# Patient Record
Sex: Male | Born: 1950
Health system: Southern US, Community
[De-identification: ages and names within clinical notes are randomized; demographics above are authoritative.]

## PROBLEM LIST (undated history)

## (undated) DIAGNOSIS — R0600 Dyspnea, unspecified: Secondary | ICD-10-CM

## (undated) DIAGNOSIS — J449 Chronic obstructive pulmonary disease, unspecified: Secondary | ICD-10-CM

## (undated) DIAGNOSIS — E785 Hyperlipidemia, unspecified: Secondary | ICD-10-CM

## (undated) DIAGNOSIS — K432 Incisional hernia without obstruction or gangrene: Secondary | ICD-10-CM

## (undated) DIAGNOSIS — I5030 Unspecified diastolic (congestive) heart failure: Secondary | ICD-10-CM

## (undated) DIAGNOSIS — F419 Anxiety disorder, unspecified: Secondary | ICD-10-CM

## (undated) DIAGNOSIS — M1990 Unspecified osteoarthritis, unspecified site: Secondary | ICD-10-CM

## (undated) DIAGNOSIS — N400 Enlarged prostate without lower urinary tract symptoms: Secondary | ICD-10-CM

## (undated) DIAGNOSIS — I10 Essential (primary) hypertension: Secondary | ICD-10-CM

## (undated) DIAGNOSIS — J9611 Chronic respiratory failure with hypoxia: Secondary | ICD-10-CM

## (undated) DIAGNOSIS — E039 Hypothyroidism, unspecified: Secondary | ICD-10-CM

## (undated) HISTORY — DX: Unspecified osteoarthritis, unspecified site: M19.90

## (undated) HISTORY — PX: TONSILLECTOMY: SUR1361

## (undated) HISTORY — DX: Essential (primary) hypertension: I10

## (undated) HISTORY — DX: Dyspnea, unspecified: R06.00

## (undated) HISTORY — DX: Chronic obstructive pulmonary disease, unspecified: J44.9

## (undated) HISTORY — DX: Hyperlipidemia, unspecified: E78.5

## (undated) HISTORY — PX: ROTATOR CUFF REPAIR: SHX139

## (undated) HISTORY — PX: EXPLORATORY LAPAROTOMY: SUR591

## (undated) HISTORY — DX: Hypothyroidism, unspecified: E03.9

## (undated) HISTORY — DX: Anxiety disorder, unspecified: F41.9

## (undated) HISTORY — DX: Unspecified diastolic (congestive) heart failure: I50.30

## (undated) HISTORY — DX: Benign prostatic hyperplasia without lower urinary tract symptoms: N40.0

## (undated) HISTORY — PX: CARPAL TUNNEL RELEASE: SHX101

## (undated) HISTORY — DX: Chronic respiratory failure with hypoxia: J96.11

---
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
## 2001-01-20 ENCOUNTER — Encounter: Admission: RE | Admit: 2001-01-20 | Discharge: 2001-02-07 | Payer: Self-pay | Admitting: Occupational Medicine

## 2002-04-27 ENCOUNTER — Encounter: Payer: Self-pay | Admitting: Orthopedic Surgery

## 2002-04-27 ENCOUNTER — Ambulatory Visit (HOSPITAL_COMMUNITY): Admission: RE | Admit: 2002-04-27 | Discharge: 2002-04-27 | Payer: Self-pay | Admitting: Orthopedic Surgery

## 2002-05-04 ENCOUNTER — Encounter: Payer: Self-pay | Admitting: Orthopedic Surgery

## 2002-05-10 ENCOUNTER — Observation Stay (HOSPITAL_COMMUNITY): Admission: RE | Admit: 2002-05-10 | Discharge: 2002-05-11 | Payer: Self-pay | Admitting: Orthopedic Surgery

## 2002-05-16 ENCOUNTER — Encounter: Payer: Self-pay | Admitting: Orthopedic Surgery

## 2002-05-16 ENCOUNTER — Ambulatory Visit (HOSPITAL_COMMUNITY): Admission: RE | Admit: 2002-05-16 | Discharge: 2002-05-16 | Payer: Self-pay | Admitting: Orthopedic Surgery

## 2004-08-18 ENCOUNTER — Emergency Department (HOSPITAL_COMMUNITY): Admission: EM | Admit: 2004-08-18 | Discharge: 2004-08-18 | Payer: Self-pay | Admitting: Emergency Medicine

## 2004-08-30 ENCOUNTER — Encounter: Admission: RE | Admit: 2004-08-30 | Discharge: 2004-08-30 | Payer: Self-pay | Admitting: *Deleted

## 2005-02-25 ENCOUNTER — Ambulatory Visit (HOSPITAL_COMMUNITY): Admission: RE | Admit: 2005-02-25 | Discharge: 2005-02-25 | Payer: Self-pay | Admitting: Orthopedic Surgery

## 2005-12-13 HISTORY — PX: TOE SURGERY: SHX1073

## 2006-05-30 ENCOUNTER — Encounter: Admission: RE | Admit: 2006-05-30 | Discharge: 2006-05-30 | Payer: Self-pay | Admitting: Orthopedic Surgery

## 2006-05-31 ENCOUNTER — Ambulatory Visit (HOSPITAL_BASED_OUTPATIENT_CLINIC_OR_DEPARTMENT_OTHER): Admission: RE | Admit: 2006-05-31 | Discharge: 2006-05-31 | Payer: Self-pay | Admitting: Orthopedic Surgery

## 2007-12-10 ENCOUNTER — Emergency Department (HOSPITAL_COMMUNITY): Admission: EM | Admit: 2007-12-10 | Discharge: 2007-12-10 | Payer: Self-pay | Admitting: Emergency Medicine

## 2007-12-14 HISTORY — PX: REPLACEMENT TOTAL KNEE: SUR1224

## 2007-12-29 ENCOUNTER — Ambulatory Visit: Payer: Self-pay | Admitting: Pulmonary Disease

## 2007-12-29 ENCOUNTER — Inpatient Hospital Stay (HOSPITAL_COMMUNITY): Admission: RE | Admit: 2007-12-29 | Discharge: 2008-01-02 | Payer: Self-pay | Admitting: Orthopedic Surgery

## 2008-01-26 ENCOUNTER — Ambulatory Visit: Payer: Self-pay | Admitting: Pulmonary Disease

## 2008-01-26 ENCOUNTER — Telehealth: Payer: Self-pay | Admitting: Pulmonary Disease

## 2008-01-26 DIAGNOSIS — E039 Hypothyroidism, unspecified: Secondary | ICD-10-CM

## 2008-01-26 DIAGNOSIS — J310 Chronic rhinitis: Secondary | ICD-10-CM | POA: Insufficient documentation

## 2008-01-26 DIAGNOSIS — K219 Gastro-esophageal reflux disease without esophagitis: Secondary | ICD-10-CM | POA: Insufficient documentation

## 2008-01-26 DIAGNOSIS — I10 Essential (primary) hypertension: Secondary | ICD-10-CM | POA: Insufficient documentation

## 2008-01-26 DIAGNOSIS — J449 Chronic obstructive pulmonary disease, unspecified: Secondary | ICD-10-CM

## 2008-02-09 ENCOUNTER — Encounter: Payer: Self-pay | Admitting: Pulmonary Disease

## 2008-05-17 ENCOUNTER — Telehealth (INDEPENDENT_AMBULATORY_CARE_PROVIDER_SITE_OTHER): Payer: Self-pay | Admitting: *Deleted

## 2008-08-14 ENCOUNTER — Telehealth: Payer: Self-pay | Admitting: Pulmonary Disease

## 2008-10-21 ENCOUNTER — Telehealth: Payer: Self-pay | Admitting: Pulmonary Disease

## 2008-10-23 ENCOUNTER — Ambulatory Visit: Payer: Self-pay | Admitting: Pulmonary Disease

## 2008-10-23 DIAGNOSIS — F17200 Nicotine dependence, unspecified, uncomplicated: Secondary | ICD-10-CM

## 2008-10-23 DIAGNOSIS — G4733 Obstructive sleep apnea (adult) (pediatric): Secondary | ICD-10-CM

## 2008-11-27 ENCOUNTER — Encounter: Payer: Self-pay | Admitting: Pulmonary Disease

## 2009-08-08 ENCOUNTER — Encounter: Payer: Self-pay | Admitting: Pulmonary Disease

## 2009-08-29 ENCOUNTER — Telehealth (INDEPENDENT_AMBULATORY_CARE_PROVIDER_SITE_OTHER): Payer: Self-pay | Admitting: *Deleted

## 2009-09-10 ENCOUNTER — Ambulatory Visit: Payer: Self-pay | Admitting: Pulmonary Disease

## 2009-09-10 DIAGNOSIS — J9611 Chronic respiratory failure with hypoxia: Secondary | ICD-10-CM

## 2009-12-09 ENCOUNTER — Ambulatory Visit: Payer: Self-pay | Admitting: Internal Medicine

## 2009-12-09 ENCOUNTER — Telehealth (INDEPENDENT_AMBULATORY_CARE_PROVIDER_SITE_OTHER): Payer: Self-pay | Admitting: *Deleted

## 2009-12-10 ENCOUNTER — Encounter: Payer: Self-pay | Admitting: Internal Medicine

## 2009-12-11 ENCOUNTER — Telehealth (INDEPENDENT_AMBULATORY_CARE_PROVIDER_SITE_OTHER): Payer: Self-pay | Admitting: *Deleted

## 2009-12-15 ENCOUNTER — Telehealth: Payer: Self-pay | Admitting: Pulmonary Disease

## 2009-12-16 ENCOUNTER — Ambulatory Visit: Payer: Self-pay | Admitting: Pulmonary Disease

## 2009-12-16 ENCOUNTER — Telehealth (INDEPENDENT_AMBULATORY_CARE_PROVIDER_SITE_OTHER): Payer: Self-pay | Admitting: *Deleted

## 2009-12-17 ENCOUNTER — Encounter: Payer: Self-pay | Admitting: Pulmonary Disease

## 2009-12-24 ENCOUNTER — Ambulatory Visit: Payer: Self-pay | Admitting: Pulmonary Disease

## 2009-12-25 ENCOUNTER — Ambulatory Visit: Payer: Self-pay | Admitting: Cardiology

## 2009-12-25 ENCOUNTER — Ambulatory Visit: Admission: RE | Admit: 2009-12-25 | Discharge: 2009-12-25 | Payer: Self-pay | Admitting: Pulmonary Disease

## 2009-12-25 ENCOUNTER — Encounter: Payer: Self-pay | Admitting: Pulmonary Disease

## 2009-12-26 LAB — CONVERTED CEMR LAB
ALT: 32 units/L (ref 0–53)
AST: 20 units/L (ref 0–37)
Alkaline Phosphatase: 82 units/L (ref 39–117)
Calcium: 9.6 mg/dL (ref 8.4–10.5)
Eosinophils Relative: 1.5 % (ref 0.0–5.0)
GFR calc non Af Amer: 105.78 mL/min (ref >60)
HCT: 47.8 % (ref 39.0–52.0)
Hemoglobin: 15.5 g/dL (ref 13.0–17.0)
Lymphocytes Relative: 24 % (ref 12.0–46.0)
Lymphs Abs: 1.9 10*3/uL (ref 0.7–4.0)
Monocytes Relative: 6.3 % (ref 3.0–12.0)
Neutro Abs: 5.2 10*3/uL (ref 1.4–7.7)
Platelets: 204 10*3/uL (ref 150.0–400.0)
Potassium: 4 meq/L (ref 3.5–5.1)
Sodium: 139 meq/L (ref 135–145)
TSH: 6.88 microintl units/mL — ABNORMAL HIGH (ref 0.35–5.50)
Total Bilirubin: 0.6 mg/dL (ref 0.3–1.2)
WBC: 7.8 10*3/uL (ref 4.5–10.5)

## 2009-12-29 DIAGNOSIS — M1990 Unspecified osteoarthritis, unspecified site: Secondary | ICD-10-CM | POA: Insufficient documentation

## 2009-12-31 ENCOUNTER — Ambulatory Visit: Payer: Self-pay | Admitting: Cardiovascular Disease

## 2009-12-31 ENCOUNTER — Encounter: Payer: Self-pay | Admitting: Adult Health

## 2009-12-31 ENCOUNTER — Ambulatory Visit: Payer: Self-pay

## 2009-12-31 ENCOUNTER — Ambulatory Visit: Payer: Self-pay | Admitting: Internal Medicine

## 2009-12-31 DIAGNOSIS — R002 Palpitations: Secondary | ICD-10-CM | POA: Insufficient documentation

## 2010-01-20 ENCOUNTER — Ambulatory Visit: Payer: Self-pay | Admitting: Pulmonary Disease

## 2010-02-13 ENCOUNTER — Telehealth (INDEPENDENT_AMBULATORY_CARE_PROVIDER_SITE_OTHER): Payer: Self-pay | Admitting: *Deleted

## 2010-02-16 ENCOUNTER — Telehealth (INDEPENDENT_AMBULATORY_CARE_PROVIDER_SITE_OTHER): Payer: Self-pay | Admitting: *Deleted

## 2010-02-16 ENCOUNTER — Encounter (INDEPENDENT_AMBULATORY_CARE_PROVIDER_SITE_OTHER): Payer: Self-pay | Admitting: *Deleted

## 2010-02-16 ENCOUNTER — Ambulatory Visit: Payer: Self-pay | Admitting: Cardiovascular Disease

## 2010-03-03 ENCOUNTER — Ambulatory Visit: Payer: Self-pay | Admitting: Pulmonary Disease

## 2010-03-19 ENCOUNTER — Telehealth (INDEPENDENT_AMBULATORY_CARE_PROVIDER_SITE_OTHER): Payer: Self-pay | Admitting: *Deleted

## 2010-03-26 ENCOUNTER — Encounter (HOSPITAL_COMMUNITY): Admission: RE | Admit: 2010-03-26 | Discharge: 2010-06-24 | Payer: Self-pay | Admitting: Pulmonary Disease

## 2010-04-07 ENCOUNTER — Encounter: Payer: Self-pay | Admitting: Pulmonary Disease

## 2010-04-17 ENCOUNTER — Telehealth (INDEPENDENT_AMBULATORY_CARE_PROVIDER_SITE_OTHER): Payer: Self-pay | Admitting: *Deleted

## 2010-05-04 ENCOUNTER — Ambulatory Visit: Payer: Self-pay | Admitting: Pulmonary Disease

## 2010-05-04 DIAGNOSIS — B370 Candidal stomatitis: Secondary | ICD-10-CM | POA: Insufficient documentation

## 2010-05-14 ENCOUNTER — Telehealth (INDEPENDENT_AMBULATORY_CARE_PROVIDER_SITE_OTHER): Payer: Self-pay | Admitting: *Deleted

## 2010-05-22 ENCOUNTER — Telehealth (INDEPENDENT_AMBULATORY_CARE_PROVIDER_SITE_OTHER): Payer: Self-pay | Admitting: *Deleted

## 2010-05-28 ENCOUNTER — Telehealth (INDEPENDENT_AMBULATORY_CARE_PROVIDER_SITE_OTHER): Payer: Self-pay | Admitting: *Deleted

## 2010-06-10 ENCOUNTER — Telehealth (INDEPENDENT_AMBULATORY_CARE_PROVIDER_SITE_OTHER): Payer: Self-pay | Admitting: *Deleted

## 2010-06-11 ENCOUNTER — Telehealth (INDEPENDENT_AMBULATORY_CARE_PROVIDER_SITE_OTHER): Payer: Self-pay | Admitting: *Deleted

## 2010-06-17 ENCOUNTER — Ambulatory Visit: Payer: Self-pay | Admitting: Pulmonary Disease

## 2010-06-17 ENCOUNTER — Encounter: Payer: Self-pay | Admitting: Adult Health

## 2010-06-23 ENCOUNTER — Encounter: Payer: Self-pay | Admitting: Pulmonary Disease

## 2010-06-25 ENCOUNTER — Telehealth: Payer: Self-pay | Admitting: Pulmonary Disease

## 2010-06-25 ENCOUNTER — Encounter (HOSPITAL_COMMUNITY): Admission: RE | Admit: 2010-06-25 | Discharge: 2010-07-27 | Payer: Self-pay | Admitting: Pulmonary Disease

## 2010-06-25 ENCOUNTER — Encounter: Payer: Self-pay | Admitting: Pulmonary Disease

## 2010-07-03 ENCOUNTER — Encounter: Payer: Self-pay | Admitting: Pulmonary Disease

## 2010-07-06 ENCOUNTER — Ambulatory Visit: Payer: Self-pay | Admitting: Pulmonary Disease

## 2010-07-28 ENCOUNTER — Encounter (HOSPITAL_COMMUNITY): Admission: RE | Admit: 2010-07-28 | Discharge: 2010-09-11 | Payer: Self-pay | Admitting: Pulmonary Disease

## 2010-08-04 ENCOUNTER — Encounter: Payer: Self-pay | Admitting: Pulmonary Disease

## 2010-08-05 ENCOUNTER — Telehealth: Payer: Self-pay | Admitting: Pulmonary Disease

## 2010-08-07 ENCOUNTER — Encounter: Payer: Self-pay | Admitting: Pulmonary Disease

## 2010-08-20 ENCOUNTER — Telehealth (INDEPENDENT_AMBULATORY_CARE_PROVIDER_SITE_OTHER): Payer: Self-pay | Admitting: *Deleted

## 2010-08-21 ENCOUNTER — Ambulatory Visit (HOSPITAL_COMMUNITY)
Admission: RE | Admit: 2010-08-21 | Discharge: 2010-08-21 | Payer: Self-pay | Source: Home / Self Care | Admitting: Orthopedic Surgery

## 2010-08-31 ENCOUNTER — Encounter: Payer: Self-pay | Admitting: Pulmonary Disease

## 2010-09-04 ENCOUNTER — Ambulatory Visit: Payer: Self-pay | Admitting: Pulmonary Disease

## 2010-09-07 ENCOUNTER — Telehealth (INDEPENDENT_AMBULATORY_CARE_PROVIDER_SITE_OTHER): Payer: Self-pay | Admitting: *Deleted

## 2010-09-08 ENCOUNTER — Encounter: Payer: Self-pay | Admitting: Pulmonary Disease

## 2010-09-15 ENCOUNTER — Telehealth (INDEPENDENT_AMBULATORY_CARE_PROVIDER_SITE_OTHER): Payer: Self-pay | Admitting: *Deleted

## 2010-10-08 ENCOUNTER — Telehealth: Payer: Self-pay | Admitting: Pulmonary Disease

## 2010-10-22 ENCOUNTER — Telehealth (INDEPENDENT_AMBULATORY_CARE_PROVIDER_SITE_OTHER): Payer: Self-pay | Admitting: *Deleted

## 2010-10-23 ENCOUNTER — Ambulatory Visit: Payer: Self-pay | Admitting: Pulmonary Disease

## 2010-10-26 ENCOUNTER — Telehealth: Payer: Self-pay | Admitting: Pulmonary Disease

## 2010-12-01 ENCOUNTER — Ambulatory Visit: Payer: Self-pay | Admitting: Pulmonary Disease

## 2010-12-10 ENCOUNTER — Telehealth (INDEPENDENT_AMBULATORY_CARE_PROVIDER_SITE_OTHER): Payer: Self-pay | Admitting: *Deleted

## 2010-12-21 ENCOUNTER — Telehealth (INDEPENDENT_AMBULATORY_CARE_PROVIDER_SITE_OTHER): Payer: Self-pay | Admitting: *Deleted

## 2010-12-22 ENCOUNTER — Ambulatory Visit
Admission: RE | Admit: 2010-12-22 | Discharge: 2010-12-22 | Payer: Self-pay | Source: Home / Self Care | Attending: Adult Health | Admitting: Adult Health

## 2010-12-25 ENCOUNTER — Telehealth (INDEPENDENT_AMBULATORY_CARE_PROVIDER_SITE_OTHER): Payer: Self-pay | Admitting: *Deleted

## 2010-12-25 ENCOUNTER — Ambulatory Visit
Admission: RE | Admit: 2010-12-25 | Discharge: 2010-12-25 | Payer: Self-pay | Source: Home / Self Care | Attending: Pulmonary Disease | Admitting: Pulmonary Disease

## 2010-12-25 ENCOUNTER — Inpatient Hospital Stay (HOSPITAL_COMMUNITY)
Admission: AD | Admit: 2010-12-25 | Discharge: 2010-12-31 | Payer: Self-pay | Source: Home / Self Care | Attending: Pulmonary Disease | Admitting: Pulmonary Disease

## 2010-12-25 DIAGNOSIS — J441 Chronic obstructive pulmonary disease with (acute) exacerbation: Secondary | ICD-10-CM

## 2010-12-28 DIAGNOSIS — G4730 Sleep apnea, unspecified: Secondary | ICD-10-CM

## 2010-12-28 DIAGNOSIS — G4710 Hypersomnia, unspecified: Secondary | ICD-10-CM

## 2010-12-28 LAB — BLOOD GAS, ARTERIAL
Acid-Base Excess: 11 mmol/L — ABNORMAL HIGH (ref 0.0–2.0)
Bicarbonate: 36 mEq/L — ABNORMAL HIGH (ref 20.0–24.0)
Drawn by: 30599
O2 Content: 4 L/min
O2 Saturation: 93.1 %
Patient temperature: 98.6
TCO2: 37.8 mmol/L (ref 0–100)
pCO2 arterial: 57.4 mmHg (ref 35.0–45.0)
pH, Arterial: 7.414 (ref 7.350–7.450)
pO2, Arterial: 63 mmHg — ABNORMAL LOW (ref 80.0–100.0)

## 2010-12-28 LAB — COMPREHENSIVE METABOLIC PANEL
ALT: 27 U/L (ref 0–53)
AST: 19 U/L (ref 0–37)
Albumin: 3.2 g/dL — ABNORMAL LOW (ref 3.5–5.2)
Alkaline Phosphatase: 78 U/L (ref 39–117)
BUN: 7 mg/dL (ref 6–23)
CO2: 33 mEq/L — ABNORMAL HIGH (ref 19–32)
Calcium: 9.3 mg/dL (ref 8.4–10.5)
Chloride: 95 mEq/L — ABNORMAL LOW (ref 96–112)
Creatinine, Ser: 0.61 mg/dL (ref 0.4–1.5)
GFR calc Af Amer: >60 mL/min (ref >60)
GFR calc non Af Amer: >60 mL/min (ref >60)
Glucose, Bld: 140 mg/dL — ABNORMAL HIGH (ref 70–99)
Potassium: 3.7 mEq/L (ref 3.5–5.1)
Sodium: 137 mEq/L (ref 135–145)
Total Bilirubin: 0.4 mg/dL (ref 0.3–1.2)
Total Protein: 6.6 g/dL (ref 6.0–8.3)

## 2010-12-28 LAB — EXPECTORATED SPUTUM ASSESSMENT W GRAM STAIN, RFLX TO RESP C

## 2010-12-28 LAB — CARDIAC PANEL(CRET KIN+CKTOT+MB+TROPI)
CK, MB: 6.7 ng/mL (ref 0.3–4.0)
Relative Index: 6.6 — ABNORMAL HIGH (ref 0.0–2.5)
Total CK: 102 U/L (ref 7–232)
Troponin I: 0.01 ng/mL (ref 0.00–0.06)

## 2010-12-28 LAB — CBC
HCT: 41.9 % (ref 39.0–52.0)
Hemoglobin: 13.6 g/dL (ref 13.0–17.0)
MCH: 31.7 pg (ref 26.0–34.0)
MCHC: 32.5 g/dL (ref 30.0–36.0)
MCV: 97.7 fL (ref 78.0–100.0)
Platelets: 223 10*3/uL (ref 150–400)
RBC: 4.29 MIL/uL (ref 4.22–5.81)
RDW: 13 % (ref 11.5–15.5)
WBC: 6.2 10*3/uL (ref 4.0–10.5)

## 2010-12-28 LAB — URINALYSIS, ROUTINE W REFLEX MICROSCOPIC
Bilirubin Urine: NEGATIVE
Hgb urine dipstick: NEGATIVE
Ketones, ur: NEGATIVE mg/dL
Nitrite: NEGATIVE
Protein, ur: NEGATIVE mg/dL
Specific Gravity, Urine: 1.009 (ref 1.005–1.030)
Urine Glucose, Fasting: NEGATIVE mg/dL
Urobilinogen, UA: 0.2 mg/dL (ref 0.0–1.0)
pH: 6.5 (ref 5.0–8.0)

## 2010-12-28 LAB — DIFFERENTIAL
Basophils Absolute: 0 10*3/uL (ref 0.0–0.1)
Basophils Relative: 1 % (ref 0–1)
Eosinophils Absolute: 0 10*3/uL (ref 0.0–0.7)
Eosinophils Relative: 0 % (ref 0–5)
Lymphocytes Relative: 11 % — ABNORMAL LOW (ref 12–46)
Lymphs Abs: 0.7 10*3/uL (ref 0.7–4.0)
Monocytes Absolute: 0.3 10*3/uL (ref 0.1–1.0)
Monocytes Relative: 5 % (ref 3–12)
Neutro Abs: 5.2 10*3/uL (ref 1.7–7.7)
Neutrophils Relative %: 84 % — ABNORMAL HIGH (ref 43–77)

## 2010-12-28 LAB — BRAIN NATRIURETIC PEPTIDE: Pro B Natriuretic peptide (BNP): <30 pg/mL (ref 0.0–100.0)

## 2010-12-28 LAB — URINE CULTURE
Colony Count: NO GROWTH
Culture  Setup Time: 201201140009
Culture: NO GROWTH
Special Requests: NEGATIVE

## 2010-12-28 LAB — TSH: TSH: 0.138 u[IU]/mL — ABNORMAL LOW (ref 0.350–4.500)

## 2010-12-30 LAB — CULTURE, RESPIRATORY W GRAM STAIN: Culture: NORMAL

## 2010-12-30 LAB — CBC
HCT: 43.4 % (ref 39.0–52.0)
Hemoglobin: 14.2 g/dL (ref 13.0–17.0)
MCH: 31.3 pg (ref 26.0–34.0)
MCHC: 32.7 g/dL (ref 30.0–36.0)
MCV: 95.8 fL (ref 78.0–100.0)
Platelets: 236 10*3/uL (ref 150–400)
RBC: 4.53 MIL/uL (ref 4.22–5.81)
RDW: 13 % (ref 11.5–15.5)
WBC: 10.4 10*3/uL (ref 4.0–10.5)

## 2010-12-30 LAB — BASIC METABOLIC PANEL
BUN: 16 mg/dL (ref 6–23)
CO2: 37 mEq/L — ABNORMAL HIGH (ref 19–32)
Calcium: 9.3 mg/dL (ref 8.4–10.5)
Chloride: 94 mEq/L — ABNORMAL LOW (ref 96–112)
Creatinine, Ser: 0.78 mg/dL (ref 0.4–1.5)
GFR calc Af Amer: >60 mL/min (ref >60)
GFR calc non Af Amer: >60 mL/min (ref >60)
Glucose, Bld: 157 mg/dL — ABNORMAL HIGH (ref 70–99)
Potassium: 4.4 mEq/L (ref 3.5–5.1)
Sodium: 141 mEq/L (ref 135–145)

## 2011-01-01 ENCOUNTER — Telehealth: Payer: Self-pay | Admitting: Pulmonary Disease

## 2011-01-03 ENCOUNTER — Encounter: Payer: Self-pay | Admitting: Orthopedic Surgery

## 2011-01-04 LAB — BASIC METABOLIC PANEL WITH GFR
BUN: 15 mg/dL (ref 6–23)
CO2: 38 meq/L — ABNORMAL HIGH (ref 19–32)
Calcium: 8.8 mg/dL (ref 8.4–10.5)
Chloride: 93 meq/L — ABNORMAL LOW (ref 96–112)
Creatinine, Ser: 0.76 mg/dL (ref 0.4–1.5)
GFR calc non Af Amer: >60 mL/min
Glucose, Bld: 108 mg/dL — ABNORMAL HIGH (ref 70–99)
Potassium: 3.6 meq/L (ref 3.5–5.1)
Sodium: 139 meq/L (ref 135–145)

## 2011-01-04 NOTE — Discharge Summary (Addendum)
[name], [name]                ACCOUNT NO.:  [id number]  MEDICAL RECORD NO.:  [id number]          PATIENT TYPE:  INP  LOCATION:  4710                         FACILITY:  [facility]  PHYSICIAN:  Coralyn Helling, Anthone Prieur        DATE OF BIRTH:  May 13, 1951  DATE OF ADMISSION:  12/25/2010 DATE OF DISCHARGE:  12/31/2010                              DISCHARGE SUMMARY   DISCHARGE DIAGNOSES: 1. Acute exacerbation of chronic obstructive pulmonary disease . 2. Obstructive sleep apnea. 3. Anxiety.  LABORATORY DATA:  December 30, 2010, BMP demonstrates sodium 139, potassium 3.6, chloride 93, CO2 38, glucose 108, BUN 15, creatinine 0.76, and calcium 8.8.  December 28, 2010, CBC demonstrates WBC 10.4, hemoglobin 14.2, hematocrit 43.4, and platelet count 236.  MICRO DATA:  December 26, 2010, respiratory culture demonstrates normal flora.  December 25, 2010, urine culture demonstrates no growth.  RADIOLOGIC DATA:  Admission portable chest and December 29, 2010 two-view of the chest demonstrates no acute findings, chronic peribronchial thickening, and mild hyperinflation suggestive of COPD/emphysema and calcified granulomas.  HISTORY OF PRESENT ILLNESS:  [name] is a 60 year old white male with GOLD stage III COPD with January 2011, FEV-1 of 1.60 which was 57%, severe obstructive sleep apnea on home CPAP, home oxygen dependence and active tobacco abuse.  He was seen in the pulmonary office on December 25, 2010 for work and visit.  He was previously seen on December 22, 2010 with complaints of dyspnea on exertion, wheezing, productive cough, and light yellow mucous with chills and sweats for 3-day history.  He was treated with Avelox and prednisone taper and returned to the office on December 25, 2010 reporting that he was worse with increased wheezing, increased shortness of breath, and productive cough with yellow mucus. On arrival to the office, his oxygen saturation was 88% on 3 L.  It was pumped up to 4 L  and he responded well and came up to 92%.  [name] has never been admitted to the hospital for a COPD exacerbation.  His last hospitalization was in 2009 for a knee replacement.  He was admitted to the medical telemetry, placed on IV steroids and IV antibiotics as well as nebulized bronchodilators.  [name] slowly responded to inpatient therapy and at time of discharge is currently medically stable and reports that he feels much improved.  Respiratory cultures during hospitalization were negative.  Of significant note, during hospitalization, [name] did exhibit anxiety for which he was given Ativan 0.5 b.i.d. with improvement in his anxiousness.  He will continue on this postdischarge.  During hospitalization, he was continued on his home BiPAP and will continue post discharge.  HOSPITAL COURSE BY DISCHARGE DIAGNOSES: 1. Acute exacerbation of COPD.  As per HPI, [name] was admitted on     December 25, 2010 for acute exacerbation of COPD.  He was placed on     IV antibiotics, IV steroids, and nebulized bronchodilators.  At     time of discharge, he has been transitioned to p.o. antibiotics and     p.o. prednisone and is tolerating well.  He has completed  7 days of     antibiotics and will not continue on any further antibiotics at     discharge.  He will complete a prednisone taper, although there is     no bronchospasm at time of discharge on exam. 2. Obstructive sleep apnea.  During hospitalization, [name] was     continued on his home CPAP settings.  He used his home CPAP machine     during the admission.  He will continue on this postdischarge     without changes. 3. Anxiety.  [name] was noted during hospitalization to have     significant anxiety regarding his breathing the amount of oxygen in     his tank, his medication regimen, and several other elements of his     care.  He was given Ativan 0.5 b.i.d. with improvement in his     anxiousness.  He will continue on this  postdischarge.  DISCHARGE INSTRUCTIONS: 1. Activity:  As tolerated. 2. Diet:  No restrictions. 3. Followup:  He is scheduled to follow up with Dr. Coralyn Helling on     January 11, 2011 at 1:45 p.m.  He is instructed to be there at     1:30.  SPECIAL INSTRUCTIONS:  [name] has been instructed to stop smoking.  He says that he only smokes few cigarettes a day, however, did go outside and take off his oxygen to smoke, although he vowed that he will no longer do this.  DISCHARGE MEDICATIONS: 1. Mucinex DM 600-30 mg 1 tablet by mouth twice daily. 2. Lorazepam 0.5 mg by mouth twice daily. 3. Prednisone taper 10 mg tablets, 3 tablets daily for 3 days, then 2     tablets daily for 3 days, and 1 tablet daily for 3 days and stop. 4. Albuterol inhaler 1 puff inhaled every 2 hours. 5. Albuterol nebulizers 1 ampule inhaled every 4 hours. 6. Diovan 160 one tablet by mouth daily. 7. Hydrocodone/APAP 5/325 two tablets by mouth every 4-6 hours as     needed. 8. Lasix 40 mg 1 tablet at breakfast and 1 tablet at noon. 9. Synthroid 200 mcg 1 tablet by mouth every morning. 10.Lipitor 20 mg by mouth daily at bedtime. 11.Loratadine 10 mg by mouth daily as needed. 12.Nabumetone 500 mg 1 tablet mouth daily as needed for knee pain. 13.Nasacort 2 sprays in each nostril twice daily. 14.Nystatin oral suspension 5 mL by mouth as needed for thrush. 15.Ranitidine 150 mg by mouth daily at bedtime. 16.Robaxin 500 mg by mouth twice daily as needed for muscle spasms. 17.Spiriva 18 mcg 1 tablet 2 puffs inhaled by mouth daily. 18.Sulcralfate 1 tablet by mouth 4 times daily. 19.Symbicort 160/4.5 two puffs inhaled twice daily. 20.Tamsulosin 0.4 mg 1 capsule by mouth daily at bedtime. 21.Tramadol 50 mg 2 tablets by mouth every 4 hours as needed for pain. 22.Tylenol Extra Strength 500 mg 1 tablet by mouth every 8 hours as     needed for pain.  DISPOSITION AT TIME OF DISCHARGE:  [name] has met maximum benefit  of inpatient therapy and is currently medically stable and cleared for discharge pending followup with Dr. Coralyn Helling as above.  Again, he has been stressed the importance of no longer smoking as he is close to end- stage pulmonary disease.     Canary Brim, NP   ______________________________ Coralyn Helling, Caeley Dohrmann    BO/MEDQ  D:  12/31/2010  T:  12/31/2010  Job:  [id number]  Electronically Signed by Canary Brim  on 01/04/2011 03:03:11 PM Electronically Signed by Coralyn Helling Erisa Mehlman on 01/04/2011 05:57:13 PM

## 2011-01-05 ENCOUNTER — Telehealth (INDEPENDENT_AMBULATORY_CARE_PROVIDER_SITE_OTHER): Payer: Self-pay | Admitting: *Deleted

## 2011-01-06 ENCOUNTER — Telehealth (INDEPENDENT_AMBULATORY_CARE_PROVIDER_SITE_OTHER): Payer: Self-pay | Admitting: *Deleted

## 2011-01-11 ENCOUNTER — Ambulatory Visit
Admission: RE | Admit: 2011-01-11 | Discharge: 2011-01-11 | Payer: Self-pay | Source: Home / Self Care | Attending: Pulmonary Disease | Admitting: Pulmonary Disease

## 2011-01-11 DIAGNOSIS — F411 Generalized anxiety disorder: Secondary | ICD-10-CM | POA: Insufficient documentation

## 2011-01-12 NOTE — Letter (Signed)
Summary: Out of Work  [employer]  [address]   [place], [place] [address]   Phone: [phone]  Fax: [phone]    January 20, 2010   Employee:  RALPH BROUWER    To Whom It May Concern:   For Medical reasons, please excuse the above named employee from work for the following dates:  Start: 01/20/2010    End:  02/17/2010    If you need additional information, please feel free to contact our office.         Sincerely,    Coralyn Helling, M.D.

## 2011-01-12 NOTE — Letter (Signed)
Summary: Out of Work  [employer]  [address]   [place], [place] [address]   Phone: [phone]  Fax: [phone]    May 04, 2010   Employee:  YOON BARCA    To Whom It May Concern:   For Medical reasons, please excuse the above named employee from work for the following dates:  Start:   05/04/2010  End:   06/16/2010  If you need additional information, please feel free to contact our office.         Sincerely,    Coralyn Helling, M.D.

## 2011-01-12 NOTE — Miscellaneous (Signed)
Summary: Order/Pulmo maintenance program  Order/Pulmo maintenance program   Imported By: Lester [place] 07/07/2010 09:04:07  _____________________________________________________________________  External Attachment:    Type:   Image     Comment:   External Document

## 2011-01-12 NOTE — Progress Notes (Signed)
Summary: rx request/ congestion  Phone Note Call from Patient Call back at Home Phone [phone]   Caller: Patient Call For: sood Summary of Call: pt c/o feeling congested x 1 day- phlegm is milky colored. pt denies fever. was unable to go to rehab today. currently is on 4L of O2. requests [facility]. [facility] [address]. call home # or cell [phone]. pt requests that when rx is called in that you make sure you tell them this rx is for [name].  Initial call taken by: Tivis Ringer, CNA,  May 14, 2010 4:06 PM  Follow-up for Phone Call        Spoke with pt.  He c/o slight increased SOB x 2 days and also prod cough with "milky sputum".  Wants zpack called in.  I advised that VS is out of the office until tommorrow am and that if having increased SOB needs eval.  Appt sched with TP for 05/15/10 at 10:15 am.  Advised ER or UC sooner if needed and pt verbalized understanding. Follow-up by: Vernie Murders,  May 14, 2010 4:13 PM

## 2011-01-12 NOTE — Assessment & Plan Note (Signed)
Summary: acute sick visit for dyspnea   Primary Provider/Referring Provider:  Dr. Gilmore Laroche  CC:  Acute sick visit. Dr. Craige Cotta Patient. Patient c/o increased sob with any type of exertion.Marland Kitchen  History of Present Illness: The pt comes in today for an acute sick visit.  He has been having significant doe over the last few mos, and this has interfered with his ablility to work.  He feels that he is unable to return.  He notes sob with walking thru his house, even with oxygen.  He denies any chest congestion, significant cough, or purulence.  He has not had any chest pain or hemoptysis.  He has not had a recent cardiac w/u, and has not had pfts in "years".  He is not smoking currently.  He was tried on a prednisone taper by Dr. Tanya Nones 2 weeks ago, and did not see a big difference.  Current Medications (verified): 1)  Spiriva Handihaler 18 Mcg  Caps (Tiotropium Bromide Monohydrate) .... Two Puffs in Handihaler Daily 2)  Symbicort 160-4.5 Mcg/act  Aero (Budesonide-Formoterol Fumarate) .... Two Puffs Twice Daily 3)  Ventolin Hfa 108 (90 Base) Mcg/act  Aers (Albuterol Sulfate) .Marland Kitchen.. 1-2 Puffs Every 4-6 Hours As Needed 4)  Albuterol Sulfate (2.5 Mg/46ml) 0.083%  Nebu (Albuterol Sulfate) .... Inhale 1 Vial [address] Every 4 Hrs As Needed 5)  Allegra 180 Mg  Tabs (Fexofenadine Hcl) .... Take 1 Tablet By Mouth Once A Day 6)  Nasacort Aq 55 Mcg/act  Aers (Triamcinolone Acetonide(Nasal)) .... Take 2 Sprays in Each Nostril Once A Day 7)  Ranitidine Hcl 75 Mg  Tabs (Ranitidine Hcl) .... Take 2 Tablets By Mouth At Bedtime 8)  Lasix 40 Mg  Tabs (Furosemide) .... Take 1/2  Tablet By Mouth Two Times A Day 9)  Lipitor 20 Mg Tabs (Atorvastatin Calcium) .Marland Kitchen.. 1 By Mouth Daily 10)  Levothyroxine Sodium 200 Mcg  Solr (Levothyroxine Sodium) .... Take 1 Tablet By Mouth Once A Day 11)  Flomax 0.4 Mg Xr24h-Cap (Tamsulosin Hcl) .Marland Kitchen.. 1 By Mouth Daily 12)  Tramadol Hcl 50 Mg Tabs (Tramadol Hcl) .... As Needed 13)  Robaxin 500 Mg  Tabs (Methocarbamol) .Marland Kitchen.. 1 By Mouth Two Times A Day 14)  Diovan 160 Mg  Tabs (Valsartan) .... One By Mouth Daily 15)  Extra Strength Pain Reliever 500 Mg Tabs (Acetaminophen) .... As Needed  Allergies (verified): 1)  ! Pcn  Review of Systems       The patient complains of shortness of breath with activity, shortness of breath at rest, non-productive cough, and hand/feet swelling.  The patient denies productive cough, coughing up blood, chest pain, irregular heartbeats, acid heartburn, indigestion, loss of appetite, weight change, abdominal pain, difficulty swallowing, sore throat, tooth/dental problems, headaches, nasal congestion/difficulty breathing through nose, sneezing, itching, ear ache, anxiety, depression, joint stiffness or pain, rash, change in color of mucus, and fever.    Vital Signs:  Patient profile:   60 year old male Height:      67 inches (170.18 cm) Weight:      230 pounds (104.55 kg) BMI:     36.15 O2 Sat:      96 % on 2.5 L/min Temp:     98.0 degrees F (36.67 degrees C) oral Pulse rate:   103 / minute BP sitting:   110 / 76  (left arm) Cuff size:   large  Vitals Entered By: Michel Bickers CMA (December 16, 2009 11:49 AM)  O2 Flow:  2.5 L/min  Physical  Exam  General:  obese male in nad Nose:  no skin breakdown or pressure necrosis from cpap mask Lungs:  decreased bs throughout, no wheezing or rhonchi Heart:  rrr, no mrg Extremities:  mild ankle edema, no cyanosis Neurologic:  alert and oriented, moves all 4.   Impression & Recommendations:  Problem # 1:  DYSPNEA (ICD-786.05) The pt has significant doe that he feels is not getting better, and is preventing him from working.  He has known copd, but does not have any findings today to suggest acute exacerbation or pulmonary infection.  He is obese, deconditioned, and may have underlying lung disease.  At this point, would like to check full pfts to compare to those in the past.  If not a big change, may need to  consider cardiac w/u to exclude superimposed disease.  The pt will f/u with his primary pulmonologist, Dr. Craige Cotta, to discuss these findings.  Medications Added to Medication List This Visit: 1)  Extra Strength Pain Reliever 500 Mg Tabs (Acetaminophen) .... As needed  Other Orders: Est. Patient Level IV ([address])  Patient Instructions: 1)  stay on oxygen and  current meds 2)  will schedule for breathing tests and followup with Dr. Craige Cotta on same day

## 2011-01-12 NOTE — Progress Notes (Signed)
  Phone Note From Other Clinic   Caller: [facility] Summary of Call: Received request to update script for liquid oxygen from DME.  He is on 2 liters oxygen at rest and 3 liters with exertion and sleep.  Will send order through [facility]. Initial call taken by: Coralyn Helling MD,  June 25, 2010 2:06 PM

## 2011-01-12 NOTE — Progress Notes (Signed)
Summary: nos appt  Phone Note Call from Patient   Caller: [email or web address]  Call For: sood Summary of Call: Rsc nos from 8/23 to 9/23 @ 1:45p, pt states his appt card had 9/23 @ 1:45p written on it. Initial call taken by: Darletta Moll,  August 05, 2010 9:52 AM
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
## 2011-01-12 NOTE — Miscellaneous (Signed)
Summary: Discharge/[facility]  Discharge/[facility]   Imported By: Sherian Rein 09/14/2010 07:17:27  _____________________________________________________________________  External Attachment:    Type:   Image     Comment:   External Document
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
## 2011-01-12 NOTE — Progress Notes (Signed)
Summary: appt  Phone Note Call from Patient Call back at [phone]   Caller: Patient Summary of Call: Has appt on 7/25, wants to know if he can be seen next week, pls advise. Initial call taken by: Darletta Moll,  June 11, 2010 8:45 AM  Follow-up for Phone Call        Patient cancelled appt with Dr. Craige Cotta on June 29th. There are no available openings next week. The first available will be on July 20th with Dr. Craige Cotta.  Patient may sch with TP if needing appt next week.[email or web address].Michel Bickers CMA  June 11, 2010 9:04 AM  Returning call. Darletta Moll  June 11, 2010 9:07 AM   Additional Follow-up for Phone Call Additional follow up Details #1::        The patient is sch to see TP on Wed., 06/17/2010 for follow-up.Michel Bickers CMA  June 11, 2010 9:15 AM

## 2011-01-12 NOTE — Progress Notes (Signed)
Summary: return to work  Phone Note Call from Patient Call back at [employer] [phone] Call back at [phone]   Caller: Patient Call For: Ahja Martello Summary of Call: Was given a note to return to work today, still has sob when he moves around, wants to know if he should stay out an additional week, pls. advise. Initial call taken by: Darletta Moll,  December 15, 2009 11:27 AM  Follow-up for Phone Call        (VS pt)-Pt was seen by [facility] 12/09/2009 for S.O.B and due to return to work today. Pt states he has followed MW recs and is still S.O.B with activity. 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 Pt does not feel he will be able to perform his work duties. Pt uses 2.5 liters O2 when home and with cpap. Pt requesting additional days out of work. I informed pt VS out of the office all day and MW out until this PM, and will send to "doc of the day" Please advise if pt needs to come in for OV. Thanks. Zackery Barefoot CMA  December 15, 2009 12:11 PM   Additional Follow-up for Phone Call Additional follow up Details #1::        Stay out of work until seen by a provider  Pt will need another OV  ASAP Additional Follow-up by: Storm Frisk MD,  December 15, 2009 12:26 PM    Additional Follow-up for Phone Call Additional follow up Details #2::    Pt advised of above and scheduled with KC in the AM. I offered pt PM appointment today and pt declined. Zackery Barefoot CMA  December 15, 2009 12:33 PM
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
## 2011-01-12 NOTE — Progress Notes (Signed)
Summary: needs work note  Phone Note Call from Patient Call back at [employer] [phone]   Caller: Patient Call For: clance Summary of Call: Needs a work note. He got one today when he was seen, but on the work note it has starting date of  1/4. He needs a work note that says 1/3 (yesterday). He couldnt get in to see dr clance yesterday when he needed to. Please give him a call when possible. Initial call taken by: Valinda Hoar,  December 16, 2009 1:22 PM  Follow-up for Phone Call        Will forward message to [facility] to address.  Marland KitchenArman Filter LPN  December 16, 2009 2:05 PM   Additional Follow-up for Phone Call Additional follow up Details #1::        the release can be from 1/3 to 1/17. Additional Follow-up by: Barbaraann Share MD,  December 16, 2009 5:30 PM    Additional Follow-up for Phone Call Additional follow up Details #2::    Patient wants the work note faxed to his home number and is aware we will fax this to him today. Michel Bickers CMA  December 17, 2009 8:28 AM   Note has been faxed to the pt's home number and will be scanned into the pt's chart. Michel Bickers CMA  December 17, 2009 1:51 PM
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
## 2011-01-12 NOTE — Letter (Signed)
Summary: Return To Work  [facility], Main Office  [address]   [place], [place] [address]   Phone: [phone]  Fax: [phone]    02/16/2010  TO: WHOM IT MAY CONCERN   RE: [name] [address] [place]   The above named individual is under my medical care and may not return to work until seen on March 22nd by Dr Craige Cotta.  If you have any further questions or need additional information, please call.     Sincerely,    Deliah Goody, RN/Dr Charlton Haws

## 2011-01-12 NOTE — Miscellaneous (Signed)
Summary: Pulmonary/[place]  [facility]/[place]   Imported By: Sherian Rein 07/03/2010 10:24:17  _____________________________________________________________________  External Attachment:    Type:   Image     Comment:   External Document

## 2011-01-12 NOTE — Progress Notes (Signed)
Summary: talk to pcc  Phone Note Call from Patient Call back at Home Phone [phone] Call back at [phone]   Caller: Patient Call For: sood Summary of Call: Pt states he wants all his home health care supplies transferred to [facility], wants to speak with pcc in ref to this. Initial call taken by: Darletta Moll,  September 15, 2010 1:50 PM  Follow-up for Phone Call        pt states he currently uses [facility] for his supplies and would like everything ( oxygen, cpap, nebs)  transferred to [facility] Patient.  Will forward message to [facility] to address.  Aundra Millet Reynolds LPN  September 15, 2010 3:54 PM   Additional Follow-up for Phone Call Additional follow up Details #1::        order to [facility] to set up 02 3lpm 24/7 nad auto cpap with download and cpa supplies order faxed to [facility] to dc services pt aware  Additional Follow-up by: Oneita Jolly,  September 16, 2010 10:40 AM

## 2011-01-12 NOTE — Letter (Signed)
Summary: Out of Work  [facility]  [address]   [place], [place] [address]   Phone: [phone]  Fax: [phone]    July 06, 2010   Employee:  EMILIANO WELSHANS    To Whom It May Concern:   For Medical reasons, please excuse the above named employee from work for the following dates:  Start:   July 06, 2010  End:   September 06, 2010  If you need additional information, please feel free to contact our office.         Sincerely,    Coralyn Helling, MD

## 2011-01-12 NOTE — Letter (Signed)
Summary: Out of Work  [facility]  [address]   [place], [place] [address]   Phone: [phone]  Fax: [phone]    December 31, 2009   Employee:  BRECKYN TICAS    To Whom It May Concern:   For Medical reasons, please excuse the above named employee from work for the following dates:  Start:   December 01, 2009  End:   January 21, 2010  If you need additional information, please feel free to contact our office.         Sincerely,       Tammy Parrett, N.P.
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
## 2011-01-12 NOTE — Progress Notes (Signed)
Summary: PULM REHAB  Phone Note Call from Patient Call back at [facility] Phone [phone]   Caller: Patient Call For: SOOD Summary of Call: PT STILL WAITING TO HEAR FROM PULM REHAB.  Initial call taken by: Tivis Ringer, CNA,  March 19, 2010 3:56 PM  Follow-up for Phone Call        pt aware that pul rehab will call them and it could be 2-3 wks  Follow-up by: Oneita Jolly,  March 19, 2010 4:04 PM

## 2011-01-12 NOTE — Progress Notes (Signed)
Summary: medication question  Phone Note Call from Patient Call back at [phone]   Caller: Patient Call For: sood Summary of Call: Pt states he was given a new rx by his orthopedist called nabumetone 500mg , and wants to know if it's ok to take with his breathing medication. Initial call taken by: Darletta Moll,  October 08, 2010 1:39 PM  Follow-up for Phone Call        Spoke with pt.  He states that he was started on nabumetone 500 mg two times a day and he wants to make VS awaer of this and be sure that med will be okay to take with other meds.  Pls advise thanks Follow-up by: Vernie Murders,  October 08, 2010 2:22 PM  Additional Follow-up for Phone Call Additional follow up Details #1::        Please inform him that nabumetone should not have any interactions with his current inhaler regimen. Additional Follow-up by: Coralyn Helling MD,  October 09, 2010 10:04 AM    Additional Follow-up for Phone Call Additional follow up Details #2::    pt advised med ok. [name] CMA  October 09, 2010 10:08 AM

## 2011-01-12 NOTE — Medication Information (Signed)
Summary: Order for Oxygen/[facility]  Order for Oxygen/[facility]   Imported By: Sherian Rein 06/29/2010 11:06:00  _____________________________________________________________________  External Attachment:    Type:   Image     Comment:   External Document

## 2011-01-12 NOTE — Progress Notes (Signed)
Summary: rx correction req  Phone Note Call from Patient Call back at Home Phone [phone]   Caller: Patient Call For: sood Summary of Call: pt states that he needs his rx for albuterol neb "corrected" as he usually gets 5 boxes with 5 packs for a total of 125. [facility] [address]. call pt on cell [phone] -1st- or home #  Initial call taken by: Tivis Ringer, CNA,  August 20, 2010 11:25 AM  Follow-up for Phone Call        Called and spoke with pt.  He states that normally he gets 5 boxes of his albuterol neb sol and this time only got 4.  I called the pharmacist and okayed another box.  She states that a new rx would need to be submitted since pt already picked up and paid for rx. New rx was sent.  Spoke with pt and advised this was done. Follow-up by: Vernie Murders,  August 20, 2010 2:09 PM    New/Updated Medications: ALBUTEROL SULFATE (2.5 MG/3ML) 0.083%  NEBU (ALBUTEROL SULFATE) Inhale 1 vial via HHN every 4 hrs as needed Prescriptions: ALBUTEROL SULFATE (2.5 MG/3ML) 0.083%  NEBU (ALBUTEROL SULFATE) Inhale 1 vial via HHN every 4 hrs as needed  #150 x 3   Entered by:   Vernie Murders   Authorized by:   Coralyn Helling MD   Signed by:   Vernie Murders on 08/20/2010   Method used:   [facility]        [facility]  [address] [phone][facility] (retail)       [address]       [place], [place]  [address]       Ph: [phone]       Fax: [phone]   RxID:   [id number]

## 2011-01-12 NOTE — Letter (Signed)
Summary: Out of Work  [facility]  [address]   [place], [place] [address]   Phone: [phone]  Fax: [phone]    June 17, 2010   Employee:  EDITH LORD    To Whom It May Concern:   For Medical reasons, please excuse the above named employee from work for the following dates:  Start:   Tuesday June 16, 2010  End:   Monday July 06, 2010  If you need additional information, please feel free to contact our office.         Sincerely,       Tammy Parrett, N.P.

## 2011-01-12 NOTE — Letter (Signed)
Summary: Internal Correspondence  Internal Correspondence   Imported By: Valinda Hoar 12/17/2009 14:02:50  _____________________________________________________________________  External Attachment:    Type:   Image     Comment:   External Document

## 2011-01-12 NOTE — Progress Notes (Signed)
Summary: wants to switch to liquid o2  Phone Note Call from Patient   Caller: Patient Call For: sood Summary of Call: pt have questions about liquid air and concentrated air. Initial call taken by: Rickard Patience,  September 07, 2010 2:41 PM  Follow-up for Phone Call        Called and spoke with pt.  He states that he would like to switch from o2 concentrator to just liquid o2.  He states that he feels like he does better with his breathing while on liquid o2.  He uses [employer].  Please advise thanks Follow-up by: Vernie Murders,  September 07, 2010 3:51 PM  Additional Follow-up for Phone Call Additional follow up Details #1::        That is fine.  please have [facility] put the order in for liquid oxygen.  Additional Follow-up by: Coralyn Helling MD,  September 08, 2010 9:06 AM    Additional Follow-up for Phone Call Additional follow up Details #2::    Order was sent to [facility], spoke with pt and made aware this was done. Follow-up by: Vernie Murders,  September 08, 2010 9:40 AM

## 2011-01-12 NOTE — Progress Notes (Signed)
Summary: letter  Phone Note Call from Patient   Caller: Patient Call For: sood Summary of Call: pt would like to pick letter up monday- or  asap- when dr soon can sign this.  (per lori this has come back from dictation ready to be signed by vs). call pt (when he can pick this up) at home or cell [phone] Initial call taken by: Tivis Ringer, CNA,  May 28, 2010 4:36 PM  Follow-up for Phone Call        The patient is aware letter is signed and ready for pick up. He will come by today and this up at the front office. Follow-up by: Michel Bickers CMA,  June 01, 2010 9:03 AM

## 2011-01-12 NOTE — Miscellaneous (Signed)
Summary: Report/[facility]  Report/[facility]   Imported By: Lester [place] 09/17/2010 10:13:57  _____________________________________________________________________  External Attachment:    Type:   Image     Comment:   External Document

## 2011-01-12 NOTE — Letter (Signed)
Summary: CMN for Oxygen/[facility]  CMN for Oxygen/[facility]   Imported By: Sherian Rein 04/14/2010 09:07:19  _____________________________________________________________________  External Attachment:    Type:   Image     Comment:   External Document

## 2011-01-12 NOTE — Letter (Signed)
Summary: Out of Work  [facility]  [address]   [place], [place] [address]   Phone: [phone]  Fax: [phone]    March 03, 2010   Employee:  DOLTON SHAKER    To Whom It May Concern:   For Medical reasons, please excuse the above named employee from work for the following dates:  Start:   03/03/2010  End:   05/04/2010  If you need additional information, please feel free to contact our office.         Sincerely,    Coralyn Helling, M. D.
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
## 2011-01-12 NOTE — Progress Notes (Signed)
  Phone Note Outgoing Call   Call placed by: Deliah Goody, RN,  February 13, 2010 2:26 PM Summary of Call: monitor reviewed by dr Eden Emms, sinus rhythm, no sig arrythmia Left message to call back Deliah Goody, RN  February 13, 2010 2:27 PM      Appended Document:  discussed at office visit

## 2011-01-12 NOTE — Progress Notes (Signed)
Summary: ? ABOUT MEDICATION  Phone Note Call from Patient Call back at [phone]   Caller: Patient Call For: SOOD Summary of Call: PT WANT TO KNOW IF HE IS SAFE TO TAKE DIAZAPAM ? Initial call taken by: Rickard Patience,  February 13, 2010 2:59 PM  Follow-up for Phone Call        Spoke with pt.  He states that he had panic attack when had MRI done.  They had to stop the test.  His PCP has called him 1 diazepam to take before he tries MRI again.  Wants to make sure this is okay with VS.  I spoke with [facility] and he states that this is fine.  I called pt back and made him aware. Follow-up by: Vernie Murders,  February 13, 2010 5:13 PM

## 2011-01-12 NOTE — Progress Notes (Signed)
Summary: prescript  Phone Note Call from Patient Call back at [phone]   Caller: Patient Call For: sood Summary of Call: need prescript for pul oximetry to check oxygen Initial call taken by: Rickard Patience,  Apr 17, 2010 10:48 AM  Follow-up for Phone Call        Pt found out that pulse ox machine will not be covered by insurance.  Pt is going to order one online and will bring it by our office to double check the readings with our machine, Abigail Miyamoto RN  Apr 17, 2010 11:07 AM
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
## 2011-01-12 NOTE — Miscellaneous (Signed)
Summary: Orders Update pft charges  Clinical Lists Changes  Orders: Added new Service order of Carbon Monoxide diffusing w/capacity (94720) - Signed Added new Service order of Lung Volumes (94240) - Signed Added new Service order of Spirometry (Pre & Post) (94060) - Signed 

## 2011-01-12 NOTE — Letter (Signed)
Summary: [facility] [employer] [facility]  [address]   [place], [place] [address]   Phone: [phone]  Fax: [phone]    09/04/2010  [name] [address] [place], [place]  [address]  TO WHOM IT MAY CONCERN:  The above mentioned patient has chronic respiratory disease that is stable on medication. Patient is clear to return to work September 07, 2010. If you have any questions or concerns please feel free to call my office at [phone].           Sincerely,     Coralyn Helling, MD

## 2011-01-12 NOTE — Assessment & Plan Note (Signed)
Summary: SOB- req pm appt- VS's [name] ///kp   Visit Type:  Sick Visit  Copy to:  Charlton Haws, Dr. Ranell Patrick, Dr. Charlann Boxer Primary Provider/Referring Provider:  Dr. Gilmore Laroche  CC:  [name] of VS. [name] c/o increased SOB and productive cough with clear to light yellow mucus.  History of Present Illness: 60 yo male with GOLD 3 COPD, hypoxemia, severe OSA on CPAP, and tobacco abuse.   His knee has been bothering him.  He is being evaluated by Dr. Ranell Patrick and Dr. Charlann Boxer for possible knee replacement.  He has cut down on his smoking, but is still smoking a few cigarettes per day.  He still gets some chest congestion and cough with clear sputum.  He denies chest pain, fever, or hemoptysis.  He is not getting much leg swelling.  He gets winded with exertion, but does okay at rest.  October 23, 2010 2:24 PM   4 ds of of URI, followed by increased cough , white - yellow phlegm & wheezing Daughter had sugery & he has been taking care of her. Increased pedal edema, L >> R  Preventive Screening-Counseling & Management  Alcohol-Tobacco     Smoking Status: current     Packs/Day: <0.25  Current Medications (verified): 1)  Symbicort 160-4.5 Mcg/act  Aero (Budesonide-Formoterol Fumarate) .... Two Puffs Twice Daily 2)  Spiriva Handihaler 18 Mcg  Caps (Tiotropium Bromide Monohydrate) .... Two Puffs in Handihaler Daily 3)  Ventolin Hfa 108 (90 Base) Mcg/act  Aers (Albuterol Sulfate) .... 2 Puffs Every 4 Hours As Needed 4)  Albuterol Sulfate (2.5 Mg/100ml) 0.083%  Nebu (Albuterol Sulfate) .... Inhale 1 Vial Via Hhn Every 4 Hrs As Needed 5)  Nasacort Aq 55 Mcg/act  Aers (Triamcinolone Acetonide(Nasal)) .... Take 2 Sprays in Each Nostril Twice A Day 6)  Lipitor 20 Mg Tabs (Atorvastatin Calcium) .... Take 1 Tab By Mouth At Bedtime 7)  Tamsulosin Hcl 0.4 Mg Caps (Tamsulosin Hcl) .... Take 1 Capsule By Mouth At Bedtime 8)  Lasix 40 Mg  Tabs (Furosemide) .... 1/2 Tab Morning and 1 Tab Noon 9)  Diovan 160 Mg  Tabs (Valsartan)  .... One By Mouth Daily 10)  Levothyroxine Sodium 200 Mcg  Solr (Levothyroxine Sodium) .... Take 1 Tablet By Mouth Once A Day 11)  Ranitidine Hcl 150 Mg Caps (Ranitidine Hcl) .... Take 1 Capsule By Mouth At Bedtime 12)  Clotrimazole-Betamethasone 1-0.05 % Crea (Clotrimazole-Betamethasone) .... Apply  Every Morning and At Bedtime 13)  Sucralfate 1 Gm Tabs (Sucralfate) .... Take 1 Tablet By Mouth Four Times A Day 14)  Oxygen 3l/min .... Wear Continuously 15)  Cpap .... Wear At Bedtime 16)  Loratadine 10 Mg Tabs (Loratadine) .... One By Mouth Once Daily As Needed 17)  Tramadol Hcl 50 Mg Tabs (Tramadol Hcl) .Marland Kitchen.. 1 Tab By Mouth Every 4 Hours As Needed 18)  Robaxin 500 Mg Tabs (Methocarbamol) .Marland Kitchen.. 1 Tab By Mouth Every 6 Hours As Needed 19)  Extra Strength Pain Reliever 500 Mg Tabs (Acetaminophen) .... Per Bottle 20)  Nystatin 100000 Unit/ml Susp (Nystatin) .... 5 Ml Four Times Per Day Swish and Swallow 21)  Hydrocodone-Acetaminophen 5-325 Mg Tabs (Hydrocodone-Acetaminophen) .Marland Kitchen.. 1 By Mouth As Directed For Shoulder Pain 22)  Nabumetone 500 Mg Tabs (Nabumetone) .... Take 1 Tablet By Mouth Two Times A Day For Right Knee Pain  Allergies (verified): 1)  ! Pcn  Past History:  Past Medical History: Last updated: 09/04/2010 Current Problems:  OSTEOARTHRITIS (ICD-715.90) CHEST PAIN UNSPECIFIED (ICD-786.50) HYPOXEMIA (ICD-799.02)      -  2 liters at rest, 3 liters with exertion/sleep TOBACCO ABUSE (ICD-305.1)      - Quit May 2011 CHRONIC RHINITIS (ICD-472.0) SEVERE OBSTRUCTIVE SLEEP APNEA (ICD-327.23)       - auto CPAP  HYPOTHYROIDISM (ICD-244.9) HYPERTENSION (ICD-401.9) G E R D (ICD-530.81) GOLD 3 C O P D (ICD-496)  - PFT 12/24/09>>FEV1 1.60(57%), FEV1% 52, TLC 6.86(130%), DLCO 67%, no BD  Social History: Last updated: 12/31/2009 Patient is a current smoker. Has cut back, currently smoking 1-3 cigs daily.  Smoked x 40 yrs upto 3ppd. [name] is married with children. 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  Social History: Packs/Day:  <0.25  Review of Systems       The patient complains of dyspnea on exertion and peripheral edema.  The patient denies anorexia, fever, weight loss, weight gain, vision loss, decreased hearing, hoarseness, chest pain, syncope, prolonged cough, headaches, hemoptysis, abdominal pain, melena, hematochezia, severe indigestion/heartburn, hematuria, muscle weakness, suspicious skin lesions, transient blindness, difficulty walking, depression, unusual weight change, abnormal bleeding, enlarged lymph nodes, and angioedema.    Vital Signs:  Patient profile:   60 year old male Height:      64 inches Weight:      228 pounds O2 Sat:      95 % on 4 L/min pulsed Temp:     98.5 degrees F oral Pulse rate:   95 / minute BP sitting:   118 / 74  (left arm) Cuff size:   large  Vitals Entered By: Zackery Barefoot CMA (October 23, 2010 2:13 PM)  O2 Flow:  4 L/min pulsed CC: [name] of VS. [name] c/o increased SOB, productive cough with clear to light yellow mucus Comments Medications reviewed with patient Verified contact number and pharmacy with patient Zackery Barefoot [facility]  October 23, 2010 2:14 PM    Physical Exam  Additional Exam:  wt 227 > 232 December 09, 2009 >>229 12/31/09>>223 June 17, 2010   228 October 23, 2010  amb hoarse white male nad HEENT mild turbinate edema.  Oropharynx no thrush or excess pnd or cobblestoning.  No JVD or cervical adenopathy. Mild accessory muscle hypertrophy. Trachea midline, nl thryroid. Chest was hyperinflated by percussion with diminished breath sounds and moderate increased exp time without wheeze. Hoover sign positive at mid inspiration. Regular rate and rhythm without murmur gallop or rub or increase P2 or edema.  Abd: no hsm, nl excursion. Ext warm without cyanosis or clubbing.     CXR  Procedure date:  10/23/2010  Findings:      IMPRESSION:   1.  Stable (right greater than left) bilateral linear scarring and old  granulomatous disease. 2.  Stable slight generalized chronic asthma or bronchitis. 3.  No acute findings.  Impression & Recommendations:  Problem # 1:  CHRONIC OBSTRUCTIVE PULMONARY DISEASE, ACUTE EXACERBATION (ICD-491.21)  Z-pak Prednisone x 2 weeks - take as directed Increase lasix to 40 two times a day - if wt increases to 230 lbs or if greater swelling  Orders: Est. Patient Level III ([address]) Prescription Created Electronically [phone])  Medications Added to Medication List This Visit: 1)  Nabumetone 500 Mg Tabs (Nabumetone) .... Take 1 tablet by mouth two times a day for right knee pain 2)  Prednisone 10 Mg Tabs (Prednisone) .... Take 4 tabs  daily with food x 2 days, then 3 tabs daily x 2 days, then 2 tabs daily x 4 days, then 1 tab daily x4 days then stop. #40 3)  Azithromycin 500 Mg Tabs (  Azithromycin) .... Once daily  Other Orders: T-2 View CXR (71020TC)  Patient Instructions: 1)  A chest x-ray has been recommended.  Your imaging study may require preauthorization.  2)  Z-pak 3)  Prednisone x 2 weeks 4)  Increase lasix to 40 two times a day - if wt increases to 230 lbs or if greater swelling 5)  Keep appt with Dr Craige Cotta Prescriptions: AZITHROMYCIN 500 MG TABS (AZITHROMYCIN) once daily  #5 x 0   Entered and Authorized by:   Comer Locket Vassie Loll MD   Signed by:   Comer Locket Vassie Loll MD on 10/23/2010   Method used:   [facility] to        [facility] [address] [id number]       [address]       [place], [place]  [address]       Ph: [phone]       Fax: [phone]   RxID:   [id number] PREDNISONE 10 MG TABS (PREDNISONE) Take 4 tabs  daily with food x 2 days, then 3 tabs daily x 2 days, then 2 tabs daily x 4 days, then 1 tab daily x4 days then stop. #40  #40 x 0   Entered and Authorized by:   Comer Locket Vassie Loll MD   Signed by:   Comer Locket Vassie Loll MD on 10/23/2010   Method used:   [facility] to        [facility] [facility] [address] [id number]       [address]       [place], [place]  [address]       Ph: [phone]       Fax: [phone]   RxID:   [id number]

## 2011-01-12 NOTE — Progress Notes (Signed)
Summary: letter  Phone Note Call from Patient Call back at Home Phone [phone] Call back at [phone] cell   Caller: Patient Call For: sood Reason for Call: Talk to Nurse Summary of Call: Pulmonary rehab has told pt he will need full 36 weeks of rehab.  His insurance won't pay.  He is suppose to go back to work(FMLA runs out) before his next appt w/Sood.  His medical dept at work says if he gets a letter from Dr. Craige Cotta stating that his COPD is catastrophic, they could extend his time to cover the last 16 weeks of rehab(he'll have to pay out of pocket).  Then he will get paid until he knows whether he can go back to work. Initial call taken by: Eugene Gavia,  May 22, 2010 10:39 AM  Follow-up for Phone Call        Spoke with pt.  Pt states FLMA ends on June 26th but next scheduled ov with VS is not until June 29th.  Pt states at this point he does not feel like he is ready to go back to work after only 20 visits to pulmonary rehab.  States he feels like he could benefit from taking the rest of the 16 visits at pulmonary rehab before going back to work.  Pt is requesting that VS write a letter stating he could benefit from the additional 15 sessions and request to extend his FMLA until Sept 25 and report back to work on Sept 26.   Pt also requesting the letter to include that his COPD is "catastrophic" (requesting the stage).  Pt requesting to pick this letter up if ok with VS.  Pt informed VS is out of office until Monday-he is ok with this.  Will forward message to VS-pls advise.  Thanks!  Follow-up by: Gweneth Dimitri RN,  May 22, 2010 11:13 AM  Additional Follow-up for Phone Call Additional follow up Details #1::        pt called back to check on this matter.  Let Lawson Fiscal know and she said she would make VS aware. Additional Follow-up by: Eugene Gavia,  May 26, 2010 3:59 PM    Additional Follow-up for Phone Call Additional follow up Details #2::    Letter dictated.  Will sign when  transcribed. Follow-up by: Coralyn Helling MD,  May 26, 2010 6:40 PM

## 2011-01-12 NOTE — Progress Notes (Signed)
Summary: sob  Phone Note Call from Patient Call back at [phone]   Caller: Patient Call For: sood Summary of Call: pt sob would like something called to [facility] [address] Initial call taken by: Rickard Patience,  October 22, 2010 4:48 PM  Follow-up for Phone Call        Spoke with pt. He c/o chest congestion, cough with clear sputum and increased SOB.  VS out of the office- OV sched with MW for 10:20 am tommorrow, advised that he report to ER sooner if needed.  Pt verbalized understanding. Follow-up by: Vernie Murders,  October 22, 2010 5:01 PM

## 2011-01-12 NOTE — Miscellaneous (Signed)
Summary: [facility]  [facility]   Imported By: Lennie Odor 06/29/2010 16:14:31  _____________________________________________________________________  External Attachment:    Type:   Image     Comment:   External Document

## 2011-01-12 NOTE — Progress Notes (Signed)
Summary: WORK NOTE  Phone Note Call from Patient Call back at [phone]   Caller: Patient Call For: SOOD Summary of Call: NEED WORK NOTE EXTENDED UNTIL DR SOOD COME BACK IN TOWN. Initial call taken by: Rickard Patience,  February 16, 2010 1:41 PM  Follow-up for Phone Call        The patient was seen by Dr. Eden Emms earlier today and was told his heart was not the issue with the SOB. Dr. Craige Cotta is out of the [place] and cannot be contacted regarding the extension for being out of work. I have asked the patient to call Dr. Fabio Bering office to see if he is willing to put him out of work until the patient can see Dr. Craige Cotta on March 22nd. I will forward this message to Dr. Fabio Bering nurse, Deliah Goody, and ask that she speak with him regarding this matter. Follow-up by: Michel Bickers CMA,  February 16, 2010 2:52 PM  Additional Follow-up for Phone Call Additional follow up Details #1::        Ok to be out of work until Dr Craige Cotta returns.  The patient has signifcant respitroy failure and is on oxygen.  Dr Craige Cotta should address permanent disability if his respitory failure does not improve Additional Follow-up by: Colon Branch, MD, [facility],  February 16, 2010 4:10 PM     Appended Document: WORK NOTE I spoke with Stanton Kidney and Dr. Eden Emms will write the note for patient and she will contact him regarding this matter. Patient should keep follow-up with Dr. Craige Cotta on 03/03/10.  Appended Document: WORK NOTE placed at front desk for pt pick up

## 2011-01-12 NOTE — Miscellaneous (Signed)
Summary: Orders Update  Clinical Lists Changes  Orders: Added new Referral order of DME Referral (DME) - Signed 

## 2011-01-12 NOTE — Assessment & Plan Note (Signed)
Summary: rov after pft- ok per lori ////kp   Primary Provider/Referring Provider:  Dr. Gilmore Laroche  CC:  1 wk follow up with PFTs.  states breathing is the same-no better or worse.  c/o productive cough in the mornings -does not know the color of the mucus-states he does not look at it. Marland Kitchen  History of Present Illness: 60 yo male with COPD, hypoxemia, severe OSA on CPAP 11 cm, and tobacco abuse.  He was recently switched from an ACE inhibitor to and ARB due to concerns for upper airway irritation.  This only had marginal benefit.  He has still been having a cough.  He has chest congestion, but has trouble bringing up sputum.  This is more troublesome in the morning.  He has sinus congestion, and post-nasal drip.  This has been present since he had a nasal fracture.  He denies hemoptysis.  He has been getting a dry mouth.  He is using albuterol 5 to 6 times per day.  This helps some, but does not seem to last.  He has been getting chest pain with exertion.  He feels his rate beating heavily at times, and this can even happen at rest.  He does have leg swelling, but no worse than usual.  He continues to smoke, but has cut down to 1 cigarette every few weeks.  He is doing okay with his CPAP.  -  Date:  12/24/2009    FVC: 2.77    FVC % EXPECT: 73    FEV1: 1.55    FEV1 % EXP: 55    FEF 25-75% 0.61    FEF %Expect 21    TLC 6.86    TLC %Expect 130    RV 3.99    RV %Expect 209    DLCO 16.9    DLCO %Expect 67    POST FVC 3.07    POST FVC%EXP 81    POST FEV1 1.60    POSTFEV1%PRED 57    POSTFEF25/75% 0.43    FEF 25/75 %EXPS 15   Current Medications (verified): 1)  Spiriva Handihaler 18 Mcg  Caps (Tiotropium Bromide Monohydrate) .... Two Puffs in Handihaler Daily 2)  Symbicort 160-4.5 Mcg/act  Aero (Budesonide-Formoterol Fumarate) .... Two Puffs Twice Daily 3)  Ventolin Hfa 108 (90 Base) Mcg/act  Aers (Albuterol Sulfate) .Marland Kitchen.. 1-2 Puffs Every 4-6 Hours As Needed 4)  Albuterol  Sulfate (2.5 Mg/59ml) 0.083%  Nebu (Albuterol Sulfate) .... Inhale 1 Vial Via Hhn Every 4 Hrs As Needed 5)  Allegra 180 Mg  Tabs (Fexofenadine Hcl) .... Take 1 Tablet By Mouth Once A Day 6)  Nasacort Aq 55 Mcg/act  Aers (Triamcinolone Acetonide(Nasal)) .... Take 2 Sprays in Each Nostril Once A Day 7)  Ranitidine Hcl 75 Mg  Tabs (Ranitidine Hcl) .... Take 2 Tablets By Mouth At Bedtime 8)  Lasix 40 Mg  Tabs (Furosemide) .... Take 1/2  Tablet By Mouth Two Times A Day 9)  Lipitor 20 Mg Tabs (Atorvastatin Calcium) .Marland Kitchen.. 1 By Mouth Daily 10)  Levothyroxine Sodium 200 Mcg  Solr (Levothyroxine Sodium) .... Take 1 Tablet By Mouth Once A Day 11)  Flomax 0.4 Mg Xr24h-Cap (Tamsulosin Hcl) .Marland Kitchen.. 1 By Mouth Daily 12)  Tramadol Hcl 50 Mg Tabs (Tramadol Hcl) .... As Needed 13)  Robaxin 500 Mg Tabs (Methocarbamol) .Marland Kitchen.. 1 By Mouth Two Times A Day 14)  Diovan 160 Mg  Tabs (Valsartan) .... One By Mouth Daily 15)  Extra Strength Pain Reliever 500 Mg Tabs (Acetaminophen) .Marland KitchenMarland KitchenMarland Kitchen  As Needed  Allergies (verified): 1)  ! Pcn  Past History:  Past Medical History: C O P D      - PFT 12/24/09 FVC 3.07(81%), FEV1 1.60(57%), FEV1% 52, TLC 6.86(130%), DLCO 67%, no BD G E R D Hypertension Hypothyroidism Sleep Apnea      - CPAP 11 cm H2O  Vital Signs:  Patient profile:   60 year old male Height:      64 inches Weight:      226 pounds BMI:     38.93 O2 Sat:      93 % on 2.5 L/minpulsed Temp:     98.1 degrees F oral Pulse rate:   86 / minute BP sitting:   112 / 78  (left arm) Cuff size:   regular  Vitals Entered By: Gweneth Dimitri RN (December 24, 2009 1:42 PM)  O2 Flow:  2.5 L/minpulsed CC: 1 wk follow up with PFTs.  states breathing is the same-no better or worse.  c/o productive cough in the mornings -does not know the color of the mucus-states he does not look at it.  Comments Medications reviewed with patient Gweneth Dimitri RN  December 24, 2009 1:42 PM    Physical Exam  General:  on supplemental oxygen and  obese.   Nose:  clear drainage, no sinus tenderness Mouth:  MP 3, mild erythema Neck:  no JVD.   Lungs:  decreased breath sounds, no wheezing or rales Heart:  regular rhythm and normal rate.   Abdomen:  obese, soft Extremities:  minimal ankle edema Cervical Nodes:  no significant adenopathy   Pre-Spirometry FVC    Value: 2.77 L/min   % Pred: 73 % FEV1    Value: 1.55 L     % Pred: 55 % FEF 25-75  Value: 0.61 L/min   % Pred: 21 %  Post-Spirometry FVC    Value: 3.07 L/min   % Pred: 81 % FEV1    Value: 1.60 L     % Pred: 57 % FEF 25-75  Value: 0.43 L/min   % Pred: 15 %  Lung Volumes TLC    Value: 6.86 L   % Pred: 130 % RV    Value: 3.99 L   % Pred: 209 % DLCO    Value: 16.9 %   % Pred: 67 %  Impression & Recommendations:  Problem # 1:  C O P D (ICD-496) His PFT findings are consistant with COPD.  Will continue his current inhaler regimen.  I am concerned that some of his persistant dyspnea may be related to heart disease, and will arrange for further evaluation of this possibility.  He likely also has a component of deconditioning.  Problem # 2:  HYPOXEMIA (ICD-799.02) He is to continue on supplemental oxygen.  Problem # 3:  TOBACCO ABUSE (ICD-305.1) Encouraged him to continue with his smoking cessation.  Problem # 4:  CHEST PAIN UNSPECIFIED (ICD-786.50) I am concerned that some of his dyspnea may be related to heart disease.  I will check his labs, and Echo.  Also will assess for RV dysfunction and pulmonary hypertension with an Echo.  Will arrange for cardiology evaluation to further assess.  Problem # 5:  CHRONIC RHINITIS (ICD-472.0) He is to continue nasal irrigation, and nasonex.  Problem # 6:  OBSTRUCTIVE SLEEP APNEA (ICD-327.23) He is to continue on his current CPAP set up.  Complete Medication List: 1)  Spiriva Handihaler 18 Mcg Caps (Tiotropium bromide monohydrate) .... Two puffs in handihaler daily  2)  Symbicort 160-4.5 Mcg/act Aero (Budesonide-formoterol  fumarate) .... Two puffs twice daily 3)  Ventolin Hfa 108 (90 Base) Mcg/act Aers (Albuterol sulfate) .Marland Kitchen.. 1-2 puffs every 4-6 hours as needed 4)  Albuterol Sulfate (2.5 Mg/80ml) 0.083% Nebu (Albuterol sulfate) .... Inhale 1 vial via hhn every 4 hrs as needed 5)  Allegra 180 Mg Tabs (Fexofenadine hcl) .... Take 1 tablet by mouth once a day 6)  Nasacort Aq 55 Mcg/act Aers (Triamcinolone acetonide(nasal)) .... Take 2 sprays in each nostril once a day 7)  Ranitidine Hcl 75 Mg Tabs (Ranitidine hcl) .... Take 2 tablets by mouth at bedtime 8)  Lasix 40 Mg Tabs (Furosemide) .... Take 1/2  tablet by mouth two times a day 9)  Lipitor 20 Mg Tabs (Atorvastatin calcium) .Marland Kitchen.. 1 by mouth daily 10)  Levothyroxine Sodium 200 Mcg Solr (Levothyroxine sodium) .... Take 1 tablet by mouth once a day 11)  Flomax 0.4 Mg Xr24h-cap (Tamsulosin hcl) .Marland Kitchen.. 1 by mouth daily 12)  Tramadol Hcl 50 Mg Tabs (Tramadol hcl) .... As needed 13)  Robaxin 500 Mg Tabs (Methocarbamol) .Marland Kitchen.. 1 by mouth two times a day 14)  Diovan 160 Mg Tabs (Valsartan) .... One by mouth daily 15)  Extra Strength Pain Reliever 500 Mg Tabs (Acetaminophen) .... As needed  Other Orders: Est. Patient Level III (78295) Cardiology Referral (Cardiology) Echo Referral (Echo) TLB-BMP (Basic Metabolic Panel-BMET) (80048-METABOL) TLB-CBC Platelet - w/Differential (85025-CBCD) TLB-Hepatic/Liver Function Pnl (80076-HEPATIC) TLB-TSH (Thyroid Stimulating Hormone) (84443-TSH) TLB-BNP (B-Natriuretic Peptide) (83880-BNPR)  Patient Instructions: 1)  Lab test today 2)  Will schedule Echocardiogram 3)  Will schedule cardiology evaluation 4)  Follow up in 3 to 4 weeks    Ambulatory Pulse Oximetry  Resting; HR 94    02 Sat 92% 2.5 L/M  Lap1 (185 feet)   HR 94   02 Sat 114 Lap2 (185 feet)   HR 92   02 Sat 115    Lap3 (185 feet)   HR 92   02 Sat 107  _X_Test Completed without Difficulty ___Test Stopped due to:  Reynaldo Minium CMA  December 24, 2009 2:28 PM
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
## 2011-01-12 NOTE — Progress Notes (Signed)
Summary: new nebulizer order  Phone Note From Other Clinic   Caller: Amalia Hailey, RRT with [facility] Call For: Dr. Craige Cotta Summary of Call: [phone] Fax sent form [facility] by Amalia Hailey, RRT stating that pt needed a Rx for a new nebulizer.  Pleae fax to her attention at [phone]. Please put order in [facility] box. Thanks,  Initial call taken by: Alfonso Ramus,  October 26, 2010 11:48 AM  Follow-up for Phone Call        Order placed in [facility]. Follow-up by: Coralyn Helling MD,  October 27, 2010 3:17 PM     Appended Document: new nebulizer order Order faxed to [facility] for nebulizer.

## 2011-01-12 NOTE — Progress Notes (Signed)
Summary: talk to nurse  Phone Note Call from Patient Call back at Home Phone [phone] Call back at [phone]   Caller: Patient Call For: sood Reason for Call: Talk to Nurse Summary of Call: Has a 10a appt with VS today, wants to know if he can rsc for later this afternoon, re: diarrhea, chills, pls advise. Initial call taken by: Darletta Moll,  June 10, 2010 8:24 AM  Follow-up for Phone Call        Spoke with pt.  He had appt this am at 10 am but cancelled due to having gi problems.  Looks like he cancelled the last appt he had too. Your first next available is on 07/01/10.  He states that he could not come in that day but resched for 07/06/10 at 9:30 am.  He is requesting that VS extend his absense from work to last another 13 wks.  He states that his breathing is still not good enough to go back.  He wants note stating that he can return after 09/07/10.  Please advise thanks! Follow-up by: Vernie Murders,  June 10, 2010 8:53 AM  Additional Follow-up for Phone Call Additional follow up Details #1::        He needs to have ROV first before have any further extension for work absence. Additional Follow-up by: Coralyn Helling MD,  June 10, 2010 8:59 AM     Appended Document: talk to nurse Spoke with pt and advised per VS- has to keep appt for this note for work he is requesting.  Pt verbalized understanding.

## 2011-01-14 NOTE — Progress Notes (Signed)
Summary: disability papers  Phone Note Call from Patient   Caller: Patient Call For: sood Reason for Call: Talk to Doctor Summary of Call: pt wants his disability papers asap. he says he was told by someone in [facility] that dr sood had not signed this yet. pt # [id number] Initial call taken by: Tivis Ringer, CNA,  December 10, 2010 10:33 AM  Follow-up for Phone Call        Patient is aware Dr. Craige Cotta has been out of the office and has not reviewed disability papers yet.  Patient would like to have this done ASAP. Forms in Dr. Evlyn Courier look at folder.Michel Bickers [facility]  December 10, 2010 10:51 AM  Additional Follow-up for Phone Call Additional follow up Details #1::        I have signed form Additional Follow-up by: Coralyn Helling MD,  December 10, 2010 1:14 PM    Additional Follow-up for Phone Call Additional follow up Details #2::    Spoke with Lawson Fiscal.  She is sending form back to [employer].  Called, spoke with pt.  He is aware Dr. Craige Cotta signed form and we are sending it back to [employer].  He verbalized understanding of this. Follow-up by: Gweneth Dimitri RN,  December 10, 2010 2:14 PM

## 2011-01-14 NOTE — Progress Notes (Signed)
Summary: O2 level 85 wheezing > ov /w TP today at 2pm  Phone Note Call from Patient   Caller: Patient Call For: dr sood Summary of Call: patient was seen on 1/10 he isnt any better and his O2 keeps falling below 90 to around 85. He wants to know if he needs to come back in he is wheezing worse than before. He can be reached [phone]  Initial call taken by: Vedia Coffer,  December 25, 2010 10:55 AM  Follow-up for Phone Call        spoke with the pt and he states he feels like he is worse since ov on 12-22-10. he states he is haivng trouble keeping his oxygen above 90%. he has increased his o2 to 4 liters. He also c/o increased wheezing.  He states his sats have been as low as 85%. Please advise.[name] CMA  December 25, 2010 11:19 AM   Additional Follow-up for Phone Call Additional follow up Details #1::        will need ov  Additional Follow-up by: Rubye Oaks NP,  December 25, 2010 12:14 PM    Additional Follow-up for Phone Call Additional follow up Details #2::    called spoke with patient, ov scheduled for 2pm today with TP. Boone Master CNA/MA  December 25, 2010 12:17 PM
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
## 2011-01-14 NOTE — Progress Notes (Signed)
Summary: nasacort prescription  Phone Note Call from Patient   Caller: Patient Call For: dr Craige Cotta Summary of Call: patient phoned he is switching all of his prescriptions to [facility] and he can save money if he gets a 90 day supply. The only prescription that needs to be changed is his Nasacort. He wants to know if we can fax this to  [facility] He states that they received one yesterday but it was only for 30 days. Patient can be reached at [phone]  Initial call taken by: Vedia Coffer,  January 06, 2011 3:58 PM  Follow-up for Phone Call        Spoke with pt.  He is requesting 90 day supply of nasacort instead of 30 days.  Rx sent to [address] [facility] [address] per pt request.  Follow-up by: Vernie Murders,  January 06, 2011 4:29 PM    Prescriptions: NASACORT AQ 55 MCG/ACT  AERS (TRIAMCINOLONE ACETONIDE(NASAL)) Take 2 sprays in each nostril twice a day  #3 x 3   Entered by:   Vernie Murders   Authorized by:   Coralyn Helling MD   Signed by:   Vernie Murders on 01/06/2011   Method used:   [facility]ronically to        [facility] [phone]* (retail)       [address]       [place], [place]  [address]       Ph: [phone]       Fax: [phone]   RxID:   [id number]

## 2011-01-14 NOTE — Progress Notes (Signed)
Summary: poss upper respiratory inf  Phone Note Call from Patient Call back at [phone]   Caller: Patient Call For: sood Summary of Call: Pt c/o possible upper respiratory inf coughing white to clear phlegm x several days wants something called in pls advise.//[facility] Initial call taken by: Darletta Moll,  December 21, 2010 12:26 PM  Follow-up for Phone Call        Spoke with pt.  He is c/o increased SOB and wheezing x 2 days- appt with TP sched for tommorrow at 3:30 pm.  ED advised sooner if needed. Follow-up by: Vernie Murders,  December 21, 2010 2:02 PM

## 2011-01-14 NOTE — Progress Notes (Signed)
Summary: directions for med  Phone Note Call from Patient Call back at [phone]   Caller: Patient Call For: [name] Summary of Call: Need directions for his prednisone. Initial call taken by: Darletta Moll,  January 01, 2011 10:09 AM  Follow-up for Phone Call        pt just wanted to clarify directions for pred taper and that he was supposed to take all 3 tablets at once each day. I advsied this was correct that he was to take 3 tablets together in am x 3 days and taper as directed per discharge summary. Pt stated understandign. [name] CMA  January 01, 2011 11:27 AM
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
## 2011-01-14 NOTE — Progress Notes (Signed)
Summary: refill--Nasacort > done  Phone Note Call from Patient Call back at [phone]   Caller: Patient Call For: SOOD Reason for Call: Talk to Nurse Summary of Call: Patient needing [facility]ill--[facility]acort--[facility] [address] [address] Initial call taken by: Lehman Prom,  January 05, 2011 1:43 PM  Follow-up for Phone Call        VS, pt is requesting a refill on his Nasacort.  I do not see anywhere in EMR where you've refilled this for pt.  Please advise if ok to fill or not.  Thanks.  Aundra Millet Reynolds LPN  January 05, 2011 2:13 PM   Additional Follow-up for Phone Call Additional follow up Details #1::        Please send script for nasacort two sprays each nostril once daily as needed, dispense one with 6 refills. Additional Follow-up by: Coralyn Helling MD,  January 06, 2011 2:06 PM    Additional Follow-up for Phone Call Additional follow up Details #2::    [facility] [address] [address]  LMOM to notify pt that [facility]  encourged pt to call with any questions/concerns. Boone Master CNA/MA  January 06, 2011 2:13 PM   Prescriptions: NASACORT AQ 55 MCG/ACT  AERS (TRIAMCINOLONE ACETONIDE(NASAL)) Take 2 sprays in each nostril twice a day  #1 x 6   Entered by:   Boone Master CNA/MA   Authorized by:   Coralyn Helling MD   Signed by:   Boone Master CNA/MA on 01/06/2011   Method used:   [facility]        [facility]  [address] [phone]       [address]       [place], [place]  [address]       Ph: [phone]       Fax: [phone]   RxID:   [id number]
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
## 2011-02-12 ENCOUNTER — Telehealth (INDEPENDENT_AMBULATORY_CARE_PROVIDER_SITE_OTHER): Payer: Self-pay | Admitting: *Deleted

## 2011-02-18 NOTE — Progress Notes (Signed)
Summary: lorazepam refill  Phone Note Call from Patient Call back at [phone]   Caller: Patient Call For: sood Reason for Call: Refill Medication Summary of Call: Request refill on lorazepa, 0.5mg .//[facility] Initial call taken by: Darletta Moll,  February 12, 2011 12:28 PM  Follow-up for Phone Call        Dr Craige Cotta, pls advise if okay with you to send in refill for lorazepam? You gave rx at last ov on 01/11/11 for #30 with 1 rf.  Pls advise thanks! Follow-up by: Vernie Murders,  February 12, 2011 1:42 PM  Additional Follow-up for Phone Call Additional follow up Details #1::        Okay to send script for lorazepam 0.5 mg two times a day as needed.  Dispense 60 with 1 refill. Additional Follow-up by: Coralyn Helling MD,  February 12, 2011 4:46 PM    Additional Follow-up for Phone Call Additional follow up Details #2::    Called and spoke with Neysa Bonito (pharmacist) and informed her of rx info Called and informed pt rx was sent and he verbalized understanding Carver Fila  February 12, 2011 4:56 PM   Prescriptions: LORAZEPAM 0.5 MG TABS (LORAZEPAM) one two times a day as needed  #60 x 1   Entered by:   Carver Fila   Authorized by:   Coralyn Helling MD   Signed by:   Carver Fila on 02/12/2011   Method used:   Telephoned to ...       [facility]  [address] [id number]       [address]       [place], [place]  [address]       Ph: [phone]       Fax: [phone]   RxID:   [id number]

## 2011-04-16 ENCOUNTER — Telehealth: Payer: Self-pay | Admitting: Pulmonary Disease

## 2011-04-16 NOTE — Telephone Encounter (Signed)
Received request from [facility] pharmacy for acapella device.  Will send order through [facility].

## 2011-04-27 NOTE — H&P (Signed)
NAMESIDDH, VANDEVENTER                ACCOUNT NO.:  [id number]   MEDICAL RECORD NO.:  [id number]           PATIENT TYPE:   LOCATION:                                 FACILITY:   PHYSICIAN:  Maisie Fus B. Dixon, P.A.  DATE OF BIRTH:  22-Nov-1951   DATE OF ADMISSION:  DATE OF DISCHARGE:                              HISTORY & PHYSICAL   CHIEF COMPLAINT:  Right knee pain.   HPI:  Worsening right knee pain in this 60 year old gentleman that has  been refractory with conservative treatment.  Patient would like to have  a right total knee arthroplasty by Dr. Malon Kindle.   PAST MEDICAL HISTORY:  1. Asthma.  2. Bronchitis.  3. COPD.  4. Sleep apnea.  5. GERD.  6. Hypertension.   FAMILY MEDICAL HISTORY:  Coronary artery disease and cancer.   SOCIAL HISTORY:  Patient of Dr. Tanya Nones, __________ [facility].  He  does smoke and occasional alcohol.  No illicit drug use.   DRUG ALLERGIES:  PENICILLIN.   CURRENT MEDICATIONS:  1. Gemfibrozil 60 mg p.o. b.i.d.  2. Budeprion XL 300 mg p.o. daily.  3. Triamcinolone ointment 0.1% b.i.d. to affected area.  4. Ranitidine 75 mg p.o. daily.  5. Tylenol 650 mg 6 every 8 hours p.r.n. pain.  6. Lasix 40 mg p.o. daily.  7. Fexofenadine 180 mg p.o. daily.  8. ProAir inhaler q.i.d. 1 to 2 puffs p.r.n.  9. Arthrotec 75 mg p.o. b.i.d.  10.Levothyroxine 0.05 mg daily.  11.Atrovent inhaler 1 to 2 puffs q.i.d. p.r.n.  12.Advil 250/50 b.i.d. 1 puff.  13.Sucralfate 1 g q.i.d.  14.Quinapril 20 mg p.o. daily.  15.Levothyroxine 200 mcg daily.  16.SF 5000 dental cream t.i.d. p.r.n.   REVIEW OF SYSTEMS:  Pain with ambulation.   PHYSICAL EXAM:  Pulse 90.  Respirations 20.  Blood pressure 162/90.  Patient is a healthy-appearing, 60 year old male in no acute distress.  Pleasant mood and affect.  Alert and oriented x3.  HEAD AND NECK:  Shows full range of motion without any difficulty.  Cranial nerves II-XII are grossly intact.  CHEST:  Active breath sounds  bilaterally.  No wheezes, rhonchi, or  rales.  HEART:  Shows regular rate and rhythm with no murmur.  ABDOMEN:  Patient does have a mild to moderate ventral hernia to the  right aspect of his abdomen but no signs of incarceration currently  EXTREMITIES:  There is moderate tenderness to the right knee with range  of motion, especially in the medial joint line.  SKIN:  Shows no rashes.  NEUROVASCULAR:  Intact distally in bilateral upper and lower  extremities.   X-ray shows end-stage osteoarthritis to the right knee.   IMPRESSION:  Right knee end-stage osteoarthritis.   PLAN:  Have a right total knee arthroplasty to be performed by Dr.  Malon Kindle.      Thomas B. Ferne Coe.     TBD/MEDQ  D:  11/28/2007  T:  11/29/2007  Job:  [id number]
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
## 2011-04-27 NOTE — Letter (Signed)
May 26, 2010     RE:  [name], [name]  MRN:  [id number]  /  DOB:  23-Dec-1950   To Whom It May Concern:   [name] is under my care at [facility] for his catastrophic  COPD.  He has GOLD stage III COPD associated with chronic hypoxemic  respiratory failure.  If you have any questions regarding this, please  feel free to contact me at [phone].    Sincerely,      Coralyn Helling, MD  Electronically Signed    VS/MedQ  DD: 05/26/2010  DT: 05/27/2010  Job #: [id number]

## 2011-04-27 NOTE — Op Note (Signed)
[name], CONFER                ACCOUNT NO.:  [id number]   MEDICAL RECORD NO.:  [id number]          PATIENT TYPE:  INP   LOCATION:  2550                         FACILITY:  [facility]   PHYSICIAN:  Almedia Balls. Ranell Patrick, M.D. DATE OF BIRTH:  May 07, 1951   DATE OF PROCEDURE:  12/29/2007  DATE OF DISCHARGE:                               OPERATIVE REPORT   PREOPERATIVE DIAGNOSES:  Right-knee osteoarthritis, severe.   POSTOPERATIVE DIAGNOSES:  Right-knee osteoarthritis, severe.   PROCEDURES PERFORMED:  Right-knee total knee replacement using DePuy  segmental rotating platform prosthesis.   ATTENDING SURGEON:  Almedia Balls. Ranell Patrick, M.D.   ASSISTANT:  Donnie Coffin. Dixon, P.A.-C.   ANESTHESIA:  General anesthesia plus femoral block anesthesia was used.   ESTIMATED BLOOD LOSS:  Minimal.   TOURNIQUET TIME:  One hour and 40 minutes at 300 mmHg.   INSTRUMENT COUNT:  Correct.   COMPLICATIONS:  None.   PREVENTIVE ANTIBIOTICS:  Given.   INDICATIONS:  Patient is a 60 year old male with a history of end-stage  arthritis of the right knee.  Patient has bone-on-bone arthritis and  desires operative treatment to restore function and eliminate pain.   Informed consent was obtained.   DESCRIPTION OF PROCEDURE:  After an adequate level of anesthesia was  achieved, the patient was positioned supine on the operating room table.  The right leg was correctly identified.  A nonsterile tourniquet was  placed on the proximal thigh.  The right leg was sterilely prepped and  draped in the usual manner.  We exsanguinated the limb and elevated the  tourniquet to 300 mmHg and then flexed the knee and made our midline  incision with the knee in flexion.  This was done using a #10 blade  scalpel.  Dissection was carried sharply down through the subcutaneous  tissues.  The parapatellar tissues were identified.  We performed a  medial parapatellar arthrotomy, using a fresh #10 blade scalpel.  We  divided the lateral  patellofemoral ligaments, everted the patella and  then exposed the distal femur.  We performed an entry step-cut drill  into the distal femur to provide access to the femoral intramedullary  canal for intramedullary distal femoral guide.  We resected 11 mm off  for a flexion contracture of the distal femur, set 5 degrees right.  Next, we went ahead and sized the femur to a size 3, made our chamfer  cuts and our anterior/posterior cuts.  We then went ahead and moved to  the tibia, removed the ACL, PCL and meniscal tissues, subluxed the tibia  forward and made our 90 degree perpendicular cut, using extra-alignment  jig with minimal posterior slope.  We then checked with the flexion and  extension blocks to make sure we had adequate gaps which were symmetric  and balanced, which they were.   We then turned our attention back to the femur.  We used our box-cutting  guide to resect the bone necessary for a posterior cruciate substituting  prosthesis.  We next went ahead and sized the tibia to a size 3 and then  went ahead and did our  keel punch and completed preparations on the  tibial side.   We went ahead and put our trial components in with a 12.5 insert and  then trialed with a 15 insert, which provided excellent stability and  appropriate balance.  We then resurfaced our patella, free-hand  technique, starting at 23 mm of thickness, going down to a 15, and  placing a 32 button from DePuy in place.  Once we took the knee through  a full range of motion and noted to patellar tracking to be normal, we  removed all trial components.  We thoroughly irrigated with the pulse  irrigator and then went ahead and plugged the distal end of the femur  with available bone.  We then mixed our cement, using third generation  techniques, vacuum mixing, and then cemented the components into place,  the tibia first, then the femur, and then placing a 15 mm insert in and  placed the knee in full extension  with an axial load.  We then cemented  the patella into place and applied a patellar clamp.   Once the cement was allowed to harden, we took the knee through a full  range of motion.  We had good soft-tissue balancing.  We selected the  real 15 insert, removed excess cement, using a curved osteotome, and  then placed the real component in place, took the knee through a full  range of motion again.  We had normal range of motion with good soft-  tissue balancing and normal patellar tracking.  We then closed the knee,  the parapatellar arthrotomy first, with interrupted #1 PDS suture figure-  of-eight, followed by 0 and 2-0 Vicryl for subcutaneous closure and 4-0  Monocryl for  skin.  Steri-Strips were applied, followed by a sterile  dressing.   The patient tolerated surgery well.      Almedia Balls. Ranell Patrick, M.D.  Electronically Signed     SRN/MEDQ  D:  12/29/2007  T:  12/29/2007  Job:  [id number]

## 2011-04-27 NOTE — Discharge Summary (Signed)
[name], [name]                ACCOUNT NO.:  [id number]   MEDICAL RECORD NO.:  [id number]          PATIENT TYPE:  INP   LOCATION:  5019                         FACILITY:  [facility]   PHYSICIAN:  Almedia Balls. Ranell Patrick, M.D. DATE OF BIRTH:  1951-07-20   DATE OF ADMISSION:  12/29/2007  DATE OF DISCHARGE:  01/02/2008                               DISCHARGE SUMMARY   ADMITTING DIAGNOSES:  1. Right knee end-stage osteoarthritis  2. Obstructive sleep apnea.  3. Gastroesophageal reflux disease.  4. Hypertension.  5. Hypothyroidism.  6. Chronic obstructive pulmonary disease.   DISCHARGE DIAGNOSES:  1. Right knee end-stage osteoarthritis  2. Obstructive sleep apnea.  3. Gastroesophageal reflux disease.  4. Hypertension.  5. Hypothyroidism.  6. Chronic obstructive pulmonary disease.   PROCEDURES PERFORMED:  Right total knee arthroplasty performed on  December 29, 2007 by Dr. Ranell Patrick.   CONSULTING SERVICES:  Pulmonary critical care medicine and physical  therapy, occupational therapy, discharge planning, and respiratory  therapy.   HISTORY OF PRESENT ILLNESS:  The patient is a 60 year old male with  worsening right hip arthritis.  The patient has failed conservative  management.  Desires operative treatment to restore function and  alleviate pain.  He is admitted for total knee arthroplasty.  For  further details of the patient's Past Medical History and Physical  Examination, please see the medical record.   HOSPITAL COURSE:  The patient admitted to orthopedics on December 29, 2007.  Underwent right total knee arthroplasty without complication.  Postoperatively in the recovery room the patient was noted to have  hypoxemia and was evaluated by pulmonary critical care.  The patient  does have a history of COPD and asthma.  The patient was felt to have  some mild bronchospasm after wake up from irritability from the tube.  The patient subsequently stabilized and had an uncomplicated  postoperative course remaining afebrile following regular diet.  He is  ambulating well with physical therapy prior to discharge.  Will be  discharged home with PT, OT, and home health  nursing.  For Coumadin anticoagulation was managed by pharmacy.  The  patient will be following up in less than two weeks.  He was discharged  on Percocet and Robaxin as well as preadmission medications, and also he  is discharged on Coumadin.      Almedia Balls. Ranell Patrick, M.D.  Electronically Signed     SRN/MEDQ  D:  01/20/2008  T:  01/22/2008  Job:  [id number]

## 2011-04-27 NOTE — Op Note (Signed)
[name], [name]                ACCOUNT NO.:  [id number]   MEDICAL RECORD NO.:  [id number]          PATIENT TYPE:  INP   LOCATION:  2918                         FACILITY:  [facility]   PHYSICIAN:  Almedia Balls. Ranell Patrick, M.D. DATE OF BIRTH:  04-09-1951   DATE OF PROCEDURE:  12/29/2007  DATE OF DISCHARGE:                               OPERATIVE REPORT   PREOPERATIVE DIAGNOSIS:  Right knee osteoarthritis, end-stage.   POSTOPERATIVE DIAGNOSIS:  Right knee osteoarthritis, end-stage.   PROCEDURE PERFORMED:  Right total knee arthroplasty and using DePuy  Sigma rotating-platform prosthesis.   ATTENDING SURGEON:  Almedia Balls. Ranell Patrick, M.D.   ASSISTANT:  Donnie Coffin. Dixon, P.A.-C.   General anesthesia plus femoral block was used.   ESTIMATED BLOOD LOSS:  Minimal.   FLUID REPLACEMENT:  2000 mL crystalloid.   TOURNIQUET TIME:  1 hour 40 minutes at 300 mmHg.   Instrument count was correct.  There were no complications.  Perioperative antibiotics were given.   INDICATIONS:  The patient is a 60 year old male with end-stage arthritis  of the right knee.  The patient has failed conservative management and  presents with disabling pain to the right knee and loss of function and  desires total knee arthroplasty to restore function and eliminate pain.  Informed consent was obtained.   DESCRIPTION OF PROCEDURE:  After an adequate level of anesthesia was  achieved, the patient was positioned supinely on the operating room  table.  The patient's knee was examined under anesthesia.  He had a  flexion contracture of about 5 degrees and flexion to 110 degrees.  After a sterile prep and drape of the right knee, we went ahead and  exsanguinated the limb using an Esmarch bandage and elevated the  tourniquet to 300 mmHg.  A longitudinal midline skin incision was  created with the knee in flexion.  Dissection was carried down through  the subcutaneous tissues.  A fresh knife was used to create a median  parapatellar arthrotomy.  We everted the patella and flexed the knee.  We then entered the distal femur using a step-cut drill.  We irrigated  the knee and then placed a T-handled canal finder in the femoral canal.  We next placed the intramedullary distal femoral cutting guide set at 11  degrees for a 5-degree right knee and resected 11 mm of the distal  femur, sized the knee to a size 3, and then performed our anterior,  posterior and chamfer cuts using the four-in-one cutting guide.  We next  removed the ACL, PCL and meniscus tissue, subluxed the tibia anteriorly,  and then resected the tibia to 90 degrees perpendicular to the long axis  of the tibia with a slight posterior slope and using an external  alignment jig.  We checked our gaps.  With 10-mm gaps we were stable in  both flexion and extension.  We then went ahead and finished our  resection on our femur with the box cut guide to resect the bone  necessary to place our posterior cruciate-substituting prosthesis on.  We then used our keel punch to prepare our  tibia, went ahead and sized  the tibia to a size 3 as well, and finished preparing the tibia.  We  removed excess bone off the posterior femoral condyles using a lamina  spreader and removed all excess soft tissue.  We then placed our trial  components in with a 12.5 insert, had excellent soft tissue balance and  full extension, perhaps even a little hyperextension.  We then went  ahead and resurfaced our patella free-hand, going from 23 down to 15 to  accept a 32 patellar button.  We took the knee through a full range of  motion and there was normal tracking of the patella.  At this point we  went ahead and removed all trial components and thoroughly irrigated the  knee.  We then cemented the components into place using DePuy Smart Set  cement.  Once it was fully hardened, we removed excess cement with a  quarter-inch curved osteotome.  We trialled with a 15 insert.  This   provided full extension with good soft tissue balancing and we selected  that as our real insert, which we placed in place after checking  posteriorly for any excess cement.  Once we had the real implant in, we  took it through a full range of motion, again excellent stability noted  and balance with good tracking.  We went ahead and thoroughly irrigated  and then closed using PDS #1 for figure-of-eight sutures for closure of  the peripatellar arthrotomy.  We then went ahead and closed the  subcutaneous tissues with layered closure with 0 and 2-0 Vicryl,  followed by 4-0 Monocryl for skin.  Steri-Strips were applied, followed  by a sterile dressing.  The patient tolerated the surgery well.      Almedia Balls. Ranell Patrick, M.D.  Electronically Signed     SRN/MEDQ  D:  12/30/2007  T:  12/30/2007  Job:  [id number]

## 2011-04-30 NOTE — Op Note (Signed)
NAMELIONARDO, HAZE                ACCOUNT NO.:  [id number]   MEDICAL RECORD NO.:  [id number]          PATIENT TYPE:  AMB   LOCATION:  DSC                          FACILITY:  [facility]   PHYSICIAN:  Leonides Grills, M.D.     DATE OF BIRTH:  November 02, 1951   DATE OF PROCEDURE:  05/31/2006  DATE OF DISCHARGE:                                 OPERATIVE REPORT   PREOPERATIVE DIAGNOSIS:  Right hallux phalangeus to his right second hammer  toe.   POSTOPERATIVE DIAGNOSIS:  Right hallux phalangeus to his right second hammer  toe.   OPERATION/PROCEDURE:  1.  Right Akin osteotomy.  2.  Right stress x-ray, foot.  3.  Right second toe metatarsophalangeal joint dorsal capsulotomy with      collateral release.  4.  Right second toe proximal phalanx head resection.  5.  Right second toe extensor digitorum brevis to extensor digitorum longus      transfer.   ANESTHESIA:  General.   SURGEON:  Leonides Grills, M.D.   ASSISTANT:  Lianne Cure, P.A.-C.   ESTIMATED BLOOD LOSS:  Minimal.   TOURNIQUET TIME:  Approximately an hour.   COMPLICATIONS:  None.   DISPOSITION:  Stable.   INDICATIONS:  This is a 60 year old male who has had longstanding right  second toe pain with a deformity that was exacerbating this as well,  involving the great toe.  He was consented to the above procedure.  All  risks which include infection, neurovascular injury, malunion, nonunion,  hardware irritation,  hardware failure, persistent pain, worsening pain,  prolonged recovery, stiffness, arthritis were all explained, questions  encouraged and answered.   DESCRIPTION OF PROCEDURE:  The patient was brought to the operating room and  placed in the supine position after adequate general endotracheal tube  anesthesia administered as well as Ancef 1 g IV piggyback.  The right lower  extremity was then prepped and draped in the sterile manner.  A proximally  placed thigh tourniquet.  __________  exsanguinated.  The tourniquet  was  elevated to 200 mmHg.  A longitudinal incision over the base of the right  proximal phalanx medially was then made midline.  Dissection was carried  down directly to bone. Soft tissue was elevated both superiorly and  inferiorly.  A guide pin was then placed parallel to the base of the  proximal phalanx. A wedge osteotomy was then made with the sagittal saw,  protecting soft tissue both superiorly and inferiorly.  Wedge was removed.  A measuring jig 8 mm was then applied for the __________ Solustaple.  The  distal K-wire was then placed through the jig and a drill hole was then  made.  The K-wires and jig were removed and a Solustaple was then placed.  This was tamped into place.  It was flush with the bone.  We then obtained x-  rays, AP and lateral planes, and showed excellent position of the staple as  well as compression of the osteotomy and clinically the toe was in excellent  position as well.  Stress x-ray was obtained and showed no gross motion.  It  was stable in the AP and lateral planes.   We then made a longitudinal incision over the dorsal aspect of the second  toe.  Dissection was carried through the skin. Hemostasis was obtained.  Extensor digitorum longus and brevis tendons were identified.  The longus  was tenotomized proximal and medial and brevis distal lateral, retracted out  of harm's way for later transfer.  The distal aspect of the proximal phalanx  was then skeletonized around the head and the head was then removed with the  rongeur followed by bone cutter. These cuts were made perpendicular to the  long axis of the toe. We then, protecting the soft tissues and neurovascular  structures medially laterally by the assistant and FTP joint across the  capsulotomy with lateral release was than performed.  This had excellent  release of the tight capsule and collaterals.  We then placed a 0.054 K-wire  antegrade through the middle and distal phalanx, reduced the PIP  joint and  fired it retrograde across the proximal phalanx.  The toe was then held in  reduced position and then the K-wire was fired across the MTP joint.  This  had excellent stable fixation.  The K-wire was bent and cut and capped.  Skin-relieving stitches were made on either side of the K-wire.  The EDB to  EDL tendons were then transferred and reconstructed with 3-0 PDS suture.  This had outstanding transfer.  The area was copiously irrigated with normal  saline.  Hemostasis was obtained.  The toe pinked up nicely.  There was  bleeding at the tip of the toe.  All wounds were copiously irrigated with  normal saline.  All wounds were closed with 4-0 nylon suture.  Sterile  dressing was applied.  Hard-sole shoe was applied.  The patient was stable  to the PR.      Leonides Grills, M.D.  Electronically Signed     PB/MEDQ  D:  05/31/2006  T:  06/01/2006  Job:  [id number]
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
## 2011-04-30 NOTE — Op Note (Signed)
[name], [name]                ACCOUNT NO.:  [id number]   MEDICAL RECORD NO.:  [id number]          PATIENT TYPE:  AMB   LOCATION:  DAY                          FACILITY[facility]  [facility]   PHYSICIAN:  Almedia Balls. Ranell Patrick, M.D. DATE OF BIRTH:  03/09/1951   DATE OF PROCEDURE:  02/25/2005  DATE OF DISCHARGE:                                 OPERATIVE REPORT   PREOPERATIVE DIAGNOSIS:  Right knee medial meniscus tear.   POSTOPERATIVE DIAGNOSES:  Right knee medial meniscus tear and medial femoral  condyle chondromalacia grade 4, medial tibial condyle chondromalacia grade4.   PROCEDURE PERFORMED:  Right knee arthroscopy with debridement of posterior  horn medial meniscus tear, abrasion chondroplasty medial femoral condyle and  medial tibial condyle.   ATTENDING SURGEON:  Almedia Balls. Ranell Patrick, M.D.   ASSISTANT:  None.   ANESTHESIA:  Local anesthesia plus heavy MAC was used.   ESTIMATED BLOOD LOSS:  Minimal.   FLUID REPLACEMENT:  1000 cc of crystalloid.   INSTRUMENT COUNT:  Correct.   COMPLICATIONS:  None.   PERIOPERATIVE ANTIBIOTICS:  Given.   INDICATIONS:  The patient is a 60 year old gentleman with a history of sleep  apnea and smoking who presents with severe right medial knee pain.  He has  had failure of conservative management to include anti-inflammatories,  injection therapy, and activity modification.  He presents now with an MRI-  proven meniscal tear for treatment of that meniscus tear.  The patient does  have chondrolysis present on his MRI.  We are also going to inspect that and  see what stage he is at.  Informed consent was obtained.   DESCRIPTION OF PROCEDURE:  After an adequate level of anesthesia was  achieved, the patient was positioned supine on the operating room table.  A  lateral post was utilized.  After sterile prep and drape of the right knee,  arthroscopic portals were created using a similar fashion, infiltration of  the skin with 0.25% Marcaine with epinephrine,  followed by incision with an  11 blade scalpel, introduction of the cannula into the joint using blunt  obturators. Diagnostic arthroscopy revealed some mild patellofemoral  chondromalacia grade 1 and 2 at the most.  No medial or lateral gutter loose  bodies were encountered.  The medial plica was not impinging on the medial  femoral condyle.  The lateral gutter was normal.  The lateral compartment  was pristine with no sign of significant meniscal tear or chondral wear.  ACL and PCL intact.  The medial compartment had a very large 2 x 3 cm full-  thickness chondral lesion present, more towards the medial side of the  medial femoral condyle with the knee at about 60 degrees of flexion.  There  was also a kissing lesion on the tibial condyle.  The meniscus was intact.  In the anterior horn and the medial portion of the meniscus, however,  posteriorly, there was a complex posterior horn tear which we could retract  into the joint.  This was debrided back to stable meniscal portion involving  removal of the meniscal root and about 30% of  the posterior horn of the  medial meniscus.  This was done using biting instrument and a full radius  resector.  We also visualized this through the notch to make sure was had a  complete debridement posteriorly.  We did do an abrasion chondroplasty of  the medial femoral condyle to remove loose flaps and cartilage that was  rough and to try to smooth things out.  No attempt at a marrow stimulation  was performed given his age and smoking.  Thus, we just tried to create a  smooth compartment on that medial side.  Concluding the surgery, we  inspected the knee for any loose pieces of cartilage from the debridement.  None were noted.  We sutured the wounds using 4-0 Monocryl subcuticular  followed by Steri-Strips, sterile dressing, and a cryotherapy unit.  The  patient was taken to the recovery room in stable condition having tolerated  the surgery  well.      SRN/MEDQ  D:  02/25/2005  T:  02/25/2005  Job:  [id number]

## 2011-04-30 NOTE — Op Note (Signed)
[facility]  Patient:    [name], [name] Visit Number: [id number] MRN: [id number]          Service Type: SUR Location: 4W [id number] [id number] Attending Physician:  Skip Mayer Dictated by:   Georges Lynch Darrelyn Hillock, M.D. Proc. Date: 05/10/02 Admit Date:  05/10/2002 Discharge Date: 05/11/2002                             Operative Report  PREOPERATIVE DIAGNOSIS:  Complete retracted torn rotator cuff tear on the right.  POSTOPERATIVE DIAGNOSIS:  Complete retracted torn rotator cuff tear on the right.  OPERATION: 1. Complete repair of the rotator cuff tendon on the right utilizing a    "Graft Jacket" type graft for repair. 2. Acromionectomy.  PROCEDURE:  Under general anesthesia, the patient first had an interscalene block on the right. At this time, the sterile prep and draping of the right shoulder was carried out. An incision was carried out over the anterior aspect of the right shoulder, bleeders identified and cauterized. Self-retaining retractors were inserted. I then separated the deltoid tendon by sharp dissection from the acromion. He had a marked overgrowth of his acromion. I did partial acromionectomy utilizing the oscillating saw at this time. I then went down, there was no rotator cuff present, it was totally retracted. I had to take two Kocher clamps and bring the tendon forward to inspect it. Following this, we then utilized the burr to burr down the lateral articular surface of the humerus. I then utilized a 5x5 Graft Jacket, three anchors were placed in the proximal humerus, and the Graft Jacket was sutured to the tendon and then the remaining part of the Graft Jacket to the bone. We thoroughly irrigated out the area. It was a rather extensive grafting procedure. I then reapproximated the deltoid tendon and muscle in the usual fashion. Skin was closed with sterile staples. Sterile dressing was applied and he was placed in a shoulder  immobilizer. He had 1 g of IV Ancef preoperatively. Dictated by:   Georges Lynch Darrelyn Hillock, M.D. Attending Physician:  Skip Mayer DD:  05/10/02 TD:  05/11/02 Job: [id number]

## 2011-05-13 ENCOUNTER — Telehealth: Payer: Self-pay | Admitting: Pulmonary Disease

## 2011-05-13 NOTE — Telephone Encounter (Signed)
Spoke with pt. He is asking for a new "piece" for his flutter valve. He states that he needs the piece that holds his medicine. I advised I am unsure what he is referring to b/c you do not put medicine in a flutter valve. He states otherwise and is requesting new "piece". I asked if he is referring to neb machine and he says no. Dr Craige Cotta, I am unsure of what to do here, pls advise thanks!

## 2011-05-14 NOTE — Telephone Encounter (Signed)
Would be best to ask him to stop by if possible to show what he is referring to.  He does not need an actual visit, but perhaps can come by when I am in the office, and I can review this quickly.

## 2011-05-14 NOTE — Telephone Encounter (Signed)
Called spoke with patient, advised of VS's recs.  Pt okay with this and stated that he will call one morning next week to see if VS is in the office.  I advised pt that per my schedule, VS is in the office Monday and Tuesday morning.  Pt verbalized his understanding.  Pt to call the office before coming.

## 2011-07-08 ENCOUNTER — Telehealth: Payer: Self-pay | Admitting: Pulmonary Disease

## 2011-07-08 NOTE — Telephone Encounter (Signed)
VS, have you received any disability forms on this pt? Please advise, thanks!

## 2011-07-09 NOTE — Telephone Encounter (Signed)
VS not in the office this afternoon.  Called with patient and informed him of this.  Patient okay with this and verbalized his understanding.    Called lori to see if she had the disability form but was informed that she does not.  Called [employer], spoke with Renee who informed me that she does have patient's disability form for social security down there > request for more info from pt's medical record, so this would not come to our floor at all.  Luster Landsberg gave me the outside number of [phone] to give the patient.  Called the patient again and informed him of what i discovered.  Pt okay with this and verbalized his understanding.  Number and Renee's name given to him but encouraged pt to call our office if he needs anything else.  Will sign off on message.

## 2011-09-02 LAB — APTT: aPTT: 31

## 2011-09-02 LAB — CBC
HCT: 46.8
HCT: 40.3
HCT: 41.9
Hemoglobin: 13.4
MCHC: 33.2
MCHC: 33.4
MCHC: 33.7
MCV: 93.1
MCV: 94.3
Platelets: 241
Platelets: 206
Platelets: 229
Platelets: 254
RDW: 14.6
RDW: 14.5
WBC: 11.4 — ABNORMAL HIGH
WBC: 12.4 — ABNORMAL HIGH

## 2011-09-02 LAB — PROTIME-INR
INR: 0.8
INR: 1
Prothrombin Time: 11.6
Prothrombin Time: 17.6 — ABNORMAL HIGH
Prothrombin Time: 22.5 — ABNORMAL HIGH

## 2011-09-02 LAB — BASIC METABOLIC PANEL
BUN: 18
BUN: 10
BUN: 7
BUN: 8
BUN: 9
CO2: 31
CO2: 31
Calcium: 9.8
Calcium: 8.8
Chloride: 100
Chloride: 99
Creatinine, Ser: 0.78
Creatinine, Ser: 0.85
Creatinine, Ser: 0.87
GFR calc non Af Amer: >60
GFR calc non Af Amer: >60
GFR calc non Af Amer: >60
Glucose, Bld: 118 — ABNORMAL HIGH
Glucose, Bld: 114 — ABNORMAL HIGH
Glucose, Bld: 129 — ABNORMAL HIGH
Potassium: 4
Potassium: 3.7
Sodium: 138

## 2011-09-02 LAB — URINALYSIS, ROUTINE W REFLEX MICROSCOPIC
Ketones, ur: NEGATIVE
Nitrite: NEGATIVE
Protein, ur: NEGATIVE
Urobilinogen, UA: 0.2

## 2011-09-02 LAB — ABO/RH: ABO/RH(D): O POS

## 2011-09-02 LAB — DIFFERENTIAL
Basophils Absolute: 0.1
Lymphocytes Relative: 14
Lymphs Abs: 1.6
Neutro Abs: 8.8 — ABNORMAL HIGH

## 2011-09-02 LAB — TYPE AND SCREEN: ABO/RH(D): O POS

## 2011-09-17 LAB — CBC
HCT: 42.7
MCV: 93.4
Platelets: 270
RDW: 14.3
WBC: 6.7

## 2011-09-17 LAB — POCT I-STAT CREATININE: Creatinine, Ser: 0.8

## 2011-09-17 LAB — I-STAT 8, (EC8 V) (CONVERTED LAB)
BUN: 11
Bicarbonate: 30.7 — ABNORMAL HIGH
Glucose, Bld: 107 — ABNORMAL HIGH
HCT: 46
Operator id: 285491
pCO2, Ven: 54.4 — ABNORMAL HIGH

## 2011-09-17 LAB — DIFFERENTIAL
Eosinophils Absolute: 0.3
Eosinophils Relative: 5
Lymphs Abs: 1.2

## 2011-09-30 ENCOUNTER — Telehealth: Payer: Self-pay | Admitting: Pulmonary Disease

## 2011-09-30 DIAGNOSIS — J449 Chronic obstructive pulmonary disease, unspecified: Secondary | ICD-10-CM

## 2011-09-30 NOTE — Telephone Encounter (Signed)
Called and spoke with pt.  Pt is requesting a new rx for a flutter valve to take to his local pharmacy/dme company.  Pt would like this rx mailed to his home address, which i verified was correct.  VS, please advise if ok for pt to get a new rx for flutter valve. thanks

## 2011-10-01 MED ORDER — ACAPELLA MISC: Status: DC

## 2011-10-01 NOTE — Telephone Encounter (Signed)
Prescription printed and given to MS to have VS sign and then mail to pt.

## 2011-10-01 NOTE — Telephone Encounter (Signed)
Okay to send script for flutter valve.

## 2011-10-04 NOTE — Telephone Encounter (Signed)
Script signed.

## 2011-10-22 ENCOUNTER — Encounter: Payer: Self-pay | Admitting: Pulmonary Disease

## 2011-10-25 ENCOUNTER — Encounter: Payer: Self-pay | Admitting: Pulmonary Disease

## 2011-10-25 ENCOUNTER — Ambulatory Visit (INDEPENDENT_AMBULATORY_CARE_PROVIDER_SITE_OTHER): Payer: 59 | Admitting: Pulmonary Disease

## 2011-10-25 VITALS — BP 130/74 | HR 100 | Temp 98.3°F | Ht 64.0 in | Wt 247.2 lb

## 2011-10-25 DIAGNOSIS — R0902 Hypoxemia: Secondary | ICD-10-CM

## 2011-10-25 DIAGNOSIS — Z23 Encounter for immunization: Secondary | ICD-10-CM

## 2011-10-25 DIAGNOSIS — G4733 Obstructive sleep apnea (adult) (pediatric): Secondary | ICD-10-CM

## 2011-10-25 DIAGNOSIS — J449 Chronic obstructive pulmonary disease, unspecified: Secondary | ICD-10-CM

## 2011-10-25 DIAGNOSIS — F411 Generalized anxiety disorder: Secondary | ICD-10-CM

## 2011-10-25 MED ORDER — LORAZEPAM 0.5 MG PO TABS: 0.5000 mg | ORAL_TABLET | Freq: Three times a day (TID) | ORAL | Status: DC | PRN

## 2011-10-25 NOTE — Assessment & Plan Note (Signed)
He is to continue 3 liters oxygen 24/7. 

## 2011-10-25 NOTE — Assessment & Plan Note (Addendum)
He is to continue his current regimen.  Explained that he will always likely have some cough and congestion.  He can try mucinex as needed.  He will call if he needs a new flutter valve.  He received his influenza vaccine today.  Advised he will not need pneumococcal vaccine until 2013.

## 2011-10-25 NOTE — Progress Notes (Signed)
Chief Complaint  Patient presents with  . Follow-up    Pt states he has his good and bad days with his breathing. pt c/o cough w/ yellow to clear phlem, wheezing, chest tightness.   . Sleep Apnea    Pt states he wears his cpap almost everynight. Pt denies any problems w/ mask/machine. Pt states some nights he has to get up and sleep in the chair due to his breathing    History of Present Illness: [name] is a 60 y.o. male former smoker GOLD 3 COPD, hypoxemia, and Severe OSA.  He has occasional cough with clear sputum.  He is not having as much wheezing.  He denies chest pain or leg swelling.  He gets lots of pain in his shoulders and his knee.  He is doing well with his CPAP machine.  He is using his albuterol 3 times per day, and this helps.  He using oxygen 24/7.  Past Medical History  Diagnosis Date  . COPD (chronic obstructive pulmonary disease)   . OSA (obstructive sleep apnea)   . Anxiety   . Dyspnea     No past surgical history on file.  Current Outpatient Prescriptions on File Prior to Visit  Medication Sig Dispense Refill  . acetaminophen (TYLENOL) 500 MG tablet Take 500 mg by mouth every 6 (six) hours as needed.        Marland Kitchen albuterol (PROVENTIL HFA;VENTOLIN HFA) 108 (90 BASE) MCG/ACT inhaler Inhale 2 puffs into the lungs every 4 (four) hours as needed.        Marland Kitchen albuterol (PROVENTIL) (2.5 MG/3ML) 0.083% nebulizer solution Take 2.5 mg by nebulization every 4 (four) hours as needed.        Marland Kitchen atorvastatin (LIPITOR) 20 MG tablet Take 20 mg by mouth daily.        . budesonide-formoterol (SYMBICORT) 160-4.5 MCG/ACT inhaler Inhale 2 puffs into the lungs 2 (two) times daily.        Marland Kitchen dextromethorphan-guaiFENesin (MUCINEX DM) 30-600 MG per 12 hr tablet Take 1 tablet by mouth every 12 (twelve) hours.        . furosemide (LASIX) 40 MG tablet Take 40 mg by mouth 2 (two) times daily.        Marland Kitchen levothyroxine (SYNTHROID, LEVOTHROID) 200 MCG tablet Take 200 mcg by mouth daily.        Marland Kitchen  loratadine (CLARITIN) 10 MG tablet Take 10 mg by mouth daily.        . methocarbamol (ROBAXIN) 500 MG tablet Take 500 mg by mouth 2 (two) times daily.        . Misc. Devices (ACAPELLA) MISC Dx 496  1 each  0  . nabumetone (RELAFEN) 500 MG tablet Take 500 mg by mouth daily.        Marland Kitchen nystatin (MYCOSTATIN) 100000 UNIT/ML suspension Take 500,000 Units by mouth 4 (four) times daily.        . ranitidine (ZANTAC) 150 MG tablet Take 150 mg by mouth at bedtime.        . SUCRALFATE PO Take by mouth. 1 tablet  4 times day       . Tamsulosin HCl (FLOMAX) 0.4 MG CAPS Take by mouth.        . tiotropium (SPIRIVA) 18 MCG inhalation capsule Place 18 mcg into inhaler and inhale daily.        Marland Kitchen triamcinolone (NASACORT) 55 MCG/ACT nasal inhaler Place 2 sprays into the nose 2 (two) times daily.       Marland Kitchen  valsartan (DIOVAN) 160 MG tablet Take 160 mg by mouth daily.          Allergies  Allergen Reactions  . Penicillins     Physical Exam:  Blood pressure 130/74, pulse 100, temperature 98.3 F (36.8 C), temperature source Oral, height 5\' 4"  (1.626 m), weight 247 lb 3.2 oz (112.129 kg), SpO2 93.00%.  General - Obese, using oxygen HEENT - no sinus tenderness, no oral exudate, no LAN Cardiac - s1s2 no murmur Chest - prolonged exhalation, normal respiratory excursion, no wheeze/rales Abdomen - soft, non-tender Extremities - no e/c/c Skin - no rashes Neurologic - normal strength, CN intact Psychiatric - normal mood, behavior  Assessment/Plan:  C O P D He is to continue his current regimen.  Explained that he will always likely have some cough and congestion.  He can try mucinex as needed.  He will call if he needs a new flutter valve.  He received his influenza vaccine today.  Advised he will not need pneumococcal vaccine until 2013.  Hypoxemia He is to continue 3 liters oxygen 24/7.  OBSTRUCTIVE SLEEP APNEA He is doing well with CPAP.  Anxiety state, unspecified Will continue lorazepam as needed to  help avoid dynamic hyperinflation from tachypnea that can occur with anxiety state.  Advised him to try non-pharmacologic interventions first.     Outpatient Encounter Prescriptions as of 10/25/2011  Medication Sig Dispense Refill  . acetaminophen (TYLENOL) 500 MG tablet Take 500 mg by mouth every 6 (six) hours as needed.        Marland Kitchen albuterol (PROVENTIL HFA;VENTOLIN HFA) 108 (90 BASE) MCG/ACT inhaler Inhale 2 puffs into the lungs every 4 (four) hours as needed.        Marland Kitchen albuterol (PROVENTIL) (2.5 MG/3ML) 0.083% nebulizer solution Take 2.5 mg by nebulization every 4 (four) hours as needed.        Marland Kitchen atorvastatin (LIPITOR) 20 MG tablet Take 20 mg by mouth daily.        . budesonide-formoterol (SYMBICORT) 160-4.5 MCG/ACT inhaler Inhale 2 puffs into the lungs 2 (two) times daily.        Marland Kitchen dextromethorphan-guaiFENesin (MUCINEX DM) 30-600 MG per 12 hr tablet Take 1 tablet by mouth every 12 (twelve) hours.        . furosemide (LASIX) 40 MG tablet Take 40 mg by mouth 2 (two) times daily.        Marland Kitchen levothyroxine (SYNTHROID, LEVOTHROID) 200 MCG tablet Take 200 mcg by mouth daily.        Marland Kitchen loratadine (CLARITIN) 10 MG tablet Take 10 mg by mouth daily.        . methocarbamol (ROBAXIN) 500 MG tablet Take 500 mg by mouth 2 (two) times daily.        . Misc. Devices (ACAPELLA) MISC Dx 496  1 each  0  . nabumetone (RELAFEN) 500 MG tablet Take 500 mg by mouth daily.        Marland Kitchen nystatin (MYCOSTATIN) 100000 UNIT/ML suspension Take 500,000 Units by mouth 4 (four) times daily.        . ranitidine (ZANTAC) 150 MG tablet Take 150 mg by mouth at bedtime.        . SUCRALFATE PO Take by mouth. 1 tablet  4 times day       . Tamsulosin HCl (FLOMAX) 0.4 MG CAPS Take by mouth.        . tiotropium (SPIRIVA) 18 MCG inhalation capsule Place 18 mcg into inhaler and inhale daily.        Marland Kitchen  triamcinolone (NASACORT) 55 MCG/ACT nasal inhaler Place 2 sprays into the nose 2 (two) times daily.       . valsartan (DIOVAN) 160 MG tablet Take  160 mg by mouth daily.        Marland Kitchen DISCONTD: HYDROcodone-acetaminophen (NORCO) 5-325 MG per tablet 6 tablets a day      . LORazepam (ATIVAN) 0.5 MG tablet Take 1 tablet (0.5 mg total) by mouth every 8 (eight) hours as needed for anxiety.  30 tablet  2  . DISCONTD: LORazepam (ATIVAN) 0.5 MG tablet Take 0.5 mg by mouth 2 (two) times daily.        Marland Kitchen DISCONTD: predniSONE (DELTASONE) 10 MG tablet Take 10 mg by mouth.        . DISCONTD: traMADol (ULTRAM) 50 MG tablet Take 50 mg by mouth. 2 tablets  Every  Hours prn pain         [name] Pager:  [phone] 10/25/2011, 5:00 PM

## 2011-10-25 NOTE — Assessment & Plan Note (Signed)
Will continue lorazepam as needed to help avoid dynamic hyperinflation from tachypnea that can occur with anxiety state.  Advised him to try non-pharmacologic interventions first.

## 2011-10-25 NOTE — Assessment & Plan Note (Signed)
He is doing well with CPAP.

## 2011-10-25 NOTE — Patient Instructions (Signed)
Flu shot today Follow up in 6 months 

## 2011-11-18 ENCOUNTER — Telehealth: Payer: Self-pay | Admitting: Pulmonary Disease

## 2011-11-18 NOTE — Telephone Encounter (Signed)
I spoke with [name] and he c/o sore throat, sneezing, sinus pressure, sinus head ache x 2 days. [name] still coughing up yellow phlem. I offered [name] an apt at 12:00 today with TP but stated he could not come in at that time. [name] denies any fever, nausea, vomiting. [name] is scheduled to come in and see TP tomorrow at 11:00 for OV. [name] aware to seek emergency care if he worsens b/w now and then. [name] states he has been doing his neb tx's so he is fine to wait until tomorrow.

## 2011-11-19 ENCOUNTER — Ambulatory Visit (INDEPENDENT_AMBULATORY_CARE_PROVIDER_SITE_OTHER): Payer: 59 | Admitting: Adult Health

## 2011-11-19 ENCOUNTER — Encounter: Payer: Self-pay | Admitting: Adult Health

## 2011-11-19 VITALS — BP 122/60 | HR 109 | Temp 98.8°F | Ht 64.0 in | Wt 249.6 lb

## 2011-11-19 DIAGNOSIS — J449 Chronic obstructive pulmonary disease, unspecified: Secondary | ICD-10-CM

## 2011-11-19 MED ORDER — HYDROCODONE-HOMATROPINE 5-1.5 MG/5ML PO SYRP: 5.0000 mL | ORAL_SOLUTION | Freq: Four times a day (QID) | ORAL | Status: AC | PRN

## 2011-11-19 MED ORDER — LEVOFLOXACIN 500 MG PO TABS: 500.0000 mg | ORAL_TABLET | Freq: Every day | ORAL | Status: AC

## 2011-11-19 MED ORDER — PREDNISONE 10 MG PO TABS: ORAL_TABLET | ORAL | Status: DC

## 2011-11-19 NOTE — Progress Notes (Signed)
  Subjective:    Patient ID: [name], male    DOB: [birth year], 60 y.o.   MRN: [id number]  HPI 60 yo male with known hx  GOLD 3 COPD-former smoker, hypoxemia, and Severe OSA.on CPAP and O2 24/7  11/19/2011 Acute OV  Complains of  increased SOB, wheezing, prod cough with clear/yellow mucus, head congestion with clear drainage, low grade temp x 2 days.  No body aches or chills  Review of Systems Constitutional:   No  weight loss, night sweats,   ++Fevers,  fatigue, or  lassitude.  HEENT:   No headaches,  Difficulty swallowing,  Tooth/dental problems, or  Sore throat,                No sneezing, itching, ear ache,  +nasal congestion, post nasal drip,   CV:  No chest pain,  Orthopnea, PND,   anasarca, dizziness, palpitations, syncope.   GI  No heartburn, indigestion, abdominal pain, nausea, vomiting, diarrhea, change in bowel habits, loss of appetite, bloody stools.   Resp:  No coughing up of blood.  No change in color of mucus.  No wheezing.  No chest wall deformity  Skin: no rash or lesions.  GU: no dysuria, change in color of urine, no urgency or frequency.  No flank pain, no hematuria   MS:  No joint pain or swelling.  No decreased range of motion.  No back pain.  Psych:  No change in mood or affect. No depression or anxiety.  No memory loss.         Objective:   Physical Exam GEN: A/Ox3; pleasant , NAD, obese , chronically ill appearing   HEENT:  [place]/AT,  EACs-clear, TMs-wnl, NOSE-clear, THROAT-clear, no lesions, no postnasal drip or exudate noted.   NECK:  Supple w/ fair ROM; no JVD; normal carotid impulses w/o bruits; no thyromegaly or nodules palpated; no lymphadenopathy.  RESP  Coarse BS  w/o, wheezes/ rales/ or rhonchi.no accessory muscle use, no dullness to percussion  CARD:  RRR, no m/r/g  , no peripheral edema, pulses intact, no cyanosis or clubbing.  GI:   Soft & nt; nml bowel sounds; no organomegaly or masses detected.  Musco: Warm bil, no deformities or  joint swelling noted.   Neuro: alert, no focal deficits noted.    Skin: Warm, no lesions or rashes         Assessment & Plan:

## 2011-11-19 NOTE — Patient Instructions (Signed)
Levaquin 500mg  daily for 7 days  Mucinex DM Twice daily  As needed  Cough/congestion  Prednisone taper over next week  Fluids and rest  Hydromet 1-2 tsp every 4-6 hr As needed  Cough Please contact office for sooner follow up if symptoms do not improve or worsen or seek emergency care  follow up Dr. Craige Cotta  As planned and As needed

## 2011-11-19 NOTE — Assessment & Plan Note (Signed)
Exacerbation   Plan:  Levaquin 500mg  daily for 7 days  Mucinex DM Twice daily  As needed  Cough/congestion  Prednisone taper over next week  Fluids and rest  Hydromet 1-2 tsp every 4-6 hr As needed  Cough Please contact office for sooner follow up if symptoms do not improve or worsen or seek emergency care  follow up Dr. Craige Cotta  As planned and As needed

## 2012-01-11 ENCOUNTER — Telehealth: Payer: Self-pay | Admitting: Pulmonary Disease

## 2012-01-11 ENCOUNTER — Other Ambulatory Visit: Payer: Self-pay | Admitting: Pulmonary Disease

## 2012-01-11 MED ORDER — TRIAMCINOLONE ACETONIDE(NASAL) 55 MCG/ACT NA INHA: 2.0000 | Freq: Two times a day (BID) | NASAL | Status: DC

## 2012-01-11 MED ORDER — BUDESONIDE-FORMOTEROL FUMARATE 160-4.5 MCG/ACT IN AERO: 2.0000 | INHALATION_SPRAY | Freq: Two times a day (BID) | RESPIRATORY_TRACT | Status: DC

## 2012-01-11 MED ORDER — ALBUTEROL SULFATE (2.5 MG/3ML) 0.083% IN NEBU: 2.5000 mg | INHALATION_SOLUTION | RESPIRATORY_TRACT | Status: DC | PRN

## 2012-01-11 MED ORDER — TIOTROPIUM BROMIDE MONOHYDRATE 18 MCG IN CAPS: 18.0000 ug | ORAL_CAPSULE | Freq: Every day | RESPIRATORY_TRACT | Status: DC

## 2012-01-11 MED ORDER — ALBUTEROL SULFATE HFA 108 (90 BASE) MCG/ACT IN AERS: 2.0000 | INHALATION_SPRAY | RESPIRATORY_TRACT | Status: DC | PRN

## 2012-01-11 NOTE — Telephone Encounter (Signed)
Spoke with pt and notified of recs per VS. He verbalized understanding, states that he still wants [place] to send rx for # 6 inhalers. Rx was sent to pharm.

## 2012-01-11 NOTE — Telephone Encounter (Signed)
Okay to sent script per patient request.  However, advise him that this is subject to insurance approval, and his insurance may not allow 6 ventolin inhalers to dispensed at a time.

## 2012-01-11 NOTE — Telephone Encounter (Signed)
Spoke with pt. He is requesting 90 day supply on symbicort, spiriva, nasacort, albuterol neb sol, and also for ventolin. He states will need 6 boxes of ventolin for 90 day supply. He states that this is what he always gets. I advised that I can refill his other meds as requested, but the ventolin should only be for 3 inhalers per 90 day supply. I reminded him this med is for prn use only. He verbalized understanding. VS, please advise if okay to send # 6 inhalers like the pt insists on getting thanks!

## 2012-01-11 NOTE — Telephone Encounter (Signed)
Contacted patient. Med refills sent in to patients pharmacy, patient aware.

## 2012-01-11 NOTE — Telephone Encounter (Signed)
Pt needs a 90 day supply of the following medications:    Nasacort-3 boxes for 90 days (only rec'd 1) Spiriva - 3 boxes for 90 days (only rec'd 1) Albuterol neb-22 boxes (only rec'd 1) Ventolin -6 boxes (did not receive anything) Symbicort - 3 boxes  (did not receive anything)  [facility] [address]  Please advise when this has been complete.  Antionette Fairy

## 2012-01-13 ENCOUNTER — Telehealth: Payer: Self-pay | Admitting: Pulmonary Disease

## 2012-01-13 NOTE — Telephone Encounter (Signed)
Called, spoke with pt. [facility] to [facility] 3 mo rxs for spiriva, albuterol neb, and nasacort that were sent by [place] on 01/11/12 but was told they do not have these rxs for a 3 month supply for these meds and instead, was given a 30 day supply of these meds.  States he will keep the 30 day supply this time but would like to get the 3 months next time.  [facility], spoke with Brandy who states they do have these rxs for a 3 month supply on file for pt.  I asked to please make sure they are showing up on pt's chart as he was told they did not have it and would like the 3 mo rxs next month.  Brandy verbalized understanding.  I called pt and apologized for the confusion.  I informed [facility] who verified they do [facility] the 3 month rxs for spiriva, albuterol neb, and nasacort and was told they would ensure he did receive the 3 month supply next week.  He verbalized understanding of this.

## 2012-02-11 ENCOUNTER — Telehealth: Payer: Self-pay | Admitting: Pulmonary Disease

## 2012-02-11 MED ORDER — ALBUTEROL SULFATE (2.5 MG/3ML) 0.083% IN NEBU: 2.5000 mg | INHALATION_SOLUTION | RESPIRATORY_TRACT | Status: DC | PRN

## 2012-02-11 NOTE — Telephone Encounter (Signed)
Pt states that he is now receiving 90 day supply on all of his medications, even his nebulizer medication and that we only sent Rx for 30 day supply. He is asking that this be corrected so he can pick up 3 of his medications later today. Pt made aware this is correct at the pharmacy.

## 2012-02-11 NOTE — Telephone Encounter (Signed)
Pt stated that he was only give one month instead of 90 day month supply.  Pt stated he is taking 6 per day & is very frustrated & confused.  Would like our help getting this all straightened out.  Pt stated he is unsure if we prescribed the wrong amount or it the pharmacy filled this incorrectly.  [name]

## 2012-02-25 ENCOUNTER — Telehealth: Payer: Self-pay | Admitting: Pulmonary Disease

## 2012-02-25 NOTE — Telephone Encounter (Signed)
I spoke with pt and is aware we are awaiting VS's okay. He was fine with this. Please advise Dr. Craige Cotta, thanks

## 2012-02-25 NOTE — Telephone Encounter (Signed)
Faxed refill request received for Lorazepam 0.5mg . Last given 10/25/11 for #30 with 2 refills. Pt was also seen on that date by Dr. Craige Cotta and then again by TP on 11/19/11. Pls advise on refills for this medication. Allergies  Allergen Reactions  . Penicillins

## 2012-02-25 NOTE — Telephone Encounter (Signed)
Pt would like to know the status of this refill & asked to be reached at [phone].  [name]

## 2012-02-28 MED ORDER — LORAZEPAM 0.5 MG PO TABS: 0.5000 mg | ORAL_TABLET | Freq: Three times a day (TID) | ORAL | Status: DC | PRN

## 2012-02-28 NOTE — Telephone Encounter (Signed)
[facility] and [facility] aware. Nothing further was needed

## 2012-02-28 NOTE — Telephone Encounter (Signed)
Okay to refill lorazepam 0.5 mg to be used every 8 hours as needed.  Dispense 30 with 2 refills.

## 2012-03-02 ENCOUNTER — Telehealth: Payer: Self-pay | Admitting: Pulmonary Disease

## 2012-03-02 NOTE — Telephone Encounter (Signed)
Spoke to this pt he is going to let [facility] take care his 02 needs Tobe Sos

## 2012-03-02 NOTE — Telephone Encounter (Signed)
Pt currently get his oxygen through [facility] in [place] and they are no longer going to deal with oxygen. They have sent all of his information to [facility] and the pt does not want [facility] if possible. The pt states that through [facility], a company by the name of [employer] comes to his home to fill his tanks and he wants to keep them if he can. [employer] is out of [place], [phone]. I told the pt we would have [facility] check into this for him and then give him a call back. Pt says [facility] is supposed to come out on Mon., 3/25 to bring their tanks. Pls advise.

## 2012-03-16 ENCOUNTER — Telehealth: Payer: Self-pay | Admitting: Pulmonary Disease

## 2012-03-16 NOTE — Telephone Encounter (Signed)
Spoke with patient and he stated that [employer] is going to stop providing oxygen for anyone. Order was sent to [facility] which provided liquid o2 shoulder bags. Pt can't use shoulder bags due to having to have surgery on his shoulder. Pt has a [employer] 850 Back Pack from [place] which works well for him. Advised patient that I would contact [facility] tomorrow morning b/c it was 5:00 pm when message was received. Pt stated that [employer] stated that would be picking up their o2 equipment in the morning. I advised patient that if he didn't have a portable device from [facility] that he could use, not to let [employer] pick up his oxygen. [facility] can't pick up his oxygen without another system being in place.  Pt is aware and aware that we will contact [facility] first thing in the morning. Rhonda J Cobb

## 2012-03-17 NOTE — Telephone Encounter (Signed)
Spoke with [place] and she stated that she would see if [facility] could go out there today or first of next week to see what patient currently has and to see if [facility] can provide the same or something similar. Called and spoke with patient and he is aware of this. Rhonda J Cobb

## 2012-03-17 NOTE — Telephone Encounter (Signed)
Called and LMOAM for [name] with [facility] to check on the same portable o2 device and back pack to see if [facility] can provide the same equipment for patient. Advised that [facility] was wanting to pick up their equipment, however, he doesn't have a portable device he can use with [facility].

## 2012-03-20 ENCOUNTER — Telehealth: Payer: Self-pay | Admitting: Pulmonary Disease

## 2012-03-20 NOTE — Telephone Encounter (Signed)
Error.  Duplicate message.  [name] °- °

## 2012-03-20 NOTE — Telephone Encounter (Signed)
Pt was set up with D tanks over the weekend through [facility]. [facility] will be going back out there first part of the week to arrange a system like patient has requested. Pt has the liquid o2 per pt's request. [name]

## 2012-03-20 NOTE — Telephone Encounter (Signed)
Pt need a CB. He says this is critical. The o2 that he has left is the only one that stays at home. He does not have a travel o2 anymore. [facility] said they never got an update from [place].

## 2012-03-20 NOTE — Telephone Encounter (Signed)
Spoke with [place] and she stated that [facility] would be going out there tomorrow morning and [facility] will be calling patient tonight. Asked patient to get a name of who he is speaking with and that I would contact him first thing in the am to see if he has been provided a back pack bag as a carrier. Pt has o2 but doesn't have a way to carry it. Rhonda J Cobb

## 2012-03-20 NOTE — Telephone Encounter (Signed)
Pt stated he has not gotten a call from [facility] & is requesting an update.  Pt can be reached at [phone].  Antionette Fairy

## 2012-03-21 NOTE — Telephone Encounter (Signed)
Pt called back for [name] stated that he spoke to [name] who told him that [name] (pt liaison for [facility]) told her [name]) that they [facility]) are awaiting for the order w/ VS's signature which is still sitting on VS's desk.  Pt is very upset & feels as though he is getting the run around.  Would like to speak w/ [name] & asks for help getting everything straightened out.  Pt stated that [facility] did not call him last night.  Pt stated he had to call them today.  Spoke w/ someone named [name] who was helpful, however, when he finally got a call back, he spoke w/ [name] & can't figure out what's really going on.  Please advise.  [name]

## 2012-03-21 NOTE — Telephone Encounter (Signed)
Called and tried to reach [place] R at [facility]. The Resp Dept at [facility] stated that she was out in the field. Spoke with Kayla at [facility] and advised her that we faxed the form that  [facility] was requesting. Dorathy Daft stated that they would get the patient called and I asked Kayla to contact the patient today on his cell phone and get this set up for him ASAP. Called and spoke with patient and advised him of the above. Advised patient that I would be calling him in the morning to see if an appointment for the liquid o2 has been scheduled. Pt has a concentrator, and a reservoir to fill any portable tanks. Pt just didn't have a back pack to carry them in. Pt is requesting a Marine scientist with a back pack. Will contact patient in the morning and hopefully will be able to close out this note.

## 2012-03-22 ENCOUNTER — Telehealth: Payer: Self-pay | Admitting: Pulmonary Disease

## 2012-03-22 NOTE — Telephone Encounter (Signed)
Per patient, he was calling to give [name] an update.  Called spoke with patient who stated that [facility] dropped off his liquid 02 today and that [facility] is supposed to contact [employer] regarding a dolly to make transport easier.  Also, pt has a neb and portable neb and CPAP thru [facility] and thinks that he may be able to keep these even though they are from a different DME - is this correct?  His concentrator is "very loud" and is unable to sleep with it in his room, but reports that he is okay with this for right now.  Pt stated that he is very happy and impressed with [facility] thus far.  Will forward to [facility] to make her aware per pt's request.

## 2012-03-22 NOTE — Telephone Encounter (Signed)
Called and spoke with patient and [facility] is on their way out there to deliver pt's portable liquid o2. Pt stated that he appreciated everything that has been done and thanked everyone. Advised patient that I was closing this note out since everything has been addressed. Pt stated that they were suppose to bring the back pack with them today. Advised patient that if he needed anything else to contact us.

## 2012-03-23 NOTE — Telephone Encounter (Signed)
Pt called and advised that these issues have been addressed with [facility]. [facility] will check and make sure he owns the below equipment. Pt advised that he will hear back from [facility] and that they will replace noisy concentrator. Pt advised to call us if anything else is needed.

## 2012-03-23 NOTE — Telephone Encounter (Signed)
Spoke with [place] with [facility] and advised her about pt's concentrator being loud. [facility] will work on getting this replaced for patient. Mayra Reel will also check on pt's nebulizer, portable nebulizer and CPAP. Pt probably already owns them but she will check on this as well. Waiting on [place]. Also thanked [place] and asked her to thank Granville Lewis. With [facility] for all their efforts.

## 2012-04-06 ENCOUNTER — Other Ambulatory Visit: Payer: Self-pay | Admitting: Pulmonary Disease

## 2012-04-07 ENCOUNTER — Ambulatory Visit: Payer: 59 | Admitting: Pulmonary Disease

## 2012-04-10 ENCOUNTER — Other Ambulatory Visit: Payer: Self-pay | Admitting: Pulmonary Disease

## 2012-04-10 ENCOUNTER — Telehealth: Payer: Self-pay | Admitting: Pulmonary Disease

## 2012-04-10 MED ORDER — ALBUTEROL SULFATE HFA 108 (90 BASE) MCG/ACT IN AERS: 2.0000 | INHALATION_SPRAY | RESPIRATORY_TRACT | Status: DC | PRN

## 2012-04-10 NOTE — Telephone Encounter (Signed)
I spoke with pt and he states he needed a new rx for [facility]olin [facility] He states he needs this for 90 day supply. I have sent new rx and nothing further was needed

## 2012-04-11 ENCOUNTER — Telehealth: Payer: Self-pay | Admitting: Pulmonary Disease

## 2012-04-11 MED ORDER — ALBUTEROL SULFATE HFA 108 (90 BASE) MCG/ACT IN AERS: 2.0000 | INHALATION_SPRAY | RESPIRATORY_TRACT | Status: DC | PRN

## 2012-04-11 NOTE — Telephone Encounter (Signed)
Okay to send order as requested by patient.

## 2012-04-11 NOTE — Telephone Encounter (Signed)
I spoke with pt and he states that his ventolin rx needs to be sent in as #6 inhalers instead of #3 inhalers. Pt states VS usually approves this. Dr. Craige Cotta are you okay with this. Pt states he has not opened his current inhalers and can take them back but needs new rx sent to the pharmacy w/ #6 inhalers.  Please advise VS if you are okay with this thanks

## 2012-04-11 NOTE — Telephone Encounter (Signed)
I spoke with pt and is aware new rx has been sent and needed nothing further

## 2012-04-26 ENCOUNTER — Ambulatory Visit (INDEPENDENT_AMBULATORY_CARE_PROVIDER_SITE_OTHER): Payer: 59 | Admitting: Pulmonary Disease

## 2012-04-26 ENCOUNTER — Encounter: Payer: Self-pay | Admitting: Pulmonary Disease

## 2012-04-26 VITALS — BP 122/76 | HR 83 | Temp 98.3°F | Ht 64.0 in | Wt 240.6 lb

## 2012-04-26 DIAGNOSIS — R0902 Hypoxemia: Secondary | ICD-10-CM

## 2012-04-26 DIAGNOSIS — J310 Chronic rhinitis: Secondary | ICD-10-CM

## 2012-04-26 DIAGNOSIS — G4733 Obstructive sleep apnea (adult) (pediatric): Secondary | ICD-10-CM

## 2012-04-26 DIAGNOSIS — J4489 Other specified chronic obstructive pulmonary disease: Secondary | ICD-10-CM

## 2012-04-26 DIAGNOSIS — J449 Chronic obstructive pulmonary disease, unspecified: Secondary | ICD-10-CM

## 2012-04-26 MED ORDER — LORAZEPAM 0.5 MG PO TABS: 0.5000 mg | ORAL_TABLET | Freq: Three times a day (TID) | ORAL | Status: DC | PRN

## 2012-04-26 MED ORDER — BUDESONIDE-FORMOTEROL FUMARATE 160-4.5 MCG/ACT IN AERO: 2.0000 | INHALATION_SPRAY | Freq: Two times a day (BID) | RESPIRATORY_TRACT | Status: DC

## 2012-04-26 MED ORDER — TIOTROPIUM BROMIDE MONOHYDRATE 18 MCG IN CAPS: 18.0000 ug | ORAL_CAPSULE | Freq: Every day | RESPIRATORY_TRACT | Status: DC

## 2012-04-26 MED ORDER — TRIAMCINOLONE ACETONIDE(NASAL) 55 MCG/ACT NA INHA: 2.0000 | Freq: Two times a day (BID) | NASAL | Status: DC

## 2012-04-26 MED ORDER — ALBUTEROL SULFATE (2.5 MG/3ML) 0.083% IN NEBU: 2.5000 mg | INHALATION_SOLUTION | RESPIRATORY_TRACT | Status: DC | PRN

## 2012-04-26 MED ORDER — MONTELUKAST SODIUM 10 MG PO TABS: 10.0000 mg | ORAL_TABLET | Freq: Every day | ORAL | Status: DC

## 2012-04-26 MED ORDER — ACAPELLA MISC: Status: DC

## 2012-04-26 MED ORDER — ALBUTEROL SULFATE HFA 108 (90 BASE) MCG/ACT IN AERS: 2.0000 | INHALATION_SPRAY | RESPIRATORY_TRACT | Status: DC | PRN

## 2012-04-26 NOTE — Assessment & Plan Note (Signed)
Likely has progressive worsening due to allergies.  Discussed environmental controls.  He is to continue nasal irrigation, nasacort, and claritin.  Will add singulair to his regimen.  Advised to limit his use of afrin.

## 2012-04-26 NOTE — Progress Notes (Signed)
Chief Complaint  Patient presents with  . Follow-up    Breathing has been okay--still usning 3l oxygen and using his breathing tx every 4 hrs. coughs up milky phlem in the AM's and some at bedtime. If he eats a lot he has wheezing and chest tx    History of Present Illness: [name] is a 61 y.o. male former smoker GOLD 3 COPD, hypoxemia, and Severe OSA.  He has been doing okay.  He still has cough with clear to milky sputum.  This seems to happen mostly in the morning just after he wakes up or during the night.    He thinks his allergies have been flaring up.  He is getting more sinus congestion with clear drainage.  He has been using claritin and nasacort daily.  He will also occasionally use afrin.  He has been using nasal irrigation.  He needs a new flutter valve.  He uses his CPAP 4 to 5 hours per night.  This helps, but has trouble due to his sinuses.  He changed his DME to [facility].  He is going to switch to medicare in June.   Past Medical History  Diagnosis Date  . COPD (chronic obstructive pulmonary disease)   . OSA (obstructive sleep apnea)   . Anxiety   . Dyspnea     No past surgical history on file.  Allergies  Allergen Reactions  . Penicillins     Physical Exam:  Blood pressure 122/76, pulse 83, temperature 98.3 F (36.8 C), temperature source Oral, height 5\' 4"  (1.626 m), weight 240 lb 9.6 oz (109.135 kg), SpO2 95.00%.  Body mass index is 41.30 kg/(m^2).   General - Obese, using oxygen HEENT - no sinus tenderness, clear nasal discharge, no oral exudate, no LAN Cardiac - s1s2 no murmur Chest - prolonged exhalation, normal respiratory excursion, no wheeze/rales Abdomen - soft, non-tender Extremities - no e/c/c Skin - no rashes Neurologic - normal strength, CN intact Psychiatric - normal mood, behavior  Assessment/Plan:    Outpatient Encounter Prescriptions as of 04/26/2012  Medication Sig Dispense Refill  . acetaminophen (TYLENOL) 500 MG tablet  Take 500 mg by mouth every 6 (six) hours as needed.        Marland Kitchen albuterol (PROVENTIL) (2.5 MG/3ML) 0.083% nebulizer solution Take 3 mLs (2.5 mg total) by nebulization every 4 (four) hours as needed.  1620 mL  1  . albuterol (VENTOLIN HFA) 108 (90 BASE) MCG/ACT inhaler Inhale 2 puffs into the lungs every 4 (four) hours as needed for wheezing.  6 Inhaler  1  . atorvastatin (LIPITOR) 10 MG tablet Take 5 mg by mouth daily.      . budesonide-formoterol (SYMBICORT) 160-4.5 MCG/ACT inhaler Inhale 2 puffs into the lungs 2 (two) times daily.  3 Inhaler  1  . dextromethorphan-guaiFENesin (MUCINEX DM) 30-600 MG per 12 hr tablet Take 1 tablet by mouth daily.       . furosemide (LASIX) 40 MG tablet Take 40 mg by mouth 2 (two) times daily.        Marland Kitchen HYDROcodone-acetaminophen (NORCO) 5-325 MG per tablet Take 2 tablets by mouth Three times daily as needed.      Marland Kitchen levothyroxine (SYNTHROID, LEVOTHROID) 200 MCG tablet Take 200 mcg by mouth daily.        Marland Kitchen loratadine (CLARITIN) 10 MG tablet Take 10 mg by mouth daily.        Marland Kitchen LORazepam (ATIVAN) 0.5 MG tablet Take 1 tablet (0.5 mg total) by mouth every 8 (  eight) hours as needed for anxiety.  30 tablet  2  . methocarbamol (ROBAXIN) 500 MG tablet Take 500 mg by mouth 2 (two) times daily.        . Misc. Devices (ACAPELLA) MISC Dx 496  1 each  0  . nabumetone (RELAFEN) 500 MG tablet Take 500 mg by mouth daily.        Marland Kitchen nystatin (MYCOSTATIN) 100000 UNIT/ML suspension Take 500,000 Units by mouth 4 (four) times daily.        . ranitidine (ZANTAC) 150 MG tablet Take 150 mg by mouth at bedtime.        . SUCRALFATE PO Take by mouth. 1 tablet  4 times day       . Tamsulosin HCl (FLOMAX) 0.4 MG CAPS Take by mouth.        . tiotropium (SPIRIVA) 18 MCG inhalation capsule Place 1 capsule (18 mcg total) into inhaler and inhale daily.  90 capsule  3  . triamcinolone (NASACORT) 55 MCG/ACT nasal inhaler Place 2 sprays into the nose 2 (two) times daily.  3 Inhaler  1  . valsartan (DIOVAN)  160 MG tablet Take 160 mg by mouth daily.        Marland Kitchen DISCONTD: atorvastatin (LIPITOR) 20 MG tablet Take 20 mg by mouth daily.       Marland Kitchen DISCONTD: predniSONE (DELTASONE) 10 MG tablet 4 tabs for 2 days, then 3 tabs for 2 days, 2 tabs for 2 days, then 1 tab for 2 days, then stop  20 tablet  0    [name] Pager:  [phone] 04/26/2012, 11:29 AM

## 2012-04-26 NOTE — Assessment & Plan Note (Signed)
He is to continue 3 liters oxygen 24/7. 

## 2012-04-26 NOTE — Assessment & Plan Note (Signed)
He may have worsening of his symptoms related to asthma/allergy with post-nasal drip.  Will give trial of singulair.  He is to continue symbicort and spiriva with prn albuterol.

## 2012-04-26 NOTE — Assessment & Plan Note (Signed)
Encouraged him to use CPAP for the entire time he is asleep.  He is to call if he needs adjustments to his mask.

## 2012-04-26 NOTE — Patient Instructions (Signed)
Singulair 10 mg pill>>take one pill nightly before bedtime Follow up in 6 months

## 2012-04-28 ENCOUNTER — Telehealth: Payer: Self-pay | Admitting: Pulmonary Disease

## 2012-04-28 DIAGNOSIS — G4733 Obstructive sleep apnea (adult) (pediatric): Secondary | ICD-10-CM

## 2012-04-28 DIAGNOSIS — J449 Chronic obstructive pulmonary disease, unspecified: Secondary | ICD-10-CM

## 2012-04-28 NOTE — Telephone Encounter (Signed)
Spoke with patient-he had O2 changed to [facility]; also wanted to have his nebulizer tubing, CPAP, and Acapella Vibratory PEP Therapy System changed to [facility] as well. Pt aware I am placing order for this. [facility] will contact him.

## 2012-05-02 ENCOUNTER — Telehealth: Payer: Self-pay | Admitting: Pulmonary Disease

## 2012-05-02 DIAGNOSIS — G4733 Obstructive sleep apnea (adult) (pediatric): Secondary | ICD-10-CM

## 2012-05-02 NOTE — Telephone Encounter (Signed)
LMTCB

## 2012-05-03 NOTE — Telephone Encounter (Signed)
[name] needs pt sleep study since he is transferring dme's. Looked in pt chart and did not see the sleep study. I ordered paper chart to see if it is in there. Will await chart.

## 2012-05-03 NOTE — Telephone Encounter (Signed)
Pt does not have a paper chart. I spoke with pt to see when he had his sleep study done and where. tp does not remember where and stated it was in the late 1990's. I called [facility] to see if they had pt's sleep study and spoke with [place]. She stated she believed he had transferred to them from another DME. She is going to check on this and call me back. Will await trisha's call back.

## 2012-05-03 NOTE — Telephone Encounter (Signed)
Spoke with [facility] and notified that pt has not had PSG done since the 90's. In this case, he will need to have another test done so insurance will pay. Please advise if okay to go ahead and order PSG, thanks!

## 2012-05-04 NOTE — Telephone Encounter (Signed)
Order was sent to [facility] for split night

## 2012-05-04 NOTE — Telephone Encounter (Signed)
Okay to order split-night sleep study. 

## 2012-05-09 ENCOUNTER — Ambulatory Visit: Payer: 59 | Admitting: Pulmonary Disease

## 2012-05-09 ENCOUNTER — Telehealth: Payer: Self-pay | Admitting: Pulmonary Disease

## 2012-05-09 DIAGNOSIS — J449 Chronic obstructive pulmonary disease, unspecified: Secondary | ICD-10-CM

## 2012-05-09 NOTE — Telephone Encounter (Signed)
Spoke with the pt and he states that he needs an order sent to [facility] for nebulizer machine for his home, portable neb machine, and supplies for both. He also needs order sent for flutter valve as well. According to phone note from 04-28-12 this order was placed at that time, however when I called [facility] and spoke with Shanda Bumps, they do not have any records of this order. She advised that I re-fax this order to [phone] now and they will start processing this order tonight.  Order faxed. Pt is aware. [name], CMA

## 2012-05-10 ENCOUNTER — Telehealth: Payer: Self-pay | Admitting: Pulmonary Disease

## 2012-05-10 NOTE — Telephone Encounter (Signed)
Called and spoke with pt and he stated that [facility] does not carry the portable nebulizer and pt is wanting to know what to do about this.  He is needing this to take with him.  i have tried to call lecretia and her phone has been forwarded.  i will try to call her in the morning to see if we can get this pt a portable neb machine as well.

## 2012-05-11 NOTE — Telephone Encounter (Signed)
Called and spoke with [name] and she stated that the pts insurance company will not cover the portable neb machine. Called and spoke with pt and he is aware that if he wants to purchase the portable neb machine that he will need to pay for this out of pocket.  Nothing further needed.

## 2012-05-25 ENCOUNTER — Ambulatory Visit (HOSPITAL_BASED_OUTPATIENT_CLINIC_OR_DEPARTMENT_OTHER): Payer: 59

## 2012-06-12 ENCOUNTER — Ambulatory Visit (HOSPITAL_BASED_OUTPATIENT_CLINIC_OR_DEPARTMENT_OTHER): Payer: Medicare Other | Attending: Pulmonary Disease | Admitting: Radiology

## 2012-06-12 DIAGNOSIS — G4730 Sleep apnea, unspecified: Secondary | ICD-10-CM | POA: Insufficient documentation

## 2012-06-12 DIAGNOSIS — G4733 Obstructive sleep apnea (adult) (pediatric): Secondary | ICD-10-CM

## 2012-06-12 DIAGNOSIS — J449 Chronic obstructive pulmonary disease, unspecified: Secondary | ICD-10-CM | POA: Insufficient documentation

## 2012-06-12 DIAGNOSIS — J4489 Other specified chronic obstructive pulmonary disease: Secondary | ICD-10-CM | POA: Insufficient documentation

## 2012-06-21 ENCOUNTER — Encounter: Payer: Self-pay | Admitting: Pulmonary Disease

## 2012-06-21 ENCOUNTER — Telehealth: Payer: Self-pay | Admitting: Pulmonary Disease

## 2012-06-21 DIAGNOSIS — J449 Chronic obstructive pulmonary disease, unspecified: Secondary | ICD-10-CM

## 2012-06-21 DIAGNOSIS — G4733 Obstructive sleep apnea (adult) (pediatric): Secondary | ICD-10-CM

## 2012-06-21 DIAGNOSIS — G4730 Sleep apnea, unspecified: Secondary | ICD-10-CM

## 2012-06-21 NOTE — Telephone Encounter (Signed)
PSG with 3 liters oxygen 06/12/12>>AHI 1.2, SpO2 low 90%, PLMI 1.  Results d/w pt over phone.  Explained that he does not have evidence for sleep apnea based on this study, and that he can stop using CPAP.  He needs to continue using supplemental oxygen.

## 2012-06-22 NOTE — Procedures (Signed)
NAME:  [name], [name]                ACCOUNT NO.:  [id number]  MEDICAL RECORD NO.:  [id number]          PATIENT TYPE:  OUT  LOCATION:  SLEEP CENTER                 FACILITY:  [facility]  PHYSICIAN:  Coralyn Helling, MD        DATE OF BIRTH:  01-26-1951  DATE OF STUDY:  06/12/2012                           NOCTURNAL POLYSOMNOGRAM  REFERRING PHYSICIAN:  Coralyn Helling, MD  INDICATION FOR STUDY:  [name] is a 61 year old male who has a history of sleep apnea and COPD.  He has been on CPAP therapy and supplemental oxygen.  He required renewal of sleep study to continue therapy with the CPAP.  His original sleep study was done in the 1990s.  He had been on CPAP since then.  Height is 5 feet 4 inches, weight is 235 pounds.  BMI is 40.  Neck size 19 inches.  EPWORTH SLEEPINESS SCORE:  22.  MEDICATIONS:  Tylenol, Proventil, Lipitor, Symbicort, Mucinex, Lasix, Norco, levothyroxine, Claritin, Ativan, Robaxin, Relafen, nystatin, Zantac, Carafate, Flomax, Spiriva, Nasacort, Diovan, and Singulair.  SLEEP ARCHITECTURE:  Total recording time is 394 minutes.  Total sleep time was 355 minutes.  Sleep efficiency was 92%.  Sleep latency 6 minutes.  REM latency is 47 minutes.  The study was notable for lack of stage III sleep.  The patient slept in both supine and non-supine positions.  RESPIRATORY DATA:  The average respiratory rate was 18.  Moderate snoring was noted by the technician.  The overall apnea-hypopnea index was 1.2.  The respiratory disturbance index was 3.7.  There were 4 central apneic events.  The remainder of the events were obstructive in nature.  OXYGEN DATA:  The baseline oxygenation was 90%.  The oxygen saturation nadir was 90%.  The study was conducted with the use of 3 L of supplemental oxygen.  CARDIAC DATA:  The average heart rate is 73 and the rhythm strip showed normal sinus rhythm.  MOVEMENT-PARASOMNIA:  The periodic limb movement index was 1 and the patient had no restroom  trips.  IMPRESSIONS-RECOMMENDATIONS:  This study did not show evidence for obstructive sleep apnea.  He had good control of his oxygenation with the use of 33 L of supplemental oxygen.  Based on the results of the sleep study, the patient should be able to forego continued use of CPAP therapy.     Coralyn Helling, MD Diplomat, American Board of Sleep Medicine    VS/MEDQ  D:  06/21/2012 18:13:15  T:  06/22/2012 06:13:33  Job:  [id number]

## 2012-08-15 ENCOUNTER — Telehealth: Payer: Self-pay | Admitting: Pulmonary Disease

## 2012-08-15 MED ORDER — BUDESONIDE-FORMOTEROL FUMARATE 160-4.5 MCG/ACT IN AERO: 2.0000 | INHALATION_SPRAY | Freq: Two times a day (BID) | RESPIRATORY_TRACT | Status: DC

## 2012-08-15 NOTE — Telephone Encounter (Signed)
lmomtcb x1 for pt 

## 2012-08-15 NOTE — Telephone Encounter (Signed)
Spoke with pt. He states needs [facility] he send his mail order rxs out. He requests a 90 day supply. Rx was sent to pharm and pt states nothing further needed.

## 2012-09-27 ENCOUNTER — Ambulatory Visit (HOSPITAL_COMMUNITY): Payer: Medicare Other | Attending: Cardiology | Admitting: Radiology

## 2012-09-27 ENCOUNTER — Other Ambulatory Visit (HOSPITAL_COMMUNITY): Payer: Self-pay | Admitting: Family Medicine

## 2012-09-27 ENCOUNTER — Other Ambulatory Visit: Payer: Self-pay

## 2012-09-27 DIAGNOSIS — I369 Nonrheumatic tricuspid valve disorder, unspecified: Secondary | ICD-10-CM | POA: Insufficient documentation

## 2012-09-27 DIAGNOSIS — J4489 Other specified chronic obstructive pulmonary disease: Secondary | ICD-10-CM | POA: Insufficient documentation

## 2012-09-27 DIAGNOSIS — R609 Edema, unspecified: Secondary | ICD-10-CM | POA: Insufficient documentation

## 2012-09-27 DIAGNOSIS — J449 Chronic obstructive pulmonary disease, unspecified: Secondary | ICD-10-CM | POA: Insufficient documentation

## 2012-09-27 DIAGNOSIS — I10 Essential (primary) hypertension: Secondary | ICD-10-CM | POA: Insufficient documentation

## 2012-09-28 ENCOUNTER — Encounter (HOSPITAL_COMMUNITY): Payer: Self-pay | Admitting: Family Medicine

## 2012-10-03 ENCOUNTER — Telehealth: Payer: Self-pay | Admitting: Pulmonary Disease

## 2012-10-03 MED ORDER — ALBUTEROL SULFATE HFA 108 (90 BASE) MCG/ACT IN AERS: 2.0000 | INHALATION_SPRAY | Freq: Four times a day (QID) | RESPIRATORY_TRACT | Status: DC | PRN

## 2012-10-03 NOTE — Telephone Encounter (Signed)
PA request form received from [facility] on Ventolin HFA.  There was a question on the form asking if the pt has tried and failed Proair. I called the pt and he has never tried [employer] account planner and is willing to do so. I am leaving 1 sample for the pt to pick up so he may try the Proair before having to pay for the prescription. Pt will contact our office if he has any problems on the new SABA. I will forward to VS so he is aware. Proair added to med list and Ventolin removed.

## 2012-10-03 NOTE — Telephone Encounter (Signed)
Noted  

## 2012-10-04 ENCOUNTER — Telehealth: Payer: Self-pay | Admitting: Pulmonary Disease

## 2012-10-04 NOTE — Telephone Encounter (Signed)
Ethanol is used in a very small amount to act as a propellant  Over the yrs inhalers have been changed to be more safe for pt's to use I spoke with the pt and notified of this and he verbalized understanding, states will try med and will call if has any issues

## 2012-11-01 ENCOUNTER — Ambulatory Visit (INDEPENDENT_AMBULATORY_CARE_PROVIDER_SITE_OTHER): Payer: Medicare Other | Admitting: Pulmonary Disease

## 2012-11-01 ENCOUNTER — Encounter: Payer: Self-pay | Admitting: Pulmonary Disease

## 2012-11-01 VITALS — BP 134/82 | HR 122 | Temp 98.1°F | Ht 64.0 in | Wt 247.6 lb

## 2012-11-01 DIAGNOSIS — J449 Chronic obstructive pulmonary disease, unspecified: Secondary | ICD-10-CM

## 2012-11-01 DIAGNOSIS — I10 Essential (primary) hypertension: Secondary | ICD-10-CM

## 2012-11-01 DIAGNOSIS — J310 Chronic rhinitis: Secondary | ICD-10-CM

## 2012-11-01 DIAGNOSIS — J9610 Chronic respiratory failure, unspecified whether with hypoxia or hypercapnia: Secondary | ICD-10-CM

## 2012-11-01 DIAGNOSIS — J9611 Chronic respiratory failure with hypoxia: Secondary | ICD-10-CM

## 2012-11-01 DIAGNOSIS — R0902 Hypoxemia: Secondary | ICD-10-CM

## 2012-11-01 DIAGNOSIS — J4489 Other specified chronic obstructive pulmonary disease: Secondary | ICD-10-CM

## 2012-11-01 MED ORDER — AZITHROMYCIN 250 MG PO TABS: ORAL_TABLET | ORAL | Status: AC

## 2012-11-01 MED ORDER — ALBUTEROL SULFATE HFA 108 (90 BASE) MCG/ACT IN AERS: 2.0000 | INHALATION_SPRAY | Freq: Four times a day (QID) | RESPIRATORY_TRACT | Status: DC | PRN

## 2012-11-01 NOTE — Assessment & Plan Note (Signed)
He is to continue 3 liters oxygen 24/7. 

## 2012-11-01 NOTE — Assessment & Plan Note (Signed)
Advised him that he should take his potassium with lasix as prescribed by PCP.  He reports that he will have lab work with PCP next week.

## 2012-11-01 NOTE — Assessment & Plan Note (Signed)
He is to continue nasal irrigation, nasacort, singulair and claritin.     

## 2012-11-01 NOTE — Assessment & Plan Note (Addendum)
He has slow to resolve bronchitis.  Will give him course of zithromax.  I don't think he needs prednisone or chest xray at this time.  Advised him to defer flu shot until he is feeling better.

## 2012-11-01 NOTE — Patient Instructions (Signed)
Zithromax 250 mg pill >> 2 pills on first day, then 1 pill daily for 4 days Call if not feeling better Follow up in 6 months

## 2012-11-01 NOTE — Progress Notes (Signed)
Chief Complaint  Patient presents with  . COPD    c/o congestion-taking mucnex 1200 BID. cough has unchanged, chest tx. increase SOB x 3 weeks. using 3 liters oxygen 24/7    History of Present Illness: [name] is a 61 y.o. male former smoker with GOLD 3 COPD, and hypoxia  He has been feeling more short of breath for the past 3 weeks.  He has more cough, chest tightness, and sputum.  He has been using mucinex more often.  He had his lasix increased one month ago for leg swelling.    Tests: PFT 12/24/09>>FEV1 1.60(57%), FEV1% 52, TLC 6.86(130%), DLCO 67%, no BD PSG with 3 liters oxygen 06/12/12>>AHI 1.2, SpO2 low 90%, PLMI 1. Echo 09/27/12>>EF 55 to 60%, mild RA dilation  Past Medical History  Diagnosis Date  . COPD (chronic obstructive pulmonary disease)   . Chronic respiratory failure with hypoxia   . Anxiety   . Dyspnea   . Hypothyroidism   . Hypertension   . Hyperlipidemia   . Osteoarthritis   . BPH (benign prostatic hyperplasia)     No past surgical history on file.  Current Outpatient Prescriptions on File Prior to Visit  Medication Sig Dispense Refill  . acetaminophen (TYLENOL) 500 MG tablet Take 500 mg by mouth every 6 (six) hours as needed.        Marland Kitchen albuterol (PROAIR HFA) 108 (90 BASE) MCG/ACT inhaler Inhale 2 puffs into the lungs every 6 (six) hours as needed for wheezing.  1 Inhaler  0  . albuterol (PROVENTIL) (2.5 MG/3ML) 0.083% nebulizer solution Take 3 mLs (2.5 mg total) by nebulization every 4 (four) hours as needed.  1080 mL  3  . atorvastatin (LIPITOR) 10 MG tablet Take 5 mg by mouth daily.      . budesonide-formoterol (SYMBICORT) 160-4.5 MCG/ACT inhaler Inhale 2 puffs into the lungs 2 (two) times daily.  3 Inhaler  1  . furosemide (LASIX) 40 MG tablet Take 80 mg by mouth 2 (two) times daily.       Marland Kitchen HYDROcodone-acetaminophen (NORCO) 5-325 MG per tablet Take 2 tablets by mouth Three times daily as needed.      Marland Kitchen levothyroxine (SYNTHROID, LEVOTHROID) 200  MCG tablet Take 200 mcg by mouth daily.        Marland Kitchen loratadine (CLARITIN) 10 MG tablet Take 10 mg by mouth daily.        Marland Kitchen LORazepam (ATIVAN) 0.5 MG tablet Take 1 tablet (0.5 mg total) by mouth every 8 (eight) hours as needed for anxiety.  270 tablet  2  . methocarbamol (ROBAXIN) 500 MG tablet Take 500 mg by mouth 2 (two) times daily.        . Misc. Devices (ACAPELLA) MISC Dx 496  1 each  0  . montelukast (SINGULAIR) 10 MG tablet Take 1 tablet (10 mg total) by mouth at bedtime.  90 tablet  3  . nabumetone (RELAFEN) 500 MG tablet Take 500 mg by mouth daily.        Marland Kitchen nystatin (MYCOSTATIN) 100000 UNIT/ML suspension Take 500,000 Units by mouth 4 (four) times daily as needed.       . ranitidine (ZANTAC) 150 MG tablet Take 150 mg by mouth at bedtime.        . SUCRALFATE PO Take by mouth. 1 tablet  4 times day       . Tamsulosin HCl (FLOMAX) 0.4 MG CAPS Take by mouth.        . tiotropium (SPIRIVA) 18 MCG  inhalation capsule Place 1 capsule (18 mcg total) into inhaler and inhale daily.  90 capsule  4  . triamcinolone (NASACORT) 55 MCG/ACT nasal inhaler Place 2 sprays into the nose 2 (two) times daily.  3 Inhaler  3  . valsartan (DIOVAN) 160 MG tablet Take 160 mg by mouth daily.        . [DISCONTINUED] montelukast (SINGULAIR) 10 MG tablet Take 1 tablet (10 mg total) by mouth at bedtime.  30 tablet  1    Allergies  Allergen Reactions  . Penicillins     Physical Exam: Filed Vitals:   11/01/12 1550  BP: 134/82  Pulse: 122  Temp: 98.1 F (36.7 C)  TempSrc: Oral  Height: 5\' 4"  (1.626 m)  Weight: 247 lb 9.6 oz (112.311 kg)  SpO2: 93%  ,  Current Encounter SPO2  11/01/12 1550 93%  04/26/12 1125 95%  11/19/11 1113 94%    Wt Readings from Last 3 Encounters:  11/01/12 247 lb 9.6 oz (112.311 kg)  06/12/12 235 lb (106.595 kg)  04/26/12 240 lb 9.6 oz (109.135 kg)    Body mass index is 42.50 kg/(m^2).   General - No distress, wearin oxygen ENT - No sinus tenderness, no oral exudate, no  LAN Cardiac - s1s2 regular, no murmur Chest - Prolonged exhalation, decreased breath sounds, no wheeze/rales Back - No focal tenderness Abd - Soft, non-tender Ext - ankle edema Neuro - Normal strength Skin - No rashes Psych - Normal mood, and behavior.   Assessment/Plan:  Coralyn Helling, MD [place] [facility] Pager:  [phone] 11/01/2012, 4:03 PM

## 2012-11-08 ENCOUNTER — Telehealth: Payer: Self-pay | Admitting: Pulmonary Disease

## 2012-11-08 MED ORDER — PREDNISONE 10 MG PO TABS: ORAL_TABLET | ORAL | Status: DC

## 2012-11-08 NOTE — Telephone Encounter (Signed)
Would call in prednisone taper. 40mg  qd for 2 days, then 30mg  for 2days, then 20 for 2 days, then 10mg  for 2 days, then stop

## 2012-11-08 NOTE — Telephone Encounter (Signed)
Spoke with patient. Seen last week 11/01/12 given Zpak--pt c/o congestion and chest tightness that has not gone away. Pt denies any sob, wheezing out of the normal for him.  Patient asking for additional abx or recs per Dr.   [facility]   Allergies  Allergen Reactions  . Penicillins    Patient aware that Dr Craige Cotta not in office and this message will be addressed by the Doc of the Day.  Dr Shelle Iron please advice. Thanks.

## 2012-11-08 NOTE — Telephone Encounter (Signed)
Patient aware that medication called into pharm. Pred taper 10mg . Sent to [facility].

## 2012-11-27 ENCOUNTER — Telehealth: Payer: Self-pay | Admitting: Pulmonary Disease

## 2012-11-27 MED ORDER — ALBUTEROL SULFATE HFA 108 (90 BASE) MCG/ACT IN AERS: 2.0000 | INHALATION_SPRAY | Freq: Four times a day (QID) | RESPIRATORY_TRACT | Status: DC | PRN

## 2012-11-27 NOTE — Telephone Encounter (Signed)
Spoke with pt to verify the msg Per VS okay to send 2 inhalers per month Rx was sent to pharm

## 2012-12-07 ENCOUNTER — Telehealth: Payer: Self-pay | Admitting: Pulmonary Disease

## 2012-12-07 MED ORDER — ALBUTEROL SULFATE HFA 108 (90 BASE) MCG/ACT IN AERS: 2.0000 | INHALATION_SPRAY | Freq: Four times a day (QID) | RESPIRATORY_TRACT | Status: DC | PRN

## 2012-12-07 NOTE — Telephone Encounter (Signed)
Rx has been sent in to [facility]. Pt is aware.

## 2013-02-28 ENCOUNTER — Telehealth: Payer: Self-pay | Admitting: Family Medicine

## 2013-02-28 MED ORDER — POTASSIUM CHLORIDE CRYS ER 20 MEQ PO TBCR: 20.0000 meq | EXTENDED_RELEASE_TABLET | Freq: Every day | ORAL | Status: DC

## 2013-02-28 MED ORDER — FUROSEMIDE 40 MG PO TABS: 40.0000 mg | ORAL_TABLET | Freq: Two times a day (BID) | ORAL | Status: DC

## 2013-02-28 NOTE — Telephone Encounter (Signed)
Done

## 2013-02-28 NOTE — Telephone Encounter (Signed)
done

## 2013-04-04 ENCOUNTER — Telehealth: Payer: Self-pay | Admitting: Family Medicine

## 2013-04-04 MED ORDER — LEVOTHYROXINE SODIUM 200 MCG PO TABS: 200.0000 ug | ORAL_TABLET | Freq: Every day | ORAL | Status: DC

## 2013-04-04 NOTE — Telephone Encounter (Signed)
Rx Refilled  

## 2013-04-06 ENCOUNTER — Ambulatory Visit (INDEPENDENT_AMBULATORY_CARE_PROVIDER_SITE_OTHER): Payer: Medicare Other | Admitting: Pulmonary Disease

## 2013-04-06 ENCOUNTER — Telehealth: Payer: Self-pay | Admitting: Pulmonary Disease

## 2013-04-06 ENCOUNTER — Encounter: Payer: Self-pay | Admitting: Pulmonary Disease

## 2013-04-06 VITALS — BP 124/76 | HR 102 | Temp 98.6°F | Ht 64.0 in | Wt 245.0 lb

## 2013-04-06 DIAGNOSIS — J9610 Chronic respiratory failure, unspecified whether with hypoxia or hypercapnia: Secondary | ICD-10-CM

## 2013-04-06 DIAGNOSIS — J019 Acute sinusitis, unspecified: Secondary | ICD-10-CM | POA: Insufficient documentation

## 2013-04-06 DIAGNOSIS — J9611 Chronic respiratory failure with hypoxia: Secondary | ICD-10-CM

## 2013-04-06 DIAGNOSIS — J310 Chronic rhinitis: Secondary | ICD-10-CM

## 2013-04-06 DIAGNOSIS — R0902 Hypoxemia: Secondary | ICD-10-CM

## 2013-04-06 DIAGNOSIS — J449 Chronic obstructive pulmonary disease, unspecified: Secondary | ICD-10-CM

## 2013-04-06 MED ORDER — BUDESONIDE-FORMOTEROL FUMARATE 160-4.5 MCG/ACT IN AERO: 2.0000 | INHALATION_SPRAY | Freq: Two times a day (BID) | RESPIRATORY_TRACT | Status: DC

## 2013-04-06 MED ORDER — LORAZEPAM 0.5 MG PO TABS: 0.5000 mg | ORAL_TABLET | Freq: Three times a day (TID) | ORAL | Status: AC | PRN

## 2013-04-06 MED ORDER — TRIAMCINOLONE ACETONIDE(NASAL) 55 MCG/ACT NA INHA: 2.0000 | Freq: Two times a day (BID) | NASAL | Status: DC

## 2013-04-06 MED ORDER — ALBUTEROL SULFATE HFA 108 (90 BASE) MCG/ACT IN AERS: 2.0000 | INHALATION_SPRAY | Freq: Four times a day (QID) | RESPIRATORY_TRACT | Status: DC | PRN

## 2013-04-06 MED ORDER — MONTELUKAST SODIUM 10 MG PO TABS: 10.0000 mg | ORAL_TABLET | Freq: Every day | ORAL | Status: DC

## 2013-04-06 MED ORDER — ALBUTEROL SULFATE (2.5 MG/3ML) 0.083% IN NEBU: 2.5000 mg | INHALATION_SOLUTION | Freq: Four times a day (QID) | RESPIRATORY_TRACT | Status: DC | PRN

## 2013-04-06 MED ORDER — AZITHROMYCIN 250 MG PO TABS: ORAL_TABLET | ORAL | Status: AC

## 2013-04-06 MED ORDER — TIOTROPIUM BROMIDE MONOHYDRATE 18 MCG IN CAPS: 18.0000 ug | ORAL_CAPSULE | Freq: Every day | RESPIRATORY_TRACT | Status: DC

## 2013-04-06 NOTE — Patient Instructions (Signed)
Zithromax 250 mg pills >> 2 pills on day one, then 1 pill daily for 4 days Call if not feeling better Follow up in 6 months

## 2013-04-06 NOTE — Progress Notes (Signed)
Chief Complaint  Patient presents with  . Acute Visit    Pt c/o cough w/ lots of clear-yellow phlem, wheezing, nasal congestion, facail pressure, HA, slight chest tx. All this has been going on x tuesday. SOB has been okay as long as he does not over do it.     History of Present Illness: [name] is a 62 y.o. male former smoker with GOLD 3 COPD, and hypoxia  He was doing well until about 3 days ago.  His son and daughter were sick, and he thinks he caught what they had.    He is having cough with clear to white sputum.  He has sinus drainage, and pressure feeling over his maxillary and frontal sinus area.  He has felt flush, but is not sure if he has temperature.  He has been feeling more winded.  He denies chest pain or wheeze.  He denies abdominal symptoms.  Tests: PFT 12/24/09>>FEV1 1.60(57%), FEV1% 52, TLC 6.86(130%), DLCO 67%, no BD PSG with 3 liters oxygen 06/12/12>>AHI 1.2, SpO2 low 90%, PLMI 1. Echo 09/27/12>>EF 55 to 60%, mild RA dilation  He  has a past medical history of COPD (chronic obstructive pulmonary disease); Chronic respiratory failure with hypoxia; Anxiety; Dyspnea; Hypothyroidism; Hypertension; Hyperlipidemia; Osteoarthritis; and BPH (benign prostatic hyperplasia).  He  has no past surgical history on file.  Current Outpatient Prescriptions on File Prior to Visit  Medication Sig Dispense Refill  . acetaminophen (TYLENOL) 500 MG tablet Take 500 mg by mouth every 6 (six) hours as needed.        Marland Kitchen albuterol (PROAIR HFA) 108 (90 BASE) MCG/ACT inhaler Inhale 2 puffs into the lungs every 6 (six) hours as needed.  6 Inhaler  1  . albuterol (PROVENTIL) (2.5 MG/3ML) 0.083% nebulizer solution Take 3 mLs (2.5 mg total) by nebulization every 4 (four) hours as needed.  1080 mL  3  . atorvastatin (LIPITOR) 10 MG tablet Take 5 mg by mouth daily.      . budesonide-formoterol (SYMBICORT) 160-4.5 MCG/ACT inhaler Inhale 2 puffs into the lungs 2 (two) times daily.  3 Inhaler  1   . furosemide (LASIX) 40 MG tablet Take 1 tablet (40 mg total) by mouth 2 (two) times daily.  180 tablet  1  . Guaifenesin (MUCINEX MAXIMUM STRENGTH) 1200 MG TB12 Take 1 tablet by mouth daily.       Marland Kitchen HYDROcodone-acetaminophen (NORCO) 5-325 MG per tablet Take 2 tablets by mouth Three times daily as needed.      Marland Kitchen levothyroxine (SYNTHROID, LEVOTHROID) 200 MCG tablet Take 1 tablet (200 mcg total) by mouth daily.  30 tablet  5  . loratadine (CLARITIN) 10 MG tablet Take 10 mg by mouth daily.        Marland Kitchen LORazepam (ATIVAN) 0.5 MG tablet Take 1 tablet (0.5 mg total) by mouth every 8 (eight) hours as needed for anxiety.  270 tablet  2  . methocarbamol (ROBAXIN) 500 MG tablet Take 500 mg by mouth 2 (two) times daily.        . Misc. Devices (ACAPELLA) MISC Dx 496  1 each  0  . montelukast (SINGULAIR) 10 MG tablet Take 1 tablet (10 mg total) by mouth at bedtime.  90 tablet  3  . nabumetone (RELAFEN) 500 MG tablet Take 500 mg by mouth daily.        Marland Kitchen nystatin (MYCOSTATIN) 100000 UNIT/ML suspension Take 500,000 Units by mouth 4 (four) times daily as needed.       Marland Kitchen  potassium chloride SA (K-DUR,KLOR-CON) 20 MEQ tablet Take 1 tablet (20 mEq total) by mouth daily.  30 tablet  5  . ranitidine (ZANTAC) 150 MG tablet Take 150 mg by mouth at bedtime.        . SUCRALFATE PO Take by mouth. 1 tablet  4 times day       . Tamsulosin HCl (FLOMAX) 0.4 MG CAPS Take by mouth.        . tiotropium (SPIRIVA) 18 MCG inhalation capsule Place 1 capsule (18 mcg total) into inhaler and inhale daily.  90 capsule  4  . triamcinolone (NASACORT) 55 MCG/ACT nasal inhaler Place 2 sprays into the nose 2 (two) times daily.  3 Inhaler  3  . valsartan (DIOVAN) 160 MG tablet Take 160 mg by mouth daily.         No current facility-administered medications on file prior to visit.    Allergies  Allergen Reactions  . Penicillins     Physical Exam:  General - No distress, wearing oxygen ENT - Mild maxillary and frontal tenderness b/l,  clear to yellow nasal drainage, no oral exudate, no LAN Cardiac - s1s2 regular, no murmur Chest - Prolonged exhalation, decreased breath sounds, no wheeze/rales Back - No focal tenderness Abd - Soft, non-tender Ext - ankle edema Neuro - Normal strength Skin - No rashes Psych - Normal mood, and behavior   Assessment/Plan:  Coralyn Helling, MD [facility] Pager:  [phone] 04/06/2013, 2:07 PM

## 2013-04-06 NOTE — Assessment & Plan Note (Signed)
Will give him course of zithromax.  He is to continue his sinus regimen including nasacort.

## 2013-04-06 NOTE — Assessment & Plan Note (Signed)
I don't think he needs chest xray or prednisone at present.  Will continue symbicort, spiriva, singulair, and albuterol.

## 2013-04-06 NOTE — Assessment & Plan Note (Signed)
He is to continue nasal irrigation, nasacort, singulair and claritin.

## 2013-04-06 NOTE — Assessment & Plan Note (Signed)
He is to continue 3 liters oxygen 24/7. 

## 2013-04-06 NOTE — Telephone Encounter (Signed)
Called, spoke with pt.  Reports prod cough with milky mucus, chest congestion, a "little" increased SOB "but not much," wheezing, and cold sweats.  Syptoms worsened over the night.  Taking mucinex dm.  OV scheduled with Dr. Craige Cotta for today at 1:45 pm.  Pt aware.

## 2013-04-10 ENCOUNTER — Telehealth: Payer: Self-pay | Admitting: Pulmonary Disease

## 2013-04-10 MED ORDER — PREDNISONE 10 MG PO TABS: ORAL_TABLET | ORAL | Status: DC

## 2013-04-10 NOTE — Telephone Encounter (Signed)
Pt seen in office of 04-06-13 and given Zpak.  Finished this am.  Still c/o chest congestion, prod cough (clear- white).  Denies fever.  SOB is improving.  Pt states that Dr Craige Cotta had mentioned a Pred taper if symptoms were not totally gone.  Please advise.

## 2013-04-10 NOTE — Telephone Encounter (Signed)
Spoke with pt and informed that Pred taper was sent to pharmacy per Dr Craige Cotta

## 2013-04-10 NOTE — Telephone Encounter (Signed)
Please send script for prednisone 10 mg pills >> 3 pills for 2 days, 2 pills for 2 days, 1 pill for 2 days, 1/2 pill for 2 days.  Dispense 13 pills with no refills.

## 2013-04-30 ENCOUNTER — Telehealth: Payer: Self-pay | Admitting: Family Medicine

## 2013-05-01 MED ORDER — FUROSEMIDE 40 MG PO TABS: 40.0000 mg | ORAL_TABLET | Freq: Two times a day (BID) | ORAL | Status: DC

## 2013-05-01 MED ORDER — LEVOTHYROXINE SODIUM 200 MCG PO TABS: 200.0000 ug | ORAL_TABLET | Freq: Every day | ORAL | Status: DC

## 2013-05-01 MED ORDER — MONTELUKAST SODIUM 10 MG PO TABS: 10.0000 mg | ORAL_TABLET | Freq: Every day | ORAL | Status: DC

## 2013-05-01 NOTE — Telephone Encounter (Signed)
Medication refilled per protocol. 

## 2013-05-02 ENCOUNTER — Other Ambulatory Visit: Payer: Self-pay | Admitting: Family Medicine

## 2013-05-02 ENCOUNTER — Other Ambulatory Visit (HOSPITAL_COMMUNITY): Payer: Self-pay | Admitting: Family Medicine

## 2013-05-02 MED ORDER — MONTELUKAST SODIUM 10 MG PO TABS: 10.0000 mg | ORAL_TABLET | Freq: Every day | ORAL | Status: DC

## 2013-05-02 NOTE — Telephone Encounter (Signed)
Med rf °

## 2013-05-16 ENCOUNTER — Telehealth: Payer: Self-pay | Admitting: Pulmonary Disease

## 2013-05-16 MED ORDER — AEROCHAMBER Z-STAT PLUS MISC: Status: DC

## 2013-05-16 NOTE — Telephone Encounter (Signed)
I spoke with pt. He stated he needs a new aero chamber. He is 62 years old per pt. I advised pt cna leave him a new for pick up. Nothing further was needed

## 2013-05-18 ENCOUNTER — Other Ambulatory Visit: Payer: Self-pay | Admitting: Pulmonary Disease

## 2013-05-18 MED ORDER — AEROCHAMBER Z-STAT PLUS MISC: Status: DC

## 2013-05-28 ENCOUNTER — Other Ambulatory Visit: Payer: Self-pay | Admitting: Pulmonary Disease

## 2013-07-26 ENCOUNTER — Telehealth: Payer: Self-pay | Admitting: Family Medicine

## 2013-07-26 MED ORDER — TAMSULOSIN HCL 0.4 MG PO CAPS: 0.4000 mg | ORAL_CAPSULE | Freq: Every day | ORAL | Status: DC

## 2013-07-26 MED ORDER — VALSARTAN 160 MG PO TABS: 160.0000 mg | ORAL_TABLET | Freq: Every day | ORAL | Status: DC

## 2013-07-26 NOTE — Telephone Encounter (Signed)
Tamsulosin 0.4 mg 1 QHS #90 Diovan 160 mg 1 QAM #90

## 2013-07-26 NOTE — Telephone Encounter (Signed)
Rx Refilled  

## 2013-07-30 ENCOUNTER — Other Ambulatory Visit: Payer: Self-pay | Admitting: Family Medicine

## 2013-07-30 ENCOUNTER — Encounter: Payer: Self-pay | Admitting: Family Medicine

## 2013-07-30 NOTE — Telephone Encounter (Signed)
Medication refill for one time only.  Patient needs to be seen.  Letter sent for patient to call and schedule 

## 2013-08-03 ENCOUNTER — Telehealth: Payer: Self-pay | Admitting: Pulmonary Disease

## 2013-08-03 NOTE — Telephone Encounter (Signed)
Please inform pt that forms are completed.  I have placed these in my look at section on Side A.

## 2013-08-03 NOTE — Telephone Encounter (Signed)
I looked in the look at folder on Side A for VS; no papers there and Lillia Abed did not have those papers as well; as we discussed I will forward message to her to look for these papers on Monday.

## 2013-08-03 NOTE — Telephone Encounter (Signed)
I spoke with pt and advised that VS has been out of the office but has returned so I will send him a message and let him know that the pt is needing the paperwork completed asap. [name], CMA

## 2013-08-06 ENCOUNTER — Telehealth: Payer: Self-pay | Admitting: Pulmonary Disease

## 2013-08-06 MED ORDER — ALBUTEROL SULFATE HFA 108 (90 BASE) MCG/ACT IN AERS: 2.0000 | INHALATION_SPRAY | RESPIRATORY_TRACT | Status: DC | PRN

## 2013-08-06 NOTE — Telephone Encounter (Signed)
Spoke with patient, patient is aware Rxs for 2 inhalers per month have been sent in to verified pharmacy Patient verbalized understanding and nothing further needed at this time

## 2013-08-06 NOTE — Telephone Encounter (Signed)
Forms has been filled and signed by VS. This has been sent down to health port. Pt aware

## 2013-08-06 NOTE — Telephone Encounter (Signed)
Could Triage please look in to this for me, as I am in [facility] today. Thanks.

## 2013-08-06 NOTE — Telephone Encounter (Signed)
Okay to send order for 2 proair inhalers per month.

## 2013-08-06 NOTE — Telephone Encounter (Signed)
I spoke with pt. He stated he is suppose to get 2 proair inhalers per month not just one. I looked in past medications and some rx's sent for 6 inhalers at a time, 3 inhalers at a time, and then 1 inhaler at a time. Dr. Craige Cotta please advise if okay to send in proair rx with 2 inhalers thanks

## 2013-08-08 ENCOUNTER — Other Ambulatory Visit (HOSPITAL_COMMUNITY): Payer: Self-pay | Admitting: Family Medicine

## 2013-08-20 ENCOUNTER — Ambulatory Visit: Payer: Self-pay | Admitting: Family Medicine

## 2013-08-21 ENCOUNTER — Encounter: Payer: Self-pay | Admitting: Family Medicine

## 2013-08-21 ENCOUNTER — Ambulatory Visit (INDEPENDENT_AMBULATORY_CARE_PROVIDER_SITE_OTHER): Payer: Medicare Other | Admitting: Family Medicine

## 2013-08-21 VITALS — BP 132/70 | HR 100 | Temp 98.5°F | Resp 24 | Wt 249.0 lb

## 2013-08-21 DIAGNOSIS — R609 Edema, unspecified: Secondary | ICD-10-CM

## 2013-08-21 DIAGNOSIS — E039 Hypothyroidism, unspecified: Secondary | ICD-10-CM

## 2013-08-21 DIAGNOSIS — J449 Chronic obstructive pulmonary disease, unspecified: Secondary | ICD-10-CM

## 2013-08-21 DIAGNOSIS — E785 Hyperlipidemia, unspecified: Secondary | ICD-10-CM

## 2013-08-21 DIAGNOSIS — R600 Localized edema: Secondary | ICD-10-CM

## 2013-08-21 MED ORDER — PREDNISONE 20 MG PO TABS: ORAL_TABLET | ORAL | Status: DC

## 2013-08-21 NOTE — Progress Notes (Signed)
Subjective:    Patient ID: [name], male    DOB: 1951/07/10, 62 y.o.   MRN: [id number]  HPI Patient is here for followup of his multiple medical problems. He currently has hypothyroidism. He is taking levothyroid 200 mcg by mouth daily. He is overdue to recheck a TSH. He also has hyperlipidemia. He is currently taking Lipitor 10 mg by mouth daily. He denies myalgia right upper quadrant pain. He is overdue check a CMP and fasting lipid panel. He also has COPD. He currently reports an increase in coughing over the last week. He reports increased shortness of breath. He reports increasing chest tightness. This is off all her recent upper respiratory infection. He also reports increased peripheral edema. He was supposed to be taking Lasix 40 mg by mouth twice a day. Said he's been taking 80 mg by mouth twice a day. He is also taking Klor-Con 20 mEq tablets by mouth daily.  He denies polyuria polydipsia or leg cramps. He is well overdue for a CMP. Past Medical History  Diagnosis Date  . COPD (chronic obstructive pulmonary disease)   . Chronic respiratory failure with hypoxia   . Anxiety   . Dyspnea   . Hypothyroidism   . Hypertension   . Hyperlipidemia   . Osteoarthritis   . BPH (benign prostatic hyperplasia)    No past surgical history on file. Current Outpatient Prescriptions on File Prior to Visit  Medication Sig Dispense Refill  . acetaminophen (TYLENOL) 500 MG tablet Take 500 mg by mouth every 6 (six) hours as needed.        Marland Kitchen albuterol (PROVENTIL) (2.5 MG/3ML) 0.083% nebulizer solution Take 3 mLs (2.5 mg total) by nebulization every 6 (six) hours as needed for wheezing or shortness of breath.  120 vial  5  . albuterol (VENTOLIN HFA) 108 (90 BASE) MCG/ACT inhaler Inhale 2 puffs into the lungs every 4 (four) hours as needed for wheezing.  2 Inhaler  3  . atorvastatin (LIPITOR) 10 MG tablet Take 5 mg by mouth daily.      . budesonide-formoterol (SYMBICORT) 160-4.5 MCG/ACT inhaler Inhale 2  puffs into the lungs 2 (two) times daily.  1 Inhaler  5  . Guaifenesin (MUCINEX MAXIMUM STRENGTH) 1200 MG TB12 Take 1 tablet by mouth daily.       Marland Kitchen HYDROcodone-acetaminophen (NORCO) 5-325 MG per tablet Take 2 tablets by mouth Three times daily as needed.      Marland Kitchen levothyroxine (SYNTHROID, LEVOTHROID) 200 MCG tablet TAKE 1 TABLET (200 MCG TOTAL) BY MOUTH DAILY.  30 tablet  0  . loratadine (CLARITIN) 10 MG tablet Take 10 mg by mouth daily.        Marland Kitchen LORazepam (ATIVAN) 0.5 MG tablet Take 1 tablet (0.5 mg total) by mouth every 8 (eight) hours as needed for anxiety.  90 tablet  2  . Misc. Devices (ACAPELLA) MISC Dx 496  1 each  0  . montelukast (SINGULAIR) 10 MG tablet Take 1 tablet (10 mg total) by mouth at bedtime.  90 tablet  3  . nystatin (MYCOSTATIN) 100000 UNIT/ML suspension Take 500,000 Units by mouth 4 (four) times daily as needed.       . potassium chloride SA (K-DUR,KLOR-CON) 20 MEQ tablet Take 1 tablet (20 mEq total) by mouth daily.  30 tablet  5  . ranitidine (ZANTAC) 150 MG tablet Take 150 mg by mouth at bedtime.        Marland Kitchen Spacer/Aero-Holding Chambers (AEROCHAMBER Z-STAT PLUS) inhaler Use as instructed  1 each  0  . sucralfate (CARAFATE) 1 G tablet TAKE 1 TABLET FOUR TIMES A DAY  360 tablet  0  . tamsulosin (FLOMAX) 0.4 MG CAPS capsule Take 1 capsule (0.4 mg total) by mouth daily.  90 capsule  1  . tiotropium (SPIRIVA) 18 MCG inhalation capsule Place 1 capsule (18 mcg total) into inhaler and inhale daily.  30 capsule  5  . triamcinolone (NASACORT) 55 MCG/ACT nasal inhaler Place 2 sprays into the nose 2 (two) times daily.  1 Inhaler  5  . valsartan (DIOVAN) 160 MG tablet Take 1 tablet (160 mg total) by mouth daily.  90 tablet  1   No current facility-administered medications on file prior to visit.   Allergies  Allergen Reactions  . Penicillins    History   Social History  . Marital Status: Married    Spouse Name: N/A    Number of Children: N/A  . Years of Education: N/A    Occupational History  . Not on file.   Social History Main Topics  . Smoking status: Former Smoker -- 40 years    Types: Cigarettes    Quit date: 12/13/2010  . Smokeless tobacco: Never Used     Comment: 3-4 cigs a day  . Alcohol Use: Not on file  . Drug Use: Not on file  . Sexual Activity: Not on file   Other Topics Concern  . Not on file   Social History Narrative  . No narrative on file      Review of Systems  All other systems reviewed and are negative.       Objective:   Physical Exam  Vitals reviewed. Constitutional: He appears well-developed and well-nourished.  HENT:  Mouth/Throat: Oropharynx is clear and moist.  Eyes: Pupils are equal, round, and reactive to light. Right eye exhibits no discharge. Left eye exhibits no discharge. No scleral icterus.  Neck: Normal range of motion. Neck supple. No JVD present. No thyromegaly present.  Cardiovascular: Normal rate, regular rhythm and normal heart sounds.   No murmur heard. Pulmonary/Chest: Effort normal. He has decreased breath sounds. He has wheezes. He has no rhonchi. He has no rales.  Abdominal: Soft. Bowel sounds are normal. He exhibits no distension. There is no tenderness. There is no rebound.  Musculoskeletal: He exhibits edema (patient has +1 edema distal to his shins bilaterally).  Lymphadenopathy:    He has no cervical adenopathy.   3 mm ulcer on his lower inner left jaw suspicious for malignancy.        Assessment & Plan:  1. COPD (chronic obstructive pulmonary disease) Patient appears to be developing COPD exacerbation. I will start him on prednisone Dosepak. He is to take his rescue inhaler albuterol 2 puffs inhaled Q6 hours when necessary. Recheck in 48 hours if no better or sooner if worse. - CBC with Differential - predniSONE (DELTASONE) 20 MG tablet; 3 tabs poqday 1-2, 2 tabs poqday 3-4, 1 poqday 5-6  Dispense: 12 tablet; Refill: 0  2. Peripheral edema Check a CMP. If his renal function  and potassium are stable he can continue Lasix 80 mg by mouth twice a day - COMPLETE METABOLIC PANEL WITH GFR  3. Unspecified hypothyroidism Check TSH. - TSH  4. HLD (hyperlipidemia) Check fasting lipid panel. Goal LDL is less than 100 and - Lipid panel I am concerned about a lesion in his mouth. I recommended he make a appointment with his dentist to discuss biopsying it to rule out malignancy  of his long-standing tobacco abuse history.

## 2013-08-22 ENCOUNTER — Other Ambulatory Visit: Payer: Self-pay | Admitting: Family Medicine

## 2013-08-22 LAB — COMPLETE METABOLIC PANEL WITH GFR
Albumin: 3.9 g/dL (ref 3.5–5.2)
Alkaline Phosphatase: 94 U/L (ref 39–117)
BUN: 16 mg/dL (ref 6–23)
GFR, Est Non African American: >89 mL/min
Glucose, Bld: 96 mg/dL (ref 70–99)
Potassium: 4.3 mEq/L (ref 3.5–5.3)
Total Bilirubin: 0.3 mg/dL (ref 0.3–1.2)

## 2013-08-22 LAB — TSH: TSH: 1.048 u[IU]/mL (ref 0.350–4.500)

## 2013-08-22 LAB — CBC WITH DIFFERENTIAL/PLATELET
Basophils Absolute: 0.1 10*3/uL (ref 0.0–0.1)
HCT: 38.9 % — ABNORMAL LOW (ref 39.0–52.0)
Lymphocytes Relative: 24 % (ref 12–46)
Monocytes Absolute: 0.6 10*3/uL (ref 0.1–1.0)
Neutro Abs: 5.1 10*3/uL (ref 1.7–7.7)
RDW: 14.2 % (ref 11.5–15.5)
WBC: 7.9 10*3/uL (ref 4.0–10.5)

## 2013-08-22 LAB — LIPID PANEL
LDL Cholesterol: 47 mg/dL (ref 0–99)
Triglycerides: 173 mg/dL — ABNORMAL HIGH (ref <150)
VLDL: 35 mg/dL (ref 0–40)

## 2013-08-23 ENCOUNTER — Telehealth: Payer: Self-pay | Admitting: Family Medicine

## 2013-08-23 MED ORDER — POTASSIUM CHLORIDE CRYS ER 20 MEQ PO TBCR: 20.0000 meq | EXTENDED_RELEASE_TABLET | Freq: Every day | ORAL | Status: DC

## 2013-08-23 MED ORDER — FUROSEMIDE 40 MG PO TABS: 80.0000 mg | ORAL_TABLET | Freq: Two times a day (BID) | ORAL | Status: DC

## 2013-08-23 NOTE — Telephone Encounter (Signed)
Pt called with lab results and refills of lasix and potassium sent to pharmacy

## 2013-08-23 NOTE — Telephone Encounter (Signed)
Message copied by Donne Anon on Thu Aug 23, 2013  4:28 PM ------      Message from: Lynnea Ferrier      Created: Thu Aug 23, 2013  7:47 AM       Labs are ok.  Continue lasix 80 bid and potassium. ------

## 2013-08-27 ENCOUNTER — Telehealth: Payer: Self-pay | Admitting: Family Medicine

## 2013-08-27 MED ORDER — FUROSEMIDE 40 MG PO TABS: 80.0000 mg | ORAL_TABLET | Freq: Two times a day (BID) | ORAL | Status: DC

## 2013-08-27 NOTE — Telephone Encounter (Signed)
Furosemide 40 mg tab 2 BID #120 (it looks like when it was e-scribed on 08/23/13 we only did it for #60 which is only for 15 days)

## 2013-08-27 NOTE — Telephone Encounter (Signed)
Rx Refilled  

## 2013-08-28 ENCOUNTER — Other Ambulatory Visit: Payer: Self-pay | Admitting: Family Medicine

## 2013-08-28 NOTE — Telephone Encounter (Signed)
Medication refilled per protocol. 

## 2013-10-02 ENCOUNTER — Telehealth: Payer: Self-pay | Admitting: Pulmonary Disease

## 2013-10-02 NOTE — Telephone Encounter (Signed)
Spoke with the pt and notified of recs per VS He verbalized understanding  States that he is already taking proair prn and has got the albuterol nebs for not too much  He will fill out the forms for pt assistance and go from there  Nothing further needed per pt

## 2013-10-02 NOTE — Telephone Encounter (Signed)
Called and spoke with pt and he stated that now he is on medicare he is in the donut hole.  He stated that his valsartan will cost him about $300 out of pocket.  i gave him a paper for [facility] drug to call and see how much he could get it for there.    Pt also stated that the symbicort and the spiriva and the proair and the nebulizer medications are going to be very expensive until the first of the year.  i have left a sample up front for the pt of the spiriva and the symbicort and i have left pt assistance forms to be filled out by the pt and he is aware that he will need to bring these back to [place].  Pt is aware that i will forward to VS of recs about the albuterol for the nebulizer.  VS please advise. Thanks  Allergies  Allergen Reactions  . Penicillins

## 2013-10-02 NOTE — Telephone Encounter (Signed)
Please inform pt that he could use his proair in place of albuterol nebulizer medication.  Unfortunately there are no less expensive alternatives for his inhaler and nebulizer medication.  He could try shopping around to different pharmacies to determine if he can get better pricing for his medications.

## 2013-10-03 ENCOUNTER — Telehealth: Payer: Self-pay | Admitting: Family Medicine

## 2013-10-03 NOTE — Telephone Encounter (Signed)
Patient is concerned with the cost of his BP meds . Please call him back and see if there is something else that is not as costly . Please call and advise.

## 2013-10-04 NOTE — Telephone Encounter (Signed)
I reviewed med list.  No BP med is expensive.  Cost is likely from spiriva and symbicort.  There are no cheap alternatives to these.  What BP med is he referring to?

## 2013-10-04 NOTE — Telephone Encounter (Signed)
Left message with pt to return my call °

## 2013-10-12 NOTE — Telephone Encounter (Signed)
LMTRC

## 2013-10-17 ENCOUNTER — Ambulatory Visit (INDEPENDENT_AMBULATORY_CARE_PROVIDER_SITE_OTHER): Payer: Medicare Other | Admitting: Adult Health

## 2013-10-17 ENCOUNTER — Encounter: Payer: Self-pay | Admitting: Adult Health

## 2013-10-17 VITALS — BP 126/68 | HR 90 | Temp 98.2°F | Ht 64.0 in | Wt 250.2 lb

## 2013-10-17 DIAGNOSIS — J9611 Chronic respiratory failure with hypoxia: Secondary | ICD-10-CM

## 2013-10-17 DIAGNOSIS — Z23 Encounter for immunization: Secondary | ICD-10-CM

## 2013-10-17 DIAGNOSIS — R0902 Hypoxemia: Secondary | ICD-10-CM

## 2013-10-17 DIAGNOSIS — J9610 Chronic respiratory failure, unspecified whether with hypoxia or hypercapnia: Secondary | ICD-10-CM

## 2013-10-17 DIAGNOSIS — J449 Chronic obstructive pulmonary disease, unspecified: Secondary | ICD-10-CM

## 2013-10-17 NOTE — Patient Instructions (Signed)
Continue on current regimen  Flu shot today  Follow up Dr. Craige Cotta  In 6 months and As needed

## 2013-10-17 NOTE — Assessment & Plan Note (Signed)
Compensated on present regimen   Plan  Flu shot today  Cont on current regimen  follow up Dr. Craige Cotta  In 6 months and As needed

## 2013-10-17 NOTE — Progress Notes (Signed)
Reviewed and agree with assessment/plan. 

## 2013-10-17 NOTE — Progress Notes (Signed)
  Subjective:    Patient ID: [name], male    DOB: [birth year], 62 y.o.   MRN: [id number]  HPI 62 yo former smoker with GOLD 3 COPD, and hypoxia  Tests: PFT 12/24/09>>FEV1 1.60(57%), FEV1% 52, TLC 6.86(130%), DLCO 67%, no BD PSG with 3 liters oxygen 06/12/12>>AHI 1.2, SpO2 low 90%, PLMI 1. Echo 09/27/12>>EF 55 to 60%, mild RA dilation  10/17/2013 Follow up COPD  6 month follow up COPD.  Reports breathing is doing since last ov.  Remains on Symbicort and Spiriva.  No ER or hospital visits.  No increased SABA use.  No change in activity level.  Taking clindamycin 300mg  q6h x10days for dental surgery yesterday.  Had oral surgery yesterday.  No new complaints.  CAT = 29  Review of Systems Constitutional:   No  weight loss, night sweats,  Fevers, chills, + fatigue  HEENT:   No headaches,  Difficulty swallowing,  Tooth/dental problems, or  Sore throat,                No sneezing, itching, ear ache, nasal congestion, post nasal drip,   CV:  No chest pain,  Orthopnea, PND, swelling in lower extremities, anasarca, dizziness, palpitations, syncope.   GI  No heartburn, indigestion, abdominal pain, nausea, vomiting, diarrhea, change in bowel habits, loss of appetite, bloody stools.   Resp:   No wheezing.  No chest wall deformity  Skin: no rash or lesions.  GU: no dysuria, change in color of urine, no urgency or frequency.  No flank pain, no hematuria   MS:  No joint pain or swelling.  No decreased range of motion.  No back pain.  Psych:  No change in mood or affect. No depression or anxiety.  No memory loss.          Objective:   Physical Exam GEN: A/Ox3; pleasant , NAD, elderly on O2   HEENT:  [place]/AT,  EACs-clear, TMs-wnl, NOSE-clear, THROAT-clear, no lesions, no postnasal drip or exudate noted.   NECK:  Supple w/ fair ROM; no JVD; normal carotid impulses w/o bruits; no thyromegaly or nodules palpated; no lymphadenopathy.  RESP  Diminshed BS in bases , no accessory muscle  use, no dullness to percussion  CARD:  RRR, no m/r/g  , tr peripheral edema, pulses intact, no cyanosis or clubbing.  GI:   Soft & nt; nml bowel sounds; no organomegaly or masses detected.  Musco: Warm bil, no deformities or joint swelling noted.   Neuro: alert, no focal deficits noted.    Skin: Warm, no lesions or rashes         Assessment & Plan:

## 2013-10-17 NOTE — Assessment & Plan Note (Signed)
Cont on O2 .  

## 2013-10-30 ENCOUNTER — Other Ambulatory Visit: Payer: Self-pay | Admitting: Pulmonary Disease

## 2013-11-28 ENCOUNTER — Other Ambulatory Visit: Payer: Self-pay | Admitting: Family Medicine

## 2013-12-21 ENCOUNTER — Other Ambulatory Visit: Payer: Self-pay | Admitting: Pulmonary Disease

## 2013-12-27 ENCOUNTER — Other Ambulatory Visit: Payer: Self-pay | Admitting: Family Medicine

## 2013-12-27 NOTE — Telephone Encounter (Signed)
Medication refilled per protocol. 

## 2014-01-24 ENCOUNTER — Other Ambulatory Visit: Payer: Self-pay | Admitting: Pulmonary Disease

## 2014-01-24 ENCOUNTER — Telehealth: Payer: Self-pay | Admitting: Pulmonary Disease

## 2014-01-24 ENCOUNTER — Encounter: Payer: Self-pay | Admitting: Family Medicine

## 2014-01-24 ENCOUNTER — Other Ambulatory Visit: Payer: Self-pay | Admitting: Family Medicine

## 2014-01-24 NOTE — Telephone Encounter (Signed)
Rx has been sent in. Pt is aware. 

## 2014-01-24 NOTE — Telephone Encounter (Signed)
Medication refill for one time only.  Patient needs to be seen.  Letter sent for patient to call and schedule 

## 2014-01-28 ENCOUNTER — Other Ambulatory Visit: Payer: Self-pay | Admitting: Pulmonary Disease

## 2014-01-28 ENCOUNTER — Other Ambulatory Visit: Payer: Self-pay | Admitting: Family Medicine

## 2014-01-28 NOTE — Telephone Encounter (Signed)
Medication refilled per protocol. 

## 2014-02-06 ENCOUNTER — Telehealth: Payer: Self-pay | Admitting: Pulmonary Disease

## 2014-02-06 MED ORDER — AZITHROMYCIN 250 MG PO TABS: ORAL_TABLET | ORAL | Status: DC

## 2014-02-06 NOTE — Telephone Encounter (Signed)
Okay to send script for Zpak.  Please advise pt to call back to schedule ROV if he is not improved.

## 2014-02-06 NOTE — Telephone Encounter (Signed)
I called and spoke with pt. He is requesting a ZPAK. He c/o chest congestion, prod cough w/ white phlem x 3 days. No wheezing, no chest tx, no fever, O2 sats are in the 90's. Please advise Dr. Craige CottaSood thanks  Allergies  Allergen Reactions  . Penicillins

## 2014-02-06 NOTE — Telephone Encounter (Signed)
Called, spoke with pt.  He is aware zpak rx was sent to [facility] on [address].  He is in agreement to call back for OV if symptoms do not improve.  He verbalized understanding of instructions and voiced no further questions or concerns at this time.

## 2014-02-19 ENCOUNTER — Other Ambulatory Visit: Payer: Self-pay | Admitting: Family Medicine

## 2014-02-19 ENCOUNTER — Telehealth: Payer: Self-pay | Admitting: Pulmonary Disease

## 2014-02-19 ENCOUNTER — Encounter: Payer: Medicare Other | Admitting: Family Medicine

## 2014-02-19 DIAGNOSIS — Z1211 Encounter for screening for malignant neoplasm of colon: Secondary | ICD-10-CM

## 2014-02-19 NOTE — Telephone Encounter (Signed)
Spoke with patient-- Pt states that he received a phone call in regards to scheduling a colonoscopy. Advise the patient that per the referral in his chart, he needed to contact his PCP who placed the order.   Nothing further needed.

## 2014-02-22 ENCOUNTER — Other Ambulatory Visit: Payer: Medicare Other

## 2014-02-22 DIAGNOSIS — Z125 Encounter for screening for malignant neoplasm of prostate: Secondary | ICD-10-CM

## 2014-02-22 DIAGNOSIS — I10 Essential (primary) hypertension: Secondary | ICD-10-CM

## 2014-02-22 DIAGNOSIS — E039 Hypothyroidism, unspecified: Secondary | ICD-10-CM

## 2014-02-22 DIAGNOSIS — E785 Hyperlipidemia, unspecified: Secondary | ICD-10-CM

## 2014-02-22 DIAGNOSIS — Z8639 Personal history of other endocrine, nutritional and metabolic disease: Secondary | ICD-10-CM

## 2014-02-22 DIAGNOSIS — Z0000 Encounter for general adult medical examination without abnormal findings: Secondary | ICD-10-CM

## 2014-02-22 DIAGNOSIS — Z79899 Other long term (current) drug therapy: Secondary | ICD-10-CM

## 2014-02-22 LAB — COMPLETE METABOLIC PANEL WITH GFR
ALK PHOS: 91 U/L (ref 39–117)
ALT: 34 U/L (ref 0–53)
AST: 20 U/L (ref 0–37)
Albumin: 4 g/dL (ref 3.5–5.2)
BUN: 13 mg/dL (ref 6–23)
CALCIUM: 9.5 mg/dL (ref 8.4–10.5)
CHLORIDE: 97 meq/L (ref 96–112)
CO2: 33 mEq/L — ABNORMAL HIGH (ref 19–32)
Creat: 0.71 mg/dL (ref 0.50–1.35)
GFR, Est African American: >89 mL/min
GFR, Est Non African American: >89 mL/min
Glucose, Bld: 88 mg/dL (ref 70–99)
Potassium: 4.1 mEq/L (ref 3.5–5.3)
Sodium: 140 mEq/L (ref 135–145)
Total Bilirubin: 0.3 mg/dL (ref 0.2–1.2)
Total Protein: 6.7 g/dL (ref 6.0–8.3)

## 2014-02-22 LAB — CBC WITH DIFFERENTIAL/PLATELET
BASOS ABS: 0 10*3/uL (ref 0.0–0.1)
BASOS PCT: 0 % (ref 0–1)
Eosinophils Absolute: 0.1 10*3/uL (ref 0.0–0.7)
Eosinophils Relative: 2 % (ref 0–5)
HEMATOCRIT: 40.1 % (ref 39.0–52.0)
Hemoglobin: 13.7 g/dL (ref 13.0–17.0)
LYMPHS PCT: 24 % (ref 12–46)
Lymphs Abs: 1.8 10*3/uL (ref 0.7–4.0)
MCH: 30.9 pg (ref 26.0–34.0)
MCHC: 34.2 g/dL (ref 30.0–36.0)
MCV: 90.3 fL (ref 78.0–100.0)
Monocytes Absolute: 0.5 10*3/uL (ref 0.1–1.0)
Monocytes Relative: 7 % (ref 3–12)
NEUTROS PCT: 67 % (ref 43–77)
Neutro Abs: 5 10*3/uL (ref 1.7–7.7)
PLATELETS: 210 10*3/uL (ref 150–400)
RBC: 4.44 MIL/uL (ref 4.22–5.81)
RDW: 14.4 % (ref 11.5–15.5)
WBC: 7.4 10*3/uL (ref 4.0–10.5)

## 2014-02-22 LAB — LIPID PANEL
Cholesterol: 123 mg/dL (ref 0–200)
HDL: 37 mg/dL — ABNORMAL LOW (ref >39)
LDL Cholesterol: 50 mg/dL (ref 0–99)
Total CHOL/HDL Ratio: 3.3 Ratio
Triglycerides: 178 mg/dL — ABNORMAL HIGH (ref <150)
VLDL: 36 mg/dL (ref 0–40)

## 2014-02-22 LAB — HEMOGLOBIN A1C
HEMOGLOBIN A1C: 5.9 % — AB (ref <5.7)
MEAN PLASMA GLUCOSE: 123 mg/dL — AB (ref <117)

## 2014-02-22 LAB — PSA: PSA: 0.56 ng/mL (ref <=4.00)

## 2014-02-22 LAB — TSH: TSH: 3.158 u[IU]/mL (ref 0.350–4.500)

## 2014-02-23 LAB — VITAMIN D 25 HYDROXY (VIT D DEFICIENCY, FRACTURES): VIT D 25 HYDROXY: 16 ng/mL — AB (ref 30–89)

## 2014-02-26 ENCOUNTER — Encounter: Payer: Self-pay | Admitting: Family Medicine

## 2014-02-26 ENCOUNTER — Ambulatory Visit (INDEPENDENT_AMBULATORY_CARE_PROVIDER_SITE_OTHER): Payer: Medicare Other | Admitting: Family Medicine

## 2014-02-26 VITALS — BP 100/58 | HR 98 | Temp 97.8°F | Resp 24 | Ht 63.0 in | Wt 247.0 lb

## 2014-02-26 DIAGNOSIS — Z0000 Encounter for general adult medical examination without abnormal findings: Secondary | ICD-10-CM

## 2014-02-26 DIAGNOSIS — M79662 Pain in left lower leg: Secondary | ICD-10-CM

## 2014-02-26 DIAGNOSIS — M79609 Pain in unspecified limb: Secondary | ICD-10-CM

## 2014-02-26 DIAGNOSIS — M79661 Pain in right lower leg: Secondary | ICD-10-CM

## 2014-02-26 MED ORDER — SUCRALFATE 1 G PO TABS: ORAL_TABLET | ORAL | Status: DC

## 2014-02-26 MED ORDER — LEVOTHYROXINE SODIUM 200 MCG PO TABS: ORAL_TABLET | ORAL | Status: DC

## 2014-02-26 MED ORDER — POTASSIUM CHLORIDE ER 20 MEQ PO TBCR: EXTENDED_RELEASE_TABLET | ORAL | Status: DC

## 2014-02-26 MED ORDER — ATORVASTATIN CALCIUM 10 MG PO TABS: 5.0000 mg | ORAL_TABLET | Freq: Every day | ORAL | Status: DC

## 2014-02-26 MED ORDER — TAMSULOSIN HCL 0.4 MG PO CAPS: ORAL_CAPSULE | ORAL | Status: DC

## 2014-02-26 MED ORDER — CLOTRIMAZOLE-BETAMETHASONE 1-0.05 % EX CREA: TOPICAL_CREAM | CUTANEOUS | Status: DC

## 2014-02-26 MED ORDER — FUROSEMIDE 40 MG PO TABS: ORAL_TABLET | ORAL | Status: DC

## 2014-02-26 MED ORDER — VALSARTAN 160 MG PO TABS: ORAL_TABLET | ORAL | Status: DC

## 2014-02-26 NOTE — Progress Notes (Signed)
Subjective:    Patient ID: [name], male    DOB: 1951/04/18, 63 y.o.   MRN: [id number]  HPI Patient is a 63 year old white male who has history of severe COPD and is oxygen dependent. His last colonoscopy was greater than 10 years ago. However he is very hesitant to proceed with colonoscopy to his breathing difficulties. He is very afraid of conscious sedation. He denies any melena or hematochezia. He has had Pneumovax 23. He is not due yet for a booster.  However he is due for Prevnar 13. He has recently had an upper respiratory illness and therefore he hasn't received any vaccinations today. He is due for a prostate screening. Otherwise he is in for his complete physical exam. His primary concerns are cramping in his feet which are primarily worse at night. He also complains of severe aching pain in his left anterior shin.  It is better when he walks. It is worse when he sits down and rest. He has no edema in the lower leg. The circumference of his calf is 40 cm on both sides checked 13 cm below the patella. Past Medical History  Diagnosis Date  . COPD (chronic obstructive pulmonary disease)   . Chronic respiratory failure with hypoxia   . Anxiety   . Dyspnea   . Hypothyroidism   . Hypertension   . Hyperlipidemia   . Osteoarthritis   . BPH (benign prostatic hyperplasia)    Current Outpatient Prescriptions on File Prior to Visit  Medication Sig Dispense Refill  . albuterol (PROAIR HFA) 108 (90 BASE) MCG/ACT inhaler Inhale 2 puffs into the lungs every 4 (four) hours as needed for wheezing or shortness of breath.      Marland Kitchen albuterol (PROVENTIL) (2.5 MG/3ML) 0.083% nebulizer solution USE 1 VIAL IN NEBULIZER EVERY 6 HOURS AS NEEDED FOR WHEEZING/SHORTNESS OF BREATH  375 mL  5  . Guaifenesin (MUCINEX MAXIMUM STRENGTH) 1200 MG TB12 Take 1 tablet by mouth daily.       Marland Kitchen HYDROcodone-acetaminophen (NORCO) 5-325 MG per tablet Take 2 tablets by mouth Three times daily as needed.      . loratadine  (CLARITIN) 10 MG tablet Take 10 mg by mouth daily.        Marland Kitchen LORazepam (ATIVAN) 0.5 MG tablet Take 1 tablet (0.5 mg total) by mouth every 8 (eight) hours as needed for anxiety.  90 tablet  2  . Misc. Devices (ACAPELLA) MISC Dx 496  1 each  0  . montelukast (SINGULAIR) 10 MG tablet Take 1 tablet (10 mg total) by mouth at bedtime.  90 tablet  3  . ranitidine (ZANTAC) 150 MG tablet Take 150 mg by mouth at bedtime.        Marland Kitchen SPIRIVA HANDIHALER 18 MCG inhalation capsule PLACE 1 CAPSULE INTO INHALER AND INHALE ONCE DAILY  30 capsule  5  . SYMBICORT 160-4.5 MCG/ACT inhaler INHALE 2 PUFFS INTO LUNGS TWICE DAILY  10.2 g  1  . tiZANidine (ZANAFLEX) 4 MG capsule Take 4 mg by mouth 2 (two) times daily.      Marland Kitchen triamcinolone (NASACORT) 55 MCG/ACT nasal inhaler Place 2 sprays into the nose 2 (two) times daily.  1 Inhaler  5  . nystatin (MYCOSTATIN) 100000 UNIT/ML suspension Take 500,000 Units by mouth 4 (four) times daily as needed.       Marland Kitchen Spacer/Aero-Holding Chambers (AEROCHAMBER Z-STAT PLUS) inhaler Use as instructed  1 each  0  . [DISCONTINUED] potassium chloride SA (K-DUR,KLOR-CON) 20 MEQ tablet TAKE  ONE TABLET BY MOUTH DAILY  30 tablet  0   No current facility-administered medications on file prior to visit.   Allergies  Allergen Reactions  . Penicillins    History   Social History  . Marital Status: Married    Spouse Name: N/A    Number of Children: N/A  . Years of Education: N/A   Occupational History  . Not on file.   Social History Main Topics  . Smoking status: Former Smoker -- 40 years    Types: Cigarettes    Quit date: 12/13/2010  . Smokeless tobacco: Never Used     Comment: 3-4 cigs a day  . Alcohol Use: Not on file  . Drug Use: Not on file  . Sexual Activity: Not on file   Other Topics Concern  . Not on file   Social History Narrative  . No narrative on file   No family history on file.    Review of Systems  All other systems reviewed and are negative.         Objective:   Physical Exam  Vitals reviewed. Constitutional: He is oriented to person, place, and time. He appears well-developed and well-nourished. No distress.  HENT:  Head: Normocephalic and atraumatic.  Right Ear: External ear normal.  Left Ear: External ear normal.  Nose: Nose normal.  Mouth/Throat: Oropharynx is clear and moist. No oropharyngeal exudate.  Eyes: Conjunctivae and EOM are normal. Pupils are equal, round, and reactive to light. Right eye exhibits no discharge. Left eye exhibits no discharge. No scleral icterus.  Neck: Normal range of motion. Neck supple. No JVD present. No tracheal deviation present. No thyromegaly present.  Cardiovascular: Normal rate, regular rhythm, normal heart sounds and intact distal pulses.  Exam reveals no gallop and no friction rub.   No murmur heard. Pulmonary/Chest: Effort normal. No stridor. He has decreased breath sounds. He has wheezes. He has no rhonchi. He has no rales.  Abdominal: Soft. Bowel sounds are normal. He exhibits no distension and no mass. There is no tenderness. There is no rebound and no guarding.  Musculoskeletal: Normal range of motion. He exhibits no edema.  Lymphadenopathy:    He has no cervical adenopathy.  Neurological: He is alert and oriented to person, place, and time. He has normal reflexes. He displays normal reflexes. No cranial nerve deficit. He exhibits normal muscle tone. Coordination normal.  Skin: Skin is warm. No rash noted. He is not diaphoretic. No erythema. No pallor.          Assessment & Plan:    1. Routine general medical examination at a health care facility Patient's most recent labwork as listed below: Lab on 02/22/2014  Component Date Value Ref Range Status  . WBC 02/22/2014 7.4  4.0 - 10.5 K/uL Final  . RBC 02/22/2014 4.44  4.22 - 5.81 MIL/uL Final  . Hemoglobin 02/22/2014 13.7  13.0 - 17.0 g/dL Final  . HCT 02/22/2014 40.1  39.0 - 52.0 % Final  . MCV 02/22/2014 90.3  78.0 - 100.0 fL  Final  . MCH 02/22/2014 30.9  26.0 - 34.0 pg Final  . MCHC 02/22/2014 34.2  30.0 - 36.0 g/dL Final  . RDW 02/22/2014 14.4  11.5 - 15.5 % Final  . Platelets 02/22/2014 210  150 - 400 K/uL Final  . Neutrophils Relative % 02/22/2014 67  43 - 77 % Final  . Neutro Abs 02/22/2014 5.0  1.7 - 7.7 K/uL Final  . Lymphocytes Relative 02/22/2014 24  12 - 46 % Final  . Lymphs Abs 02/22/2014 1.8  0.7 - 4.0 K/uL Final  . Monocytes Relative 02/22/2014 7  3 - 12 % Final  . Monocytes Absolute 02/22/2014 0.5  0.1 - 1.0 K/uL Final  . Eosinophils Relative 02/22/2014 2  0 - 5 % Final  . Eosinophils Absolute 02/22/2014 0.1  0.0 - 0.7 K/uL Final  . Basophils Relative 02/22/2014 0  0 - 1 % Final  . Basophils Absolute 02/22/2014 0.0  0.0 - 0.1 K/uL Final  . Smear Review 02/22/2014 Criteria for review not met   Final  . Hemoglobin A1C 02/22/2014 5.9* <5.7 % Final   Comment:                                                                                                 According to the ADA Clinical Practice Recommendations for 2011, when                          HbA1c is used as a screening test:                                                       >=6.5%   Diagnostic of Diabetes Mellitus                                     (if abnormal result is confirmed)                                                     5.7-6.4%   Increased risk of developing Diabetes Mellitus                                                     References:Diagnosis and Classification of Diabetes Mellitus,Diabetes                          EPPI,9518,84(ZYSAY 1):S62-S69 and Standards of Medical Care in                                  Diabetes - 2011,Diabetes Care,2011,34 (Suppl 1):S11-S61.                             . Mean Plasma Glucose 02/22/2014 123* <117 mg/dL Final  . Cholesterol 02/22/2014 123  0 - 200 mg/dL Final   Comment: ATP III Classification:                                <  200        mg/dL        Desirable                                200 - 239     mg/dL        Borderline High                               >= 240        mg/dL        High                             . Triglycerides 02/22/2014 178* <150 mg/dL Final  . HDL 02/22/2014 37* >39 mg/dL Final  . Total CHOL/HDL Ratio 02/22/2014 3.3   Final  . VLDL 02/22/2014 36  0 - 40 mg/dL Final  . LDL Cholesterol 02/22/2014 50  0 - 99 mg/dL Final   Comment:                            Total Cholesterol/HDL Ratio:CHD Risk                                                 Coronary Heart Disease Risk Table                                                                 Men       Women                                   1/2 Average Risk              3.4        3.3                                       Average Risk              5.0        4.4                                    2X Average Risk              9.6        7.1                                    3X Average Risk             23.4       11.0  Use the calculated Patient Ratio above and the CHD Risk table                           to determine the patient's CHD Risk.                          ATP III Classification (LDL):                                < 100        mg/dL         Optimal                               100 - 129     mg/dL         Near or Above Optimal                               130 - 159     mg/dL         Borderline High                               160 - 189     mg/dL         High                                > 190        mg/dL         Very High                             . PSA 02/22/2014 0.56  <=4.00 ng/mL Final   Comment: Test Methodology: ECLIA PSA (Electrochemiluminescence Immunoassay)                                                     For PSA values from 2.5-4.0, particularly in younger men <60 years                          old, the AUA and NCCN suggest testing for % Free PSA (3515) and                          evaluation of the rate of increase in PSA (PSA  velocity).  . TSH 02/22/2014 3.158  0.350 - 4.500 uIU/mL Final  . Vit D, 25-Hydroxy 02/22/2014 16* 30 - 89 ng/mL Final   Comment: This assay accurately quantifies Vitamin D, which is the sum of the                          25-Hydroxy forms of Vitamin D2 and D3.  Studies have shown that the  optimum concentration of 25-Hydroxy Vitamin D is 30 ng/mL or higher.                           Concentrations of Vitamin D between 20 and 29 ng/mL are considered to                          be insufficient and concentrations less than 20 ng/mL are considered                          to be deficient for Vitamin D.  . Sodium 02/22/2014 140  135 - 145 mEq/L Final  . Potassium 02/22/2014 4.1  3.5 - 5.3 mEq/L Final  . Chloride 02/22/2014 97  96 - 112 mEq/L Final  . CO2 02/22/2014 33* 19 - 32 mEq/L Final  . Glucose, Bld 02/22/2014 88  70 - 99 mg/dL Final  . BUN 02/22/2014 13  6 - 23 mg/dL Final  . Creat 02/22/2014 0.71  0.50 - 1.35 mg/dL Final  . Total Bilirubin 02/22/2014 0.3  0.2 - 1.2 mg/dL Final  . Alkaline Phosphatase 02/22/2014 91  39 - 117 U/L Final  . AST 02/22/2014 20  0 - 37 U/L Final  . ALT 02/22/2014 34  0 - 53 U/L Final  . Total Protein 02/22/2014 6.7  6.0 - 8.3 g/dL Final  . Albumin 02/22/2014 4.0  3.5 - 5.2 g/dL Final  . Calcium 02/22/2014 9.5  8.4 - 10.5 mg/dL Final  . GFR, Est African American 02/22/2014 >89   Final  . GFR, Est Non African American 02/22/2014 >89   Final   Comment:                            The estimated GFR is a calculation valid for adults (>=59 years old)                          that uses the CKD-EPI algorithm to adjust for age and sex. It is                            not to be used for children, pregnant women, hospitalized patients,                             patients on dialysis, or with rapidly changing kidney function.                          According to the NKDEP, eGFR >89 is normal, 60-89 shows mild                           impairment, 30-59 shows moderate impairment, 15-29 shows severe                          impairment and <15 is ESRD.                              His blood sugar is elevated at 123. I recommended a low carbohydrate diet and 5-10 pounds rales. Patient's cholesterol is acceptable. I  recommended a Prevnar 13. The patient defers that for now and return when he is not sick. The patient refuses colonoscopy. I will send the patient home with 3 stool cards to check for fecal occult blood. If these are positive and within consult GI. Patient's blood pressure and cholesterol are well controlled. Patient's thyroid medications and currently does. With regards to his cramps I believe is likely due to y abnormalities likely stemming from his use of diuretics along with his respiratory acidosis. I recommend chloroquine.  He defers for now.  2. Pain in left shin Obtain an x-ray of the left shin rule out malignancy of the bone.  However after the patient's pain is likely neuropathic in nature. - DG Tibia/Fibula Left; Future

## 2014-03-22 ENCOUNTER — Encounter: Payer: Self-pay | Admitting: Family Medicine

## 2014-03-25 ENCOUNTER — Telehealth: Payer: Self-pay | Admitting: Pulmonary Disease

## 2014-03-25 ENCOUNTER — Other Ambulatory Visit: Payer: Self-pay | Admitting: Pulmonary Disease

## 2014-03-25 NOTE — Telephone Encounter (Signed)
VS nurse has sent in refilled into the pharm. Pt aware. Nothing further needed

## 2014-04-17 ENCOUNTER — Ambulatory Visit (INDEPENDENT_AMBULATORY_CARE_PROVIDER_SITE_OTHER): Payer: Medicare Other | Admitting: Pulmonary Disease

## 2014-04-17 ENCOUNTER — Encounter: Payer: Self-pay | Admitting: Pulmonary Disease

## 2014-04-17 VITALS — BP 110/68 | HR 78 | Temp 98.4°F | Ht 64.0 in | Wt 243.8 lb

## 2014-04-17 DIAGNOSIS — J9610 Chronic respiratory failure, unspecified whether with hypoxia or hypercapnia: Secondary | ICD-10-CM

## 2014-04-17 DIAGNOSIS — J9611 Chronic respiratory failure with hypoxia: Secondary | ICD-10-CM

## 2014-04-17 DIAGNOSIS — R0902 Hypoxemia: Secondary | ICD-10-CM

## 2014-04-17 DIAGNOSIS — J449 Chronic obstructive pulmonary disease, unspecified: Secondary | ICD-10-CM

## 2014-04-17 DIAGNOSIS — J310 Chronic rhinitis: Secondary | ICD-10-CM

## 2014-04-17 MED ORDER — ALBUTEROL SULFATE (2.5 MG/3ML) 0.083% IN NEBU: INHALATION_SOLUTION | RESPIRATORY_TRACT | Status: DC

## 2014-04-17 MED ORDER — ALBUTEROL SULFATE HFA 108 (90 BASE) MCG/ACT IN AERS: INHALATION_SPRAY | RESPIRATORY_TRACT | Status: DC

## 2014-04-17 MED ORDER — MONTELUKAST SODIUM 10 MG PO TABS: 10.0000 mg | ORAL_TABLET | Freq: Every day | ORAL | Status: DC

## 2014-04-17 MED ORDER — TIOTROPIUM BROMIDE MONOHYDRATE 18 MCG IN CAPS: ORAL_CAPSULE | RESPIRATORY_TRACT | Status: DC

## 2014-04-17 MED ORDER — ACAPELLA MISC: Status: DC

## 2014-04-17 MED ORDER — BUDESONIDE-FORMOTEROL FUMARATE 160-4.5 MCG/ACT IN AERO: INHALATION_SPRAY | RESPIRATORY_TRACT | Status: DC

## 2014-04-17 NOTE — Assessment & Plan Note (Signed)
Continue 3 liters oxygen 24/7. 

## 2014-04-17 NOTE — Progress Notes (Signed)
Chief Complaint  Patient presents with  . Follow-up    6 mth f/ u - Increased SOB with allergy season - Using Albuterol Neb every 4 hrs - Denies cough or wheezing - Wakes after about 4 hrs due to dry mouth or SOB - Sleeps in recliner frequently - Needs 6 mth refill on all pulmonary meds and also new rx for Accapell device     History of Present Illness: [name] is a 63 y.o. male former smoker with GOLD 3 COPD, and hypoxia  He has noticed more trouble with his breathing when he goes outside.  This is from pollen.  He does better when indoors.  He gets more cough in the AM and wheeze in the PM.  He uses symbicort bid, and spiriva daily.  He uses albuterol every four hours.  He is using 3 liters oxygen 24/7.  He uses singulair and nasacort/claritin OTC.  Tests: PFT 12/24/09>>FEV1 1.60(57%), FEV1% 52, TLC 6.86(130%), DLCO 67%, no BD PSG with 3 liters oxygen 06/12/12>>AHI 1.2, SpO2 low 90%, PLMI 1. Echo 09/27/12>>EF 55 to 60%, mild RA dilation  He  has a past medical history of COPD (chronic obstructive pulmonary disease); Chronic respiratory failure with hypoxia; Anxiety; Dyspnea; Hypothyroidism; Hypertension; Hyperlipidemia; Osteoarthritis; and BPH (benign prostatic hyperplasia).  He  has no past surgical history on file.  Current Outpatient Prescriptions on File Prior to Visit  Medication Sig Dispense Refill  . atorvastatin (LIPITOR) 10 MG tablet Take 0.5 tablets (5 mg total) by mouth daily.  90 tablet  3  . clindamycin (CLEOCIN) 150 MG capsule 4 tabs po x 1 prior to dentist visits      . clotrimazole-betamethasone (LOTRISONE) cream APPLY TWICE A DAY FOR 10 TO 14 DAYS AS DIRECTED  45 g  1  . furosemide (LASIX) 40 MG tablet TAKE TWO TABLETS BY MOUTH TWICE DAILY  360 tablet  3  . Guaifenesin (MUCINEX MAXIMUM STRENGTH) 1200 MG TB12 Take 1 tablet by mouth daily.       Marland Kitchen. HYDROcodone-acetaminophen (NORCO) 5-325 MG per tablet Take 2 tablets by mouth Three times daily as needed.      Marland Kitchen.  levothyroxine (SYNTHROID, LEVOTHROID) 200 MCG tablet TAKE 1 TABLET (200 MCG TOTAL) BY MOUTH DAILY.  90 tablet  3  . loratadine (CLARITIN) 10 MG tablet Take 10 mg by mouth daily.        . Misc. Devices (ACAPELLA) MISC Dx 496  1 each  0  . montelukast (SINGULAIR) 10 MG tablet Take 1 tablet (10 mg total) by mouth at bedtime.  90 tablet  3  . nystatin (MYCOSTATIN) 100000 UNIT/ML suspension Take 500,000 Units by mouth 4 (four) times daily as needed.       . Potassium Chloride ER 20 MEQ TBCR TAKE ONE TABLET BY MOUTH DAILY  90 tablet  3  . ranitidine (ZANTAC) 150 MG tablet Take 150 mg by mouth at bedtime.        Marland Kitchen. Spacer/Aero-Holding Chambers (AEROCHAMBER Z-STAT PLUS) inhaler Use as instructed  1 each  0  . SPIRIVA HANDIHALER 18 MCG inhalation capsule PLACE 1 CAPSULE INTO INHALER AND INHALE ONCE DAILY  30 capsule  5  . sucralfate (CARAFATE) 1 G tablet TAKE 1 TABLET FOUR TIMES A DAY  360 tablet  3  . SYMBICORT 160-4.5 MCG/ACT inhaler INHALE 2 PUFFS INTO LUNGS TWICE DAILY  10 g  3  . tamsulosin (FLOMAX) 0.4 MG CAPS capsule TAKE 1 CAPSULE (0.4 MG TOTAL) BY MOUTH DAILY.  90  capsule  3  . tiZANidine (ZANAFLEX) 4 MG capsule Take 4 mg by mouth 2 (two) times daily.      . valsartan (DIOVAN) 160 MG tablet TAKE 1 TABLET (160 MG TOTAL) BY MOUTH DAILY.  90 tablet  3  . triamcinolone (NASACORT) 55 MCG/ACT nasal inhaler Place 2 sprays into the nose 2 (two) times daily.  1 Inhaler  5  . [DISCONTINUED] potassium chloride SA (K-DUR,KLOR-CON) 20 MEQ tablet TAKE ONE TABLET BY MOUTH DAILY  30 tablet  0   No current facility-administered medications on file prior to visit.    Allergies  Allergen Reactions  . Penicillins     Physical Exam:  General - No distress, wearing oxygen ENT - no sinus tenderness, no oral exudate Cardiac - s1s2 regular, no murmur Chest - Prolonged exhalation, decreased breath sounds, no wheeze/rales Back - No focal tenderness Abd - Soft, non-tender Ext - ankle edema Neuro - Normal  strength Skin - No rashes Psych - Normal mood, and behavior   Assessment/Plan:  Samuel HellingVineet Ruben Pyka, MD [place] [facility]/Critical Care/Sleep Pager:  [phone] 04/17/2014, 2:17 PM

## 2014-04-17 NOTE — Patient Instructions (Signed)
Follow up in 6 months 

## 2014-04-17 NOTE — Assessment & Plan Note (Signed)
Continue spiriva, symbicort, singulair, and prn albuterol.

## 2014-04-17 NOTE — Assessment & Plan Note (Signed)
Continue singulair, nasacort, and claritin.

## 2014-06-27 ENCOUNTER — Other Ambulatory Visit: Payer: Self-pay | Admitting: Family Medicine

## 2014-07-26 ENCOUNTER — Telehealth: Payer: Self-pay | Admitting: Pulmonary Disease

## 2014-07-26 MED ORDER — ALBUTEROL SULFATE (2.5 MG/3ML) 0.083% IN NEBU: INHALATION_SOLUTION | RESPIRATORY_TRACT | Status: DC

## 2014-07-26 NOTE — Telephone Encounter (Signed)
Spoke with pt. Made him aware albuterol neb is already for q4hrs prn. RX sent in. Nothing further needed

## 2014-07-31 ENCOUNTER — Telehealth: Payer: Self-pay | Admitting: Pulmonary Disease

## 2014-07-31 MED ORDER — ALBUTEROL SULFATE (2.5 MG/3ML) 0.083% IN NEBU: INHALATION_SOLUTION | RESPIRATORY_TRACT | Status: DC

## 2014-07-31 NOTE — Telephone Encounter (Signed)
Spoke with patient-states he should be getting more than #[id number] for a month supply of his albuterol nebulizer; I explained to the patient that the #[id number] is the true month supply as his neb should be as needed-if he is having to use this more then he should let us know asap as he is on other daily inhalers that should be holding him over. Pt states he would like for VS to know he has been using his nebulizer almost everyday at 4-6 hours. Pt is not in any acute flare up or distress and aware that VS is back in the office on Friday.

## 2014-07-31 NOTE — Telephone Encounter (Signed)
Okay to send order so that he can use albuterol every four hours if this will be approved by insurance.

## 2014-07-31 NOTE — Telephone Encounter (Signed)
[facility]spoke with Dorene Gre[facility]Natalie at [facility] [address] [facility]. Pt is aware of change in RX-and understands that if his insurance will not approve this change then we will have to drop the QTY back to #[id number] a month---change was given as QTY of #[id number] for a month supply. Updated EPIC to reflect on patients med list. Nothing more needed at this time.

## 2014-09-25 ENCOUNTER — Ambulatory Visit (INDEPENDENT_AMBULATORY_CARE_PROVIDER_SITE_OTHER): Payer: Medicare Other

## 2014-09-25 DIAGNOSIS — Z23 Encounter for immunization: Secondary | ICD-10-CM

## 2014-10-12 ENCOUNTER — Other Ambulatory Visit: Payer: Self-pay | Admitting: Pulmonary Disease

## 2014-10-24 NOTE — Telephone Encounter (Signed)
PW please advise if you would like to refill this cream for the pt.  thanks

## 2014-10-25 ENCOUNTER — Other Ambulatory Visit: Payer: Self-pay | Admitting: Pulmonary Disease

## 2014-10-30 ENCOUNTER — Other Ambulatory Visit: Payer: Self-pay | Admitting: Family Medicine

## 2014-11-05 ENCOUNTER — Encounter: Payer: Self-pay | Admitting: Pulmonary Disease

## 2014-11-05 ENCOUNTER — Ambulatory Visit (INDEPENDENT_AMBULATORY_CARE_PROVIDER_SITE_OTHER): Payer: Medicare Other | Admitting: Pulmonary Disease

## 2014-11-05 ENCOUNTER — Ambulatory Visit: Payer: Medicare Other | Admitting: Pulmonary Disease

## 2014-11-05 VITALS — BP 108/62 | HR 90 | Temp 98.0°F | Ht 64.0 in | Wt 257.0 lb

## 2014-11-05 DIAGNOSIS — R058 Other specified cough: Secondary | ICD-10-CM

## 2014-11-05 DIAGNOSIS — J9611 Chronic respiratory failure with hypoxia: Secondary | ICD-10-CM

## 2014-11-05 DIAGNOSIS — R05 Cough: Secondary | ICD-10-CM

## 2014-11-05 DIAGNOSIS — J449 Chronic obstructive pulmonary disease, unspecified: Secondary | ICD-10-CM

## 2014-11-05 MED ORDER — TIOTROPIUM BROMIDE MONOHYDRATE 18 MCG IN CAPS: 18.0000 ug | ORAL_CAPSULE | Freq: Every day | RESPIRATORY_TRACT | Status: DC

## 2014-11-05 MED ORDER — ACAPELLA MISC: Status: DC

## 2014-11-05 MED ORDER — MONTELUKAST SODIUM 10 MG PO TABS: 10.0000 mg | ORAL_TABLET | Freq: Every day | ORAL | Status: DC

## 2014-11-05 MED ORDER — BUDESONIDE-FORMOTEROL FUMARATE 160-4.5 MCG/ACT IN AERO: INHALATION_SPRAY | RESPIRATORY_TRACT | Status: DC

## 2014-11-05 MED ORDER — ALBUTEROL SULFATE HFA 108 (90 BASE) MCG/ACT IN AERS: INHALATION_SPRAY | RESPIRATORY_TRACT | Status: DC

## 2014-11-05 MED ORDER — ALBUTEROL SULFATE (2.5 MG/3ML) 0.083% IN NEBU: INHALATION_SOLUTION | RESPIRATORY_TRACT | Status: DC

## 2014-11-05 NOTE — Patient Instructions (Signed)
You can get nasacort and claritin over the counter with prescription Follow up in 6 months

## 2014-11-05 NOTE — Progress Notes (Signed)
  Chief Complaint  Patient presents with  . Follow-up    Pt reports that breathing has been stable since last OV. Taking all inhalers and nebulizers as directed. No complaints. Needs refills of inhalers and nebs. Needs Rx for Flutter Valve(printed)     History of Present Illness: [name] is a 63 y.o. male former smoker with GOLD 3 COPD, and hypoxia.  His breathing has been stable.  He gets intermittent cough and sputum.  He denies chest pain, or leg swelling.  He gets occasional wheeze.  He is going through flutter valve every two months.  He is using 3 liters oxygen 24/7.  He uses singulair and nasacort/claritin OTC.  Tests: PFT 12/24/09>>FEV1 1.60(57%), FEV1% 52, TLC 6.86(130%), DLCO 67%, no BD PSG with 3 liters oxygen 06/12/12>>AHI 1.2, SpO2 low 90%, PLMI 1. Echo 09/27/12>>EF 55 to 60%, mild RA dilation  PMHx >> Anxiety, hypothyroidism, HTN, HLD, BPH  PSHx, Medications, Allergies, Fhx, Shx reviewed.  Physical Exam:  General - No distress, wearing oxygen ENT - no sinus tenderness, no oral exudate Cardiac - s1s2 regular, no murmur Chest - Prolonged exhalation, decreased breath sounds, no wheeze/rales Back - No focal tenderness Abd - Soft, non-tender Ext - ankle edema Neuro - Normal strength Skin - No rashes Psych - Normal mood, and behavior   Impression:  COPD with asthma. Plan: - stable on spiriva, symbicort, singulair, and prn albuterol - will show him video again on proper use and cleaning of flutter valve  Chronic respiratory failure with hypoxia. Plan: - he is to continue 3 liters oxygen 24/7  Upper airway cough syndrome with post-nasal drip. Plan: - continue nasacort, singulair, and claritin  Samuel HellingVineet Ngai Parcell, MD [place] [facility]/Critical Care/Sleep Pager:  [phone]

## 2014-11-11 ENCOUNTER — Telehealth: Payer: Self-pay | Admitting: Pulmonary Disease

## 2014-11-11 MED ORDER — ALBUTEROL SULFATE (2.5 MG/3ML) 0.083% IN NEBU: INHALATION_SOLUTION | RESPIRATORY_TRACT | Status: DC

## 2014-11-11 MED ORDER — ALBUTEROL SULFATE HFA 108 (90 BASE) MCG/ACT IN AERS: INHALATION_SPRAY | RESPIRATORY_TRACT | Status: DC

## 2014-11-11 MED ORDER — TIOTROPIUM BROMIDE MONOHYDRATE 18 MCG IN CAPS
18.0000 ug | ORAL_CAPSULE | Freq: Every day | RESPIRATORY_TRACT | Status: DC
Start: 2014-11-11 — End: 2015-12-04

## 2014-11-11 MED ORDER — BUDESONIDE-FORMOTEROL FUMARATE 160-4.5 MCG/ACT IN AERO: INHALATION_SPRAY | RESPIRATORY_TRACT | Status: DC

## 2014-11-11 NOTE — Telephone Encounter (Signed)
Pt requesting 90 day supply on his 3 inhalers and 1 neb med to [facility] [address].  This has been sent.  Nothing further needed.

## 2014-11-27 ENCOUNTER — Telehealth: Payer: Self-pay | Admitting: Pulmonary Disease

## 2014-11-27 MED ORDER — PREDNISONE 10 MG PO TABS: ORAL_TABLET | ORAL | Status: DC

## 2014-11-27 NOTE — Telephone Encounter (Signed)
Okay to send script for prednisone 10 mg >> 3 pills daily for 2 days, 2 pills daily for 2 days, 1 pill daily for 2 days.  He should call back if he is not improved.

## 2014-11-27 NOTE — Telephone Encounter (Signed)
Called and spoke to pt. Informed pt of the recs per VS. Rx sent to preferred pharmacy. Pt verbalized understanding and denied any further questions or concerns at this time.   

## 2014-11-27 NOTE — Telephone Encounter (Signed)
Pt c/o increased sob Xfew days, prod cough with clear/white mucus.  Denies fever.  Is requesting a pred taper or any other recs.    VS please advise.  Thank you!

## 2014-12-11 ENCOUNTER — Telehealth: Payer: Self-pay | Admitting: Pulmonary Disease

## 2014-12-11 MED ORDER — ALBUTEROL SULFATE (2.5 MG/3ML) 0.083% IN NEBU: INHALATION_SOLUTION | RESPIRATORY_TRACT | Status: DC

## 2014-12-11 NOTE — Telephone Encounter (Signed)
Called and spoke with pt and he stated that he will need a 90 day supply of the albuterol for his nebulizer [facility] [address].  This has been sent in for the pt and nothing further is needed.

## 2015-01-14 ENCOUNTER — Ambulatory Visit (INDEPENDENT_AMBULATORY_CARE_PROVIDER_SITE_OTHER): Payer: Medicare Other | Admitting: Family Medicine

## 2015-01-14 ENCOUNTER — Encounter: Payer: Self-pay | Admitting: Family Medicine

## 2015-01-14 VITALS — BP 132/74 | HR 96 | Temp 98.5°F | Resp 22 | Ht 63.0 in | Wt 248.0 lb

## 2015-01-14 DIAGNOSIS — K439 Ventral hernia without obstruction or gangrene: Secondary | ICD-10-CM

## 2015-01-14 DIAGNOSIS — Z0000 Encounter for general adult medical examination without abnormal findings: Secondary | ICD-10-CM

## 2015-01-14 LAB — CBC WITH DIFFERENTIAL/PLATELET
BASOS ABS: 0.1 10*3/uL (ref 0.0–0.1)
BASOS PCT: 1 % (ref 0–1)
Eosinophils Absolute: 0.2 10*3/uL (ref 0.0–0.7)
Eosinophils Relative: 2 % (ref 0–5)
HEMATOCRIT: 43.3 % (ref 39.0–52.0)
HEMOGLOBIN: 14.6 g/dL (ref 13.0–17.0)
Lymphocytes Relative: 19 % (ref 12–46)
Lymphs Abs: 1.7 10*3/uL (ref 0.7–4.0)
MCH: 31.5 pg (ref 26.0–34.0)
MCHC: 33.7 g/dL (ref 30.0–36.0)
MCV: 93.3 fL (ref 78.0–100.0)
MONO ABS: 0.8 10*3/uL (ref 0.1–1.0)
MPV: 10.4 fL (ref 8.6–12.4)
Monocytes Relative: 9 % (ref 3–12)
NEUTROS PCT: 69 % (ref 43–77)
Neutro Abs: 6.1 10*3/uL (ref 1.7–7.7)
Platelets: 211 10*3/uL (ref 150–400)
RBC: 4.64 MIL/uL (ref 4.22–5.81)
RDW: 14.8 % (ref 11.5–15.5)
WBC: 8.8 10*3/uL (ref 4.0–10.5)

## 2015-01-14 LAB — COMPLETE METABOLIC PANEL WITH GFR
ALBUMIN: 4 g/dL (ref 3.5–5.2)
ALT: 29 U/L (ref 0–53)
AST: 20 U/L (ref 0–37)
Alkaline Phosphatase: 87 U/L (ref 39–117)
BUN: 13 mg/dL (ref 6–23)
CO2: 33 meq/L — AB (ref 19–32)
CREATININE: 0.77 mg/dL (ref 0.50–1.35)
Calcium: 9.6 mg/dL (ref 8.4–10.5)
Chloride: 97 mEq/L (ref 96–112)
GFR, Est African American: >89 mL/min
GFR, Est Non African American: >89 mL/min
GLUCOSE: 107 mg/dL — AB (ref 70–99)
Potassium: 4.1 mEq/L (ref 3.5–5.3)
SODIUM: 139 meq/L (ref 135–145)
Total Bilirubin: 0.5 mg/dL (ref 0.2–1.2)
Total Protein: 7.2 g/dL (ref 6.0–8.3)

## 2015-01-14 LAB — LIPID PANEL
CHOL/HDL RATIO: 3.3 ratio
Cholesterol: 111 mg/dL (ref 0–200)
HDL: 34 mg/dL — ABNORMAL LOW (ref >39)
LDL Cholesterol: 37 mg/dL (ref 0–99)
TRIGLYCERIDES: 202 mg/dL — AB (ref <150)
VLDL: 40 mg/dL (ref 0–40)

## 2015-01-14 NOTE — Progress Notes (Signed)
Subjective:    Patient ID: [name], male    DOB: 03-Oct-1951, 64 y.o.   MRN: [id number]  HPI  Patient has a history of a ventral hernia.  This week, he turned to reach something in his car and felt a tearing sensation in the right to middle abd.  Now the hernia has enlarged to 9 x 9 cm and is tender.  He reports normal bowel movements. He denies constipation melena or hematochezia. He denies any nausea or vomiting. The hernia is freely reducible when the patient is in a supine position. Past Medical History  Diagnosis Date  . COPD (chronic obstructive pulmonary disease)   . Chronic respiratory failure with hypoxia   . Anxiety   . Dyspnea   . Hypothyroidism   . Hypertension   . Hyperlipidemia   . Osteoarthritis   . BPH (benign prostatic hyperplasia)    No past surgical history on file. Current Outpatient Prescriptions on File Prior to Visit  Medication Sig Dispense Refill  . albuterol (PROAIR HFA) 108 (90 BASE) MCG/ACT inhaler INHALE 2 PUFFS INTO LUNGS EVERY 4 HOURS AS NEEDED 3 Inhaler 3  . albuterol (PROVENTIL) (2.5 MG/3ML) 0.083% nebulizer solution USE 1 VIAL IN NEBULIZER EVERY 4 HOURS AS NEEDED FOR WHEEZING/SHORTNESS OF BREATH 1080 mL 0  . atorvastatin (LIPITOR) 10 MG tablet Take 0.5 tablets (5 mg total) by mouth daily. 90 tablet 3  . budesonide-formoterol (SYMBICORT) 160-4.5 MCG/ACT inhaler INHALE 2 PUFFS INTO LUNGS TWICE DAILY 3 Inhaler 3  . clindamycin (CLEOCIN) 150 MG capsule 4 tabs po x 1 prior to dentist visits    . clotrimazole-betamethasone (LOTRISONE) cream APPLY TWICE A DAY FOR 10 TO 14 DAYS AS DIRECTED 45 g 1  . furosemide (LASIX) 40 MG tablet TAKE TWO TABLETS BY MOUTH TWICE DAILY 360 tablet 3  . Guaifenesin (MUCINEX MAXIMUM STRENGTH) 1200 MG TB12 Take 1 tablet by mouth daily.     Marland Kitchen HYDROcodone-acetaminophen (NORCO) 10-325 MG per tablet Take 1 tablet by mouth 3 (three) times daily as needed.    Marland Kitchen levothyroxine (SYNTHROID, LEVOTHROID) 200 MCG tablet TAKE 1 TABLET (200  MCG TOTAL) BY MOUTH DAILY. 90 tablet 3  . loratadine (CLARITIN) 10 MG tablet Take 10 mg by mouth daily.      . Misc. Devices (ACAPELLA) MISC Dx 496 1 each 0  . montelukast (SINGULAIR) 10 MG tablet Take 1 tablet (10 mg total) by mouth at bedtime. 30 tablet 5  . nystatin (MYCOSTATIN) 100000 UNIT/ML suspension Take 500,000 Units by mouth 4 (four) times daily as needed.     . Potassium Chloride ER 20 MEQ TBCR TAKE ONE TABLET BY MOUTH DAILY 90 tablet 3  . ranitidine (ZANTAC) 150 MG tablet Take 150 mg by mouth at bedtime.      Marland Kitchen Spacer/Aero-Holding Chambers (AEROCHAMBER Z-STAT PLUS) inhaler Use as instructed 1 each 0  . sucralfate (CARAFATE) 1 G tablet TAKE 1 TABLET FOUR TIMES A DAY 360 tablet 3  . tamsulosin (FLOMAX) 0.4 MG CAPS capsule TAKE 1 CAPSULE (0.4 MG TOTAL) BY MOUTH DAILY. 90 capsule 3  . tiotropium (SPIRIVA HANDIHALER) 18 MCG inhalation capsule Place 1 capsule (18 mcg total) into inhaler and inhale daily. 90 capsule 3  . tiZANidine (ZANAFLEX) 4 MG capsule Take 4 mg by mouth 2 (two) times daily.    . valsartan (DIOVAN) 160 MG tablet TAKE 1 TABLET (160 MG TOTAL) BY MOUTH DAILY. 90 tablet 3  . triamcinolone (NASACORT) 55 MCG/ACT nasal inhaler Place 2 sprays into the  nose 2 (two) times daily. 1 Inhaler 5  . [DISCONTINUED] potassium chloride SA (K-DUR,KLOR-CON) 20 MEQ tablet TAKE ONE TABLET BY MOUTH DAILY 30 tablet 0   No current facility-administered medications on file prior to visit.   Allergies  Allergen Reactions  . Penicillins    History   Social History  . Marital Status: Married    Spouse Name: N/A    Number of Children: N/A  . Years of Education: N/A   Occupational History  . Not on file.   Social History Main Topics  . Smoking status: Former Smoker -- 40 years    Types: Cigarettes    Quit date: 12/13/2010  . Smokeless tobacco: Never Used     Comment: 3-4 cigs a day  . Alcohol Use: Not on file  . Drug Use: Not on file  . Sexual Activity: Not on file   Other Topics  Concern  . Not on file   Social History Narrative     Review of Systems  All other systems reviewed and are negative.      Objective:   Physical Exam  Constitutional: He appears well-developed and well-nourished.  Cardiovascular: Normal rate and regular rhythm.   Pulmonary/Chest: He has wheezes.  Abdominal: Soft. Bowel sounds are normal. He exhibits no distension. There is tenderness. There is no rebound and no guarding.  Vitals reviewed.         Assessment & Plan:  Routine general medical examination at a health care facility - Plan: CBC with Differential/Platelet, COMPLETE METABOLIC PANEL WITH GFR, Lipid panel, PSA  Ventral hernia without obstruction or gangrene  I believe the ventral hernia has enlarged and it is tender because of this but it is definitely not incarcerated today and there is no evidence of a bowel obstruction. Patient is a high risk surgical candidate due to his pulmonary status and therefore I do not believe he is a good candidate for an elective hernia repair. There is no indication for urgent/emergent hernia repair. Therefore I reassured the patient. I recommend going to the hospital if signs or symptoms of incarceration develop but at the present time there is no indication to go to the hospital. The patient would like a complete physical exam and therefore I obtained baseline fasting labs and will review that with him in detail that his physical exam

## 2015-01-15 LAB — PSA: PSA: 0.71 ng/mL (ref <=4.00)

## 2015-01-28 ENCOUNTER — Encounter: Payer: Self-pay | Admitting: Family Medicine

## 2015-01-28 ENCOUNTER — Ambulatory Visit (INDEPENDENT_AMBULATORY_CARE_PROVIDER_SITE_OTHER): Payer: Medicare Other | Admitting: Family Medicine

## 2015-01-28 ENCOUNTER — Other Ambulatory Visit: Payer: Self-pay | Admitting: Family Medicine

## 2015-01-28 VITALS — BP 108/60 | HR 108 | Temp 98.3°F | Resp 20 | Wt 250.0 lb

## 2015-01-28 DIAGNOSIS — Z0000 Encounter for general adult medical examination without abnormal findings: Secondary | ICD-10-CM

## 2015-01-28 DIAGNOSIS — Z23 Encounter for immunization: Secondary | ICD-10-CM

## 2015-01-28 DIAGNOSIS — Z1211 Encounter for screening for malignant neoplasm of colon: Secondary | ICD-10-CM

## 2015-01-28 NOTE — Progress Notes (Signed)
Subjective:    Patient ID: [name], male    DOB: March 02, 1951, 64 y.o.   MRN: [id number]  HPI Patient is a pleasant 64 year old white male with a history of oxygen dependent COPD area his COPD is severe. He has chronic dyspnea on exertion. He is overdue for colonoscopy. Obviously the patient and I are concerned about him undergoing a colonoscopy given his respiratory status. We had a long discussion today about the risk versus benefit of colonoscopy. I recommended that we start with stool cards 3. If there is blood on his stool cards, then I believe the benefits outweigh the risk and the patient should undergo colonoscopy. The patient understands that the stool cards are not 100% effective for catching cancer however given the risk of anesthesia with a colonoscopy given his precarious respiratory status, the patient is comfortable with this is an initial screening tool. Patient has had his old pneumonia vaccine. He is due for Prevnar 13. He is also due for the shingles vaccine. His flu shot is up-to-date. He is due for prostate exam. His most recent lab work as listed below: Office Visit on 01/14/2015  Component Date Value Ref Range Status  . WBC 01/14/2015 8.8  4.0 - 10.5 K/uL Final  . RBC 01/14/2015 4.64  4.22 - 5.81 MIL/uL Final  . Hemoglobin 01/14/2015 14.6  13.0 - 17.0 g/dL Final  . HCT 01/14/2015 43.3  39.0 - 52.0 % Final  . MCV 01/14/2015 93.3  78.0 - 100.0 fL Final  . MCH 01/14/2015 31.5  26.0 - 34.0 pg Final  . MCHC 01/14/2015 33.7  30.0 - 36.0 g/dL Final  . RDW 01/14/2015 14.8  11.5 - 15.5 % Final  . Platelets 01/14/2015 211  150 - 400 K/uL Final  . MPV 01/14/2015 10.4  8.6 - 12.4 fL Final  . Neutrophils Relative % 01/14/2015 69  43 - 77 % Final  . Neutro Abs 01/14/2015 6.1  1.7 - 7.7 K/uL Final  . Lymphocytes Relative 01/14/2015 19  12 - 46 % Final  . Lymphs Abs 01/14/2015 1.7  0.7 - 4.0 K/uL Final  . Monocytes Relative 01/14/2015 9  3 - 12 % Final  . Monocytes Absolute  01/14/2015 0.8  0.1 - 1.0 K/uL Final  . Eosinophils Relative 01/14/2015 2  0 - 5 % Final  . Eosinophils Absolute 01/14/2015 0.2  0.0 - 0.7 K/uL Final  . Basophils Relative 01/14/2015 1  0 - 1 % Final  . Basophils Absolute 01/14/2015 0.1  0.0 - 0.1 K/uL Final  . Smear Review 01/14/2015 Criteria for review not met   Final  . Sodium 01/14/2015 139  135 - 145 mEq/L Final  . Potassium 01/14/2015 4.1  3.5 - 5.3 mEq/L Final  . Chloride 01/14/2015 97  96 - 112 mEq/L Final  . CO2 01/14/2015 33* 19 - 32 mEq/L Final  . Glucose, Bld 01/14/2015 107* 70 - 99 mg/dL Final  . BUN 01/14/2015 13  6 - 23 mg/dL Final  . Creat 01/14/2015 0.77  0.50 - 1.35 mg/dL Final  . Total Bilirubin 01/14/2015 0.5  0.2 - 1.2 mg/dL Final  . Alkaline Phosphatase 01/14/2015 87  39 - 117 U/L Final  . AST 01/14/2015 20  0 - 37 U/L Final  . ALT 01/14/2015 29  0 - 53 U/L Final  . Total Protein 01/14/2015 7.2  6.0 - 8.3 g/dL Final  . Albumin 01/14/2015 4.0  3.5 - 5.2 g/dL Final  . Calcium 01/14/2015 9.6  8.4 -  10.5 mg/dL Final  . GFR, Est African American 01/14/2015 >89   Final  . GFR, Est Non African American 01/14/2015 >89   Final   Comment:   The estimated GFR is a calculation valid for adults (>=41 years old) that uses the CKD-EPI algorithm to adjust for age and sex. It is   not to be used for children, pregnant women, hospitalized patients,    patients on dialysis, or with rapidly changing kidney function. According to the NKDEP, eGFR >89 is normal, 60-89 shows mild impairment, 30-59 shows moderate impairment, 15-29 shows severe impairment and <15 is ESRD.     Marland Kitchen Cholesterol 01/14/2015 111  0 - 200 mg/dL Final   Comment: ATP III Classification:       < 200        mg/dL        Desirable      200 - 239     mg/dL        Borderline High      >= 240        mg/dL        High     . Triglycerides 01/14/2015 202* <150 mg/dL Final  . HDL 01/14/2015 34* >39 mg/dL Final  . Total CHOL/HDL Ratio 01/14/2015 3.3   Final  . VLDL  01/14/2015 40  0 - 40 mg/dL Final  . LDL Cholesterol 01/14/2015 37  0 - 99 mg/dL Final   Comment:   Total Cholesterol/HDL Ratio:CHD Risk                        Coronary Heart Disease Risk Table                                        Men       Women          1/2 Average Risk              3.4        3.3              Average Risk              5.0        4.4           2X Average Risk              9.6        7.1           3X Average Risk             23.4       11.0 Use the calculated Patient Ratio above and the CHD Risk table  to determine the patient's CHD Risk. ATP III Classification (LDL):       < 100        mg/dL         Optimal      100 - 129     mg/dL         Near or Above Optimal      130 - 159     mg/dL         Borderline High      160 - 189     mg/dL         High       > 190  mg/dL         Very High     . PSA 01/14/2015 0.71  <=4.00 ng/mL Final   Comment: Test Methodology: ECLIA PSA (Electrochemiluminescence Immunoassay)   For PSA values from 2.5-4.0, particularly in younger men <91 years old, the AUA and NCCN suggest testing for % Free PSA (3515) and evaluation of the rate of increase in PSA (PSA velocity).    Past Medical History  Diagnosis Date  . COPD (chronic obstructive pulmonary disease)   . Chronic respiratory failure with hypoxia   . Anxiety   . Dyspnea   . Hypothyroidism   . Hypertension   . Hyperlipidemia   . Osteoarthritis   . BPH (benign prostatic hyperplasia)    No past surgical history on file. Current Outpatient Prescriptions on File Prior to Visit  Medication Sig Dispense Refill  . albuterol (PROAIR HFA) 108 (90 BASE) MCG/ACT inhaler INHALE 2 PUFFS INTO LUNGS EVERY 4 HOURS AS NEEDED 3 Inhaler 3  . albuterol (PROVENTIL) (2.5 MG/3ML) 0.083% nebulizer solution USE 1 VIAL IN NEBULIZER EVERY 4 HOURS AS NEEDED FOR WHEEZING/SHORTNESS OF BREATH 1080 mL 0  . atorvastatin (LIPITOR) 10 MG tablet Take 0.5 tablets (5 mg total) by mouth daily. 90 tablet 3  .  budesonide-formoterol (SYMBICORT) 160-4.5 MCG/ACT inhaler INHALE 2 PUFFS INTO LUNGS TWICE DAILY 3 Inhaler 3  . clindamycin (CLEOCIN) 150 MG capsule 4 tabs po x 1 prior to dentist visits    . clotrimazole-betamethasone (LOTRISONE) cream APPLY TWICE A DAY FOR 10 TO 14 DAYS AS DIRECTED 45 g 1  . furosemide (LASIX) 40 MG tablet TAKE TWO TABLETS BY MOUTH TWICE DAILY 360 tablet 3  . Guaifenesin (MUCINEX MAXIMUM STRENGTH) 1200 MG TB12 Take 1 tablet by mouth daily.     Marland Kitchen HYDROcodone-acetaminophen (NORCO) 10-325 MG per tablet Take 1 tablet by mouth 3 (three) times daily as needed.    Marland Kitchen levothyroxine (SYNTHROID, LEVOTHROID) 200 MCG tablet TAKE 1 TABLET (200 MCG TOTAL) BY MOUTH DAILY. 90 tablet 3  . loratadine (CLARITIN) 10 MG tablet Take 10 mg by mouth daily.      . Misc. Devices (ACAPELLA) MISC Dx 496 1 each 0  . montelukast (SINGULAIR) 10 MG tablet Take 1 tablet (10 mg total) by mouth at bedtime. 30 tablet 5  . nystatin (MYCOSTATIN) 100000 UNIT/ML suspension Take 500,000 Units by mouth 4 (four) times daily as needed.     . Potassium Chloride ER 20 MEQ TBCR TAKE ONE TABLET BY MOUTH DAILY 90 tablet 3  . ranitidine (ZANTAC) 150 MG tablet Take 150 mg by mouth at bedtime.      Marland Kitchen Spacer/Aero-Holding Chambers (AEROCHAMBER Z-STAT PLUS) inhaler Use as instructed 1 each 0  . sucralfate (CARAFATE) 1 G tablet TAKE 1 TABLET FOUR TIMES A DAY 360 tablet 3  . tamsulosin (FLOMAX) 0.4 MG CAPS capsule TAKE 1 CAPSULE (0.4 MG TOTAL) BY MOUTH DAILY. 90 capsule 3  . tiotropium (SPIRIVA HANDIHALER) 18 MCG inhalation capsule Place 1 capsule (18 mcg total) into inhaler and inhale daily. 90 capsule 3  . tiZANidine (ZANAFLEX) 4 MG capsule Take 4 mg by mouth 2 (two) times daily.    Marland Kitchen triamcinolone (NASACORT) 55 MCG/ACT nasal inhaler Place 2 sprays into the nose 2 (two) times daily. 1 Inhaler 5  . valsartan (DIOVAN) 160 MG tablet TAKE 1 TABLET (160 MG TOTAL) BY MOUTH DAILY. 90 tablet 3  . [DISCONTINUED] potassium chloride SA  (K-DUR,KLOR-CON) 20 MEQ tablet TAKE ONE TABLET BY MOUTH DAILY 30 tablet 0  No current facility-administered medications on file prior to visit.   Allergies  Allergen Reactions  . Penicillins    History   Social History  . Marital Status: Married    Spouse Name: N/A  . Number of Children: N/A  . Years of Education: N/A   Occupational History  . Not on file.   Social History Main Topics  . Smoking status: Former Smoker -- 40 years    Types: Cigarettes    Quit date: 12/13/2010  . Smokeless tobacco: Never Used     Comment: 3-4 cigs a day  . Alcohol Use: Not on file  . Drug Use: Not on file  . Sexual Activity: Not on file   Other Topics Concern  . Not on file   Social History Narrative   No family history on file.    Review of Systems  All other systems reviewed and are negative.      Objective:   Physical Exam  Constitutional: He is oriented to person, place, and time. He appears well-developed and well-nourished. No distress.  HENT:  Head: Normocephalic and atraumatic.  Right Ear: External ear normal.  Left Ear: External ear normal.  Nose: Nose normal.  Mouth/Throat: Oropharynx is clear and moist. No oropharyngeal exudate.  Eyes: Conjunctivae and EOM are normal. Pupils are equal, round, and reactive to light. Right eye exhibits no discharge. Left eye exhibits no discharge. No scleral icterus.  Neck: Normal range of motion. Neck supple. No JVD present. No tracheal deviation present. No thyromegaly present.  Cardiovascular: Normal rate, regular rhythm, normal heart sounds and intact distal pulses.  Exam reveals no friction rub.   No murmur heard. Pulmonary/Chest: Accessory muscle usage present. No stridor. No respiratory distress. He has decreased breath sounds. He has wheezes. He has no rhonchi. He has no rales.  Abdominal: Soft. Bowel sounds are normal. He exhibits no distension and no mass. There is no tenderness. There is no rebound and no guarding.    Genitourinary: Rectum normal and prostate normal.  Musculoskeletal: Normal range of motion. He exhibits no edema or tenderness.  Lymphadenopathy:    He has no cervical adenopathy.  Neurological: He is alert and oriented to person, place, and time. He has normal reflexes. He displays normal reflexes. No cranial nerve deficit. He exhibits normal muscle tone. Coordination normal.  Skin: Skin is warm. No rash noted. He is not diaphoretic. No erythema. No pallor.  Psychiatric: He has a normal mood and affect. His behavior is normal. Judgment and thought content normal.  Vitals reviewed.         Assessment & Plan:  Colon cancer screening - Plan: Fecal occult blood, imunochemical, Fecal occult blood, imunochemical, Fecal occult blood, imunochemical  Need for prophylactic vaccination against Streptococcus pneumoniae (pneumococcus) - Plan: Pneumococcal conjugate vaccine 13-valent IM  Routine general medical examination at a health care facility  Patient's physical exam is significant only for his chronic respiratory failure and oxygen dependent COPD. It is also significant for his obesity and his prediabetes. I recommended a low carbohydrate diet also recommended weight loss. After a long discussion with the patient, we both agree that the best screening tool for this patient for his colon cancer screening would be stool cards 3. PSA is normal and prostate exam is normal. Patient received Prevnar 13 today. I recommended a shingles vaccine. The patient will check on the cost prior to receiving this vaccine. The remainder of his cancer screening is up-to-date. We also discussed his lab work in  detail his lab work is excellent aside from low HDL and prediabetes both of which are due to his obesity and inactivity.

## 2015-02-28 ENCOUNTER — Other Ambulatory Visit: Payer: Self-pay | Admitting: Pulmonary Disease

## 2015-02-28 ENCOUNTER — Telehealth: Payer: Self-pay | Admitting: Family Medicine

## 2015-02-28 MED ORDER — LEVOTHYROXINE SODIUM 200 MCG PO TABS: ORAL_TABLET | ORAL | Status: DC

## 2015-02-28 NOTE — Telephone Encounter (Signed)
Med sent to pharm 

## 2015-02-28 NOTE — Telephone Encounter (Signed)
Patient calling to get refill on levothyroxine if possible he is going to be out this weekend, please call him at [phone] [facility]

## 2015-03-04 ENCOUNTER — Other Ambulatory Visit: Payer: Self-pay | Admitting: Family Medicine

## 2015-03-05 ENCOUNTER — Telehealth: Payer: Self-pay | Admitting: Family Medicine

## 2015-03-05 MED ORDER — MONTELUKAST SODIUM 10 MG PO TABS: 10.0000 mg | ORAL_TABLET | Freq: Every day | ORAL | Status: DC

## 2015-03-05 NOTE — Telephone Encounter (Signed)
Patient calling to say when his wife picked up his prescriptions there were some missing  tamsulosin  Losartan Singulair furosemide     [phone] (M

## 2015-03-05 NOTE — Telephone Encounter (Signed)
Medication refilled per protocol. 

## 2015-03-30 ENCOUNTER — Other Ambulatory Visit: Payer: Self-pay | Admitting: Family Medicine

## 2015-04-02 ENCOUNTER — Other Ambulatory Visit: Payer: Self-pay | Admitting: Pulmonary Disease

## 2015-04-02 NOTE — Telephone Encounter (Signed)
PROVENTIL REFILLED 0 REFILL MUST CALL OFFICE TO SCHEDULE APPT IN MAY 2016.

## 2015-04-04 ENCOUNTER — Telehealth: Payer: Self-pay | Admitting: Pulmonary Disease

## 2015-04-04 MED ORDER — AZITHROMYCIN 250 MG PO TABS: ORAL_TABLET | ORAL | Status: DC

## 2015-04-04 NOTE — Telephone Encounter (Signed)
OK by me for Zpack. Come to clinic if worsening symptoms such as dyspnea, cough, fever

## 2015-04-04 NOTE — Telephone Encounter (Signed)
Called and spoke to pt. Informed pt of the recs per BQ. Rx sent to preferred pharmacy. Pt verbalized understanding and denied any further questions or concerns at this time.

## 2015-04-04 NOTE — Telephone Encounter (Signed)
Chest congestion, coughing up milky phlegm.  Patient would like a Zpak. Patient says that usually when this happens, a zpak clears it up.  No fever. [facility].  To BQ in VS absence.

## 2015-05-03 ENCOUNTER — Other Ambulatory Visit: Payer: Self-pay | Admitting: Family Medicine

## 2015-05-03 ENCOUNTER — Other Ambulatory Visit: Payer: Self-pay | Admitting: Pulmonary Disease

## 2015-05-05 NOTE — Telephone Encounter (Signed)
Pt has f/u appt 06/10/15 with Dr. Craige CottaSood Proventil for neb has been refilled.

## 2015-06-06 ENCOUNTER — Other Ambulatory Visit: Payer: Self-pay | Admitting: Pulmonary Disease

## 2015-06-06 ENCOUNTER — Telehealth: Payer: Self-pay | Admitting: Pulmonary Disease

## 2015-06-06 NOTE — Telephone Encounter (Signed)
Called pharmacy, pharmacist had no notes on the pt for a PA at this time.  Will close message.

## 2015-06-06 NOTE — Telephone Encounter (Signed)
Pt has appt upcoming 05/2015

## 2015-06-10 ENCOUNTER — Encounter: Payer: Self-pay | Admitting: Pulmonary Disease

## 2015-06-10 ENCOUNTER — Ambulatory Visit (INDEPENDENT_AMBULATORY_CARE_PROVIDER_SITE_OTHER): Payer: Medicare Other | Admitting: Pulmonary Disease

## 2015-06-10 VITALS — BP 150/82 | HR 85 | Ht 64.0 in | Wt 243.8 lb

## 2015-06-10 DIAGNOSIS — J449 Chronic obstructive pulmonary disease, unspecified: Secondary | ICD-10-CM

## 2015-06-10 DIAGNOSIS — R05 Cough: Secondary | ICD-10-CM

## 2015-06-10 DIAGNOSIS — J9611 Chronic respiratory failure with hypoxia: Secondary | ICD-10-CM

## 2015-06-10 DIAGNOSIS — R058 Other specified cough: Secondary | ICD-10-CM

## 2015-06-10 DIAGNOSIS — Z87891 Personal history of nicotine dependence: Secondary | ICD-10-CM | POA: Diagnosis not present

## 2015-06-10 MED ORDER — LORAZEPAM 0.5 MG PO TABS: 0.5000 mg | ORAL_TABLET | Freq: Four times a day (QID) | ORAL | Status: DC | PRN

## 2015-06-10 NOTE — Patient Instructions (Signed)
Follow up in 6 months 

## 2015-06-10 NOTE — Progress Notes (Signed)
  Chief Complaint  Patient presents with  . Follow-up    Pt reports that breathing has been doing okay since last seen - using Mucinex DM BID for congestion PRN.      History of Present Illness: [name] is a 64 y.o. male former smoker with GOLD 3 COPD, and hypoxia.  His breathing is about the same.  He still gets cough with clear sputum.  He is not having much wheeze.  He denies chest pain or leg edema.  Tests: PFT 12/24/09>>FEV1 1.60(57%), FEV1% 52, TLC 6.86(130%), DLCO 67%, no BD PSG with 3 liters oxygen 06/12/12>>AHI 1.2, SpO2 low 90%, PLMI 1. Echo 09/27/12>>EF 55 to 60%, mild RA dilation  PMHx >> Anxiety, hypothyroidism, HTN, HLD, BPH  PSHx, Medications, Allergies, Fhx, Shx reviewed.  Physical Exam: BP 150/82 mmHg  Pulse 85  Ht 5\' 4"  (1.626 m)  Wt 243 lb 12.8 oz (110.587 kg)  BMI 41.83 kg/m2  SpO2 94%  General - No distress, wearing oxygen ENT - no sinus tenderness, no oral exudate Cardiac - s1s2 regular, no murmur Chest - Prolonged exhalation, decreased breath sounds, no wheeze/rales Back - No focal tenderness Abd - Soft, non-tender Ext - ankle edema Neuro - Normal strength Skin - No rashes Psych - Normal mood, and behavior   Impression:  COPD with asthma. Plan: - stable on spiriva, symbicort, singulair, and prn albuterol - continue flutter valve  Chronic respiratory failure with hypoxia. Plan: - he is to continue 3 liters oxygen 24/7  Upper airway cough syndrome with post-nasal drip. Plan: - continue nasacort, singulair, and claritin  Lung cancer screening. He quit smoking in 2012.  He has 40 pack yr hx of smoking. Plan: - had Kandice RobinsonsSarah Groce, NP speak to him about low dose CT chest screening procedure, and provider information on this - he will d/w his wife and notify us whether he would like to proceed with screening    Coralyn HellingVineet Annaleia Pence, MD [facility] Pager:  [phone]

## 2015-06-12 ENCOUNTER — Telehealth: Payer: Self-pay | Admitting: Acute Care

## 2015-06-12 NOTE — Telephone Encounter (Signed)
I called and left a message for Mr. Loy to return my call. He has my contact information.

## 2015-07-02 ENCOUNTER — Other Ambulatory Visit: Payer: Self-pay | Admitting: Pulmonary Disease

## 2015-08-04 ENCOUNTER — Encounter: Payer: Self-pay | Admitting: Physician Assistant

## 2015-08-04 ENCOUNTER — Ambulatory Visit (INDEPENDENT_AMBULATORY_CARE_PROVIDER_SITE_OTHER): Payer: Medicare Other | Admitting: Physician Assistant

## 2015-08-04 VITALS — BP 130/78 | HR 86 | Temp 98.6°F | Resp 22 | Ht 64.0 in | Wt 240.0 lb

## 2015-08-04 DIAGNOSIS — J988 Other specified respiratory disorders: Secondary | ICD-10-CM | POA: Diagnosis not present

## 2015-08-04 DIAGNOSIS — J9611 Chronic respiratory failure with hypoxia: Secondary | ICD-10-CM

## 2015-08-04 DIAGNOSIS — J449 Chronic obstructive pulmonary disease, unspecified: Secondary | ICD-10-CM

## 2015-08-04 DIAGNOSIS — J029 Acute pharyngitis, unspecified: Secondary | ICD-10-CM

## 2015-08-04 DIAGNOSIS — J22 Unspecified acute lower respiratory infection: Secondary | ICD-10-CM

## 2015-08-04 LAB — RAPID STREP SCREEN (MED CTR MEBANE ONLY): STREPTOCOCCUS, GROUP A SCREEN (DIRECT): NEGATIVE

## 2015-08-04 MED ORDER — AZITHROMYCIN 250 MG PO TABS: ORAL_TABLET | ORAL | Status: DC

## 2015-08-04 NOTE — Progress Notes (Signed)
Patient ID: [name] MRN: [id number], DOB: [birth year], 64 y.o. Date of Encounter: 08/04/2015, 4:08 PM    Chief Complaint:  Chief Complaint  Patient presents with  . Illness    sore throat, chest congestion, HA, light headedness     HPI: 64 y.o. year old white male on oxygen presents with above complaints.   Says that these symptoms just started this morning. Says that his throat continues to feel a little sore. Says that he felt chest congestion this morning mostly but has continued to have a little congestion in his chest. No head/nasal congestion. No mucus from the nose. No fevers or chills.  Says that anytime he feels chest congestion he calls pulmonary and they prescribed him Z-Pak. I do see a phone note from 04/04/2015 he call pulmonary and they did call him in a Z-Pak.     Home Meds:   Outpatient Prescriptions Prior to Visit  Medication Sig Dispense Refill  . albuterol (PROAIR HFA) 108 (90 BASE) MCG/ACT inhaler INHALE 2 PUFFS INTO LUNGS EVERY 4 HOURS AS NEEDED 3 Inhaler 3  . albuterol (PROVENTIL) (2.5 MG/3ML) 0.083% nebulizer solution USE 1 VIAL IN NEBULIZER EVERY 4 HOURS AS NEEDED FOR WHEEZING/SHORTNESS OF BREATH 525 mL 2  . atorvastatin (LIPITOR) 10 MG tablet TAKE 0.5 TABLETS (5 MG TOTAL) BY MOUTH DAILY. 90 tablet 2  . budesonide-formoterol (SYMBICORT) 160-4.5 MCG/ACT inhaler INHALE 2 PUFFS INTO LUNGS TWICE DAILY 3 Inhaler 3  . clotrimazole-betamethasone (LOTRISONE) cream APPLY TWICE A DAY FOR 10 TO 14 DAYS AS DIRECTED 45 g 1  . DULoxetine (CYMBALTA) 60 MG capsule Take 1 capsule by mouth daily.  0  . furosemide (LASIX) 40 MG tablet TAKE TWO TABLETS BY MOUTH TWICE DAILY 360 tablet 3  . Guaifenesin (MUCINEX MAXIMUM STRENGTH) 1200 MG TB12 Take 1 tablet by mouth daily.     Marland Kitchen HYDROcodone-acetaminophen (NORCO) 10-325 MG per tablet Take 1 tablet by mouth every 6 (six) hours as needed.     Marland Kitchen KLOR-CON M20 20 MEQ tablet TAKE ONE TABLET BY MOUTH DAILY 90 tablet 3  .  levothyroxine (SYNTHROID, LEVOTHROID) 200 MCG tablet TAKE 1 TABLET (200 MCG TOTAL) BY MOUTH DAILY. 90 tablet 3  . loratadine (CLARITIN) 10 MG tablet Take 10 mg by mouth daily.      Marland Kitchen LORazepam (ATIVAN) 0.5 MG tablet Take 1 tablet (0.5 mg total) by mouth every 6 (six) hours as needed for anxiety. 30 tablet 1  . Misc. Devices (ACAPELLA) MISC Dx 496 1 each 0  . montelukast (SINGULAIR) 10 MG tablet Take 1 tablet (10 mg total) by mouth at bedtime. 90 tablet 3  . ranitidine (ZANTAC) 150 MG tablet Take 150 mg by mouth at bedtime.      Marland Kitchen Spacer/Aero-Holding Chambers (AEROCHAMBER Z-STAT PLUS) inhaler Use as instructed 1 each 0  . sucralfate (CARAFATE) 1 G tablet TAKE 1 TABLET FOUR TIMES A DAY 360 tablet 2  . tamsulosin (FLOMAX) 0.4 MG CAPS capsule TAKE 1 CAPSULE (0.4 MG TOTAL) BY MOUTH DAILY. 90 capsule 3  . tiotropium (SPIRIVA HANDIHALER) 18 MCG inhalation capsule Place 1 capsule (18 mcg total) into inhaler and inhale daily. 90 capsule 3  . tiZANidine (ZANAFLEX) 4 MG capsule Take 4 mg by mouth 2 (two) times daily.    . valsartan (DIOVAN) 160 MG tablet TAKE 1 TABLET (160 MG TOTAL) BY MOUTH DAILY. 90 tablet 3  . clindamycin (CLEOCIN) 150 MG capsule 4 tabs po x 1 prior to dentist visits    .  triamcinolone (NASACORT) 55 MCG/ACT nasal inhaler Place 2 sprays into the nose 2 (two) times daily. 1 Inhaler 5   No facility-administered medications prior to visit.    Allergies:  Allergies  Allergen Reactions  . Penicillins       Review of Systems: See HPI for pertinent ROS. All other ROS negative.    Physical Exam: Blood pressure 130/78, pulse 86, temperature 98.6 F (37 C), temperature source Oral, resp. rate 22, height  (1.626 m), weight 240 lb (108.863 kg), SpO2 94 %., Body mass index is 41.18 kg/(m^2). General:  WM. On Oxygen Nasal Canula. Appears in no acute distress. HEENT: Normocephalic, atraumatic, eyes without discharge, sclera non-icteric, nares are without discharge. Bilateral auditory  canals clear, TM's are without perforation, pearly grey and translucent with reflective cone of light bilaterally. Oral cavity moist.  Posterior pharynx with mild erythema. No exudate. No peritonsillar abscess.  Neck: Supple. No thyromegaly. No lymphadenopathy. Lungs: Decreased/distant breath sounds throughout. Hear no wheezes rhonchi or rales. Heart: Regular rhythm. No murmurs, rubs, or gallops. Msk:  Strength and tone normal for age. Extremities/Skin: Warm and dry.  No rashes. Neuro: Alert and oriented X 3. Moves all extremities spontaneously. Gait is normal. CNII-XII grossly in tact. Psych:  Responds to questions appropriately with a normal affect.   Results for orders placed or performed in visit on 08/04/15  Rapid Strep Screen  Result Value Ref Range   Source THROAT    Streptococcus, Group A Screen (Direct) NEG NEGATIVE     ASSESSMENT AND PLAN:  64 y.o. year old male with  1. Acute respiratory infection Discussed this might be a viral infection in which case it would run its course and resolve on its own. However he says that he is very concerned that given his other lung problems that if he does not go ahead and start antibiotic he could develop serious problems. Will go ahead and use the azithromycin. He is to follow-up if symptoms worsen or do not return to baseline within 1 week after completion of azithromycin. - azithromycin (ZITHROMAX) 250 MG tablet; Day 1: Take 2 daily. Days 2-5: Take 1 daily.  Dispense: 6 tablet; Refill: 0  2. Acute pharyngitis, unspecified pharyngitis type - Rapid Strep Screen  3. Chronic respiratory failure with hypoxia  4. COPD with asthma   Signed, Shon Hale [place], [place], [facility] 08/04/2015 4:08 PM

## 2015-08-11 ENCOUNTER — Telehealth: Payer: Self-pay | Admitting: Internal Medicine

## 2015-08-11 NOTE — Telephone Encounter (Signed)
Called spoke with pt. Acute visit scheduled with BQ tomorrow at 10:45. Nothing further needed

## 2015-08-12 ENCOUNTER — Encounter: Payer: Self-pay | Admitting: Pulmonary Disease

## 2015-08-12 ENCOUNTER — Ambulatory Visit (INDEPENDENT_AMBULATORY_CARE_PROVIDER_SITE_OTHER): Payer: Medicare Other | Admitting: Pulmonary Disease

## 2015-08-12 VITALS — BP 140/60 | HR 122 | Ht 64.0 in | Wt 244.0 lb

## 2015-08-12 DIAGNOSIS — J9611 Chronic respiratory failure with hypoxia: Secondary | ICD-10-CM | POA: Diagnosis not present

## 2015-08-12 DIAGNOSIS — J209 Acute bronchitis, unspecified: Secondary | ICD-10-CM | POA: Diagnosis not present

## 2015-08-12 DIAGNOSIS — J449 Chronic obstructive pulmonary disease, unspecified: Secondary | ICD-10-CM | POA: Diagnosis not present

## 2015-08-12 DIAGNOSIS — R058 Other specified cough: Secondary | ICD-10-CM

## 2015-08-12 DIAGNOSIS — J019 Acute sinusitis, unspecified: Secondary | ICD-10-CM | POA: Diagnosis not present

## 2015-08-12 DIAGNOSIS — R05 Cough: Secondary | ICD-10-CM

## 2015-08-12 MED ORDER — PREDNISONE 10 MG PO TABS: ORAL_TABLET | ORAL | Status: DC

## 2015-08-12 MED ORDER — LEVOFLOXACIN 500 MG PO TABS: 500.0000 mg | ORAL_TABLET | Freq: Every day | ORAL | Status: DC

## 2015-08-12 NOTE — Patient Instructions (Signed)
Levaquin 500 mg pill >> one pill daily for 7 days Take prednisone only if breathing gets worse >> call if he need to start taking prednisone Follow up in 3 months

## 2015-08-12 NOTE — Progress Notes (Signed)
  Chief Complaint  Patient presents with  . Acute Visit    VS pt here for increased sob, sore throat, prod cough with clear mucus Xfew days.      History of Present Illness: [name] is a 64 y.o. male former smoker with GOLD 3 COPD, and hypoxia.  His wife got a cold form work about 2 weeks ago.  He thinks he picked up her cold last week.    He has been getting sinus congestion and post nasal drip.  His throat feels scratchy.  He feels achy and gets chilled.  He is coughing up milky sputum.  He feels albuterol helps control his congestion and wheezing.    He was seen by PA at his PCP office and given course of zithromax.  This helped, but didn't fully resolve his symptoms.  Tests: PFT 12/24/09>>FEV1 1.60(57%), FEV1% 52, TLC 6.86(130%), DLCO 67%, no BD PSG with 3 liters oxygen 06/12/12>>AHI 1.2, SpO2 low 90%, PLMI 1. Echo 09/27/12>>EF 55 to 60%, mild RA dilation  PMHx >> Anxiety, hypothyroidism, HTN, HLD, BPH  PSHx, Medications, Allergies, Fhx, Shx reviewed.  Physical Exam: BP 140/60 mmHg  Pulse 122  Ht  (1.626 m)  Wt 244 lb (110.678 kg)  BMI 41.86 kg/m2  SpO2 91%  General - appears fatigued, wearing oxygen ENT - mild tenderness over Lt frontal and maxillary sinuses, clear to yellow drainage, TM clear on Rt, tube in ear canal on Lt, no oral exudate Cardiac - s1s2 regular, no murmur Chest - Prolonged exhalation, decreased breath sounds, no wheeze/rales Back - No focal tenderness Abd - Soft, non-tender Ext - mild ankle edema Neuro - Normal strength Skin - No rashes Psych - Normal mood, and behavior   Impression:  Acute sinusitis with acute bronchitis. He has allergy to PCN. Plan: - will give him course of levaquin - given him script for prednisone, but advised him to not take unless his wheezing get worse >> asked him to call if he needs to start taking prednisone - advised he can try using mucinex to help with cough and expectoration  COPD with  asthma. Plan: - continue spiriva, symbicort, singulair, and prn albuterol - continue flutter valve  Chronic respiratory failure with hypoxia. Plan: - he is to continue 3 liters oxygen 24/7  Upper airway cough syndrome with post-nasal drip. Plan: - continue nasacort, singulair, and claritin  Lung cancer screening. He quit smoking in 2012.  He has 40 pack yr hx of smoking. Plan: - he will contact Kandice Robinsons, NP to set up low dose CT chest screening program once he has decided he wants to do this   Samuel Helling, MD [facility] Pager:  [phone]

## 2015-08-14 ENCOUNTER — Telehealth: Payer: Self-pay | Admitting: Pulmonary Disease

## 2015-08-14 NOTE — Telephone Encounter (Signed)
Per 08/12/15: Patient Instructions       Levaquin 500 mg pill >> one pill daily for 7 days Take prednisone only if breathing gets worse >> call if he need to start taking prednisone Follow up in 3 months  ---  I called spoke with pt. He was calling to let us know he is starting the prednisone. The ABX isn't really helping much/ FYI for Dr. Craige Cotta.

## 2015-08-14 NOTE — Telephone Encounter (Signed)
Noted  

## 2015-08-19 ENCOUNTER — Telehealth: Payer: Self-pay | Admitting: Pulmonary Disease

## 2015-08-19 NOTE — Telephone Encounter (Signed)
Pt coming to see TP tomorrow at 1145am. Nothing more needed.

## 2015-08-20 ENCOUNTER — Ambulatory Visit (INDEPENDENT_AMBULATORY_CARE_PROVIDER_SITE_OTHER)
Admission: RE | Admit: 2015-08-20 | Discharge: 2015-08-20 | Disposition: A | Discharge status: Home / Self Care | Payer: Medicare Other | Source: Ambulatory Visit | Attending: Adult Health | Admitting: Adult Health

## 2015-08-20 ENCOUNTER — Telehealth: Payer: Self-pay | Admitting: Adult Health

## 2015-08-20 ENCOUNTER — Ambulatory Visit (INDEPENDENT_AMBULATORY_CARE_PROVIDER_SITE_OTHER): Payer: Medicare Other | Admitting: Adult Health

## 2015-08-20 ENCOUNTER — Encounter: Payer: Self-pay | Admitting: Adult Health

## 2015-08-20 VITALS — BP 100/72 | HR 93 | Temp 98.3°F | Ht 64.0 in | Wt 240.0 lb

## 2015-08-20 DIAGNOSIS — J310 Chronic rhinitis: Secondary | ICD-10-CM | POA: Diagnosis not present

## 2015-08-20 DIAGNOSIS — J449 Chronic obstructive pulmonary disease, unspecified: Secondary | ICD-10-CM | POA: Diagnosis not present

## 2015-08-20 MED ORDER — LEVOFLOXACIN 500 MG PO TABS: 500.0000 mg | ORAL_TABLET | Freq: Every day | ORAL | Status: AC

## 2015-08-20 NOTE — Progress Notes (Signed)
   Subjective:    Patient ID: Hayzen Lorenson, male    DOB: [birth year], 64 y.o.   MRN: [id number]  HPI 64 yo former smoker with GOLD 3 COPD   08/20/15 Follow up  Pt seen 1 week ago for COPD exacerbation , tx w/ levaquin and steroid taper. Pt has been sick for 2-3 weeks . Initially seen by PCP with zpack . Felt some better but persistent symptoms . He is some better but still has lingering cough and wheezing . He has 2 days of prednisone left  Finished levaquin yesterday.  He denies chest pain, orthopnea, edema or feve.r  CXR today no acute changes     Tests: PFT 12/24/09>>FEV1 1.60(57%), FEV1% 52, TLC 6.86(130%), DLCO 67%, no BD PSG with 3 liters oxygen 06/12/12>>AHI 1.2, SpO2 low 90%, PLMI 1. Echo 09/27/12>>EF 55 to 60%, mild RA dilation  Review of Systems Constitutional:   No  weight loss, night sweats,  Fevers, chills,  +fatigue, or  lassitude.  HEENT:   No headaches,  Difficulty swallowing,  Tooth/dental problems, or  Sore throat,                No sneezing, itching, ear ache,  +nasal congestion, post nasal drip,   CV:  No chest pain,  Orthopnea, PND, swelling in lower extremities, anasarca, dizziness, palpitations, syncope.   GI  No heartburn, indigestion, abdominal pain, nausea, vomiting, diarrhea, change in bowel habits, loss of appetite, bloody stools.   Resp:   No chest wall deformity  Skin: no rash or lesions.  GU: no dysuria, change in color of urine, no urgency or frequency.  No flank pain, no hematuria   MS:  No joint pain or swelling.  No decreased range of motion.  No back pain.  Psych:  No change in mood or affect. No depression or anxiety.  No memory loss.         Objective:   Physical Exam GEN: A/Ox3; pleasant , NAD, obese    HEENT:  [place]/AT,  EACs-clear, TMs-wnl, NOSE-clear drainage  THROAT-clear, no lesions, no postnasal drip or exudate noted. Mild max tenderness   NECK:  Supple w/ fair ROM; no JVD; normal carotid impulses w/o bruits; no thyromegaly or  nodules palpated; no lymphadenopathy.  RESP  Clear  P & A; w/o, wheezes/ rales/ or rhonchi.no accessory muscle use, no dullness to percussion  CARD:  RRR, no m/r/g  , no peripheral edema, pulses intact, no cyanosis or clubbing.  GI:   Soft & nt; nml bowel sounds; no organomegaly or masses detected.  Musco: Warm bil, no deformities or joint swelling noted.   Neuro: alert, no focal deficits noted.    Skin: Warm, no lesions or rashes         Assessment & Plan:

## 2015-08-20 NOTE — Telephone Encounter (Signed)
Returned pt's call Informed him TP has not resulted the cxr results  I explained to him that i would call once she has given the results and recs  Pt voiced understanding and had no further questions Nothing further needed.

## 2015-08-20 NOTE — Patient Instructions (Signed)
Chest xray today .  Extend Levaquin  daily for 5 days Mucinex DM Twice daily  As needed  Cough/congestion  Saline nasal rinses As needed  .  follow up Dr. Craige Cotta  As planned and As needed   Please contact office for sooner follow up if symptoms do not improve or worsen or seek emergency care

## 2015-08-21 ENCOUNTER — Telehealth: Payer: Self-pay | Admitting: Adult Health

## 2015-08-21 NOTE — Telephone Encounter (Signed)
CXR shows some old scaring and some emphysema change, but nothing that looks new or active.

## 2015-08-21 NOTE — Telephone Encounter (Signed)
Patient notified.  Nothing further needed. 

## 2015-08-21 NOTE — Progress Notes (Signed)
Quick Note:  Called and spoke with pt. Reviewed results and recs. Pt voiced understanding and had no further questions. ______ 

## 2015-08-21 NOTE — Telephone Encounter (Signed)
Spoke with pt. He is requesting to get his CXR results today. TP off (ORDERING PROVIDER). Please advise Dr. Maple Hudson thanks

## 2015-08-21 NOTE — Assessment & Plan Note (Signed)
Flare , add saline rinses

## 2015-08-21 NOTE — Assessment & Plan Note (Signed)
Slow to resolve flare with suspected sinus flare  Cxr with no acute process   Plan  Chest xray today .  Extend Levaquin  daily for 5 days Mucinex DM Twice daily  As needed  Cough/congestion  Saline nasal rinses As needed  .  follow up Dr. Craige Cotta  As planned and As needed   Please contact office for sooner follow up if symptoms do not improve or worsen or seek emergency care

## 2015-09-05 ENCOUNTER — Telehealth: Payer: Self-pay | Admitting: Pulmonary Disease

## 2015-09-05 MED ORDER — LEVOFLOXACIN 500 MG PO TABS: 500.0000 mg | ORAL_TABLET | Freq: Every day | ORAL | Status: DC

## 2015-09-05 NOTE — Telephone Encounter (Signed)
Called made pt aware of below. Nothing further needed 

## 2015-09-05 NOTE — Telephone Encounter (Signed)
Can call in levaquin 500 mg daily, dispense 7 pills with no refills.

## 2015-09-05 NOTE — Telephone Encounter (Signed)
lmomtcb x1 for pt RX for levaquin sent to [facility]

## 2015-09-05 NOTE — Telephone Encounter (Signed)
Pt returned call

## 2015-09-05 NOTE — Telephone Encounter (Signed)
Per 08/20/15 OV w/ TP: Patient Instructions       Chest xray today .   Extend Levaquin  daily for 5 days Mucinex DM Twice daily  As needed  Cough/congestion   Saline nasal rinses As needed  .   follow up Dr. Craige Cotta  As planned and As needed    Please contact office for sooner follow up if symptoms do not improve or worsen or seek emergency care   ---  Called spoke with pt. C/o nasal cong, HA, chest cong, prod cough (creamy-clear colored phlem) x Monday. Reports his son was sick and reason he got sick. Pt wants ABX called in.  Please advise Dr. Craige Cotta thanks

## 2015-10-09 ENCOUNTER — Other Ambulatory Visit: Payer: Self-pay | Admitting: Pulmonary Disease

## 2015-10-09 ENCOUNTER — Telehealth: Payer: Self-pay | Admitting: Pulmonary Disease

## 2015-10-09 MED ORDER — ALBUTEROL SULFATE (2.5 MG/3ML) 0.083% IN NEBU: INHALATION_SOLUTION | RESPIRATORY_TRACT | Status: DC

## 2015-10-09 NOTE — Telephone Encounter (Signed)
Spoke with the pt and verified the msg  Rx for albuterol neb sol refilled  Nothing further needed

## 2015-10-15 ENCOUNTER — Ambulatory Visit: Payer: Medicare Other

## 2015-10-21 ENCOUNTER — Ambulatory Visit: Payer: Medicare Other | Admitting: Internal Medicine

## 2015-10-22 ENCOUNTER — Encounter: Payer: Self-pay | Admitting: Pulmonary Disease

## 2015-10-22 ENCOUNTER — Ambulatory Visit (INDEPENDENT_AMBULATORY_CARE_PROVIDER_SITE_OTHER): Payer: Medicare Other | Admitting: Pulmonary Disease

## 2015-10-22 VITALS — BP 144/76 | HR 105 | Ht 64.0 in | Wt 242.0 lb

## 2015-10-22 DIAGNOSIS — J449 Chronic obstructive pulmonary disease, unspecified: Secondary | ICD-10-CM

## 2015-10-22 DIAGNOSIS — Z23 Encounter for immunization: Secondary | ICD-10-CM | POA: Diagnosis not present

## 2015-10-22 DIAGNOSIS — J45909 Unspecified asthma, uncomplicated: Secondary | ICD-10-CM

## 2015-10-22 MED ORDER — PREDNISONE 10 MG PO TABS
ORAL_TABLET | ORAL | Status: DC
Start: 2015-10-22 — End: 2015-12-24

## 2015-10-22 NOTE — Progress Notes (Signed)
Subjective:    Patient ID: [name], male    DOB: 1951-04-22, 64 y.o.   MRN: [id number]  HPI Chief Complaint  Patient presents with  . Acute Visit    VS pt c/o increased DOE, chest tightness X2 days. denies chest congestion.     [name] says that his brother died recently and he had to help clean out his house on 10/20.  He started feeling more short of breath at the time.  He also had to clean out some storage buildings and encountered a lot of dust.  Since then he has been coughing more, feeling more chest tightness, some mild pain, and increasing dyspnea.  He feels mild pressure and tightness of his upper chest.  He is not producing more mucus, usually clear to yellow.  He is compliant with his medications including inhalers.  NO fever or chills.      Past Medical History  Diagnosis Date  . COPD (chronic obstructive pulmonary disease) (HCC)   . Chronic respiratory failure with hypoxia (HCC)   . Anxiety   . Dyspnea   . Hypothyroidism   . Hypertension   . Hyperlipidemia   . Osteoarthritis   . BPH (benign prostatic hyperplasia)       Review of Systems     Objective:   Physical Exam Filed Vitals:   10/22/15 1046  BP: 144/76  Pulse: 105  Height: 5\' 4"  (1.626 m)  Weight: 242 lb (109.77 kg)  SpO2: 93%   3L [place]  Gen: chronically ill appearing but no acute distress HENT: OP clear, TM's clear, neck supple PULM: CTA B, normal percussion CV: RRR, no mgr, trace edema GI: BS+, soft, nontender Derm: no cyanosis or rash Psyche: normal mood and affect    08/2015 CXR images reviewed> mild linear scarring bases, otherwise normal     Assessment & Plan:  COPD with asthma He is having a mild exacerbation of his COPD. I believe this is due to dust exposure recently.  Plan: Prednisone taper, instructed to call us if he does not have improvement in the next 2-3 days Flu shot today      Current outpatient prescriptions:  .  albuterol (PROAIR HFA) 108 (90 BASE)  MCG/ACT inhaler, INHALE 2 PUFFS INTO LUNGS EVERY 4 HOURS AS NEEDED, Disp: 3 Inhaler, Rfl: 3 .  albuterol (PROVENTIL) (2.5 MG/3ML) 0.083% nebulizer solution, USE 1 VIAL IN NEBULIZER EVERY 4 HOURS AS NEEDED FOR WHEEZING/SHORTNESS OF BREATH, Disp: 525 mL, Rfl: 2 .  atorvastatin (LIPITOR) 10 MG tablet, TAKE 0.5 TABLETS (5 MG TOTAL) BY MOUTH DAILY., Disp: 90 tablet, Rfl: 2 .  budesonide-formoterol (SYMBICORT) 160-4.5 MCG/ACT inhaler, INHALE 2 PUFFS INTO LUNGS TWICE DAILY, Disp: 3 Inhaler, Rfl: 3 .  clindamycin (CLEOCIN) 150 MG capsule, 4 tabs po x 1 prior to dentist visits, Disp: , Rfl:  .  clotrimazole-betamethasone (LOTRISONE) cream, APPLY TWICE A DAY FOR 10 TO 14 DAYS AS DIRECTED, Disp: 45 g, Rfl: 1 .  DULoxetine (CYMBALTA) 60 MG capsule, Take 1 capsule by mouth daily., Disp: , Rfl: 0 .  furosemide (LASIX) 40 MG tablet, TAKE TWO TABLETS BY MOUTH TWICE DAILY, Disp: 360 tablet, Rfl: 3 .  Guaifenesin (MUCINEX MAXIMUM STRENGTH) 1200 MG TB12, Take 1 tablet by mouth daily. , Disp: , Rfl:  .  HYDROcodone-acetaminophen (NORCO) 10-325 MG per tablet, Take 1 tablet by mouth every 6 (six) hours as needed. , Disp: , Rfl:  .  KLOR-CON M20 20 MEQ tablet, TAKE ONE TABLET BY MOUTH  DAILY, Disp: 90 tablet, Rfl: 3 .  levothyroxine (SYNTHROID, LEVOTHROID) 200 MCG tablet, TAKE 1 TABLET (200 MCG TOTAL) BY MOUTH DAILY., Disp: 90 tablet, Rfl: 3 .  loratadine (CLARITIN) 10 MG tablet, Take 10 mg by mouth daily.  , Disp: , Rfl:  .  LORazepam (ATIVAN) 0.5 MG tablet, Take 1 tablet (0.5 mg total) by mouth every 6 (six) hours as needed for anxiety., Disp: 30 tablet, Rfl: 1 .  Misc. Devices (ACAPELLA) MISC, Dx 496, Disp: 1 each, Rfl: 0 .  montelukast (SINGULAIR) 10 MG tablet, Take 1 tablet (10 mg total) by mouth at bedtime., Disp: 90 tablet, Rfl: 3 .  ranitidine (ZANTAC) 150 MG tablet, Take 150 mg by mouth at bedtime.  , Disp: , Rfl:  .  Spacer/Aero-Holding Chambers (AEROCHAMBER Z-STAT PLUS) inhaler, Use as instructed, Disp: 1 each,  Rfl: 0 .  sucralfate (CARAFATE) 1 G tablet, TAKE 1 TABLET FOUR TIMES A DAY, Disp: 360 tablet, Rfl: 2 .  tamsulosin (FLOMAX) 0.4 MG CAPS capsule, TAKE 1 CAPSULE (0.4 MG TOTAL) BY MOUTH DAILY., Disp: 90 capsule, Rfl: 3 .  tiotropium (SPIRIVA HANDIHALER) 18 MCG inhalation capsule, Place 1 capsule (18 mcg total) into inhaler and inhale daily., Disp: 90 capsule, Rfl: 3 .  tiZANidine (ZANAFLEX) 4 MG capsule, Take 4 mg by mouth 2 (two) times daily., Disp: , Rfl:  .  valsartan (DIOVAN) 160 MG tablet, TAKE 1 TABLET (160 MG TOTAL) BY MOUTH DAILY., Disp: 90 tablet, Rfl: 3 .  predniSONE (DELTASONE) 10 MG tablet, Take  po daily for 3 days, then take  po daily for 3 days, then take  po daily for two days, then take  po daily for 2 days, Disp: 27 tablet, Rfl: 0 .  triamcinolone (NASACORT) 55 MCG/ACT nasal inhaler, Place 2 sprays into the nose 2 (two) times daily., Disp: 1 Inhaler, Rfl: 5

## 2015-10-22 NOTE — Patient Instructions (Signed)
Take the prednisone taper as prescribed If you do not have improvement in your symptoms in the next 2-3 days please let us know Keep your appointment with Dr. Craige CottaSood as previously scheduled

## 2015-10-22 NOTE — Assessment & Plan Note (Signed)
He is having a mild exacerbation of his COPD. I believe this is due to dust exposure recently.  Plan: Prednisone taper, instructed to call us if he does not have improvement in the next 2-3 days Flu shot today

## 2015-10-23 NOTE — Progress Notes (Signed)
Reviewed and agree with assessment/plan. 

## 2015-11-19 ENCOUNTER — Telehealth: Payer: Self-pay | Admitting: Pulmonary Disease

## 2015-11-19 NOTE — Telephone Encounter (Signed)
Called spoke with pt. Aware 1 sample left for pick up. Nothing further needed 

## 2015-11-20 ENCOUNTER — Other Ambulatory Visit: Payer: Self-pay | Admitting: Pulmonary Disease

## 2015-11-25 ENCOUNTER — Other Ambulatory Visit: Payer: Self-pay | Admitting: Pulmonary Disease

## 2015-12-04 ENCOUNTER — Telehealth: Payer: Self-pay | Admitting: Pulmonary Disease

## 2015-12-04 ENCOUNTER — Other Ambulatory Visit: Payer: Self-pay | Admitting: Pulmonary Disease

## 2015-12-04 MED ORDER — TIOTROPIUM BROMIDE MONOHYDRATE 18 MCG IN CAPS: ORAL_CAPSULE | RESPIRATORY_TRACT | Status: DC

## 2015-12-04 NOTE — Telephone Encounter (Signed)
Spoke with pt. He would like a 90 day supply of Spiriva sent in. This has been taken care of. Has upcoming appointment next month. Nothing further was needed.

## 2015-12-24 ENCOUNTER — Encounter: Payer: Self-pay | Admitting: Adult Health

## 2015-12-24 ENCOUNTER — Ambulatory Visit (INDEPENDENT_AMBULATORY_CARE_PROVIDER_SITE_OTHER): Payer: Medicare Other | Admitting: Adult Health

## 2015-12-24 VITALS — BP 126/76 | HR 89 | Temp 98.6°F | Ht 64.0 in | Wt 239.0 lb

## 2015-12-24 DIAGNOSIS — J9611 Chronic respiratory failure with hypoxia: Secondary | ICD-10-CM | POA: Diagnosis not present

## 2015-12-24 DIAGNOSIS — J449 Chronic obstructive pulmonary disease, unspecified: Secondary | ICD-10-CM | POA: Diagnosis not present

## 2015-12-24 DIAGNOSIS — J45909 Unspecified asthma, uncomplicated: Secondary | ICD-10-CM | POA: Diagnosis not present

## 2015-12-24 NOTE — Assessment & Plan Note (Signed)
Compensated without flare   Plan  Continue on current regimen .  Follow up Dr. Craige CottaSood  In 6 months and As needed

## 2015-12-24 NOTE — Progress Notes (Signed)
Subjective:    Patient ID: [name], male    DOB: 24-Nov-1951, 65 y.o.   MRN: [id number]  HPI 65 yo former smoker with GOLD 3 COPD    12/24/2015 Follow up : COPD   Pt returns for follow up for COPD  Does have some intermittent  chest tightness, prod cough with milky to clear mucus, sinus pressure/drainage, wheezing at times. Denies any chest congestion, fever, nausea or vomiting.  Remains on Spiriva and Symbicort.  CXR in 08/2015 with COPD changes .  On O2 at 3l/m . [place] works well.  Vaccines utd.  Denies chest pain, orthopnea, edema or hemoptysis.  Working on wt loss. Down 12 lbs or more.       Immunization History  Administered Date(s) Administered  . Influenza Split 10/25/2011  . Influenza Whole 09/10/2009, 09/04/2010, 09/12/2012  . Influenza,inj,Quad PF,36+ Mos 10/17/2013, 09/25/2014, 10/22/2015  . Pneumococcal Conjugate-13 01/28/2015  . Pneumococcal Polysaccharide-23 12/13/2006    Tests: PFT 12/24/09>>FEV1 1.60(57%), FEV1% 52, TLC 6.86(130%), DLCO 67%, no BD PSG with 3 liters oxygen 06/12/12>>AHI 1.2, SpO2 low 90%, PLMI 1. Echo 09/27/12>>EF 55 to 60%, mild RA dilation  Past Medical History  Diagnosis Date  . COPD (chronic obstructive pulmonary disease) (HCC)   . Chronic respiratory failure with hypoxia (HCC)   . Anxiety   . Dyspnea   . Hypothyroidism   . Hypertension   . Hyperlipidemia   . Osteoarthritis   . BPH (benign prostatic hyperplasia)    Current Outpatient Prescriptions on File Prior to Visit  Medication Sig Dispense Refill  . albuterol (PROVENTIL) (2.5 MG/3ML) 0.083% nebulizer solution USE 1 VIAL IN NEBULIZER EVERY 4 HOURS AS NEEDED FOR WHEEZING/SHORTNESS OF BREATH 525 mL 2  . atorvastatin (LIPITOR) 10 MG tablet TAKE 0.5 TABLETS (5 MG TOTAL) BY MOUTH DAILY. 90 tablet 2  . clindamycin (CLEOCIN) 150 MG capsule 4 tabs po x 1 prior to dentist visits    . clotrimazole-betamethasone (LOTRISONE) cream APPLY TWICE A DAY FOR 10 TO 14 DAYS AS  DIRECTED 45 g 1  . DULoxetine (CYMBALTA) 60 MG capsule Take 1 capsule by mouth daily.  0  . furosemide (LASIX) 40 MG tablet TAKE TWO TABLETS BY MOUTH TWICE DAILY 360 tablet 3  . Guaifenesin (MUCINEX MAXIMUM STRENGTH) 1200 MG TB12 Take 1 tablet by mouth daily.     Marland Kitchen HYDROcodone-acetaminophen (NORCO) 10-325 MG per tablet Take 1 tablet by mouth every 6 (six) hours as needed.     Marland Kitchen KLOR-CON M20 20 MEQ tablet TAKE ONE TABLET BY MOUTH DAILY 90 tablet 3  . levothyroxine (SYNTHROID, LEVOTHROID) 200 MCG tablet TAKE 1 TABLET (200 MCG TOTAL) BY MOUTH DAILY. 90 tablet 3  . loratadine (CLARITIN) 10 MG tablet Take 10 mg by mouth daily.      Marland Kitchen LORazepam (ATIVAN) 0.5 MG tablet Take 1 tablet (0.5 mg total) by mouth every 6 (six) hours as needed for anxiety. 30 tablet 1  . Misc. Devices (ACAPELLA) MISC Dx 496 1 each 0  . montelukast (SINGULAIR) 10 MG tablet Take 1 tablet (10 mg total) by mouth at bedtime. 90 tablet 3  . PROAIR HFA 108 (90 BASE) MCG/ACT inhaler INHALE 2 PUFFS INTO LUNGS EVERY 4 HOURS AS NEEDED 25.5 Inhaler 3  . ranitidine (ZANTAC) 150 MG tablet Take 150 mg by mouth at bedtime.      Marland Kitchen Spacer/Aero-Holding Chambers (AEROCHAMBER Z-STAT PLUS) inhaler Use as instructed 1 each 0  . sucralfate (CARAFATE) 1 G tablet TAKE 1 TABLET FOUR TIMES  A DAY 360 tablet 2  . SYMBICORT 160-4.5 MCG/ACT inhaler INHALE 2 PUFFS INTO LUNGS TWICE DAILY 30.6 Inhaler 3  . tamsulosin (FLOMAX) 0.4 MG CAPS capsule TAKE 1 CAPSULE (0.4 MG TOTAL) BY MOUTH DAILY. 90 capsule 3  . tiotropium (SPIRIVA HANDIHALER) 18 MCG inhalation capsule PLACE 1 CAPSULE (18 MCG TOTAL) INTO INHALER AND INHALE DAILY. 90 capsule 1  . tiZANidine (ZANAFLEX) 4 MG capsule Take 4 mg by mouth 2 (two) times daily.    Marland Kitchen. triamcinolone (NASACORT) 55 MCG/ACT nasal inhaler Place 2 sprays into the nose 2 (two) times daily. 1 Inhaler 5  . valsartan (DIOVAN) 160 MG tablet TAKE 1 TABLET (160 MG TOTAL) BY MOUTH DAILY. 90 tablet 3   No current facility-administered  medications on file prior to visit.    Review of Systems Constitutional:   No  weight loss, night sweats,  Fevers, chills,  +fatigue, or  lassitude.  HEENT:   No headaches,  Difficulty swallowing,  Tooth/dental problems, or  Sore throat,                No sneezing, itching, ear ache,  +nasal congestion, post nasal drip,   CV:  No chest pain,  Orthopnea, PND, swelling in lower extremities, anasarca, dizziness, palpitations, syncope.   GI  No heartburn, indigestion, abdominal pain, nausea, vomiting, diarrhea, change in bowel habits, loss of appetite, bloody stools.   Resp:   No chest wall deformity  Skin: no rash or lesions.  GU: no dysuria, change in color of urine, no urgency or frequency.  No flank pain, no hematuria   MS:  No joint pain or swelling.  No decreased range of motion.  No back pain.  Psych:  No change in mood or affect. No depression or anxiety.  No memory loss.         Objective:   Physical Exam   Filed Vitals:   12/24/15 1127  BP: 126/76  Pulse: 89  Temp: 98.6 F (37 C)  TempSrc: Oral  Height: 5\' 4"  (1.626 m)  Weight: 239 lb (108.41 kg)  SpO2: 93%   Body mass index is 41 kg/(m^2).   GEN: A/Ox3; pleasant , NAD, obese    HEENT:  [place]/AT,  EACs-clear, TMs-wnl, NOSE-clear drainage  THROAT-clear, no lesions, no postnasal drip or exudate noted. Poor dentition  NECK:  Supple w/ fair ROM; no JVD; normal carotid impulses w/o bruits; no thyromegaly or nodules palpated; no lymphadenopathy.  RESP  Decreased BS in bases , .no accessory muscle use, no dullness to percussion  CARD:  RRR, no m/r/g  , tr  peripheral edema, pulses intact, no cyanosis or clubbing.  GI:   Soft & nt; nml bowel sounds; no organomegaly or masses detected.  Musco: Warm bil, no deformities or joint swelling noted.   Neuro: alert, no focal deficits noted.    Skin: Warm, no lesions or rashes         Assessment & Plan:

## 2015-12-24 NOTE — Assessment & Plan Note (Signed)
Continue on O2 at 3l/m

## 2015-12-24 NOTE — Patient Instructions (Signed)
Continue on current regimen .  Follow up Dr. Craige CottaSood  In 6 months and As needed

## 2015-12-28 ENCOUNTER — Inpatient Hospital Stay (HOSPITAL_COMMUNITY)
Admission: EM | Admit: 2015-12-28 | Discharge: 2016-01-04 | DRG: 190 | Disposition: A | Discharge status: Home Health | Payer: Medicare Other | Attending: Internal Medicine | Admitting: Internal Medicine

## 2015-12-28 ENCOUNTER — Emergency Department (HOSPITAL_COMMUNITY): Payer: Medicare Other

## 2015-12-28 ENCOUNTER — Encounter (HOSPITAL_COMMUNITY): Payer: Self-pay | Admitting: Emergency Medicine

## 2015-12-28 DIAGNOSIS — Z800 Family history of malignant neoplasm of digestive organs: Secondary | ICD-10-CM | POA: Diagnosis not present

## 2015-12-28 DIAGNOSIS — N179 Acute kidney failure, unspecified: Secondary | ICD-10-CM | POA: Diagnosis present

## 2015-12-28 DIAGNOSIS — J189 Pneumonia, unspecified organism: Secondary | ICD-10-CM | POA: Diagnosis present

## 2015-12-28 DIAGNOSIS — H9190 Unspecified hearing loss, unspecified ear: Secondary | ICD-10-CM | POA: Diagnosis present

## 2015-12-28 DIAGNOSIS — E785 Hyperlipidemia, unspecified: Secondary | ICD-10-CM | POA: Diagnosis present

## 2015-12-28 DIAGNOSIS — I10 Essential (primary) hypertension: Secondary | ICD-10-CM

## 2015-12-28 DIAGNOSIS — Z87891 Personal history of nicotine dependence: Secondary | ICD-10-CM | POA: Diagnosis not present

## 2015-12-28 DIAGNOSIS — Z713 Dietary counseling and surveillance: Secondary | ICD-10-CM | POA: Diagnosis not present

## 2015-12-28 DIAGNOSIS — J45909 Unspecified asthma, uncomplicated: Secondary | ICD-10-CM | POA: Diagnosis present

## 2015-12-28 DIAGNOSIS — Z6841 Body Mass Index (BMI) 40.0 and over, adult: Secondary | ICD-10-CM | POA: Diagnosis not present

## 2015-12-28 DIAGNOSIS — I110 Hypertensive heart disease with heart failure: Secondary | ICD-10-CM | POA: Diagnosis present

## 2015-12-28 DIAGNOSIS — E039 Hypothyroidism, unspecified: Secondary | ICD-10-CM | POA: Diagnosis present

## 2015-12-28 DIAGNOSIS — J9611 Chronic respiratory failure with hypoxia: Secondary | ICD-10-CM | POA: Diagnosis not present

## 2015-12-28 DIAGNOSIS — J440 Chronic obstructive pulmonary disease with acute lower respiratory infection: Secondary | ICD-10-CM | POA: Diagnosis present

## 2015-12-28 DIAGNOSIS — R739 Hyperglycemia, unspecified: Secondary | ICD-10-CM | POA: Diagnosis present

## 2015-12-28 DIAGNOSIS — J9621 Acute and chronic respiratory failure with hypoxia: Secondary | ICD-10-CM | POA: Diagnosis present

## 2015-12-28 DIAGNOSIS — J449 Chronic obstructive pulmonary disease, unspecified: Secondary | ICD-10-CM | POA: Diagnosis present

## 2015-12-28 DIAGNOSIS — G8929 Other chronic pain: Secondary | ICD-10-CM | POA: Diagnosis present

## 2015-12-28 DIAGNOSIS — R0602 Shortness of breath: Secondary | ICD-10-CM

## 2015-12-28 DIAGNOSIS — F411 Generalized anxiety disorder: Secondary | ICD-10-CM | POA: Diagnosis present

## 2015-12-28 DIAGNOSIS — R0600 Dyspnea, unspecified: Secondary | ICD-10-CM

## 2015-12-28 DIAGNOSIS — Z9981 Dependence on supplemental oxygen: Secondary | ICD-10-CM

## 2015-12-28 DIAGNOSIS — J188 Other pneumonia, unspecified organism: Secondary | ICD-10-CM | POA: Diagnosis present

## 2015-12-28 DIAGNOSIS — I5033 Acute on chronic diastolic (congestive) heart failure: Secondary | ICD-10-CM | POA: Diagnosis present

## 2015-12-28 DIAGNOSIS — E669 Obesity, unspecified: Secondary | ICD-10-CM | POA: Diagnosis present

## 2015-12-28 DIAGNOSIS — M1990 Unspecified osteoarthritis, unspecified site: Secondary | ICD-10-CM | POA: Diagnosis present

## 2015-12-28 DIAGNOSIS — J441 Chronic obstructive pulmonary disease with (acute) exacerbation: Secondary | ICD-10-CM | POA: Diagnosis present

## 2015-12-28 DIAGNOSIS — J4489 Other specified chronic obstructive pulmonary disease: Secondary | ICD-10-CM | POA: Diagnosis present

## 2015-12-28 DIAGNOSIS — N400 Enlarged prostate without lower urinary tract symptoms: Secondary | ICD-10-CM | POA: Diagnosis present

## 2015-12-28 DIAGNOSIS — I509 Heart failure, unspecified: Secondary | ICD-10-CM | POA: Diagnosis not present

## 2015-12-28 DIAGNOSIS — M25519 Pain in unspecified shoulder: Secondary | ICD-10-CM | POA: Diagnosis present

## 2015-12-28 DIAGNOSIS — E038 Other specified hypothyroidism: Secondary | ICD-10-CM | POA: Diagnosis not present

## 2015-12-28 HISTORY — DX: Incisional hernia without obstruction or gangrene: K43.2

## 2015-12-28 LAB — COMPREHENSIVE METABOLIC PANEL
ALBUMIN: 2.8 g/dL — AB (ref 3.5–5.0)
ALT: 33 U/L (ref 17–63)
AST: 28 U/L (ref 15–41)
Alkaline Phosphatase: 98 U/L (ref 38–126)
Anion gap: 10 (ref 5–15)
BUN: 19 mg/dL (ref 6–20)
CHLORIDE: 97 mmol/L — AB (ref 101–111)
CO2: 29 mmol/L (ref 22–32)
CREATININE: 1.22 mg/dL (ref 0.61–1.24)
Calcium: 9.2 mg/dL (ref 8.9–10.3)
GFR calc Af Amer: >60 mL/min (ref >60)
GFR calc non Af Amer: >60 mL/min (ref >60)
GLUCOSE: 149 mg/dL — AB (ref 65–99)
Potassium: 4.1 mmol/L (ref 3.5–5.1)
SODIUM: 136 mmol/L (ref 135–145)
Total Bilirubin: 1.4 mg/dL — ABNORMAL HIGH (ref 0.3–1.2)
Total Protein: 6.8 g/dL (ref 6.5–8.1)

## 2015-12-28 LAB — CREATININE, SERUM
CREATININE: 0.99 mg/dL (ref 0.61–1.24)
GFR calc Af Amer: >60 mL/min (ref >60)

## 2015-12-28 LAB — I-STAT CG4 LACTIC ACID, ED: LACTIC ACID, VENOUS: 1.6 mmol/L (ref 0.5–2.0)

## 2015-12-28 LAB — I-STAT ARTERIAL BLOOD GAS, ED
Acid-Base Excess: 4 mmol/L — ABNORMAL HIGH (ref 0.0–2.0)
BICARBONATE: 30 meq/L — AB (ref 20.0–24.0)
O2 Saturation: 93 %
PCO2 ART: 48.5 mmHg — AB (ref 35.0–45.0)
PO2 ART: 68 mmHg — AB (ref 80.0–100.0)
TCO2: 31 mmol/L (ref 0–100)
pH, Arterial: 7.4 (ref 7.350–7.450)

## 2015-12-28 LAB — CBC
HCT: 36.9 % — ABNORMAL LOW (ref 39.0–52.0)
HEMATOCRIT: 34.6 % — AB (ref 39.0–52.0)
HEMOGLOBIN: 11.8 g/dL — AB (ref 13.0–17.0)
HEMOGLOBIN: 11 g/dL — AB (ref 13.0–17.0)
MCH: 30.4 pg (ref 26.0–34.0)
MCH: 30.4 pg (ref 26.0–34.0)
MCHC: 32 g/dL (ref 30.0–36.0)
MCHC: 31.8 g/dL (ref 30.0–36.0)
MCV: 95.1 fL (ref 78.0–100.0)
MCV: 95.6 fL (ref 78.0–100.0)
PLATELETS: 183 10*3/uL (ref 150–400)
Platelets: 176 10*3/uL (ref 150–400)
RBC: 3.88 MIL/uL — AB (ref 4.22–5.81)
RBC: 3.62 MIL/uL — AB (ref 4.22–5.81)
RDW: 14 % (ref 11.5–15.5)
RDW: 13.8 % (ref 11.5–15.5)
WBC: 11.9 10*3/uL — AB (ref 4.0–10.5)
WBC: 10.1 10*3/uL (ref 4.0–10.5)

## 2015-12-28 LAB — DIFFERENTIAL
BASOS ABS: 0 10*3/uL (ref 0.0–0.1)
BASOS PCT: 0 %
Eosinophils Absolute: 0.1 10*3/uL (ref 0.0–0.7)
Eosinophils Relative: 0 %
Lymphocytes Relative: 11 %
Lymphs Abs: 1.3 10*3/uL (ref 0.7–4.0)
MONOS PCT: 9 %
Monocytes Absolute: 1 10*3/uL (ref 0.1–1.0)
NEUTROS ABS: 8.8 10*3/uL — AB (ref 1.7–7.7)
Neutrophils Relative %: 79 %

## 2015-12-28 LAB — I-STAT TROPONIN, ED: Troponin i, poc: 0 ng/mL (ref 0.00–0.08)

## 2015-12-28 LAB — BRAIN NATRIURETIC PEPTIDE: B Natriuretic Peptide: 86.1 pg/mL (ref 0.0–100.0)

## 2015-12-28 LAB — TSH: TSH: 0.43 u[IU]/mL (ref 0.350–4.500)

## 2015-12-28 LAB — LACTIC ACID, PLASMA: LACTIC ACID, VENOUS: 1.7 mmol/L (ref 0.5–2.0)

## 2015-12-28 MED ORDER — DULOXETINE HCL 60 MG PO CPEP
60.0000 mg | ORAL_CAPSULE | Freq: Every day | ORAL | Status: DC
  Administered 2015-12-29 – 2016-01-04 (×7): 60 mg via ORAL
  Filled 2015-12-28 (×7): qty 1

## 2015-12-28 MED ORDER — FLUTICASONE PROPIONATE 50 MCG/ACT NA SUSP
2.0000 | Freq: Every day | NASAL | Status: DC
  Administered 2015-12-29 – 2016-01-04 (×7): 2 via NASAL
  Filled 2015-12-28 (×2): qty 16

## 2015-12-28 MED ORDER — GUAIFENESIN ER 1200 MG PO TB12: 1.0000 | ORAL_TABLET | Freq: Every day | ORAL | Status: DC

## 2015-12-28 MED ORDER — LEVOFLOXACIN IN D5W 750 MG/150ML IV SOLN
750.0000 mg | Freq: Once | INTRAVENOUS | Status: AC
  Administered 2015-12-28: 750 mg via INTRAVENOUS
  Filled 2015-12-28: qty 150

## 2015-12-28 MED ORDER — VANCOMYCIN HCL IN DEXTROSE 750-5 MG/150ML-% IV SOLN
750.0000 mg | Freq: Two times a day (BID) | INTRAVENOUS | Status: DC
  Filled 2015-12-28: qty 150

## 2015-12-28 MED ORDER — HYDROCODONE-ACETAMINOPHEN 10-325 MG PO TABS
1.0000 | ORAL_TABLET | Freq: Four times a day (QID) | ORAL | Status: DC | PRN
  Administered 2015-12-30 – 2016-01-03 (×9): 1 via ORAL
  Filled 2015-12-28 (×9): qty 1

## 2015-12-28 MED ORDER — SUCRALFATE 1 G PO TABS
1.0000 g | ORAL_TABLET | Freq: Four times a day (QID) | ORAL | Status: DC
  Administered 2015-12-28 – 2016-01-04 (×25): 1 g via ORAL
  Filled 2015-12-28 (×25): qty 1

## 2015-12-28 MED ORDER — GUAIFENESIN ER 600 MG PO TB12: 600.0000 mg | ORAL_TABLET | Freq: Two times a day (BID) | ORAL | Status: DC

## 2015-12-28 MED ORDER — ONDANSETRON HCL 4 MG PO TABS: 4.0000 mg | ORAL_TABLET | Freq: Four times a day (QID) | ORAL | Status: DC | PRN

## 2015-12-28 MED ORDER — LEVOTHYROXINE SODIUM 100 MCG PO TABS
200.0000 ug | ORAL_TABLET | Freq: Every day | ORAL | Status: DC
  Administered 2015-12-29 – 2016-01-04 (×7): 200 ug via ORAL
  Filled 2015-12-28: qty 2
  Filled 2015-12-28 (×3): qty 1
  Filled 2015-12-28 (×2): qty 2
  Filled 2015-12-28 (×3): qty 1
  Filled 2015-12-28 (×5): qty 2

## 2015-12-28 MED ORDER — SODIUM CHLORIDE 0.9 % IV BOLUS (SEPSIS)
1000.0000 mL | Freq: Once | INTRAVENOUS | Status: AC
  Administered 2015-12-28: 1000 mL via INTRAVENOUS

## 2015-12-28 MED ORDER — ALUM & MAG HYDROXIDE-SIMETH 200-200-20 MG/5ML PO SUSP: 30.0000 mL | Freq: Four times a day (QID) | ORAL | Status: DC | PRN

## 2015-12-28 MED ORDER — ENOXAPARIN SODIUM 40 MG/0.4ML ~~LOC~~ SOLN
40.0000 mg | SUBCUTANEOUS | Status: DC
  Administered 2015-12-28: 40 mg via SUBCUTANEOUS
  Filled 2015-12-28: qty 0.4

## 2015-12-28 MED ORDER — MONTELUKAST SODIUM 10 MG PO TABS
10.0000 mg | ORAL_TABLET | Freq: Every day | ORAL | Status: DC
  Administered 2015-12-28 – 2016-01-03 (×7): 10 mg via ORAL
  Filled 2015-12-28 (×7): qty 1

## 2015-12-28 MED ORDER — SENNOSIDES-DOCUSATE SODIUM 8.6-50 MG PO TABS: 1.0000 | ORAL_TABLET | Freq: Every evening | ORAL | Status: DC | PRN

## 2015-12-28 MED ORDER — FAMOTIDINE 20 MG PO TABS
10.0000 mg | ORAL_TABLET | Freq: Two times a day (BID) | ORAL | Status: DC
  Administered 2015-12-28 – 2015-12-29 (×3): 10 mg via ORAL
  Filled 2015-12-28 (×4): qty 1

## 2015-12-28 MED ORDER — DOCUSATE SODIUM 100 MG PO CAPS
100.0000 mg | ORAL_CAPSULE | Freq: Two times a day (BID) | ORAL | Status: DC
  Filled 2015-12-28 (×2): qty 1

## 2015-12-28 MED ORDER — VANCOMYCIN HCL IN DEXTROSE 1-5 GM/200ML-% IV SOLN
1000.0000 mg | Freq: Once | INTRAVENOUS | Status: AC
  Administered 2015-12-28: 1000 mg via INTRAVENOUS
  Filled 2015-12-28: qty 200

## 2015-12-28 MED ORDER — IPRATROPIUM-ALBUTEROL 0.5-2.5 (3) MG/3ML IN SOLN: 3.0000 mL | RESPIRATORY_TRACT | Status: DC | PRN

## 2015-12-28 MED ORDER — IPRATROPIUM-ALBUTEROL 0.5-2.5 (3) MG/3ML IN SOLN
3.0000 mL | RESPIRATORY_TRACT | Status: DC
  Administered 2015-12-28: 3 mL via RESPIRATORY_TRACT
  Filled 2015-12-28 (×2): qty 3

## 2015-12-28 MED ORDER — ATORVASTATIN CALCIUM 10 MG PO TABS
5.0000 mg | ORAL_TABLET | Freq: Every day | ORAL | Status: DC
  Administered 2015-12-29 – 2016-01-03 (×6): 5 mg via ORAL
  Filled 2015-12-28 (×6): qty 1

## 2015-12-28 MED ORDER — TAMSULOSIN HCL 0.4 MG PO CAPS
0.4000 mg | ORAL_CAPSULE | Freq: Every day | ORAL | Status: DC
  Administered 2015-12-28 – 2016-01-03 (×7): 0.4 mg via ORAL
  Filled 2015-12-28 (×7): qty 1

## 2015-12-28 MED ORDER — METHYLPREDNISOLONE SODIUM SUCC 125 MG IJ SOLR
125.0000 mg | Freq: Once | INTRAMUSCULAR | Status: AC
  Administered 2015-12-28: 125 mg via INTRAVENOUS
  Filled 2015-12-28: qty 2

## 2015-12-28 MED ORDER — TAMSULOSIN HCL 0.4 MG PO CAPS
0.4000 mg | ORAL_CAPSULE | Freq: Every day | ORAL | Status: DC
  Administered 2015-12-28: 0.4 mg via ORAL

## 2015-12-28 MED ORDER — IPRATROPIUM-ALBUTEROL 0.5-2.5 (3) MG/3ML IN SOLN
3.0000 mL | Freq: Four times a day (QID) | RESPIRATORY_TRACT | Status: DC
  Administered 2015-12-28: 3 mL via RESPIRATORY_TRACT
  Filled 2015-12-28: qty 3

## 2015-12-28 MED ORDER — BUDESONIDE-FORMOTEROL FUMARATE 160-4.5 MCG/ACT IN AERO
2.0000 | INHALATION_SPRAY | Freq: Two times a day (BID) | RESPIRATORY_TRACT | Status: DC
  Administered 2015-12-29: 2 via RESPIRATORY_TRACT
  Filled 2015-12-28: qty 6

## 2015-12-28 MED ORDER — PREDNISONE 50 MG PO TABS
60.0000 mg | ORAL_TABLET | Freq: Every day | ORAL | Status: DC
  Administered 2015-12-29: 60 mg via ORAL
  Filled 2015-12-28: qty 1

## 2015-12-28 MED ORDER — LORATADINE 10 MG PO TABS
10.0000 mg | ORAL_TABLET | Freq: Every day | ORAL | Status: DC
  Administered 2015-12-29 – 2016-01-04 (×7): 10 mg via ORAL
  Filled 2015-12-28 (×7): qty 1

## 2015-12-28 MED ORDER — LEVOFLOXACIN IN D5W 750 MG/150ML IV SOLN
750.0000 mg | INTRAVENOUS | Status: AC
  Administered 2015-12-29 – 2016-01-03 (×6): 750 mg via INTRAVENOUS
  Filled 2015-12-28 (×6): qty 150

## 2015-12-28 MED ORDER — SODIUM CHLORIDE 0.9 % IV SOLN
INTRAVENOUS | Status: DC
  Administered 2015-12-28: 20:00:00 via INTRAVENOUS

## 2015-12-28 MED ORDER — ONDANSETRON HCL 4 MG/2ML IJ SOLN: 4.0000 mg | Freq: Four times a day (QID) | INTRAMUSCULAR | Status: DC | PRN

## 2015-12-28 MED ORDER — ALBUTEROL (5 MG/ML) CONTINUOUS INHALATION SOLN
15.0000 mg/h | INHALATION_SOLUTION | Freq: Once | RESPIRATORY_TRACT | Status: AC
  Administered 2015-12-28: 15 mg/h via RESPIRATORY_TRACT
  Filled 2015-12-28: qty 20

## 2015-12-28 MED ORDER — IPRATROPIUM BROMIDE 0.02 % IN SOLN
0.5000 mg | Freq: Once | RESPIRATORY_TRACT | Status: AC
  Administered 2015-12-28: 0.5 mg via RESPIRATORY_TRACT
  Filled 2015-12-28: qty 2.5

## 2015-12-28 NOTE — Progress Notes (Signed)
RT Note:  Pt removed from face tent and placed on 6lpm [place] with humidity.  Pt sats are maintaining in lower 90's and per COPD standards.  Rt added extension to [place] for bathroom visits and pt seems to be comfortable.  Rt will continue to monitor.

## 2015-12-28 NOTE — Progress Notes (Signed)
ANTIBIOTIC CONSULT NOTE - INITIAL  Pharmacy Consult for levaquin/vanc Indication: rule out pneumonia  Allergies  Allergen Reactions  . Penicillins    Vital Signs: BP: 98/52 mmHg (01/15 1305) Pulse Rate: 105 (01/15 1305) Intake/Output from previous day:   Intake/Output from this shift:    Labs:  Recent Labs  12/28/15 1230  WBC 11.9*  HGB 11.8*  PLT 183  CREATININE 1.22   Estimated Creatinine Clearance: 69.2 mL/min (by C-G formula based on Cr of 1.22). No results for input(s): VANCOTROUGH, VANCOPEAK, VANCORANDOM, GENTTROUGH, GENTPEAK, GENTRANDOM, TOBRATROUGH, TOBRAPEAK, TOBRARND, AMIKACINPEAK, AMIKACINTROU, AMIKACIN in the last 72 hours.   Microbiology: No results found for this or any previous visit (from the past 720 hour(s)).  Medical History: Past Medical History  Diagnosis Date  . COPD (chronic obstructive pulmonary disease) (HCC)   . Chronic respiratory failure with hypoxia (HCC)   . Anxiety   . Dyspnea   . Hypothyroidism   . Hypertension   . Hyperlipidemia   . Osteoarthritis   . BPH (benign prostatic hyperplasia)     Assessment: 65 yo male here with to begin levaquin and vancomycin for possible PNA. -WBC= 11.0, SCr= 1.22 and CrCl ~ 70 -Levaquin anf vancomycin 1000mg  has been ordered in the ED  1/15 levaquin 1/15 vanco  1/15 blood x2  Goal of Therapy:  Vancomycin trough level 15-20 mcg/ml  Plan:  -Levaquin 750mg  IV q24hr -Vancomycin 1000mg  to complete a total load on 2000mg  followed by 750mg  IV q12h -Will follow renal function, cultures and clinical progress  Harland Germanndrew Andee Chivers, Pharm D 12/28/2015 1:45 PM

## 2015-12-28 NOTE — ED Notes (Signed)
Sent purple top tube to lab.

## 2015-12-28 NOTE — ED Notes (Addendum)
Pt to ER via [facility] from home with complaint of sharp, stabbing right axillary chest pain with dyspnea x1 week. Pt has hx of COPD. Reports 2 days of productive cough. Denies fever. Normally wears 3 L O2 nasal cannula at home. Pt O2 sats on arrival on 3L sats at 84%. A/O x4.

## 2015-12-28 NOTE — ED Notes (Signed)
RT notified of need for continuous neb 

## 2015-12-28 NOTE — ED Provider Notes (Signed)
CSN: [id number]     Arrival date & time 12/28/15  1215 History   First MD Initiated Contact with Patient 12/28/15 1227     Chief Complaint  Patient presents with  . Chest Pain     (Consider location/radiation/quality/duration/timing/severity/associated sxs/prior Treatment) HPI  65 year old male presents with increasing shortness of breath for the last 3 days. Chronic dyspnea from COPD that is on home oxygen. Patient has chronic cough with green sputum but states that his sputum seems different than normal now though is still green. He has had chills and no fevers. Short of breath has significantly worse. He is also having right-sided flank/chest pain that is only present if he is coughing or twisting. Patient has not been taking his albuterol for the last couple days because family states he has been too sleepy. Typically the albuterol helps with situations like this. No new leg swelling. Paler ports patient's color was gray prior to arrival. After being placed on oxygen he appears more alert and is color is more normal per family.  Past Medical History  Diagnosis Date  . COPD (chronic obstructive pulmonary disease) (HCC)   . Chronic respiratory failure with hypoxia (HCC)   . Anxiety   . Dyspnea   . Hypothyroidism   . Hypertension   . Hyperlipidemia   . Osteoarthritis   . BPH (benign prostatic hyperplasia)    History reviewed. No pertinent past surgical history. History reviewed. No pertinent family history. Social History  Substance Use Topics  . Smoking status: Former Smoker -- 40 years    Types: Cigarettes    Quit date: 12/13/2010  . Smokeless tobacco: Never Used     Comment: 3-4 cigs a day  . Alcohol Use: None    Review of Systems  Constitutional: Positive for chills. Negative for fever.  Respiratory: Positive for cough and shortness of breath.   Cardiovascular: Positive for chest pain. Negative for leg swelling.  Genitourinary: Negative for dysuria.       Dark urine    All other systems reviewed and are negative.     Allergies  Penicillins  Home Medications   Prior to Admission medications   Medication Sig Start Date End Date Taking? Authorizing Provider  albuterol (PROVENTIL) (2.5 MG/3ML) 0.083% nebulizer solution USE 1 VIAL IN NEBULIZER EVERY 4 HOURS AS NEEDED FOR WHEEZING/SHORTNESS OF BREATH 10/09/15   Coralyn Helling, MD  atorvastatin (LIPITOR) 10 MG tablet TAKE 0.5 TABLETS (5 MG TOTAL) BY MOUTH DAILY. 03/31/15   Donita Brooks, MD  clindamycin (CLEOCIN) 150 MG capsule 4 tabs po x 1 prior to dentist visits    Historical Provider, MD  clotrimazole-betamethasone (LOTRISONE) cream APPLY TWICE A DAY FOR 10 TO 14 DAYS AS DIRECTED 10/31/14   Donita Brooks, MD  DULoxetine (CYMBALTA) 60 MG capsule Take 1 capsule by mouth daily. 05/14/15   Historical Provider, MD  furosemide (LASIX) 40 MG tablet TAKE TWO TABLETS BY MOUTH TWICE DAILY 03/05/15   Donita Brooks, MD  Guaifenesin [facility] MAXIMUM STRENGTH) 1200 MG TB12 Take 1 tablet by mouth daily.     Historical Provider, MD  HYDROcodone-acetaminophen (NORCO) 10-325 MG per tablet Take 1 tablet by mouth every 6 (six) hours as needed.     Historical Provider, MD  KLOR-CON M20 20 MEQ tablet TAKE ONE TABLET BY MOUTH DAILY 01/28/15   Donita Brooks, MD  levothyroxine (SYNTHROID, LEVOTHROID) 200 MCG tablet TAKE 1 TABLET (200 MCG TOTAL) BY MOUTH DAILY. 02/28/15   Donita Brooks, MD  loratadine (CLARITIN) 10 MG tablet Take 10 mg by mouth daily.      Historical Provider, MD  LORazepam (ATIVAN) 0.5 MG tablet Take 1 tablet (0.5 mg total) by mouth every 6 (six) hours as needed for anxiety. 06/10/15   Coralyn HellingVineet Sood, MD  Misc. Devices (ACAPELLA) MISC Dx 496 11/05/14   Coralyn HellingVineet Sood, MD  montelukast (SINGULAIR) 10 MG tablet Take 1 tablet (10 mg total) by mouth at bedtime. 03/05/15 03/04/16  Donita BrooksWarren T Pickard, MD  PROAIR HFA 108 (90 BASE) MCG/ACT inhaler INHALE 2 PUFFS INTO LUNGS EVERY 4 HOURS AS NEEDED 11/21/15   Coralyn HellingVineet Sood, MD   ranitidine (ZANTAC) 150 MG tablet Take 150 mg by mouth at bedtime.      Historical Provider, MD  Spacer/Aero-Holding Chambers (AEROCHAMBER Z-STAT PLUS) inhaler Use as instructed 05/18/13   Coralyn HellingVineet Sood, MD  sucralfate (CARAFATE) 1 G tablet TAKE 1 TABLET FOUR TIMES A DAY 05/05/15   Donita BrooksWarren T Pickard, MD  SYMBICORT 160-4.5 MCG/ACT inhaler INHALE 2 PUFFS INTO LUNGS TWICE DAILY 11/25/15   Coralyn HellingVineet Sood, MD  tamsulosin (FLOMAX) 0.4 MG CAPS capsule TAKE 1 CAPSULE (0.4 MG TOTAL) BY MOUTH DAILY. 03/05/15   Donita BrooksWarren T Pickard, MD  tiotropium (SPIRIVA HANDIHALER) 18 MCG inhalation capsule PLACE 1 CAPSULE (18 MCG TOTAL) INTO INHALER AND INHALE DAILY. 12/04/15   Coralyn HellingVineet Sood, MD  tiZANidine (ZANAFLEX) 4 MG capsule Take 4 mg by mouth 2 (two) times daily.    Historical Provider, MD  triamcinolone (NASACORT) 55 MCG/ACT nasal inhaler Place 2 sprays into the nose 2 (two) times daily. 04/06/13 12/24/15  Coralyn HellingVineet Sood, MD  valsartan (DIOVAN) 160 MG tablet TAKE 1 TABLET (160 MG TOTAL) BY MOUTH DAILY. 03/05/15   Donita BrooksWarren T Pickard, MD   There were no vitals taken for this visit. Physical Exam  Constitutional: He is oriented to person, place, and time. He appears well-developed and well-nourished.  HENT:  Head: Normocephalic and atraumatic.  Right Ear: External ear normal.  Left Ear: External ear normal.  Nose: Nose normal.  Eyes: Right eye exhibits no discharge. Left eye exhibits no discharge.  Neck: Neck supple.  Cardiovascular: Regular rhythm, normal heart sounds and intact distal pulses.  Tachycardia present.   Pulmonary/Chest: Accessory muscle usage present. Tachypnea noted. He has decreased breath sounds (diffuse).  Abdominal: Soft. There is no tenderness. A hernia is present. Hernia confirmed positive in the ventral area.    Musculoskeletal: He exhibits no edema.  Neurological: He is alert and oriented to person, place, and time.  Skin: Skin is warm and dry.  Nursing note and vitals reviewed.   ED Course   Procedures (including critical care time) Labs Review Labs Reviewed  CBC - Abnormal; Notable for the following:    WBC 11.9 (*)    RBC 3.88 (*)    Hemoglobin 11.8 (*)    HCT 36.9 (*)    All other components within normal limits  COMPREHENSIVE METABOLIC PANEL - Abnormal; Notable for the following:    Chloride 97 (*)    Glucose, Bld 149 (*)    Albumin 2.8 (*)    Total Bilirubin 1.4 (*)    All other components within normal limits  DIFFERENTIAL - Abnormal; Notable for the following:    Neutro Abs 8.8 (*)    All other components within normal limits  I-STAT ARTERIAL BLOOD GAS, ED - Abnormal; Notable for the following:    pCO2 arterial 48.5 (*)    pO2, Arterial 68.0 (*)    Bicarbonate 30.0 (*)  Acid-Base Excess 4.0 (*)    All other components within normal limits  CULTURE, BLOOD (ROUTINE X 2)  CULTURE, BLOOD (ROUTINE X 2)  BRAIN NATRIURETIC PEPTIDE  I-STAT TROPOININ, ED  I-STAT CG4 LACTIC ACID, ED    Imaging Review Dg Chest Port 1 View  12/28/2015  CLINICAL DATA:  Short of breath EXAM: PORTABLE CHEST 1 VIEW COMPARISON:  08/20/2015 FINDINGS: Patchy bilateral lower lobe airspace opacities left greater than right. Upper lungs clear. Normal heart size. IMPRESSION: Bibasilar patchy pneumonia. Followup PA and lateral chest X-ray is recommended in 3-4 weeks following trial of antibiotic therapy to ensure resolution and exclude underlying malignancy. Electronically Signed   By: Jolaine Click M.D.   On: 12/28/2015 13:31   I have personally reviewed and evaluated these images and lab results as part of my medical decision-making.   EKG Interpretation   Date/Time:  Sunday December 28 2015 12:26:30 EST Ventricular Rate:  118 PR Interval:  145 QRS Duration: 86 QT Interval:  290 QTC Calculation: 406 R Axis:   43 Text Interpretation:  Sinus tachycardia Probable left atrial enlargement  rate is faster compared to last EKG in 2012 Confirmed by Adynn Caseres  MD,  Lanya Bucks ([id number]) on 12/28/2015  12:37:48 PM      MDM   Final diagnoses:  Bilateral pneumonia  Acute on chronic respiratory failure with hypoxia (HCC)    Patient with significant increased work of breathing upon arrival. His breathing and respiratory rate improved significantly after hour long continuous albuterol treatment. He was also given Solu-Medrol. X-ray is concerning for bilateral pneumonia, and thus he was given IV antibiotics. He has not been admitted to the hospital recently and does not have risk factors for HCAP. Given that his breathing has improved, I do not think he needs BiPAP or intubation. He will be admitted to the stepdown unit for continued close respiratory support and monitoring. Admit to the hospitalist.    Pricilla Loveless, MD 12/28/15 [phone]

## 2015-12-28 NOTE — ED Notes (Signed)
Respiratory at bedside.
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
## 2015-12-28 NOTE — ED Notes (Signed)
Pt family at the bedside reporting patient has had labored breathing x 2 days with altered mental status and per family a "gray" color. Pt family also reports patient has been very lethargic at home, falling asleep while eating. Pt on arrival is a/o x4, following commands appropriately. Pt O2 increased to 6L and O2 sats up to 89%. Pt placed on NRB and sats at 94%. Pt continues to have labored breathing.

## 2015-12-29 ENCOUNTER — Telehealth: Payer: Self-pay | Admitting: Pulmonary Disease

## 2015-12-29 ENCOUNTER — Encounter (HOSPITAL_COMMUNITY): Payer: Self-pay | Admitting: *Deleted

## 2015-12-29 DIAGNOSIS — J449 Chronic obstructive pulmonary disease, unspecified: Secondary | ICD-10-CM

## 2015-12-29 LAB — CBC
HEMATOCRIT: 34 % — AB (ref 39.0–52.0)
Hemoglobin: 10.7 g/dL — ABNORMAL LOW (ref 13.0–17.0)
MCH: 30.1 pg (ref 26.0–34.0)
MCHC: 31.5 g/dL (ref 30.0–36.0)
MCV: 95.8 fL (ref 78.0–100.0)
PLATELETS: 178 10*3/uL (ref 150–400)
RBC: 3.55 MIL/uL — AB (ref 4.22–5.81)
RDW: 13.8 % (ref 11.5–15.5)
WBC: 8.4 10*3/uL (ref 4.0–10.5)

## 2015-12-29 LAB — BASIC METABOLIC PANEL
Anion gap: 9 (ref 5–15)
BUN: 16 mg/dL (ref 6–20)
CHLORIDE: 103 mmol/L (ref 101–111)
CO2: 28 mmol/L (ref 22–32)
CREATININE: 0.89 mg/dL (ref 0.61–1.24)
Calcium: 9.4 mg/dL (ref 8.9–10.3)
GFR calc non Af Amer: >60 mL/min (ref >60)
Glucose, Bld: 226 mg/dL — ABNORMAL HIGH (ref 65–99)
POTASSIUM: 4.4 mmol/L (ref 3.5–5.1)
SODIUM: 140 mmol/L (ref 135–145)

## 2015-12-29 LAB — GLUCOSE, CAPILLARY
GLUCOSE-CAPILLARY: 184 mg/dL — AB (ref 65–99)
GLUCOSE-CAPILLARY: 220 mg/dL — AB (ref 65–99)

## 2015-12-29 LAB — LACTIC ACID, PLASMA: Lactic Acid, Venous: 2.7 mmol/L (ref 0.5–2.0)

## 2015-12-29 LAB — MRSA PCR SCREENING: MRSA by PCR: NEGATIVE

## 2015-12-29 MED ORDER — INSULIN ASPART 100 UNIT/ML ~~LOC~~ SOLN
0.0000 [IU] | Freq: Three times a day (TID) | SUBCUTANEOUS | Status: DC
Start: 2015-12-29 — End: 2015-12-30
  Administered 2015-12-29: 2 [IU] via SUBCUTANEOUS
  Administered 2015-12-30 (×2): 3 [IU] via SUBCUTANEOUS

## 2015-12-29 MED ORDER — ALBUTEROL SULFATE (2.5 MG/3ML) 0.083% IN NEBU
2.5000 mg | INHALATION_SOLUTION | RESPIRATORY_TRACT | Status: DC | PRN
  Administered 2015-12-29 – 2016-01-01 (×8): 2.5 mg via RESPIRATORY_TRACT
  Filled 2015-12-29 (×8): qty 3

## 2015-12-29 MED ORDER — PSYLLIUM 95 % PO PACK
1.0000 | PACK | Freq: Every day | ORAL | Status: DC
  Administered 2015-12-29 – 2016-01-04 (×7): 1 via ORAL
  Filled 2015-12-29 (×7): qty 1

## 2015-12-29 MED ORDER — BUDESONIDE 0.25 MG/2ML IN SUSP
0.2500 mg | Freq: Two times a day (BID) | RESPIRATORY_TRACT | Status: AC
  Administered 2015-12-29 – 2016-01-03 (×11): 0.25 mg via RESPIRATORY_TRACT
  Filled 2015-12-29 (×12): qty 2

## 2015-12-29 MED ORDER — SODIUM CHLORIDE 0.9 % IV BOLUS (SEPSIS)
500.0000 mL | Freq: Once | INTRAVENOUS | Status: AC
  Administered 2015-12-29: 500 mL via INTRAVENOUS

## 2015-12-29 MED ORDER — TIZANIDINE HCL 4 MG PO CAPS: 4.0000 mg | ORAL_CAPSULE | Freq: Two times a day (BID) | ORAL | Status: DC

## 2015-12-29 MED ORDER — GUAIFENESIN ER 600 MG PO TB12
1200.0000 mg | ORAL_TABLET | Freq: Two times a day (BID) | ORAL | Status: DC
  Administered 2015-12-29 – 2016-01-04 (×13): 1200 mg via ORAL
  Filled 2015-12-29 (×14): qty 2

## 2015-12-29 MED ORDER — IPRATROPIUM-ALBUTEROL 0.5-2.5 (3) MG/3ML IN SOLN
3.0000 mL | Freq: Four times a day (QID) | RESPIRATORY_TRACT | Status: DC
  Administered 2015-12-29: 3 mL via RESPIRATORY_TRACT

## 2015-12-29 MED ORDER — METHYLPREDNISOLONE SODIUM SUCC 125 MG IJ SOLR
60.0000 mg | Freq: Two times a day (BID) | INTRAMUSCULAR | Status: AC
Start: 2015-12-29 — End: 2016-01-02
  Administered 2015-12-29 – 2016-01-02 (×9): 60 mg via INTRAVENOUS
  Filled 2015-12-29 (×9): qty 2

## 2015-12-29 MED ORDER — IPRATROPIUM-ALBUTEROL 0.5-2.5 (3) MG/3ML IN SOLN
3.0000 mL | Freq: Four times a day (QID) | RESPIRATORY_TRACT | Status: DC
  Administered 2015-12-29 – 2016-01-04 (×21): 3 mL via RESPIRATORY_TRACT
  Filled 2015-12-29 (×22): qty 3

## 2015-12-29 MED ORDER — IPRATROPIUM-ALBUTEROL 0.5-2.5 (3) MG/3ML IN SOLN
3.0000 mL | RESPIRATORY_TRACT | Status: DC
  Administered 2015-12-29 (×2): 3 mL via RESPIRATORY_TRACT
  Filled 2015-12-29 (×2): qty 3

## 2015-12-29 MED ORDER — TIZANIDINE HCL 4 MG PO TABS
4.0000 mg | ORAL_TABLET | Freq: Two times a day (BID) | ORAL | Status: DC
  Administered 2015-12-29 – 2016-01-04 (×14): 4 mg via ORAL
  Filled 2015-12-29 (×16): qty 1

## 2015-12-29 MED ORDER — SENNOSIDES-DOCUSATE SODIUM 8.6-50 MG PO TABS: 1.0000 | ORAL_TABLET | Freq: Two times a day (BID) | ORAL | Status: DC | PRN

## 2015-12-29 MED ORDER — INSULIN ASPART 100 UNIT/ML ~~LOC~~ SOLN
0.0000 [IU] | Freq: Every day | SUBCUTANEOUS | Status: DC
  Administered 2015-12-29: 2 [IU] via SUBCUTANEOUS

## 2015-12-29 MED ORDER — ENOXAPARIN SODIUM 40 MG/0.4ML ~~LOC~~ SOLN
40.0000 mg | SUBCUTANEOUS | Status: DC
  Administered 2015-12-29 – 2015-12-30 (×2): 40 mg via SUBCUTANEOUS
  Filled 2015-12-29 (×2): qty 0.4

## 2015-12-29 NOTE — Care Management Note (Signed)
Case Management Note  Patient Details  Name: Samuel DakinMichael [name] MRN: [id number] Date of Birth: [birth year]  Subjective/Objective:  Adm w pneumonia                  Action/Plan: lives w wife, pcp dr Broadus Johnwarren pickard, has home o2  Expected Discharge Date:                 Expected Discharge Plan:     In-House Referral:     Discharge planning Services     Post Acute Care Choice:    Choice offered to:     DME Arranged:    DME Agency:     HH Arranged:    HH Agency:     Status of Service:     Medicare Important Message Given:    Date Medicare IM Given:    Medicare IM give by:    Date Additional Medicare IM Given:    Additional Medicare Important Message give by:     If discussed at Long Length of Stay Meetings, dates discussed:    Additional Comments: ur review done  Hanley HaysDowell, Jhony Antrim T, RN 12/29/2015, 9:47 AM

## 2015-12-29 NOTE — Progress Notes (Signed)
CRITICAL VALUE ALERT  Critical value received:  LA 2.7  Date of notification:  12-29-15  Time of notification:  0010  Critical value read back:Yes.    Nurse who received alert:  A.Demesha Boorman,RN   MD notified (1st page):  [place],PA  Time of first page:  0045  MD notified (2nd page):  Time of second page:  Responding MD:  Georgia DomHarduk, PA  Time MD responded:  [phone]

## 2015-12-29 NOTE — Clinical Social Work Note (Signed)
CSW received referral for SNF.  Case discussed with case manager, and plan is to discharge home.  CSW to sign off please re-consult if social work needs arise.  Faten Frieson R. Rmani Kapusta, MSW, LCSWA [phone]  

## 2015-12-29 NOTE — Progress Notes (Signed)
Inpatient Diabetes Program Recommendations  AACE/ADA: New Consensus Statement on Inpatient Glycemic Control (2015)  Target Ranges:  Prepandial:   less than 140 mg/dL      Peak postprandial:   less than 180 mg/dL (1-2 hours)      Critically ill patients:  140 - 180 mg/dL   Review of Glycemic Control  Inpatient Diabetes Program Recommendations:    Please order CBGs achs and consider starting Novolog sensitive scale TID + HS scale if needed. Thank you  Piedad ClimesGina Weiland Tomich BSN, RN,CDE Inpatient Diabetes Coordinator [phone] (team pager)

## 2015-12-29 NOTE — Progress Notes (Signed)
Nutrition Brief Note  RD consulted for assessment of nutrition needs/status, per COPD GOLD Protocol.   Wt Readings from Last 15 Encounters:  12/28/15 239 lb (108.41 kg)  12/24/15 239 lb (108.41 kg)  10/22/15 242 lb (109.77 kg)  08/20/15 240 lb (108.863 kg)  08/12/15 244 lb (110.678 kg)  08/04/15 240 lb (108.863 kg)  06/10/15 243 lb 12.8 oz (110.587 kg)  01/28/15 250 lb (113.399 kg)  01/14/15 248 lb (112.492 kg)  11/05/14 257 lb (116.574 kg)  04/17/14 243 lb 12.8 oz (110.587 kg)  02/26/14 247 lb (112.038 kg)  10/17/13 250 lb 3.2 oz (113.49 kg)  08/21/13 249 lb (112.946 kg)  04/06/13 245 lb (111.131 kg)   Mr. [name] is a 65yo man with PMH of severe COPD on home oxygen, anxiety, hypothyroidism, HTN, HLD, OA, BPH, chronic shoulder pain who presents for worsening SOB, wheezing, increased cough with change in sputum and increased sleepiness per his wife. Mr. [name] reports that he was exposed to a family member about 1 week ago who was recovering from pneumonia.   Pt admitted with bilateral pneumonia.   Spoke with pt and family at bedside. He reports poor appetite 3 days PTA, due to breathing difficulty. Prior to this he had a great appetite, consuming 3 meals per day. Pt revealed that he has been actively trying to lose weight, by selecting healthier foods choices and being mindful of portion sizes. He has also been trying to walk with his family to help facilitate weight loss.   Pt reveals that his appetite has improved since being in the hospital. Currently consuming lunch at time of visit. Nutrition-Focused physical exam completed. Findings are no fat depletion, no muscle depletion, and mild edema.   Pt and family denied any further nutrition needs or questions, but expressed appreciation for the visit.   Body mass index is 41 kg/(m^2). Patient meets criteria for extreme obesity based on current BMI.   Current diet order is Heart Healthy, patient is consuming approximately 100% of meals  at this time. Labs and medications reviewed.   No nutrition interventions warranted at this time. If nutrition issues arise, please consult RD.   Keimora Swartout A. Mayford KnifeWilliams, RD, LDN, CDE Pager: [phone] After hours Pager: [phone]

## 2015-12-29 NOTE — Telephone Encounter (Signed)
Spoke with pt's daughter, requesting we send a doctor from our office to go see pt in hospital.  I advised that the rounding nurse in the hospital can help them arrange a pulmonary consult with one of our pulmonary providers.  Pt's daughter expressed understanding.  Nothing further needed.

## 2015-12-29 NOTE — Progress Notes (Signed)
[facility] TEAM 1 - Stepdown/ICU TEAM PROGRESS NOTE  [name] ZOX:[id number] DOB: [birth year] DOA: 12/28/2015 PCP: Samuel Grosser, MD  Admit HPI / Brief Narrative: 65yo man with hx of severe COPD on home oxygen, anxiety, hypothyroidism, HTN, HLD, OA, BPH, and chronic shoulder pain who presented w/ worsening SOB, wheezing, and increased cough with change in sputum and increased sleepiness per his wife. Further symptoms included pleuritic chest pain on the right.  In the ED, he was awake and alert, ABG showed CO2 was mildly elevated, but O2 was low. He improved greatly with a continuous albuterol nebulizer but continued to have elevated oxygen requirements and needed face tent oxygen. He did not require BIPAP.   HPI/Subjective: The patient states he feels slightly better but nowhere near his baseline.  He complains of constipation in addition to ongoing shortness of breath and intermittent right basilar pleuritic type chest pain.  He denies substernal chest pressure nausea vomiting or diarrhea.  Assessment/Plan:  Bibasliar pneumonia Continue empiric antibiotic therapy  Acute on Chronic respiratory failure with hypoxia due to COPD  Continue usual medical treatment for acute COPD exacerbation with supportive oxygen as well  AKI crt has returned to normal w/ gently hydration   Hypothyroidism Continue home medical therapy  Essential hypertension Blood pressure currently well controlled  Anxiety state Well compensated at present  Code Status: FULL Family Communication: Spoke with patient, his wife, and his daughter at bedside at length Disposition Plan: SDU  Consultants: None   Procedures: None  Antibiotics: Levaquin 1/15 > Vancomycin 1/15  DVT prophylaxis: Lovenox  Objective: Blood pressure 126/73, pulse 89, temperature 97.8 F (36.6 C), temperature source Oral, resp. rate 28, height 5\' 4"  (1.626 m), weight 108.41 kg (239 lb), SpO2 91 %.  Intake/Output  Summary (Last 24 hours) at 12/29/15 1338 Last data filed at 12/29/15 1300  Gross per 24 hour  Intake   1240 ml  Output   1550 ml  Net   -310 ml   Exam: General: No severe distress but pausing incidences to breathe Lungs: Poor air movement throughout - mildly for wheezing - bibasilar crackles Cardiovascular: Regular rate and rhythm without murmur gallop or rub normal S1 and S2 Abdomen: Nontender, nondistended, soft, bowel sounds positive, no rebound, no ascites, no appreciable mass Extremities: No significant cyanosis, or clubbing;  trace edema bilateral lower extremities  Data Reviewed:  Basic Metabolic Panel:  Recent Labs Lab 12/28/15 1230 12/28/15 1942 12/29/15 0225  NA 136  --  140  K 4.1  --  4.4  CL 97*  --  103  CO2 29  --  28  GLUCOSE 149*  --  226*  BUN 19  --  16  CREATININE 1.22 0.99 0.89  CALCIUM 9.2  --  9.4    CBC:  Recent Labs Lab 12/28/15 1230 12/28/15 1324 12/28/15 1942 12/29/15 0225  WBC 11.9*  --  10.1 8.4  NEUTROABS  --  8.8*  --   --   HGB 11.8*  --  11.0* 10.7*  HCT 36.9*  --  34.6* 34.0*  MCV 95.1  --  95.6 95.8  PLT 183  --  176 178    Liver Function Tests:  Recent Labs Lab 12/28/15 1230  AST 28  ALT 33  ALKPHOS 98  BILITOT 1.4*  PROT 6.8  ALBUMIN 2.8*     Recent Results (from the past 240 hour(s))  Blood culture (routine x 2)     Status: None (Preliminary result)   Collection  Time: 12/28/15  2:05 PM  Result Value Ref Range Status   Specimen Description BLOOD BLOOD RIGHT FOREARM  Final   Special Requests BOTTLES DRAWN AEROBIC AND ANAEROBIC 5CC  Final   Culture NO GROWTH < 24 HOURS  Final   Report Status PENDING  Incomplete  Blood culture (routine x 2)     Status: None (Preliminary result)   Collection Time: 12/28/15  2:20 PM  Result Value Ref Range Status   Specimen Description BLOOD LEFT HAND  Final   Special Requests BOTTLES DRAWN AEROBIC AND ANAEROBIC 5CC  Final   Culture NO GROWTH < 24 HOURS  Final   Report Status  PENDING  Incomplete  MRSA PCR Screening     Status: None   Collection Time: 12/28/15 10:00 PM  Result Value Ref Range Status   MRSA by PCR NEGATIVE NEGATIVE Final    Comment:        The GeneXpert MRSA Assay (FDA approved for NASAL specimens only), is one component of a comprehensive MRSA colonization surveillance program. It is not intended to diagnose MRSA infection nor to guide or monitor treatment for MRSA infections.      Studies:   Recent x-ray studies have been reviewed in detail by the Attending Physician  Scheduled Meds:  Scheduled Meds: . atorvastatin  5 mg Oral q1800  . budesonide-formoterol  2 puff Inhalation BID  . docusate sodium  100 mg Oral BID  . DULoxetine  60 mg Oral Daily  . enoxaparin (LOVENOX) injection  40 mg Subcutaneous Q24H  . famotidine  10 mg Oral BID  . fluticasone  2 spray Each Nare Daily  . guaiFENesin  1,200 mg Oral BID  . ipratropium-albuterol  3 mL Nebulization Q4H  . levofloxacin (LEVAQUIN) IV  750 mg Intravenous Q24H  . levothyroxine  200 mcg Oral QAC breakfast  . loratadine  10 mg Oral Daily  . montelukast  10 mg Oral QHS  . predniSONE  60 mg Oral Q breakfast  . sucralfate  1 g Oral QID  . tamsulosin  0.4 mg Oral QPC supper  . tiZANidine  4 mg Oral BID    Time spent on care of this patient: 35 mins   [name] T , MD   [facility] Office  [phone] Pager - Text Page per Loretha StaplerAmion as per below:  On-Call/Text Page:      [email or web address]      password [email or web address]  If 7PM-7AM, please contact night-coverage [email or web address] Password [email or web address] 12/29/2015, 1:38 PM   LOS: 1 day

## 2015-12-29 NOTE — Evaluation (Signed)
Physical Therapy Evaluation Patient Details Name: Samuel DakinMichael [name] MRN: [id number] DOB: Feb 19, 1951 Today's Date: 12/29/2015   History of Present Illness  64yo man with hx of severe COPD on home oxygen, anxiety, hypothyroidism, HTN, HLD, OA, BPH, and chronic shoulder pain who presented w/ worsening SOB, wheezing, and increased cough with change in sputum and increased sleepiness per his wife. Further symptoms included pleuritic chest pain on the right.  Clinical Impression  Pt admitted with above diagnosis. Pt currently with functional limitations due to the deficits listed below (see PT Problem List). Pt with very limited endurance for activity needing incr rest breaks and incr assist for steadying. Has good family support.  Hopeful that pt can go home with family assist.  Will follow acutely.   Pt will benefit from skilled PT to increase their independence and safety with mobility to allow discharge to the venue listed below.      Follow Up Recommendations Home health PT;Supervision/Assistance - 24 hour    Equipment Recommendations  Wheelchair (measurements PT);Wheelchair cushion (measurements PT);Hospital bed (may need wheelchair for community/hosp bed for pulm issues)    Recommendations for Other Services       Precautions / Restrictions Precautions Precautions: Fall Restrictions Weight Bearing Restrictions: No      Mobility  Bed Mobility Overal bed mobility: Needs Assistance Bed Mobility: Supine to Sit     Supine to sit: Min guard     General bed mobility comments: incr time with pt needing to take rest breaks due to SOB.  Transfers Overall transfer level: Needs assistance Equipment used: 2 person hand held assist Transfers: Sit to/from Stand Sit to Stand: Min assist         General transfer comment: Able to stand with steadying assist and march in place but had to sit down due to DOE 4/4.Needed rest breaks after each movement.    Ambulation/Gait              General Gait Details: unable due to SOB.  Stairs            Wheelchair Mobility    Modified Rankin (Stroke Patients Only)       Balance Overall balance assessment: Needs assistance;History of Falls Sitting-balance support: No upper extremity supported;Feet supported Sitting balance-Leahy Scale: Fair   Postural control: Posterior lean Standing balance support: Bilateral upper extremity supported;During functional activity Standing balance-Leahy Scale: Poor Standing balance comment: requiring UE support by PT to stand statically with slight posterior lean.                              Pertinent Vitals/Pain Pain Assessment: No/denies pain  HR 90-120 bpm, 92% on 6LO2 on arrival, 24-32. At EOB 85-88% with each movement on 6LO2 with rest breaks of 3 min to incr to >91%. Standing EOB 85-88% on 6LO2 with another rest break to incr sats.  Pt even desats to 88% with talking to PT.     Home Living Family/patient expects to be discharged to:: Private residence Living Arrangements: Spouse/significant other;Children Available Help at Discharge: Family;Available 24 hours/day Type of Home: House Home Access: Stairs to enter   Enter[employer] CorporationEntrance Stairs-Number of Steps: 1 Home Layout: Two level;Able to live on main level with bedroom/bathroom Home Equipment: Bedside commode;Shower seat - built in;Walker - 4 wheels;Cane - single point (cant use built in seat, pt thinks he has 3N1 and walker) Additional Comments: Was using 3LO2 at home for last 5-6 years.  Prior Function Level of Independence: Independent         Comments: Sleeps in recliner per pt     Hand Dominance        Extremity/Trunk Assessment   Upper Extremity Assessment: Defer to OT evaluation           Lower Extremity Assessment: Generalized weakness      Cervical / Trunk Assessment: Kyphotic  Communication   Communication: No difficulties  Cognition Arousal/Alertness: Awake/alert Behavior During  Therapy: Flat affect Overall Cognitive Status: Within Functional Limits for tasks assessed                      General Comments General comments (skin integrity, edema, etc.): Pt would desat to 88-91% on 6LO2 with just talking to PT.  Desat to 85-88% on 6LO2 with any movement whether coming to EOB or standing at EOB.      Exercises        Assessment/Plan    PT Assessment Patient needs continued PT services  PT Diagnosis Generalized weakness   PT Problem List Decreased activity tolerance;Decreased balance;Decreased mobility;Decreased knowledge of use of DME;Decreased safety awareness;Decreased knowledge of precautions;Decreased strength;Cardiopulmonary status limiting activity;Decreased coordination  PT Treatment Interventions DME instruction;Gait training;Functional mobility training;Therapeutic activities;Therapeutic exercise;Balance training;Patient/family education   PT Goals (Current goals can be found in the Care Plan section) Acute Rehab PT Goals Patient Stated Goal: to get better PT Goal Formulation: With patient Time For Goal Achievement: 01/12/16 Potential to Achieve Goals: Fair    Frequency Min 3X/week   Barriers to discharge        Co-evaluation               End of Session Equipment Utilized During Treatment: Gait belt;Oxygen Activity Tolerance: Patient limited by fatigue Patient left: in bed;with call bell/phone within reach Nurse Communication: Mobility status         Time: 1610-9604 PT Time Calculation (min) (ACUTE ONLY): 19 min   Charges:   PT Evaluation $PT Eval Moderate Complexity: 1 Procedure     PT G CodesBerline Lopes 01-24-2016, 4:39 PM [facility] Acute Rehabilitation [phone] (pager)

## 2015-12-30 ENCOUNTER — Inpatient Hospital Stay (HOSPITAL_COMMUNITY): Payer: Medicare Other

## 2015-12-30 DIAGNOSIS — J441 Chronic obstructive pulmonary disease with (acute) exacerbation: Secondary | ICD-10-CM | POA: Diagnosis present

## 2015-12-30 DIAGNOSIS — E038 Other specified hypothyroidism: Secondary | ICD-10-CM

## 2015-12-30 DIAGNOSIS — R739 Hyperglycemia, unspecified: Secondary | ICD-10-CM | POA: Diagnosis present

## 2015-12-30 DIAGNOSIS — J9611 Chronic respiratory failure with hypoxia: Secondary | ICD-10-CM

## 2015-12-30 LAB — CBC
HCT: 35.5 % — ABNORMAL LOW (ref 39.0–52.0)
Hemoglobin: 11.4 g/dL — ABNORMAL LOW (ref 13.0–17.0)
MCH: 30.5 pg (ref 26.0–34.0)
MCHC: 32.1 g/dL (ref 30.0–36.0)
MCV: 94.9 fL (ref 78.0–100.0)
PLATELETS: 196 10*3/uL (ref 150–400)
RBC: 3.74 MIL/uL — AB (ref 4.22–5.81)
RDW: 13.8 % (ref 11.5–15.5)
WBC: 10.2 10*3/uL (ref 4.0–10.5)

## 2015-12-30 LAB — GLUCOSE, CAPILLARY
GLUCOSE-CAPILLARY: 209 mg/dL — AB (ref 65–99)
Glucose-Capillary: 203 mg/dL — ABNORMAL HIGH (ref 65–99)
Glucose-Capillary: 188 mg/dL — ABNORMAL HIGH (ref 65–99)

## 2015-12-30 LAB — LACTIC ACID, PLASMA
LACTIC ACID, VENOUS: 2.4 mmol/L — AB (ref 0.5–2.0)
Lactic Acid, Venous: 2.4 mmol/L (ref 0.5–2.0)

## 2015-12-30 LAB — BASIC METABOLIC PANEL
Anion gap: 11 (ref 5–15)
BUN: 15 mg/dL (ref 6–20)
CALCIUM: 9.6 mg/dL (ref 8.9–10.3)
CHLORIDE: 102 mmol/L (ref 101–111)
CO2: 26 mmol/L (ref 22–32)
CREATININE: 0.72 mg/dL (ref 0.61–1.24)
Glucose, Bld: 178 mg/dL — ABNORMAL HIGH (ref 65–99)
Potassium: 4.1 mmol/L (ref 3.5–5.1)
SODIUM: 139 mmol/L (ref 135–145)

## 2015-12-30 LAB — INFLUENZA PANEL BY PCR (TYPE A & B)
H1N1FLUPCR: NOT DETECTED
INFLBPCR: NEGATIVE
Influenza A By PCR: NEGATIVE

## 2015-12-30 LAB — STREP PNEUMONIAE URINARY ANTIGEN: STREP PNEUMO URINARY ANTIGEN: NEGATIVE

## 2015-12-30 LAB — TROPONIN I: TROPONIN I: 0.05 ng/mL — AB (ref <0.031)

## 2015-12-30 MED ORDER — INSULIN ASPART 100 UNIT/ML ~~LOC~~ SOLN
0.0000 [IU] | Freq: Three times a day (TID) | SUBCUTANEOUS | Status: DC
  Administered 2015-12-31: 3 [IU] via SUBCUTANEOUS
  Administered 2015-12-31 – 2016-01-01 (×4): 2 [IU] via SUBCUTANEOUS
  Administered 2016-01-01: 3 [IU] via SUBCUTANEOUS
  Administered 2016-01-02 (×3): 2 [IU] via SUBCUTANEOUS

## 2015-12-30 MED ORDER — LORAZEPAM 0.5 MG PO TABS
0.5000 mg | ORAL_TABLET | Freq: Four times a day (QID) | ORAL | Status: DC | PRN
  Administered 2016-01-02 – 2016-01-03 (×4): 0.5 mg via ORAL
  Filled 2015-12-30 (×4): qty 1

## 2015-12-30 MED ORDER — SODIUM CHLORIDE 0.9 % IV SOLN
INTRAVENOUS | Status: DC
  Administered 2015-12-30: 17:00:00 via INTRAVENOUS

## 2015-12-30 MED ORDER — INSULIN ASPART 100 UNIT/ML ~~LOC~~ SOLN
0.0000 [IU] | SUBCUTANEOUS | Status: DC
Start: 2015-12-30 — End: 2015-12-30
  Administered 2015-12-30: 3 [IU] via SUBCUTANEOUS
  Administered 2015-12-30: 5 [IU] via SUBCUTANEOUS

## 2015-12-30 MED ORDER — FAMOTIDINE 20 MG PO TABS
10.0000 mg | ORAL_TABLET | Freq: Every day | ORAL | Status: DC
  Administered 2015-12-30 – 2016-01-03 (×5): 10 mg via ORAL
  Filled 2015-12-30 (×5): qty 1

## 2015-12-30 NOTE — Progress Notes (Signed)
md notified for Clarification q4 or [facility] cbg's Alethia Berthold

## 2015-12-30 NOTE — Progress Notes (Signed)
[facility] TEAM 1 - Stepdown/ICU TEAM Progress Note  Mark Hassey NFA:[id number] DOB: [birth year] DOA: 12/28/2015 PCP: Leo Grosser, MD  Admit HPI / Brief Narrative: [name] is a 64yo WM PMHx Anxiety, Severe COPD on 3 L O2 home Hypothyroidism, HTN, HLD, OA, BPH, Chronic Shoulder Pain   Presents for worsening SOB, wheezing, increased cough with change in sputum and increased sleepiness per his wife. [name] reports that he was exposed to a family member about 1 week ago who was recovering from pneumonia. On Wednesday, he went to see his pulmonologist and was doing okay, but after that time he began to feel worse. He developed a worsening cough with dark green, bad tasting sputum that was more watery than normal for him. He also noted increased SOB despite his home oxygen dose. Further symptoms included pleuritic chest pain on the right and increased sleeping with falling asleep while trying to eat. He also noticed some dizziness upon standing which is new. He denies any fever or chills or substernal chest pain. He has been taking his inhalers and medications as prescribed. He does report swelling in his legs, but this is chronic and he wears supportive stockings to help with this.   In the ED, he was awake and alert, ABG showed CO2 was mildly elevated, but O2 was low. He improved greatly with a continuous albuterol nebulizer but continued to have elevated oxygen requirements and needs face tent oxygen. He did not require BIPAP in the short term.    HPI/Subjective: 1/17 A/O 4, positive purulent sputum (green). States uses 3 L O2 at home 24 hours a day.     Assessment/Plan: Bibasliar pneumonia/CAP -Continue empiric antibiotic therapy -Pulmicort nebulizer BID -DuoNeb QID -Sodium Medrol 60 mg BID -Singulair 10 mg daily -Flutter valve -Physiotherapy vest BID -Mucinex BID -Echocardiogram Pending  Acute on Chronic respiratory failure with hypoxia due to COPD  Exacerbation -Continue usual medical treatment for acute COPD exacerbation with supportive oxygen as well  Lactic acidosis -Trending down, continue normal saline 14ml/hr; DC'd ~1915 secondary to patient feeling increasing shortness of breath. -PCXR showed actual improvement of airspace disease, negative pulmonary edema.  CHF? -Echocardiogram pending -Strict in and out -Daily weight  Essential hypertension -Blood pressure currently well controlled  AKI -Cr has returned to normal w/ gently hydration   Hypothyroidism -Continue levothyroxine 200 g daily  Anxiety state -Continue Ativan 0.5 mg q 6hr PRN  -Cymbalta 50 mg daily  Hyperglycemia/diabetes? -HgA1C pending  -Moderate SSI    Code Status: FULL Family Communication: Wife and daughter present at time of exam Disposition Plan: Resolution sepsis    Consultants: NA  Procedure/Significant Events: Echocardiogram pending   Culture 1/15 blood right forearm/Left hand NGTD 1/15 MRSA by PCR negative 1/17 influenza pending 1/17 respiratory virus pending 1/17 sputum pending 1/17 strep pneumo urine antigen/Legionella urine antigen pending  Antibiotics: Levaquin 1/15 > Vancomycin 1/15  DVT prophylaxis: Lovenox   Devices    LINES / TUBES:      Continuous Infusions:   Objective: VITAL SIGNS: Temp: 98.4 F (36.9 C) (01/17 1819) Temp Source: Oral (01/17 1819) BP: 133/82 mmHg (01/17 1819) Pulse Rate: 90 (01/17 1700) SPO2; FIO2:   Intake/Output Summary (Last 24 hours) at 12/30/15 1948 Last data filed at 12/30/15 0500  Gross per 24 hour  Intake      0 ml  Output    900 ml  Net   -900 ml     Exam: General: Positive  acute on Chronic respiratory distress  Eyes: Negative headache, double vision,negative scleral hemorrhage ENT: Negative Runny nose, negative gingival bleeding Neck:  Negative scars, masses, torticollis, lymphadenopathy, JVD Lungs: Tachypnea Decreased Breath sounds LLL, Diffuse  wheezes Rt Lung fields, negative crackles Cardiovascular: Regular rate and rhythm without murmur gallop or rub normal S1 and S2 Abdomen:negative abdominal pain, nondistended, RLQ Hernia (reducable),  positive soft, bowel sounds, no rebound, no ascites, no appreciable mass Extremities: No significant cyanosis, clubbing, Positive bilateral lower extremities Edema1-2+  Lt>>Rt Psychiatric:  Negative depression, negative anxiety, negative fatigue, negative mania  Neurologic:  Cranial nerves II through XII intact, tongue/uvula midline, all extremities muscle strength 5/5, sensation intact throughout,  negative dysarthria, negative expressive aphasia, negative receptive aphasia.   Data Reviewed: Basic Metabolic Panel:  Recent Labs Lab 12/28/15 1230 12/28/15 1942 12/29/15 0225 12/30/15 0245  NA 136  --  140 139  K 4.1  --  4.4 4.1  CL 97*  --  103 102  CO2 29  --  28 26  GLUCOSE 149*  --  226* 178*  BUN 19  --  16 15  CREATININE 1.22 0.99 0.89 0.72  CALCIUM 9.2  --  9.4 9.6   Liver Function Tests:  Recent Labs Lab 12/28/15 1230  AST 28  ALT 33  ALKPHOS 98  BILITOT 1.4*  PROT 6.8  ALBUMIN 2.8*   No results for input(s): LIPASE, AMYLASE in the last 168 hours. No results for input(s): AMMONIA in the last 168 hours. CBC:  Recent Labs Lab 12/28/15 1230 12/28/15 1324 12/28/15 1942 12/29/15 0225 12/30/15 0245  WBC 11.9*  --  10.1 8.4 10.2  NEUTROABS  --  8.8*  --   --   --   HGB 11.8*  --  11.0* 10.7* 11.4*  HCT 36.9*  --  34.6* 34.0* 35.5*  MCV 95.1  --  95.6 95.8 94.9  PLT 183  --  176 178 196   Cardiac Enzymes:  Recent Labs Lab 12/30/15 1535  TROPONINI 0.05*   BNP (last 3 results)  Recent Labs  12/28/15 1230  BNP 86.1    ProBNP (last 3 results) No results for input(s): PROBNP in the last 8760 hours.  CBG:  Recent Labs Lab 12/29/15 1659 12/29/15 2144 12/30/15 0741 12/30/15 1206 12/30/15 1731  GLUCAP 184* 220* 203* 209* 188*    Recent Results  (from the past 240 hour(s))  Blood culture (routine x 2)     Status: None (Preliminary result)   Collection Time: 12/28/15  2:05 PM  Result Value Ref Range Status   Specimen Description BLOOD BLOOD RIGHT FOREARM  Final   Special Requests BOTTLES DRAWN AEROBIC AND ANAEROBIC 5CC  Final   Culture NO GROWTH 2 DAYS  Final   Report Status PENDING  Incomplete  Blood culture (routine x 2)     Status: None (Preliminary result)   Collection Time: 12/28/15  2:20 PM  Result Value Ref Range Status   Specimen Description BLOOD LEFT HAND  Final   Special Requests BOTTLES DRAWN AEROBIC AND ANAEROBIC 5CC  Final   Culture NO GROWTH 2 DAYS  Final   Report Status PENDING  Incomplete  MRSA PCR Screening     Status: None   Collection Time: 12/28/15 10:00 PM  Result Value Ref Range Status   MRSA by PCR NEGATIVE NEGATIVE Final    Comment:        The GeneXpert MRSA Assay (FDA approved for NASAL specimens only), is one component of a comprehensive MRSA colonization surveillance program. It  is not intended to diagnose MRSA infection nor to guide or monitor treatment for MRSA infections.      Studies:  Recent x-ray studies have been reviewed in detail by the Attending Physician  Scheduled Meds:  Scheduled Meds: . atorvastatin  5 mg Oral q1800  . budesonide (PULMICORT) nebulizer solution  0.25 mg Nebulization BID  . DULoxetine  60 mg Oral Daily  . enoxaparin (LOVENOX) injection  40 mg Subcutaneous Q24H  . famotidine  10 mg Oral QHS  . fluticasone  2 spray Each Nare Daily  . guaiFENesin  1,200 mg Oral BID  . insulin aspart  0-15 Units Subcutaneous 6 times per day  . insulin aspart  0-5 Units Subcutaneous QHS  . ipratropium-albuterol  3 mL Nebulization QID  . levofloxacin (LEVAQUIN) IV  750 mg Intravenous Q24H  . levothyroxine  200 mcg Oral QAC breakfast  . loratadine  10 mg Oral Daily  . methylPREDNISolone (SOLU-MEDROL) injection  60 mg Intravenous Q12H  . montelukast  10 mg Oral QHS  .  psyllium  1 packet Oral Daily  . sucralfate  1 g Oral QID  . tamsulosin  0.4 mg Oral QPC supper  . tiZANidine  4 mg Oral BID    Time spent on care of this patient: 40 mins   [name], [name] , MD  [facility] Office  [phone] Pager [phone]  On-Call/Text Page:      [email or web address]      password [email or web address]  If 7PM-7AM, please contact night-coverage [email or web address] Password [email or web address] 12/30/2015, 7:48 PM   LOS: 2 days   Care during the described time interval was provided by me .  I have reviewed this patient's available data, including medical history, events of note, physical examination, and all test results as part of my evaluation. I have personally reviewed and interpreted all radiology studies.   Carolyne Littles, MD [phone] Pager

## 2015-12-30 NOTE — Progress Notes (Addendum)
CRITICAL VALUE ALERT  Critical value received:  Lactic Acid 2.4  Date of notification:  12/30/15  Time of notification:  1623  Critical value read back:Yes.    Nurse who received alert:  Lavona Mound   MD notified (1st page):  Dr. Joseph Art  Time of first page:  1633  MD notified (2nd page):  Time of second page:  Responding MD:  Dr. Joseph Art  Time MD responded: [phone]

## 2015-12-30 NOTE — Progress Notes (Signed)
Pt stated that he wants his neb treatments Q4hrs. RN aware

## 2015-12-30 NOTE — Evaluation (Addendum)
Occupational Therapy Evaluation Patient Details Name: [name] MRN: [id number] DOB: 1951/12/11 Today's Date: 12/30/2015    History of Present Illness 64yo man with hx of severe COPD on home oxygen, anxiety, hypothyroidism, HTN, HLD, OA, BPH, and chronic shoulder pain who presented w/ worsening SOB, wheezing, and increased cough with change in sputum and increased sleepiness per his wife. Further symptoms included pleuritic chest pain on the right.   Clinical Impression   PT admitted with SOB, wheezing and cough. Pt currently with functional limitiations due to the deficits listed below (see OT problem list). PTA patient was independent with all adls and able to stand for adls. Pt now requires 6 L oxygen with rest breaks every few minutes. Pt is unable to complete a bath without physical (A) and sitting. OT working with patient at w/c level to prepare for home. Pt is not safe to complete in standing or ambulating to bathroom.  Pt will benefit from skilled OT to increase their independence and safety with adls and balance to allow discharge HHOT , home aide and oxygen increase.  Pt and wife educated on EC during bath at sink level and need for w/c for home use. Pt reports discomfort in the bed in buttock area. GEO mat ordered to unit and RN notified of pending arrival.     Follow Up Recommendations  Home health OT;Other (comment) (home aide )    Equipment Recommendations  Other (comment);Tub/shower seat;Wheelchair (measurements OT);Wheelchair cushion (measurements OT) (RW)    Recommendations for Other Services Other (comment) (home aide / RN)     Precautions / Restrictions Precautions Precautions: Fall      Mobility Bed Mobility Overal bed mobility: Modified Independent Bed Mobility: Rolling;Supine to Sit Rolling: Modified independent (Device/Increase time)   Supine to sit: Supervision     General bed mobility comments: requires HOB elevated and bed  rails  Transfers Overall transfer level: Needs assistance Equipment used: 1 person hand held assist (+1 person (A) for lines /leads) Transfers: Sit to/from Stand Sit to Stand: Min assist         General transfer comment: able to stand pivot to chair. SImulated w/c level care at home. pt with DOE 3 out 4    Balance Overall balance assessment: Needs assistance Sitting-balance support: No upper extremity supported;Feet supported Sitting balance-Leahy Scale: Fair     Standing balance support: During functional activity;Single extremity supported Standing balance-Leahy Scale: Poor Standing balance comment: requires restbreaks and DOE 4 out 4 .                            ADL Overall ADL's : Needs assistance/impaired Eating/Feeding: Independent   Grooming: Wash/dry face;Wash/dry hands;Oral care;Minimal assistance;Sitting Grooming Details (indicate cue type and reason): required (A) due to DOE. pt unable to stand to complete as previously done prior Upper Body Bathing: Set up;Sitting Upper Body Bathing Details (indicate cue type and reason): required rest break after face, after arms and for lower abdomen. Decr saturation 88% on 6 L Lower Body Bathing: Moderate assistance;Sit to/from stand Lower Body Bathing Details (indicate cue type and reason): decr satruations and DOE 4 out 4   Upper Body Dressing Details (indicate cue type and reason): declined to don robe due to feeling SOB and hot Lower Body Dressing: Sit to/from stand;Minimal assistance Lower Body Dressing Details (indicate cue type and reason): pt able to cross bil LE in the bed to reach feet. OT completed don of new socks  total (A) due to DOE Toilet Transfer: Minimal assistance;Stand-pivot;BSC (requires use of bil hands)             General ADL Comments: Pt in bed on arrival and completed stand pivot to recliner to simulate w/c level care. pt complete bathing at sink with restbreaks every few minutes. due to  DOE. Pt uses long hand sponge at home for LB bathing. Pt will have wife (A) as needed but wife works. Pt coudl benefit from home aide to help with ADLS due to fatigue and SOB.      Vision     Perception     Praxis      Pertinent Vitals/Pain Pain Assessment: No/denies pain     Hand Dominance Right   Extremity/Trunk Assessment Upper Extremity Assessment Upper Extremity Assessment: RUE deficits/detail RUE Deficits / Details: previous shoulder surg with shoulder flexion ~ 80 degrees and decr internal adduction  unable to reach backward for peri care   Lower Extremity Assessment Lower Extremity Assessment: Defer to PT evaluation   Cervical / Trunk Assessment Cervical / Trunk Assessment: Kyphotic;Other exceptions (abdominal hernia) Cervical / Trunk Exceptions: large hernia on R side of the abdomen   Communication Communication Communication: No difficulties   Cognition Arousal/Alertness: Awake/alert Behavior During Therapy: WFL for tasks assessed/performed Overall Cognitive Status: Within Functional Limits for tasks assessed                     General Comments       Exercises       Shoulder Instructions      Home Living Family/patient expects to be discharged to:: Private residence Living Arrangements: Spouse/significant other;Children Available Help at Discharge: Family;Available 24 hours/day Type of Home: House Home Access: Stairs to enter [employer] of Steps: 1   Home Layout: Two level;Able to live on main level with bedroom/bathroom     Bathroom Shower/Tub: Producer, television/film/video: Standard     Home Equipment: Bedside commode;Shower seat - built in;Walker - 4 wheels;Cane - single point   Additional Comments: Was using 3LO2 at home for last 5-6 years.      Prior Functioning/Environment Level of Independence: Independent        Comments: Sleeps in recliner per pt    OT Diagnosis: Generalized weakness   OT Problem  List: Decreased strength;Decreased activity tolerance;Impaired balance (sitting and/or standing);Decreased cognition;Decreased safety awareness;Decreased knowledge of use of DME or AE;Decreased knowledge of precautions;Cardiopulmonary status limiting activity;Obesity   OT Treatment/Interventions: Self-care/ADL training;Therapeutic exercise;DME and/or AE instruction;Therapeutic activities;Balance training;Patient/family education;Energy conservation    OT Goals(Current goals can be found in the care plan section) Acute Rehab OT Goals Patient Stated Goal: to return home OT Goal Formulation: With patient/family Time For Goal Achievement: 01/13/16 Potential to Achieve Goals: Good  OT Frequency: Min 2X/week   Barriers to D/C:            Co-evaluation              End of Session Equipment Utilized During Treatment: Oxygen Nurse Communication: Mobility status;Precautions  Activity Tolerance: Other (comment) (decr saturations, HR incr, ) Patient left: in chair;with call bell/phone within reach;with family/visitor present   Time: 0912-0950 OT Time Calculation (min): 38 min Charges:  OT General Charges $OT Visit: 1 Procedure OT Evaluation $OT Eval High Complexity: 1 Procedure OT Treatments $Self Care/Home Management : 8-22 mins G-Codes:    Boone Master B Jan 29, 2016, 11:29 AM  Mateo Flow   OTR/L Pager: [phone] Office:  [phone] .    

## 2015-12-30 NOTE — Progress Notes (Signed)
PRN given. Pt c/o SOB.

## 2015-12-30 NOTE — Progress Notes (Signed)
CRITICAL VALUE ALERT  Critical value received:  2.4 ( Value same as last lab draw)  Date of notification:  12/30/15  Time of notification:  1921  Critical value read back:Yes.    Nurse who received alert:  Lavona Mound   MD notified (1st page):  Dr. Craige Cotta, night coverage  Time of first page:  1922  MD notified (2nd page):  Time of second page:  Responding MD:    Time MD responded:

## 2015-12-31 ENCOUNTER — Inpatient Hospital Stay (HOSPITAL_COMMUNITY): Payer: Medicare Other

## 2015-12-31 DIAGNOSIS — I509 Heart failure, unspecified: Secondary | ICD-10-CM

## 2015-12-31 LAB — LEGIONELLA ANTIGEN, URINE

## 2015-12-31 LAB — LACTIC ACID, PLASMA: LACTIC ACID, VENOUS: 1.6 mmol/L (ref 0.5–2.0)

## 2015-12-31 LAB — TROPONIN I: Troponin I: <0.03 ng/mL (ref <0.031)

## 2015-12-31 LAB — HEMOGLOBIN A1C
Hgb A1c MFr Bld: 6.2 % — ABNORMAL HIGH (ref 4.8–5.6)
Hgb A1c MFr Bld: 6.2 % — ABNORMAL HIGH (ref 4.8–5.6)
MEAN PLASMA GLUCOSE: 131 mg/dL
Mean Plasma Glucose: 131 mg/dL

## 2015-12-31 LAB — GLUCOSE, CAPILLARY
Glucose-Capillary: 231 mg/dL — ABNORMAL HIGH (ref 65–99)
Glucose-Capillary: 147 mg/dL — ABNORMAL HIGH (ref 65–99)
Glucose-Capillary: 159 mg/dL — ABNORMAL HIGH (ref 65–99)

## 2015-12-31 MED ORDER — ENOXAPARIN SODIUM 60 MG/0.6ML ~~LOC~~ SOLN
50.0000 mg | SUBCUTANEOUS | Status: DC
  Administered 2015-12-31 – 2016-01-03 (×4): 50 mg via SUBCUTANEOUS
  Filled 2015-12-31 (×4): qty 0.6

## 2015-12-31 MED ORDER — FUROSEMIDE 10 MG/ML IJ SOLN
40.0000 mg | Freq: Once | INTRAMUSCULAR | Status: AC
  Administered 2015-12-31: 40 mg via INTRAVENOUS
  Filled 2015-12-31: qty 4

## 2015-12-31 MED ORDER — PERFLUTREN LIPID MICROSPHERE
1.0000 mL | INTRAVENOUS | Status: DC | PRN
  Administered 2015-12-31: 3 mL via INTRAVENOUS
  Filled 2015-12-31: qty 10

## 2015-12-31 NOTE — Progress Notes (Signed)
Physical Therapy Treatment Patient Details Name: [name] MRN: [id number] DOB: 11-22-1951 Today's Date: 12/31/2015    History of Present Illness 64yo man with hx of severe COPD on home oxygen, anxiety, hypothyroidism, HTN, HLD, OA, BPH, and chronic shoulder pain who presented w/ worsening SOB, wheezing, and increased cough with change in sputum and increased sleepiness per his wife. Further symptoms included pleuritic chest pain on the right.    PT Comments    Pt admitted with above diagnosis. Pt currently with functional limitations due to balance and endurance deficits. Pt able to ambulate to bathroom and back but continues to be limited by significant SOB and desat with activity.  Wife present and does asssit pt well.  Pt will benefit from skilled PT to increase their independence and safety with mobility to allow discharge to the venue listed below.    Follow Up Recommendations  Home health PT;Supervision/Assistance - 24 hour     Equipment Recommendations  Wheelchair (measurements PT);Wheelchair cushion (measurements PT);Hospital bed (may need wheelchair for community/hosp bed for pulm issues)    Recommendations for Other Services       Precautions / Restrictions Precautions Precautions: Fall Restrictions Weight Bearing Restrictions: No    Mobility  Bed Mobility Overal bed mobility: Modified Independent Bed Mobility: Rolling;Supine to Sit Rolling: Modified independent (Device/Increase time)   Supine to sit: Modified independent (Device/Increase time)        Transfers Overall transfer level: Needs assistance Equipment used: Rolling walker (2 wheeled) Transfers: Sit to/from Stand Sit to Stand: Min guard         General transfer comment: guard assist for steadying pt.    Ambulation/Gait Ambulation/Gait assistance: Min guard Ambulation Distance (Feet): 15 Feet Assistive device: Rolling walker (2 wheeled);None Gait Pattern/deviations: Step-through  pattern;Decreased stride length   Gait velocity interpretation: Below normal speed for age/gender General Gait Details: Pt was able to ambulate into bathroom with rW with steady gait but did need cues to stay close to RW.  Once in bathroom, pt needed assist to get urinal placed so he could urinate.  Pt maintained balance to urinate with wide BOS but did have to hand the urinal to PT then needing HHA to step over to toilet so he could have BM.  Once pt finished, he stood with min assist using rail beside toilet and did not use the RW to walk back to chair but did use HHA of PT to get back to chair for steadying.     Stairs            Wheelchair Mobility    Modified Rankin (Stroke Patients Only)       Balance Overall balance assessment: Needs assistance;History of Falls Sitting-balance support: No upper extremity supported;Feet supported Sitting balance-Leahy Scale: Fair     Standing balance support: Bilateral upper extremity supported;During functional activity Standing balance-Leahy Scale: Poor Standing balance comment: requires UE support for balance in standing.                     Cognition Arousal/Alertness: Awake/alert Behavior During Therapy: WFL for tasks assessed/performed Overall Cognitive Status: Within Functional Limits for tasks assessed                      Exercises General Exercises - Upper Extremity Shoulder Flexion: AROM;Both;10 reps;Seated General Exercises - Lower Extremity Ankle Circles/Pumps: AROM;Both;10 reps;Seated Long Arc Quad: AROM;Both;10 reps;Seated Hip Flexion/Marching: AROM;Both;10 reps;Seated    General Comments  Pertinent Vitals/Pain Pain Assessment: No/denies pain  O2 90-93% on 5LO2 at rest.  Had to incr O2 to 6LO2 with activity with sats 84-90% with ambulation and took 3-4 min to recover with rest.  HR 73-103 bpm with activity.      Home Living                      Prior Function            PT  Goals (current goals can now be found in the care plan section) Progress towards PT goals: Progressing toward goals    Frequency  Min 3X/week    PT Plan Current plan remains appropriate    Co-evaluation             End of Session Equipment Utilized During Treatment: Gait belt;Oxygen Activity Tolerance: Patient limited by fatigue Patient left: with call bell/phone within reach;in chair;with family/visitor present     Time: 1610-9604 PT Time Calculation (min) (ACUTE ONLY): 37 min  Charges:  $Gait Training: 8-22 mins $Therapeutic Exercise: 8-22 mins                    G Codes:      Jaydee Conran F 01-15-2016, 2:10 PM  [facility] [phone] (pager)

## 2015-12-31 NOTE — Progress Notes (Signed)
Echocardiogram 2D Echocardiogram has been performed.  [name] 12/31/2015, 8:12 AM

## 2015-12-31 NOTE — Progress Notes (Signed)
[place] TEAM 1 - Stepdown/ICU TEAM PROGRESS NOTE  Jaymin Waln ZOX:[id number] DOB: [birth year] DOA: 12/28/2015 PCP: Leo Grosser, MD  Admit HPI / Brief Narrative: 65yo man with hx of severe COPD on home oxygen, anxiety, hypothyroidism, HTN, HLD, OA, BPH, and chronic shoulder pain who presented w/ worsening SOB, wheezing, and increased cough with change in sputum and increased sleepiness per his wife. Further symptoms included pleuritic chest pain on the right.  In the ED, he was awake and alert, ABG showed CO2 was mildly elevated, but O2 was low. He improved greatly with a continuous albuterol nebulizer but continued to have elevated oxygen requirements and needed face tent oxygen. He did not require BIPAP.   HPI/Subjective: Making slow progress.  Still sob, and must pause mid sentence to breath, but overall feels somewhat improved.  Denies cp, n/v, or abdom pain.  Is still not consistently moving his bowels.    Assessment/Plan:  Bibasliar pneumonia Continue empiric antibiotic therapy  Acute on Chronic respiratory failure with hypoxia due to COPD  Continue medical treatment for acute COPD exacerbation with supportive oxygen as well - slowly improving - keep in SDU additional 24hrs due to persisting dyspnea   Edema  ?simply due to volume   AKI - resolved  crt has returned to normal w/ gently hydration   Hypothyroidism Continue home medical therapy  Essential hypertension Blood pressure currently well controlled  Anxiety state Well compensated at present  Code Status: FULL Family Communication: Spoke with patient and his wife at bedside  Disposition Plan: SDU  Consultants: None   Procedures: 1/18 TTE - pending   Antibiotics: Levaquin 1/15 > Vancomycin 1/15  DVT prophylaxis: Lovenox  Objective: Blood pressure 138/84, pulse 67, temperature 97.6 F (36.4 C), temperature source Oral, resp. rate 25, height  (1.626 m), weight 101.5 kg (223 lb 12.3 oz),  SpO2 93 %.  Intake/Output Summary (Last 24 hours) at 12/31/15 0932 Last data filed at 12/30/15 2312  Gross per 24 hour  Intake    360 ml  Output    200 ml  Net    160 ml   Exam: General: No severe distress - still pausing mid sentence to breathe Lungs: Poor air movement throughout - mild wheezing - bibasilar crackles Cardiovascular: Regular rate and rhythm without murmur  Abdomen: Nontender, nondistended, soft, bowel sounds positive, no rebound, no ascites Extremities: No significant cyanosis, or clubbing;  1+ edema bilateral lower extremities  Data Reviewed:  Basic Metabolic Panel:  Recent Labs Lab 12/28/15 1230 12/28/15 1942 12/29/15 0225 12/30/15 0245  NA 136  --  140 139  K 4.1  --  4.4 4.1  CL 97*  --  103 102  CO2 29  --  28 26  GLUCOSE 149*  --  226* 178*  BUN 19  --  16 15  CREATININE 1.22 0.99 0.89 0.72  CALCIUM 9.2  --  9.4 9.6    CBC:  Recent Labs Lab 12/28/15 1230 12/28/15 1324 12/28/15 1942 12/29/15 0225 12/30/15 0245  WBC 11.9*  --  10.1 8.4 10.2  NEUTROABS  --  8.8*  --   --   --   HGB 11.8*  --  11.0* 10.7* 11.4*  HCT 36.9*  --  34.6* 34.0* 35.5*  MCV 95.1  --  95.6 95.8 94.9  PLT 183  --  176 178 196    Liver Function Tests:  Recent Labs Lab 12/28/15 1230  AST 28  ALT 33  ALKPHOS 98  BILITOT  1.4*  PROT 6.8  ALBUMIN 2.8*     Recent Results (from the past 240 hour(s))  Blood culture (routine x 2)     Status: None (Preliminary result)   Collection Time: 12/28/15  2:05 PM  Result Value Ref Range Status   Specimen Description BLOOD BLOOD RIGHT FOREARM  Final   Special Requests BOTTLES DRAWN AEROBIC AND ANAEROBIC 5CC  Final   Culture NO GROWTH 2 DAYS  Final   Report Status PENDING  Incomplete  Blood culture (routine x 2)     Status: None (Preliminary result)   Collection Time: 12/28/15  2:20 PM  Result Value Ref Range Status   Specimen Description BLOOD LEFT HAND  Final   Special Requests BOTTLES DRAWN AEROBIC AND ANAEROBIC 5CC   Final   Culture NO GROWTH 2 DAYS  Final   Report Status PENDING  Incomplete  MRSA PCR Screening     Status: None   Collection Time: 12/28/15 10:00 PM  Result Value Ref Range Status   MRSA by PCR NEGATIVE NEGATIVE Final    Comment:        The GeneXpert MRSA Assay (FDA approved for NASAL specimens only), is one component of a comprehensive MRSA colonization surveillance program. It is not intended to diagnose MRSA infection nor to guide or monitor treatment for MRSA infections.      Studies:   Recent x-ray studies have been reviewed in detail by the Attending Physician  Scheduled Meds:  Scheduled Meds: . atorvastatin  5 mg Oral q1800  . budesonide (PULMICORT) nebulizer solution  0.25 mg Nebulization BID  . DULoxetine  60 mg Oral Daily  . enoxaparin (LOVENOX) injection  40 mg Subcutaneous Q24H  . famotidine  10 mg Oral QHS  . fluticasone  2 spray Each Nare Daily  . guaiFENesin  1,200 mg Oral BID  . insulin aspart  0-15 Units Subcutaneous TID WC  . insulin aspart  0-5 Units Subcutaneous QHS  . ipratropium-albuterol  3 mL Nebulization QID  . levofloxacin (LEVAQUIN) IV  750 mg Intravenous Q24H  . levothyroxine  200 mcg Oral QAC breakfast  . loratadine  10 mg Oral Daily  . methylPREDNISolone (SOLU-MEDROL) injection  60 mg Intravenous Q12H  . montelukast  10 mg Oral QHS  . psyllium  1 packet Oral Daily  . sucralfate  1 g Oral QID  . tamsulosin  0.4 mg Oral QPC supper  . tiZANidine  4 mg Oral BID    Time spent on care of this patient: 35 mins   Shantil Vallejo T , MD   [facility] Office  [phone] Pager - Text Page per Loretha Stapler as per below:  On-Call/Text Page:      [email or web address]      password [email or web address]  If 7PM-7AM, please contact night-coverage [email or web address] Password [email or web address] 12/31/2015, 9:32 AM   LOS: 3 days

## 2015-12-31 NOTE — Patient Instructions (Signed)
FLEXION: Sitting (Active)    Sit, both feet flat. Lift right knee toward ceiling. Use ___ lbs. Complete ___ sets of ___ repetitions. Perform ___ sessions per day.  Copyright  VHI. All rights reserved.  FLEXION: Sitting (Active)    Sit with right leg extended. Bend knee and draw foot backward. Use ___ lbs. Complete ___ sets of ___ repetitions. Perform ___ sessions per day.  Copyright  VHI. All rights reserved.  FLEXION: Sitting (Active)    Sit with right foot flat. Bend ankle, bringing toes toward head. Use ___ lbs. Complete ___ sets of ___ repetitions. Perform ___ sessions per day.  Copyright  VHI. All rights reserved.  FLEXION: Sitting (Active)    Sit with right arm at side. Lift arm forward as high as possible, keeping elbow straight. Use ___ lbs. Complete ___ sets of ___ repetitions. Perform ___ sessions per day.  Copyright  VHI. All rights reserved.

## 2015-12-31 NOTE — Care Management Important Message (Signed)
Important Message  Patient Details  Name: [name] MRN: [id number] Date of Birth: [birth year]   Medicare Important Message Given:  Yes    Hanley Hays, RN 12/31/2015, 9:29 AM

## 2016-01-01 ENCOUNTER — Telehealth: Payer: Self-pay | Admitting: Pulmonary Disease

## 2016-01-01 LAB — GLUCOSE, CAPILLARY
Glucose-Capillary: 153 mg/dL — ABNORMAL HIGH (ref 65–99)
Glucose-Capillary: 123 mg/dL — ABNORMAL HIGH (ref 65–99)
Glucose-Capillary: 145 mg/dL — ABNORMAL HIGH (ref 65–99)
Glucose-Capillary: 193 mg/dL — ABNORMAL HIGH (ref 65–99)
Glucose-Capillary: 133 mg/dL — ABNORMAL HIGH (ref 65–99)
Glucose-Capillary: 163 mg/dL — ABNORMAL HIGH (ref 65–99)

## 2016-01-01 LAB — CBC
HCT: 36.7 % — ABNORMAL LOW (ref 39.0–52.0)
HEMOGLOBIN: 11.8 g/dL — AB (ref 13.0–17.0)
MCH: 30.1 pg (ref 26.0–34.0)
MCHC: 32.2 g/dL (ref 30.0–36.0)
MCV: 93.6 fL (ref 78.0–100.0)
Platelets: 214 10*3/uL (ref 150–400)
RBC: 3.92 MIL/uL — ABNORMAL LOW (ref 4.22–5.81)
RDW: 13.6 % (ref 11.5–15.5)
WBC: 6.6 10*3/uL (ref 4.0–10.5)

## 2016-01-01 LAB — RESPIRATORY VIRUS PANEL
ADENOVIRUS: NEGATIVE
INFLUENZA A: NEGATIVE
INFLUENZA B 1: NEGATIVE
Metapneumovirus: NEGATIVE
PARAINFLUENZA 1 A: NEGATIVE
Parainfluenza 2: NEGATIVE
Parainfluenza 3: NEGATIVE
RESPIRATORY SYNCYTIAL VIRUS A: NEGATIVE
RESPIRATORY SYNCYTIAL VIRUS B: NEGATIVE
Rhinovirus: NEGATIVE

## 2016-01-01 LAB — BASIC METABOLIC PANEL
ANION GAP: 10 (ref 5–15)
BUN: 16 mg/dL (ref 6–20)
CALCIUM: 9 mg/dL (ref 8.9–10.3)
CO2: 28 mmol/L (ref 22–32)
Chloride: 100 mmol/L — ABNORMAL LOW (ref 101–111)
Creatinine, Ser: 0.72 mg/dL (ref 0.61–1.24)
GLUCOSE: 146 mg/dL — AB (ref 65–99)
Potassium: 4.7 mmol/L (ref 3.5–5.1)
SODIUM: 138 mmol/L (ref 135–145)

## 2016-01-01 NOTE — Progress Notes (Signed)
md req we ck on imogen port o2 syem that runs on battery-car charger-elect. Have spoken w Vaughan Basta w [facility] who will ck on this. Pt has [facility]. Pt has concent and port system w [facility] now. Pt would also like [facility] to ck on his home neb  That he has now and he would like to get a rolator(rolling walker w seat). Have spoken w Vaughan Basta about rolator also. phy there has rec hhpt and w sob have req hhpt and hhrn. Tent ref to [facility] and await final orders from md. Will cont to follow.

## 2016-01-01 NOTE — Progress Notes (Signed)
Pharmacy Antibiotic Follow-up Note  [name] is a 65 y.o. year-old male admitted on 12/28/2015.  The patient is currently on day 5 of levaquin for PNA.  Making slow improvement, afebrile.  Assessment/Plan: 1. Continue Levaquin at current dose of 750 mg IV q 24 hrs. 2. Will f/u for LOT.  Temp (24hrs), Avg:97.9 F (36.6 C), Min:97.3 F (36.3 C), Max:98.1 F (36.7 C)   Recent Labs Lab 12/28/15 1230 12/28/15 1942 12/29/15 0225 12/30/15 0245 01/01/16 0244  WBC 11.9* 10.1 8.4 10.2 6.6    Recent Labs Lab 12/28/15 1230 12/28/15 1942 12/29/15 0225 12/30/15 0245 01/01/16 0244  CREATININE 1.22 0.99 0.89 0.72 0.72   Estimated Creatinine Clearance: 104.8 mL/min (by C-G formula based on Cr of 0.72).    Allergies  Allergen Reactions  . Penicillins     Antimicrobials this admission: 1/15 levaquin 750 q 24 hr >  1/15 vanco > 1/15  Levels/dose changes this admission: n/a  Microbiology results: 1/15 blood x 2 > ngtd  Thank you for allowing pharmacy to be a part of this patient's care.  Tad Moore, BCPS  Clinical Pharmacist Pager [phone]  01/01/2016 10:14 AM

## 2016-01-01 NOTE — Progress Notes (Addendum)
[place] TEAM 1 - Stepdown/ICU TEAM Progress Note  [name] ZOX:[id number] DOB: 1951-01-21 DOA: 12/28/2015 PCP: Leo Grosser, MD  Admit HPI / Brief Narrative: [name] is a 64yo WM PMHx Anxiety, Severe COPD on 3 L O2 home Hypothyroidism, HTN, HLD, OA, BPH, Chronic Shoulder Pain   Presents for worsening SOB, wheezing, increased cough with change in sputum and increased sleepiness per his wife. [name] reports that he was exposed to a family member about 1 week ago who was recovering from pneumonia. On Wednesday, he went to see his pulmonologist and was doing okay, but after that time he began to feel worse. He developed a worsening cough with dark green, bad tasting sputum that was more watery than normal for him. He also noted increased SOB despite his home oxygen dose. Further symptoms included pleuritic chest pain on the right and increased sleeping with falling asleep while trying to eat. He also noticed some dizziness upon standing which is new. He denies any fever or chills or substernal chest pain. He has been taking his inhalers and medications as prescribed. He does report swelling in his legs, but this is chronic and he wears supportive stockings to help with this.   In the ED, he was awake and alert, ABG showed CO2 was mildly elevated, but O2 was low. He improved greatly with a continuous albuterol nebulizer but continued to have elevated oxygen requirements and needs face tent oxygen. He did not require BIPAP in the short term.    HPI/Subjective: 1/19  A/O 4, positive a little purulent sputum (green). States uses 3 L O2 at home 24 hours a day. Able to ambulate to bathroom and back. Does not have      Assessment/Plan: Bibasliar pneumonia/CAP -Continue empiric antibiotic therapy -Pulmicort nebulizer BID -DuoNeb QID -Sodium Medrol 60 mg BID -Singulair 10 mg daily -Flutter valve -Physiotherapy vest BID -Mucinex BID -Echocardiogram Pending -1/19 Spoke with  Dr.Vineet Craige Cotta [facility], and reviewed case. Dr.Sood stated he felt therapy was appropriate and felt no changes required.    Acute on Chronic respiratory failure with hypoxia due to COPD Exacerbation -Continue usual medical treatment for acute COPD exacerbation with supportive oxygen as well -CSW consult; Meet with family Will Pt's Insurance pay for Inogen Portable Oxygen Concentrator?   Lactic acidosis -Trending down, continue normal saline 36ml/hr; DC'd ~1915 secondary to patient feeling increasing shortness of breath. -PCXR showed actual improvement of airspace disease, negative pulmonary edema.  CHF? -Echocardiogram pending -Strict in and out -Daily weight  Essential hypertension -Blood pressure currently well controlled  AKI -Cr has returned to normal w/ gently hydration   Hypothyroidism -Continue levothyroxine 200 g daily  Anxiety state -Continue Ativan 0.5 mg q 6hr PRN  -Cymbalta 50 mg daily  Hyperglycemia/diabetes? -HgA1C pending  -Moderate SSI    Code Status: FULL Family Communication: Wife and daughter present at time of exam Disposition Plan: Resolution sepsis    Consultants: Phone consult Dr.Vineet Craige Cotta [facility]  Procedure/Significant Events: 1/18 Echocardiogram;- LVEF= 55% to 60%. - (grade 1 diastolic dysfunction).    Culture 1/15 blood right forearm/Left hand NGTD 1/15 MRSA by PCR negative 1/17 influenza A/B/H1N1 negative 1/17 respiratory virus pending 1/17 sputum pending 1/17 strep pneumo urine antigen/Legionella urine antigen negative  Antibiotics: Levaquin 1/15 > Vancomycin 1/15  DVT prophylaxis: Lovenox   Devices    LINES / TUBES:      Continuous Infusions:   Objective: VITAL SIGNS: Temp: 97.3 F (36.3 C) (01/19 0725) Temp Source: Oral (01/19  0725) BP: 144/89 mmHg (01/19 0725) Pulse Rate: 64 (01/19 0725) SPO2; FIO2:   Intake/Output Summary (Last 24 hours) at 01/01/16 0834 Last data filed at 01/01/16 0500  Gross per 24  hour  Intake    870 ml  Output   3550 ml  Net  -2680 ml     Exam: General: Positive  acute on Chronic respiratory distress Eyes: Negative headache, double vision,negative scleral hemorrhage ENT: Negative Runny nose, negative gingival bleeding Neck:  Negative scars, masses, torticollis, lymphadenopathy, JVD Lungs: Tachypnea Decreased Breath sounds LLL, Diffuse wheezes Rt Lung fields, negative crackles Cardiovascular: Regular rate and rhythm without murmur gallop or rub normal S1 and S2 Abdomen:negative abdominal pain, nondistended, RLQ Hernia (reducable),  positive soft, bowel sounds, no rebound, no ascites, no appreciable mass Extremities: No significant cyanosis, clubbing, Positive bilateral lower extremities Edema1-2+  Lt>>Rt Psychiatric:  Negative depression, negative anxiety, negative fatigue, negative mania  Neurologic:  Cranial nerves II through XII intact, tongue/uvula midline, all extremities muscle strength 5/5, sensation intact throughout,  negative dysarthria, negative expressive aphasia, negative receptive aphasia.   Data Reviewed: Basic Metabolic Panel:  Recent Labs Lab 12/28/15 1230 12/28/15 1942 12/29/15 0225 12/30/15 0245 01/01/16 0244  NA 136  --  140 139 138  K 4.1  --  4.4 4.1 4.7  CL 97*  --  103 102 100*  CO2 29  --  GLUCOSE 149*  --  226* 178* 146*  BUN 19  --  CREATININE 1.22 0.99 0.89 0.72 0.72  CALCIUM 9.2  --  9.4 9.6 9.0   Liver Function Tests:  Recent Labs Lab 12/28/15 1230  AST 28  ALT 33  ALKPHOS 98  BILITOT 1.4*  PROT 6.8  ALBUMIN 2.8*   No results for input(s): LIPASE, AMYLASE in the last 168 hours. No results for input(s): AMMONIA in the last 168 hours. CBC:  Recent Labs Lab 12/28/15 1230 12/28/15 1324 12/28/15 1942 12/29/15 0225 12/30/15 0245 01/01/16 0244  WBC 11.9*  --  10.1 8.4 10.2 6.6  NEUTROABS  --  8.8*  --   --   --   --   HGB 11.8*  --  11.0* 10.7* 11.4* 11.8*  HCT 36.9*  --  34.6* 34.0*  35.5* 36.7*  MCV 95.1  --  95.6 95.8 94.9 93.6  PLT 183  --  176 178 196 214   Cardiac Enzymes:  Recent Labs Lab 12/30/15 1535 12/30/15 2128 12/31/15 0310  TROPONINI 0.05* <0.03 <0.03   BNP (last 3 results)  Recent Labs  12/28/15 1230  BNP 86.1    ProBNP (last 3 results) No results for input(s): PROBNP in the last 8760 hours.  CBG:  Recent Labs Lab 12/30/15 2020 12/31/15 0727 12/31/15 1255 12/31/15 1711 12/31/15 2112  GLUCAP 231* 147* 153* 123* 159*    Recent Results (from the past 240 hour(s))  Blood culture (routine x 2)     Status: None (Preliminary result)   Collection Time: 12/28/15  2:05 PM  Result Value Ref Range Status   Specimen Description BLOOD BLOOD RIGHT FOREARM  Final   Special Requests BOTTLES DRAWN AEROBIC AND ANAEROBIC 5CC  Final   Culture NO GROWTH 3 DAYS  Final   Report Status PENDING  Incomplete  Blood culture (routine x 2)     Status: None (Preliminary result)   Collection Time: 12/28/15  2:20 PM  Result Value Ref Range Status   Specimen Description BLOOD LEFT HAND  Final  Special Requests BOTTLES DRAWN AEROBIC AND ANAEROBIC 5CC  Final   Culture NO GROWTH 3 DAYS  Final   Report Status PENDING  Incomplete  MRSA PCR Screening     Status: None   Collection Time: 12/28/15 10:00 PM  Result Value Ref Range Status   MRSA by PCR NEGATIVE NEGATIVE Final    Comment:        The GeneXpert MRSA Assay (FDA approved for NASAL specimens only), is one component of a comprehensive MRSA colonization surveillance program. It is not intended to diagnose MRSA infection nor to guide or monitor treatment for MRSA infections.      Studies:  Recent x-ray studies have been reviewed in detail by the Attending Physician  Scheduled Meds:  Scheduled Meds: . atorvastatin  5 mg Oral q1800  . budesonide (PULMICORT) nebulizer solution  0.25 mg Nebulization BID  . DULoxetine  60 mg Oral Daily  . enoxaparin (LOVENOX) injection  50 mg Subcutaneous Q24H    . famotidine  10 mg Oral QHS  . fluticasone  2 spray Each Nare Daily  . guaiFENesin  1,200 mg Oral BID  . insulin aspart  0-15 Units Subcutaneous TID WC  . insulin aspart  0-5 Units Subcutaneous QHS  . ipratropium-albuterol  3 mL Nebulization QID  . levofloxacin (LEVAQUIN) IV  750 mg Intravenous Q24H  . levothyroxine  200 mcg Oral QAC breakfast  . loratadine  10 mg Oral Daily  . methylPREDNISolone (SOLU-MEDROL) injection  60 mg Intravenous Q12H  . montelukast  10 mg Oral QHS  . psyllium  1 packet Oral Daily  . sucralfate  1 g Oral QID  . tamsulosin  0.4 mg Oral QPC supper  . tiZANidine  4 mg Oral BID    Time spent on care of this patient: 40 mins   WOODS, Roselind Messier , MD  [facility] Office  [phone] Pager [phone]  On-Call/Text Page:      [email or web address]      password [email or web address]  If 7PM-7AM, please contact night-coverage [email or web address] Password [email or web address] 01/01/2016, 8:34 AM   LOS: 4 days   Care during the described time interval was provided by me .  I have reviewed this patient's available data, including medical history, events of note, physical examination, and all test results as part of my evaluation. I have personally reviewed and interpreted all radiology studies.   Carolyne Littles, MD [phone] Pager

## 2016-01-01 NOTE — Telephone Encounter (Signed)
Spoke with pt's daughter, Morrie Sheldon. She is aware that VS has contacted the hospitalist. Nothing further was needed.

## 2016-01-01 NOTE — Progress Notes (Signed)
[facility] TEAM 1 - Stepdown/ICU TEAM Progress Note  [name] MVH:[id number] DOB: 1951/04/02 DOA: 12/28/2015 PCP: Leo Grosser, MD  Admit HPI / Brief Narrative: [name] is a 64yo WM PMHx Anxiety, Severe COPD on 3 L O2 home Hypothyroidism, HTN, HLD, OA, BPH, Chronic Shoulder Pain   Presents for worsening SOB, wheezing, increased cough with change in sputum and increased sleepiness per his wife. [name] reports that he was exposed to a family member about 1 week ago who was recovering from pneumonia. On Wednesday, he went to see his pulmonologist and was doing okay, but after that time he began to feel worse. He developed a worsening cough with dark green, bad tasting sputum that was more watery than normal for him. He also noted increased SOB despite his home oxygen dose. Further symptoms included pleuritic chest pain on the right and increased sleeping with falling asleep while trying to eat. He also noticed some dizziness upon standing which is new. He denies any fever or chills or substernal chest pain. He has been taking his inhalers and medications as prescribed. He does report swelling in his legs, but this is chronic and he wears supportive stockings to help with this.   In the ED, he was awake and alert, ABG showed CO2 was mildly elevated, but O2 was low. He improved greatly with a continuous albuterol nebulizer but continued to have elevated oxygen requirements and needs face tent oxygen. He did not require BIPAP in the short term.    HPI/Subjective: 1/19  A/O 4, positive a little purulent sputum (green). States uses 3 L O2 at home 24 hours a day. Able to ambulate to bathroom and back. Does not have      Assessment/Plan: Bibasliar pneumonia/CAP -Continue empiric antibiotic therapy -Pulmicort nebulizer BID -DuoNeb QID -Sodium Medrol 60 mg BID -Singulair 10 mg daily -Flutter valve -Physiotherapy vest BID -Mucinex BID -Echocardiogram; grade 1 diastolic  dysfunction  -1/19 Spoke with [name], and reviewed case. [name] stated he felt therapy was appropriate and felt no changes required.    Acute on Chronic respiratory failure with hypoxia due to COPD Exacerbation -Continue usual medical treatment for acute COPD exacerbation with supportive oxygen as well -CSW consult; Meet with family Will Pt's Insurance pay for [employer]ogen Portable Oxygen Concentrator?   Lactic acidosis -WNL -PCXR showed actual improvement of airspace disease, negative pulmonary edema.  Diastolic CHF -Echocardiogram results below -Strict in and out since admission -5.5 L -Daily weight  Essential hypertension -Blood pressure currently well controlled  AKI -Cr has returned to normal w/ gently hydration   Hypothyroidism -Continue levothyroxine 200 g daily  Anxiety state -Continue Ativan 0.5 mg q 6hr PRN  -Cymbalta 50 mg daily  Hyperglycemia/diabetes? -1/17 HgA1C = 6.2  -Continue Moderate SSI    Code Status: FULL Family Communication: Wife present at time of exam Disposition Plan: Resolution sepsis    Consultants: Phone consult [name]  Procedure/Significant Events: 1/18 Echocardiogram;- LVEF= 55% to 60%. - (grade 1 diastolic dysfunction).    Culture 1/15 blood right forearm/Left hand NGTD 1/15 MRSA by PCR negative 1/17 influenza A/B/H1N1 negative 1/17 respiratory virus pending 1/17 sputum pending 1/17 strep pneumo urine antigen/Legionella urine antigen negative  Antibiotics: Levaquin 1/15 > Vancomycin 1/15  DVT prophylaxis: Lovenox   Devices    LINES / TUBES:      Continuous Infusions:   Objective: VITAL SIGNS: Temp: 97.4 F (36.3 C) (01/19 1216) Temp Source: Oral (01/19 1216) BP: 126/81 mmHg (  01/19 1216) Pulse Rate: 76 (01/19 1631) SPO2; FIO2:   Intake/Output Summary (Last 24 hours) at 01/01/16 1953 Last data filed at 01/01/16 1800  Gross per 24 hour  Intake    510 ml  Output   2900 ml    Net  -2390 ml     Exam: General: Positive  acute on Chronic respiratory distress; but improving now down to 4.5 L O2 from 6 L at admission Eyes: Negative headache, double vision,negative scleral hemorrhage ENT: Negative Runny nose, negative gingival bleeding Neck:  Negative scars, masses, torticollis, lymphadenopathy, JVD Lungs:Decreased Breath sounds LLL, negative wheezes,negative crackles Cardiovascular: Regular rate and rhythm without murmur gallop or rub normal S1 and S2 Abdomen:negative abdominal pain, nondistended, RLQ Hernia (reducable),  positive soft, bowel sounds, no rebound, no ascites, no appreciable mass Extremities: No significant cyanosis, clubbing, TED hose on bilateral  Psychiatric:  Negative depression, negative anxiety, negative fatigue, negative mania  Neurologic:  Cranial nerves II through XII intact, tongue/uvula midline, all extremities muscle strength 5/5, sensation intact throughout,  negative dysarthria, negative expressive aphasia, negative receptive aphasia.   Data Reviewed: Basic Metabolic Panel:  Recent Labs Lab 12/28/15 1230 12/28/15 1942 12/29/15 0225 12/30/15 0245 01/01/16 0244  NA 136  --  140 139 138  K 4.1  --  4.4 4.1 4.7  CL 97*  --  103 102 100*  CO2 29  --  GLUCOSE 149*  --  226* 178* 146*  BUN 19  --  CREATININE 1.22 0.99 0.89 0.72 0.72  CALCIUM 9.2  --  9.4 9.6 9.0   Liver Function Tests:  Recent Labs Lab 12/28/15 1230  AST 28  ALT 33  ALKPHOS 98  BILITOT 1.4*  PROT 6.8  ALBUMIN 2.8*   No results for input(s): LIPASE, AMYLASE in the last 168 hours. No results for input(s): AMMONIA in the last 168 hours. CBC:  Recent Labs Lab 12/28/15 1230 12/28/15 1324 12/28/15 1942 12/29/15 0225 12/30/15 0245 01/01/16 0244  WBC 11.9*  --  10.1 8.4 10.2 6.6  NEUTROABS  --  8.8*  --   --   --   --   HGB 11.8*  --  11.0* 10.7* 11.4* 11.8*  HCT 36.9*  --  34.6* 34.0* 35.5* 36.7*  MCV 95.1  --  95.6 95.8 94.9  93.6  PLT 183  --  176 178 196 214   Cardiac Enzymes:  Recent Labs Lab 12/30/15 1535 12/30/15 2128 12/31/15 0310  TROPONINI 0.05* <0.03 <0.03   BNP (last 3 results)  Recent Labs  12/28/15 1230  BNP 86.1    ProBNP (last 3 results) No results for input(s): PROBNP in the last 8760 hours.  CBG:  Recent Labs Lab 12/31/15 1711 12/31/15 2112 01/01/16 0723 01/01/16 1220 01/01/16 1809  GLUCAP 123* 159* 145* 193* 133*    Recent Results (from the past 240 hour(s))  Blood culture (routine x 2)     Status: None (Preliminary result)   Collection Time: 12/28/15  2:05 PM  Result Value Ref Range Status   Specimen Description BLOOD BLOOD RIGHT FOREARM  Final   Special Requests BOTTLES DRAWN AEROBIC AND ANAEROBIC 5CC  Final   Culture NO GROWTH 4 DAYS  Final   Report Status PENDING  Incomplete  Blood culture (routine x 2)     Status: None (Preliminary result)   Collection Time: 12/28/15  2:20 PM  Result Value Ref Range Status   Specimen Description BLOOD LEFT HAND  Final   Special Requests BOTTLES DRAWN AEROBIC AND ANAEROBIC 5CC  Final   Culture NO GROWTH 4 DAYS  Final   Report Status PENDING  Incomplete  MRSA PCR Screening     Status: None   Collection Time: 12/28/15 10:00 PM  Result Value Ref Range Status   MRSA by PCR NEGATIVE NEGATIVE Final    Comment:        The GeneXpert MRSA Assay (FDA approved for NASAL specimens only), is one component of a comprehensive MRSA colonization surveillance program. It is not intended to diagnose MRSA infection nor to guide or monitor treatment for MRSA infections.      Studies:  Recent x-ray studies have been reviewed in detail by the Attending Physician  Scheduled Meds:  Scheduled Meds: . atorvastatin  5 mg Oral q1800  . budesonide (PULMICORT) nebulizer solution  0.25 mg Nebulization BID  . DULoxetine  60 mg Oral Daily  . enoxaparin (LOVENOX) injection  50 mg Subcutaneous Q24H  . famotidine  10 mg Oral QHS  .  fluticasone  2 spray Each Nare Daily  . guaiFENesin  1,200 mg Oral BID  . insulin aspart  0-15 Units Subcutaneous TID WC  . insulin aspart  0-5 Units Subcutaneous QHS  . ipratropium-albuterol  3 mL Nebulization QID  . levofloxacin (LEVAQUIN) IV  750 mg Intravenous Q24H  . levothyroxine  200 mcg Oral QAC breakfast  . loratadine  10 mg Oral Daily  . methylPREDNISolone (SOLU-MEDROL) injection  60 mg Intravenous Q12H  . montelukast  10 mg Oral QHS  . psyllium  1 packet Oral Daily  . sucralfate  1 g Oral QID  . tamsulosin  0.4 mg Oral QPC supper  . tiZANidine  4 mg Oral BID    Time spent on care of this patient: 40 mins   Tonetta Napoles, Roselind Messier , MD  [facility] Office  [phone] Pager [phone]  On-Call/Text Page:      [email or web address]      password [email or web address]  If 7PM-7AM, please contact night-coverage [email or web address] Password [email or web address] 01/01/2016, 7:53 PM   LOS: 4 days   Care during the described time interval was provided by me .  I have reviewed this patient's available data, including medical history, events of note, physical examination, and all test results as part of my evaluation. I have personally reviewed and interpreted all radiology studies.   Carolyne Littles, MD [phone] Pager

## 2016-01-01 NOTE — Telephone Encounter (Signed)
Spoke with pt's daughter, Morrie Sheldon. States that the pt is currently admitted at [facility] in [place]. He is being treated for bilateral pneumonia. Pt and his family have asked several times to have one of our doctors to come by and see him. They were told very rudely by this treating MD, that there's nothing that our doctors can do that he isn't already doing. Pt is still requesting to have a pulmonary doctor come by.  They would like VS recommendations.  VS - please advise. Thanks.

## 2016-01-01 NOTE — Telephone Encounter (Signed)
I have sent message to hospitalist caring for [name] asking him to consult pulmonary if necessary.

## 2016-01-02 ENCOUNTER — Inpatient Hospital Stay (HOSPITAL_COMMUNITY): Payer: Medicare Other

## 2016-01-02 LAB — GLUCOSE, CAPILLARY
GLUCOSE-CAPILLARY: 144 mg/dL — AB (ref 65–99)
Glucose-Capillary: 133 mg/dL — ABNORMAL HIGH (ref 65–99)
Glucose-Capillary: 142 mg/dL — ABNORMAL HIGH (ref 65–99)
Glucose-Capillary: 139 mg/dL — ABNORMAL HIGH (ref 65–99)

## 2016-01-02 LAB — CULTURE, BLOOD (ROUTINE X 2)
CULTURE: NO GROWTH
Culture: NO GROWTH

## 2016-01-02 MED ORDER — FUROSEMIDE 10 MG/ML IJ SOLN
40.0000 mg | Freq: Two times a day (BID) | INTRAMUSCULAR | Status: AC
  Administered 2016-01-02 – 2016-01-03 (×2): 40 mg via INTRAVENOUS
  Filled 2016-01-02 (×2): qty 4

## 2016-01-02 MED ORDER — ALBUTEROL SULFATE (2.5 MG/3ML) 0.083% IN NEBU: 2.5000 mg | INHALATION_SOLUTION | RESPIRATORY_TRACT | Status: DC | PRN

## 2016-01-02 MED ORDER — PREDNISONE 20 MG PO TABS
40.0000 mg | ORAL_TABLET | Freq: Two times a day (BID) | ORAL | Status: DC
  Administered 2016-01-03 – 2016-01-04 (×3): 40 mg via ORAL
  Filled 2016-01-02: qty 4
  Filled 2016-01-02 (×2): qty 2

## 2016-01-02 NOTE — Progress Notes (Signed)
Physical Therapy Treatment Patient Details Name: [name] MRN: [id number] DOB: 03-24-1951 Today's Date: 01/02/2016    History of Present Illness 64yo man with hx of severe COPD on home oxygen, anxiety, hypothyroidism, HTN, HLD, OA, BPH, and chronic shoulder pain who presented w/ worsening SOB, wheezing, and increased cough with change in sputum and increased sleepiness per his wife. Further symptoms included pleuritic chest pain on the right.    PT Comments    Patient tolerated ambulation of increased distance today using RW and supplemental O2 at 4 liters. Patient encouraged to coordinate breath and stride in addition to performing pursed lip breathing throughout activity. Patient and spouse educated extensively regarding energy conservation, pt very receptive. Will continue to see and progress as tolerated. O2 saturations 96% at rest, decreased to 88% with extended distance rebound within 30 seconds   Follow Up Recommendations  Home health PT;Supervision/Assistance - 24 hour     Equipment Recommendations  Wheelchair (measurements PT);Wheelchair cushion (measurements PT);Hospital bed (may need wheelchair for community/hosp bed for pulm issues)    Recommendations for Other Services       Precautions / Restrictions Precautions Precautions: Fall Restrictions Weight Bearing Restrictions: No    Mobility  Bed Mobility               General bed mobility comments: Received up in chair  Transfers Overall transfer level: Needs assistance Equipment used: Rolling walker (2 wheeled) Transfers: Sit to/from Stand Sit to Stand: Supervision         General transfer comment: VCs for hand placement x2  Ambulation/Gait Ambulation/Gait assistance: Min guard (close supervision) Ambulation Distance (Feet): 90 Feet (x2 with extended seated rest break and cues for PLB) Assistive device: Rolling walker (2 wheeled);None Gait Pattern/deviations: Step-through pattern;Decreased stride  length;Trunk flexed;Wide base of support Gait velocity: decreased Gait velocity interpretation: Below normal speed for age/gender General Gait Details: Pt was able to ambulate into bathroom with rW with steady gait but did need cues to stay close to RW.  Once in bathroom, pt needed assist to get urinal placed so he could urinate.  Pt maintained balance to urinate with wide BOS but did have to hand the urinal to PT then needing HHA to step over to toilet so he could have BM.  Once pt finished, he stood with min assist using rail beside toilet and did not use the RW to walk back to chair but did use HHA of PT to get back to chair for steadying.     Stairs            Wheelchair Mobility    Modified Rankin (Stroke Patients Only)       Balance   Sitting-balance support: No upper extremity supported Sitting balance-Leahy Scale: Fair       Standing balance-Leahy Scale: Fair Standing balance comment: able to stand without UE support                     Cognition Arousal/Alertness: Awake/alert Behavior During Therapy: WFL for tasks assessed/performed Overall Cognitive Status: Within Functional Limits for tasks assessed                      Exercises General Exercises - Lower Extremity Ankle Circles/Pumps: AROM;Both;10 reps;Seated    General Comments General comments (skin integrity, edema, etc.): ambulated on 4 liters [name], patient with tendeny to mouth breath, educated on importance of PLB and sequenced breathing with stride. Patient and spouse educated extensively on energy conseration  techniques during mobility as well as daily management.       Pertinent Vitals/Pain Pain Assessment: No/denies pain    Home Living                      Prior Function            PT Goals (current goals can now be found in the care plan section) Acute Rehab PT Goals Patient Stated Goal: to return home PT Goal Formulation: With patient Time For Goal Achievement:  01/12/16 Potential to Achieve Goals: Fair    Frequency  Min 3X/week    PT Plan Current plan remains appropriate    Co-evaluation             End of Session Equipment Utilized During Treatment: Gait belt;Oxygen Activity Tolerance: Patient tolerated treatment well Patient left: in chair;with call bell/phone within reach;with family/visitor present     Time: 0981-1914 PT Time Calculation (min) (ACUTE ONLY): 25 min  Charges:  $Gait Training: 8-22 mins $Self Care/Home Management: 8-22                    G CodesFabio Asa 01-31-2016, 2:33 PM Charlotte Crumb, PT DPT  [phone]

## 2016-01-02 NOTE — Progress Notes (Signed)
Occupational Therapy Treatment Patient Details Name: [name] MRN: [id number] DOB: 05-04-1951 Today's Date: 01/02/2016    History of present illness 64yo man with hx of severe COPD on home oxygen, anxiety, hypothyroidism, HTN, HLD, OA, BPH, and chronic shoulder pain who presented w/ worsening SOB, wheezing, and increased cough with change in sputum and increased sleepiness per his wife. Further symptoms included pleuritic chest pain on the right.   OT comments  Pt demonstrates ability to tolerate oxygen 90% on 6 L with 1 rest break during OT session. Pt able to verbalize 3 EC off handout to help with home set up. Pt and wife concerned with oxygen tanks at home and advised to call back the advance rep that delivered the oxygen. Pt and wife stated they had the number and advised to ask staff here if they were unable to locate paper work.    Follow Up Recommendations  Home health OT;Other (comment) (home aide/ rollator)    Equipment Recommendations  Other (comment);Tub/shower seat;Wheelchair (measurements OT);Wheelchair cushion (measurements OT) (rollator)    Recommendations for Other Services      Precautions / Restrictions Precautions Precautions: Fall Restrictions Weight Bearing Restrictions: No       Mobility Bed Mobility               General bed mobility comments: in chair on arrival  Transfers Overall transfer level: Needs assistance Equipment used: 4-wheeled walker Transfers: Sit to/from Stand Sit to Stand: Min guard         General transfer comment: educated on locking breaks and how to release brakes. pt with good return demo and liked using 4WW. pt educated on use of RW at home for bathroom transfers due to narrowed bathroom doors    Balance   Sitting-balance support: No upper extremity supported Sitting balance-Leahy Scale: Fair       Standing balance-Leahy Scale: Fair Standing balance comment: able to stand without UE support                     ADL Overall ADL's : Needs assistance/impaired                         Toilet Transfer: Min guard;Ambulation;RW Toilet Transfer Details (indicate cue type and reason): pt static standing to void bladder due to c/o inability to void if feed are not touching floor. pt educated on adjusting 3n1 to allow for feet to touch teh floor         Functional mobility during ADLs: Min guard;Rolling walker General ADL Comments: Pt and wife educated on [facility] with handout provided. Pt and wife discussion home modifications to help maximize patients care. Pt reports geo mat ordered last session helping with buttock discomfort. Ot provided rollator this session to demo transfers. Pt required rest break and OT input to help with self awareness to HR incr and O2 decr. Pt attempting to talk with DOE 3 out 4. Pt educated to avoiding talking and purse lip breath. Pt rebouding quickly       Vision                     Perception     Praxis      Cognition   Behavior During Therapy:  Hospital for tasks assessed/performed Overall Cognitive Status: Within Functional Limits for tasks assessed  Extremity/Trunk Assessment               Exercises General Exercises - Lower Extremity Ankle Circles/Pumps: AROM;Both;10 reps;Seated   Shoulder Instructions       General Comments      Pertinent Vitals/ Pain       Pain Assessment: No/denies pain  Home Living                                          Prior Functioning/Environment              Frequency Min 2X/week     Progress Toward Goals  OT Goals(current goals can now be found in the care plan section)  Progress towards OT goals: Progressing toward goals  Acute Rehab OT Goals Patient Stated Goal: to return home OT Goal Formulation: With patient/family Time For Goal Achievement: 01/13/16 Potential to Achieve Goals: Good ADL Goals Pt Will Perform Grooming: with  set-up;sitting Pt Will Perform Upper Body Bathing: with set-up;sitting Pt Will Perform Lower Body Bathing: with set-up;with adaptive equipment;sit to/from stand Pt Will Perform Lower Body Dressing: with set-up;sit to/from stand;with adaptive equipment Pt Will Transfer to Toilet: with min guard assist;stand pivot transfer;bedside commode Additional ADL Goal #1: Pt will demonstrate 2 EC techniques during ADL  Plan Discharge plan remains appropriate    Co-evaluation                 End of Session Equipment Utilized During Treatment: Oxygen   Activity Tolerance Patient tolerated treatment well   Patient Left in chair;with call bell/phone within reach;with family/visitor present   Nurse Communication Mobility status;Precautions        Time: 2130-8657 OT Time Calculation (min): 36 min  Charges: OT General Charges $OT Visit: 1 Procedure OT Treatments $Self Care/Home Management : 23-37 mins  Boone Master B 01/02/2016, 2:51 PM   Mateo Flow   OTR/L Pager: [phone] Office: [phone] .

## 2016-01-02 NOTE — Progress Notes (Signed)
[facility] TEAM 1 - Stepdown/ICU TEAM PROGRESS NOTE  [name] XBJ:[id number] DOB: [birth year] DOA: 12/28/2015 PCP: Leo Grosser, MD  Admit HPI / Brief Narrative: 65yo man with hx of severe COPD on home oxygen, anxiety, hypothyroidism, HTN, HLD, OA, BPH, and chronic shoulder pain who presented w/ worsening SOB, wheezing, and increased cough with change in sputum and increased sleepiness per his wife. Further symptoms included pleuritic chest pain on the right.  In the ED, he was awake and alert, ABG showed CO2 was mildly elevated, but O2 was low. He improved greatly with a continuous albuterol nebulizer but continued to have elevated oxygen requirements and needed face tent oxygen. He did not require BIPAP.   HPI/Subjective: The patient is sitting up in a bedside chair.  He looks better than I have seen him during his hospital stay.  He reports severe shortness of breath this morning but states this eventually calmed down.  He denies any chest pain nausea vomiting or abdominal pain at the present time.  Assessment/Plan:  Bibasliar pneumonia Continue empiric antibiotic therapy - CXR already noting some improvement  Acute on Chronic respiratory failure with hypoxia due to COPD  Continue medical treatment for acute COPD exacerbation with supportive oxygen as well - slowly improving - wean oxygen as able - evaluate ambulatory saturations   Grade 1 Diastolic CHF  Attempt to diurese further - presently net negative ~6.5L since admit   AKI - resolved  crt has returned to normal  Hypothyroidism Continue home medical therapy  Essential hypertension Blood pressure beginning to increase - diurese - titrate home medications as able  Anxiety state Well compensated at present  Hyperglycemia A1c not c/w true DM (6.2)  Code Status: FULL Family Communication: Spoke with patient and his wife at bedside at length  Disposition Plan: SDU  Consultants: None   Procedures: 1/18 TTE  - EF 55-60% - no WMA - grade 1 DD   Antibiotics: Levaquin 1/15 > Vancomycin 1/15  DVT prophylaxis: Lovenox  Objective: Blood pressure 142/84, pulse 80, temperature 97.8 F (36.6 C), temperature source Axillary, resp. rate 20, height  (1.626 m), weight 107.457 kg (236 lb 14.4 oz), SpO2 93 %.  Intake/Output Summary (Last 24 hours) at 01/02/16 1104 Last data filed at 01/02/16 0100  Gross per 24 hour  Intake    750 ml  Output   2575 ml  Net  -1825 ml   Exam: General: no acute resp distress  Lungs: Poor air movement throughout - no wheezing appreciable today - mild bibasilar crackles  Cardiovascular: Regular rate and rhythm - no murmur  Abdomen: Nontender, nondistended, soft, bowel sounds positive, no rebound, no ascites Extremities: No cyanosis, or clubbing;  1+ edema bilateral lower extremities  Data Reviewed:  Basic Metabolic Panel:  Recent Labs Lab 12/28/15 1230 12/28/15 1942 12/29/15 0225 12/30/15 0245 01/01/16 0244  NA 136  --  140 139 138  K 4.1  --  4.4 4.1 4.7  CL 97*  --  103 102 100*  CO2 29  --  GLUCOSE 149*  --  226* 178* 146*  BUN 19  --  CREATININE 1.22 0.99 0.89 0.72 0.72  CALCIUM 9.2  --  9.4 9.6 9.0    CBC:  Recent Labs Lab 12/28/15 1230 12/28/15 1324 12/28/15 1942 12/29/15 0225 12/30/15 0245 01/01/16 0244  WBC 11.9*  --  10.1 8.4 10.2 6.6  NEUTROABS  --  8.8*  --   --   --   --  HGB 11.8*  --  11.0* 10.7* 11.4* 11.8*  HCT 36.9*  --  34.6* 34.0* 35.5* 36.7*  MCV 95.1  --  95.6 95.8 94.9 93.6  PLT 183  --  176 178 196 214    Liver Function Tests:  Recent Labs Lab 12/28/15 1230  AST 28  ALT 33  ALKPHOS 98  BILITOT 1.4*  PROT 6.8  ALBUMIN 2.8*     Recent Results (from the past 240 hour(s))  Blood culture (routine x 2)     Status: None (Preliminary result)   Collection Time: 12/28/15  2:05 PM  Result Value Ref Range Status   Specimen Description BLOOD BLOOD RIGHT FOREARM  Final   Special Requests  BOTTLES DRAWN AEROBIC AND ANAEROBIC 5CC  Final   Culture NO GROWTH 4 DAYS  Final   Report Status PENDING  Incomplete  Blood culture (routine x 2)     Status: None (Preliminary result)   Collection Time: 12/28/15  2:20 PM  Result Value Ref Range Status   Specimen Description BLOOD LEFT HAND  Final   Special Requests BOTTLES DRAWN AEROBIC AND ANAEROBIC 5CC  Final   Culture NO GROWTH 4 DAYS  Final   Report Status PENDING  Incomplete  MRSA PCR Screening     Status: None   Collection Time: 12/28/15 10:00 PM  Result Value Ref Range Status   MRSA by PCR NEGATIVE NEGATIVE Final    Comment:        The GeneXpert MRSA Assay (FDA approved for NASAL specimens only), is one component of a comprehensive MRSA colonization surveillance program. It is not intended to diagnose MRSA infection nor to guide or monitor treatment for MRSA infections.   Respiratory virus panel     Status: None   Collection Time: 12/30/15  3:22 PM  Result Value Ref Range Status   Respiratory Syncytial Virus A Negative Negative Final   Respiratory Syncytial Virus B Negative Negative Final   Influenza A Negative Negative Final   Influenza B Negative Negative Final   Parainfluenza 1 Negative Negative Final   Parainfluenza 2 Negative Negative Final   Parainfluenza 3 Negative Negative Final   Metapneumovirus Negative Negative Final   Rhinovirus Negative Negative Final   Adenovirus Negative Negative Final    Comment: (NOTE) Performed At: [facility] [address] [place], [place] [id number] Mila Homer MD XB:[id number]      Studies:   Recent x-ray studies have been reviewed in detail by the Attending Physician  Scheduled Meds:  Scheduled Meds: . atorvastatin  5 mg Oral q1800  . budesonide (PULMICORT) nebulizer solution  0.25 mg Nebulization BID  . DULoxetine  60 mg Oral Daily  . enoxaparin (LOVENOX) injection  50 mg Subcutaneous Q24H  . famotidine  10 mg Oral QHS  . fluticasone  2 spray Each  Nare Daily  . guaiFENesin  1,200 mg Oral BID  . insulin aspart  0-15 Units Subcutaneous TID WC  . insulin aspart  0-5 Units Subcutaneous QHS  . ipratropium-albuterol  3 mL Nebulization QID  . levofloxacin (LEVAQUIN) IV  750 mg Intravenous Q24H  . levothyroxine  200 mcg Oral QAC breakfast  . loratadine  10 mg Oral Daily  . methylPREDNISolone (SOLU-MEDROL) injection  60 mg Intravenous Q12H  . montelukast  10 mg Oral QHS  . psyllium  1 packet Oral Daily  . sucralfate  1 g Oral QID  . tamsulosin  0.4 mg Oral QPC supper  . tiZANidine  4 mg Oral  BID    Time spent on care of this patient: 35 mins   [facility] T , MD   [facility] Office  [phone] Pager - Text Page per Loretha Stapler as per below:  On-Call/Text Page:      [email or web address]      password [email or web address]  If 7PM-7AM, please contact night-coverage [email or web address] Password [email or web address] 01/02/2016, 11:04 AM   LOS: 5 days

## 2016-01-03 LAB — GLUCOSE, CAPILLARY
GLUCOSE-CAPILLARY: 118 mg/dL — AB (ref 65–99)
GLUCOSE-CAPILLARY: 183 mg/dL — AB (ref 65–99)
Glucose-Capillary: 86 mg/dL (ref 65–99)
Glucose-Capillary: 179 mg/dL — ABNORMAL HIGH (ref 65–99)

## 2016-01-03 LAB — BASIC METABOLIC PANEL
Anion gap: 8 (ref 5–15)
BUN: 20 mg/dL (ref 6–20)
CALCIUM: 9 mg/dL (ref 8.9–10.3)
CHLORIDE: 99 mmol/L — AB (ref 101–111)
CO2: 30 mmol/L (ref 22–32)
CREATININE: 0.76 mg/dL (ref 0.61–1.24)
GFR calc Af Amer: >60 mL/min (ref >60)
GFR calc non Af Amer: >60 mL/min (ref >60)
GLUCOSE: 140 mg/dL — AB (ref 65–99)
Potassium: 4.9 mmol/L (ref 3.5–5.1)
Sodium: 137 mmol/L (ref 135–145)

## 2016-01-03 MED ORDER — FUROSEMIDE 40 MG PO TABS
40.0000 mg | ORAL_TABLET | Freq: Two times a day (BID) | ORAL | Status: DC
  Administered 2016-01-03 – 2016-01-04 (×2): 40 mg via ORAL
  Filled 2016-01-03 (×2): qty 1

## 2016-01-03 MED ORDER — BUDESONIDE-FORMOTEROL FUMARATE 160-4.5 MCG/ACT IN AERO
2.0000 | INHALATION_SPRAY | Freq: Two times a day (BID) | RESPIRATORY_TRACT | Status: DC
  Administered 2016-01-04: 2 via RESPIRATORY_TRACT
  Filled 2016-01-03: qty 6

## 2016-01-03 MED ORDER — TIOTROPIUM BROMIDE MONOHYDRATE 18 MCG IN CAPS
18.0000 ug | ORAL_CAPSULE | Freq: Every day | RESPIRATORY_TRACT | Status: DC
Start: 2016-01-04 — End: 2016-01-04
  Administered 2016-01-04: 18 ug via RESPIRATORY_TRACT
  Filled 2016-01-03 (×2): qty 5

## 2016-01-03 NOTE — Progress Notes (Signed)
Patient not wearing BiPAP Qhs. No machine in room. Will monitor for need

## 2016-01-03 NOTE — Progress Notes (Signed)
Pt's wife called and aware of pt's transfer to 5w20.  Report called to [facility].  Karena Addison T

## 2016-01-03 NOTE — Progress Notes (Signed)
[place] TEAM 1 - Stepdown/ICU TEAM PROGRESS NOTE  [name] ZOX:[id number] DOB: 19-Nov-1951 DOA: 12/28/2015 PCP: Leo Grosser, MD  Admit HPI / Brief Narrative: 65yo man with hx of severe COPD on home oxygen, anxiety, hypothyroidism, HTN, HLD, OA, BPH, and chronic shoulder pain who presented w/ worsening SOB, wheezing, and increased cough with change in sputum and increased sleepiness per his wife. Further symptoms included pleuritic chest pain on the right.  In the ED, he was awake and alert, ABG showed CO2 was mildly elevated, but O2 was low. He improved greatly with a continuous albuterol nebulizer but continued to have elevated oxygen requirements and needed face tent oxygen. He did not require BIPAP.   HPI/Subjective: The patient is quite anxious and upset this morning because he cannot get his albuterol anytime that he wants it.  He asked for it last night and when respiratory therapy came to see him felt that he could get by without a nebulizer treatment.  I explained the reasoning behind this to him and attempted to encourage him to avoid using prn nebulizers every 2 hours unless he is actually wheezing severely or having significant shortness of breath.  Anxiety is certainly contributing to his chronic respiratory issues.  Otherwise he admits that he is feeling much better in general.  He denies chest pain nausea vomiting or abdominal pain.  Assessment/Plan:  Bibasliar pneumonia Will complete 7 days of empiric antibiotic therapy today - CXR already noting some improvement  Acute on Chronic respiratory failure with hypoxia due to COPD  Continue medical treatment for acute COPD exacerbation with supportive oxygen as well - slowly improving - continue to wean oxygen as able - evaluate ambulatory saturations - continue to work with PT/OT  Grade 1 Diastolic CHF  Continue to diurese further - net negative ~9L since admit - admit weight 108.4 kg - currently 106.5 kg  AKI -  resolved  crt has returned to normal  Hypothyroidism Continue home medical therapy  Essential hypertension Blood pressure well controlled at present  Anxiety state A clear contributor to his symptom complex - continue home dose of Ativan when necessary  Hyperglycemia A1c not c/w true DM (6.2)  Code Status: FULL Family Communication: Spoke with patient and his wife at bedside at length again today Disposition Plan: Transfer to medical bed - ambulate - probable discharge 24-48 hours  Consultants: None   Procedures: 1/18 TTE - EF 55-60% - no WMA - grade 1 DD   Antibiotics: Levaquin 1/15 > 1/21 Vancomycin 1/15  DVT prophylaxis: Lovenox  Objective: Blood pressure 132/87, pulse 92, temperature 98.3 F (36.8 C), temperature source Oral, resp. rate 26, height  (1.626 m), weight 106.5 kg (234 lb 12.6 oz), SpO2 92 %.  Intake/Output Summary (Last 24 hours) at 01/03/16 1003 Last data filed at 01/03/16 0800  Gross per 24 hour  Intake    870 ml  Output   2800 ml  Net  -1930 ml   Exam: General: no acute resp distress - anxious - frustrated  Lungs: distant breath sounds - no wheezing appreciable - mild bibasilar crackles  Cardiovascular: RRR - no murmur  Abdomen: Nontender, nondistended, soft, bowel sounds positive, no rebound, no ascites Extremities: No cyanosis, or clubbing;  1+ edema bilateral lower extremities persists   Data Reviewed:  Basic Metabolic Panel:  Recent Labs Lab 12/28/15 1230 12/28/15 1942 12/29/15 0225 12/30/15 0245 01/01/16 0244 01/03/16 0306  NA 136  --  140 139 138 137  K 4.1  --  4.4 4.1 4.7 4.9  CL 97*  --  103 102 100* 99*  CO2 29  --  GLUCOSE 149*  --  226* 178* 146* 140*  BUN 19  --  CREATININE 1.22 0.99 0.89 0.72 0.72 0.76  CALCIUM 9.2  --  9.4 9.6 9.0 9.0    CBC:  Recent Labs Lab 12/28/15 1230 12/28/15 1324 12/28/15 1942 12/29/15 0225 12/30/15 0245 01/01/16 0244  WBC 11.9*  --  10.1 8.4 10.2  6.6  NEUTROABS  --  8.8*  --   --   --   --   HGB 11.8*  --  11.0* 10.7* 11.4* 11.8*  HCT 36.9*  --  34.6* 34.0* 35.5* 36.7*  MCV 95.1  --  95.6 95.8 94.9 93.6  PLT 183  --  176 178 196 214    Liver Function Tests:  Recent Labs Lab 12/28/15 1230  AST 28  ALT 33  ALKPHOS 98  BILITOT 1.4*  PROT 6.8  ALBUMIN 2.8*     Recent Results (from the past 240 hour(s))  Blood culture (routine x 2)     Status: None   Collection Time: 12/28/15  2:05 PM  Result Value Ref Range Status   Specimen Description BLOOD BLOOD RIGHT FOREARM  Final   Special Requests BOTTLES DRAWN AEROBIC AND ANAEROBIC 5CC  Final   Culture NO GROWTH 5 DAYS  Final   Report Status 01/02/2016 FINAL  Final  Blood culture (routine x 2)     Status: None   Collection Time: 12/28/15  2:20 PM  Result Value Ref Range Status   Specimen Description BLOOD LEFT HAND  Final   Special Requests BOTTLES DRAWN AEROBIC AND ANAEROBIC 5CC  Final   Culture NO GROWTH 5 DAYS  Final   Report Status 01/02/2016 FINAL  Final  MRSA PCR Screening     Status: None   Collection Time: 12/28/15 10:00 PM  Result Value Ref Range Status   MRSA by PCR NEGATIVE NEGATIVE Final    Comment:        The GeneXpert MRSA Assay (FDA approved for NASAL specimens only), is one component of a comprehensive MRSA colonization surveillance program. It is not intended to diagnose MRSA infection nor to guide or monitor treatment for MRSA infections.   Respiratory virus panel     Status: None   Collection Time: 12/30/15  3:22 PM  Result Value Ref Range Status   Respiratory Syncytial Virus A Negative Negative Final   Respiratory Syncytial Virus B Negative Negative Final   Influenza A Negative Negative Final   Influenza B Negative Negative Final   Parainfluenza 1 Negative Negative Final   Parainfluenza 2 Negative Negative Final   Parainfluenza 3 Negative Negative Final   Metapneumovirus Negative Negative Final   Rhinovirus Negative Negative Final    Adenovirus Negative Negative Final    Comment: (NOTE) Performed At: [facility] [address] [place], [place] [id number] Mila Homer MD WU:[id number]      Studies:   Recent x-ray studies have been reviewed in detail by the Attending Physician  Scheduled Meds:  Scheduled Meds: . atorvastatin  5 mg Oral q1800  . budesonide (PULMICORT) nebulizer solution  0.25 mg Nebulization BID  . DULoxetine  60 mg Oral Daily  . enoxaparin (LOVENOX) injection  50 mg Subcutaneous Q24H  . famotidine  10 mg Oral QHS  . fluticasone  2 spray Each Nare Daily  . guaiFENesin  1,200 mg Oral BID  . insulin aspart  0-15 Units Subcutaneous TID WC  . insulin aspart  0-5 Units Subcutaneous QHS  . ipratropium-albuterol  3 mL Nebulization QID  . levofloxacin (LEVAQUIN) IV  750 mg Intravenous Q24H  . levothyroxine  200 mcg Oral QAC breakfast  . loratadine  10 mg Oral Daily  . montelukast  10 mg Oral QHS  . predniSONE  40 mg Oral BID WC  . psyllium  1 packet Oral Daily  . sucralfate  1 g Oral QID  . tamsulosin  0.4 mg Oral QPC supper  . tiZANidine  4 mg Oral BID    Time spent on care of this patient: 35 mins   Sanari Offner T , MD   [facility] Office  [phone] Pager - Text Page per Loretha Stapler as per below:  On-Call/Text Page:      [email or web address]      password [email or web address]  If 7PM-7AM, please contact night-coverage [email or web address] Password [email or web address] 01/03/2016, 10:03 AM   LOS: 6 days

## 2016-01-04 LAB — GLUCOSE, CAPILLARY: Glucose-Capillary: 108 mg/dL — ABNORMAL HIGH (ref 65–99)

## 2016-01-04 MED ORDER — FLUTICASONE PROPIONATE 50 MCG/ACT NA SUSP: 2.0000 | Freq: Every day | NASAL | Status: DC

## 2016-01-04 MED ORDER — ALBUTEROL SULFATE HFA 108 (90 BASE) MCG/ACT IN AERS: INHALATION_SPRAY | RESPIRATORY_TRACT | Status: DC

## 2016-01-04 MED ORDER — GUAIFENESIN ER 1200 MG PO TB12: 1.0000 | ORAL_TABLET | Freq: Two times a day (BID) | ORAL | Status: DC | PRN

## 2016-01-04 MED ORDER — PREDNISONE 10 MG PO TABS: ORAL_TABLET | ORAL | Status: DC

## 2016-01-04 NOTE — Progress Notes (Signed)
Patient transferred from [facility]. Alert and oriented x4, 3L O2 set up, and chair alarm set up.  Call light and phone within reach and patient oriented to the unit.

## 2016-01-04 NOTE — Discharge Summary (Addendum)
DISCHARGE SUMMARY  [name]  MR#: [id number]  DOB:02-21-1951  Date of Admission: 12/28/2015 Date of Discharge: 01/04/2016  Attending Physician:Jahni Nazar T  Patient's NWG:NFAOZHY,QMVHQI TOM, MD  Consults: phone discussion w/ Dr. Craige Cotta  Disposition: D/C home   Follow-up Appts:     Follow-up Information    Follow up with [facility] TOM, MD In 14 days.   Specialty:  Family Medicine   Contact information:   [address] [place] [address] [phone]       Follow up with SOOD,VINEET, MD. Schedule an appointment as soon as possible for a visit in 1 week.   Specialty:  Pulmonary Disease   Contact information:   [address] [place] [address] [phone]      Tests Needing Follow-up: -assessment of pulmonary status  -assessment of volume status -assessment of BP control  Discharge Diagnoses: Bibasliar pneumonia Acute on Chronic respiratory failure with hypoxia due to COPD  Acute exacerbation of Chronic Grade 1 Diastolic CHF  AKI - resolved  Hypothyroidism Essential hypertension Anxiety state Hyperglycemia  Initial presentation: 65yo man with hx of severe COPD on home oxygen, anxiety, hypothyroidism, HTN, HLD, OA, BPH, and chronic shoulder pain who presented w/ worsening SOB, wheezing, and increased cough with change in sputum and increased sleepiness per his wife. Further symptoms included pleuritic chest pain on the right.  In the ED, he was awake and alert, ABG showed CO2 was mildly elevated, but O2 was low. He improved greatly with a continuous albuterol nebulizer but continued to have elevated oxygen requirements and needed face tent oxygen. He did not require BIPAP.   Hospital Course:  Bibasliar pneumonia completed 7 days of empiric antibiotic therapy - f/u CXR in hospital already noting some improvement - no clinical s/sx of ongoing infection   Acute on Chronic respiratory failure with hypoxia due to COPD  The  patient's respiratory status has stabilized - he has been weaned to his home maintenance inhaler therapies and counseled extensively on the proper use of maintenance inhalers versus rescue inhalers/nebulizers - he has been weaned to his usual 3 L nasal cannula oxygen at all times - he feels very comfortable and is requesting discharge home - there is no persisting wheeze on physical exam  Acute exacerbation of Chronic Grade 1 Diastolic CHF  net negative ~10.5L since admit - admit weight 108.4 kg - discharge weight  104.7 kg - continue Lasix  AKI - resolved  crt has returned to normal  Hypothyroidism Continue home medical therapy  Essential hypertension Blood pressure well controlled at present  Anxiety state A clear contributor to his symptom complex - continue home dose of Ativan when necessary  Hyperglycemia A1c not c/w true DM (6.2)     Medication List    STOP taking these medications        triamcinolone 55 MCG/ACT nasal inhaler  Commonly known as:  NASACORT  Replaced by:  fluticasone 50 MCG/ACT nasal spray      TAKE these medications        ACAPELLA Misc  Dx 496     AEROCHAMBER Z-STAT PLUS inhaler  Use as instructed     albuterol (2.5 MG/3ML) 0.083% nebulizer solution  Commonly known as:  PROVENTIL  USE 1 VIAL IN NEBULIZER EVERY 4 HOURS AS NEEDED FOR WHEEZING/SHORTNESS OF BREATH     albuterol 108 (90 Base) MCG/ACT inhaler  Commonly known as:  PROAIR HFA  INHALE 2 PUFFS INTO LUNGS EVERY 4 HOURS AS NEEDED for wheezing  when not at home/unable to use albuterol nebulizer - DO NOT USE BOTH THIS INHALER AND YOUR ALBUTEROL NEBULIZER at the same time - they both provide the same medicine     atorvastatin 10 MG tablet  Commonly known as:  LIPITOR  TAKE 0.5 TABLETS (5 MG TOTAL) BY MOUTH DAILY.     clindamycin 150 MG capsule  Commonly known as:  CLEOCIN  4 tabs po x 1 prior to dentist visits     clotrimazole-betamethasone cream  Commonly known as:  LOTRISONE  APPLY  TWICE A DAY FOR 10 TO 14 DAYS AS DIRECTED     DULoxetine 60 MG capsule  Commonly known as:  CYMBALTA  Take 1 capsule by mouth daily.     fluticasone 50 MCG/ACT nasal spray  Commonly known as:  FLONASE  Place 2 sprays into both nostrils daily.     furosemide 40 MG tablet  Commonly known as:  LASIX  TAKE TWO TABLETS BY MOUTH TWICE DAILY     Guaifenesin 1200 MG Tb12  Commonly known as:  MUCINEX MAXIMUM STRENGTH  Take 1 tablet (1,200 mg total) by mouth 2 (two) times daily as needed (as needed for congestion).     HYDROcodone-acetaminophen 10-325 MG tablet  Commonly known as:  NORCO  Take 1 tablet by mouth every 6 (six) hours as needed.     KLOR-CON M20 20 MEQ tablet  Generic drug:  potassium chloride SA  TAKE ONE TABLET BY MOUTH DAILY     levothyroxine 200 MCG tablet  Commonly known as:  SYNTHROID, LEVOTHROID  TAKE 1 TABLET (200 MCG TOTAL) BY MOUTH DAILY.     loratadine 10 MG tablet  Commonly known as:  CLARITIN  Take 10 mg by mouth daily.     LORazepam 0.5 MG tablet  Commonly known as:  ATIVAN  Take 1 tablet (0.5 mg total) by mouth every 6 (six) hours as needed for anxiety.     montelukast 10 MG tablet  Commonly known as:  SINGULAIR  Take 1 tablet (10 mg total) by mouth at bedtime.     predniSONE 10 MG tablet  Commonly known as:  DELTASONE  Take 4 tablets a day on 1/23 and 1/24, then take 3 tablets a day on 1/25 and 1/26, then take 2 tablets a day on 1/27 and 1/28, then take 1 tablet a day on 1/29 and 1/30, then stop     ranitidine 150 MG tablet  Commonly known as:  ZANTAC  Take 150 mg by mouth at bedtime.     sucralfate 1 g tablet  Commonly known as:  CARAFATE  TAKE 1 TABLET FOUR TIMES A DAY     SYMBICORT 160-4.5 MCG/ACT inhaler  Generic drug:  budesonide-formoterol  INHALE 2 PUFFS INTO LUNGS TWICE DAILY     tamsulosin 0.4 MG Caps capsule  Commonly known as:  FLOMAX  TAKE 1 CAPSULE (0.4 MG TOTAL) BY MOUTH DAILY.     tiotropium 18 MCG inhalation capsule    Commonly known as:  SPIRIVA HANDIHALER  PLACE 1 CAPSULE (18 MCG TOTAL) INTO INHALER AND INHALE DAILY.     tiZANidine 4 MG capsule  Commonly known as:  ZANAFLEX  Take 4 mg by mouth 2 (two) times daily.     valsartan 160 MG tablet  Commonly known as:  DIOVAN  TAKE 1 TABLET (160 MG TOTAL) BY MOUTH DAILY.        Day of Discharge BP 144/81 mmHg  Pulse 69  Temp(Src) 97.7 F (36.5  C) (Oral)  Resp 20  Ht 5\' 4"  (1.626 m)  Wt 104.781 kg (231 lb)  BMI 39.63 kg/m2  SpO2 96%  Physical Exam: General: No acute respiratory distress - comfortable on 3L even w/ ambulation  Lungs: Clear to auscultation bilaterally without wheezes - distant bs th/o  Cardiovascular: Regular rate and rhythm without murmur gallop or rub normal S1 and S2 Abdomen: Nontender, nondistended, soft, bowel sounds positive, no rebound, no ascites, no appreciable mass Extremities: No significant cyanosis, clubbing, or edema bilateral lower extremities  Basic Metabolic Panel:  Recent Labs Lab 12/28/15 1230 12/28/15 1942 12/29/15 0225 12/30/15 0245 01/01/16 0244 01/03/16 0306  NA 136  --  140 139 138 137  K 4.1  --  4.4 4.1 4.7 4.9  CL 97*  --  103 102 100* 99*  CO2 29  --  28 26 28 30   GLUCOSE 149*  --  226* 178* 146* 140*  BUN 19  --  16 15 16 20   CREATININE 1.22 0.99 0.89 0.72 0.72 0.76  CALCIUM 9.2  --  9.4 9.6 9.0 9.0    Liver Function Tests:  Recent Labs Lab 12/28/15 1230  AST 28  ALT 33  ALKPHOS 98  BILITOT 1.4*  PROT 6.8  ALBUMIN 2.8*   CBC:  Recent Labs Lab 12/28/15 1230 12/28/15 1324 12/28/15 1942 12/29/15 0225 12/30/15 0245 01/01/16 0244  WBC 11.9*  --  10.1 8.4 10.2 6.6  NEUTROABS  --  8.8*  --   --   --   --   HGB 11.8*  --  11.0* 10.7* 11.4* 11.8*  HCT 36.9*  --  34.6* 34.0* 35.5* 36.7*  MCV 95.1  --  95.6 95.8 94.9 93.6  PLT 183  --  176 178 196 214    Cardiac Enzymes:  Recent Labs Lab 12/30/15 1535 12/30/15 2128 12/31/15 0310  TROPONINI 0.05* <0.03 <0.03   BNP  (last 3 results)  Recent Labs  12/28/15 1230  BNP 86.1    CBG:  Recent Labs Lab 01/03/16 0837 01/03/16 1314 01/03/16 1712 01/03/16 2128 01/04/16 0818  GLUCAP 86 179* 118* 183* 108*    Recent Results (from the past 240 hour(s))  Blood culture (routine x 2)     Status: None   Collection Time: 12/28/15  2:05 PM  Result Value Ref Range Status   Specimen Description BLOOD BLOOD RIGHT FOREARM  Final   Special Requests BOTTLES DRAWN AEROBIC AND ANAEROBIC 5CC  Final   Culture NO GROWTH 5 DAYS  Final   Report Status 01/02/2016 FINAL  Final  Blood culture (routine x 2)     Status: None   Collection Time: 12/28/15  2:20 PM  Result Value Ref Range Status   Specimen Description BLOOD LEFT HAND  Final   Special Requests BOTTLES DRAWN AEROBIC AND ANAEROBIC 5CC  Final   Culture NO GROWTH 5 DAYS  Final   Report Status 01/02/2016 FINAL  Final  MRSA PCR Screening     Status: None   Collection Time: 12/28/15 10:00 PM  Result Value Ref Range Status   MRSA by PCR NEGATIVE NEGATIVE Final    Comment:        The GeneXpert MRSA Assay (FDA approved for NASAL specimens only), is one component of a comprehensive MRSA colonization surveillance program. It is not intended to diagnose MRSA infection nor to guide or monitor treatment for MRSA infections.   Respiratory virus panel     Status: None   Collection Time: 12/30/15  3:22 PM  Result Value Ref Range Status   Respiratory Syncytial Virus A Negative Negative Final   Respiratory Syncytial Virus B Negative Negative Final   Influenza A Negative Negative Final   Influenza B Negative Negative Final   Parainfluenza 1 Negative Negative Final   Parainfluenza 2 Negative Negative Final   Parainfluenza 3 Negative Negative Final   Metapneumovirus Negative Negative Final   Rhinovirus Negative Negative Final   Adenovirus Negative Negative Final    Comment: (NOTE) Performed At: [facility] [address] [place], [place]  [id number] Mila Homer MD XB:[id number]     Time spent in discharge (includes decision making & examination of pt): >35 minutes  01/04/2016, 9:22 AM   Lonia Blood, MD [facility] Office  [phone] Pager [phone]  On-Call/Text Page:      [email or web address]      password [facility]

## 2016-01-04 NOTE — Discharge Instructions (Signed)
Community-Acquired Pneumonia, Adult °Pneumonia is an infection of the lungs. There are different types of pneumonia. One type can develop while a person is in a hospital. A different type, called community-acquired pneumonia, develops in people who are not, or have not recently been, in the hospital or other health care facility.  °CAUSES °Pneumonia may be caused by bacteria, viruses, or funguses. Community-acquired pneumonia is often caused by Streptococcus pneumonia bacteria. These bacteria are often passed from one person to another by breathing in droplets from the cough or sneeze of an infected person. °RISK FACTORS °The condition is more likely to develop in: °· People who have chronic diseases, such as chronic obstructive pulmonary disease (COPD), asthma, congestive heart failure, cystic fibrosis, diabetes, or kidney disease. °· People who have early-stage or late-stage HIV. °· People who have sickle cell disease. °· People who have had their spleen removed (splenectomy). °· People who have poor dental hygiene. °· People who have medical conditions that increase the risk of breathing in (aspirating) secretions their own mouth and nose.   °· People who have a weakened immune system (immunocompromised). °· People who smoke. °· People who travel to areas where pneumonia-causing germs commonly exist. °· People who are around animal habitats or animals that have pneumonia-causing germs, including birds, bats, rabbits, cats, and farm animals. °SYMPTOMS °Symptoms of this condition include: °· A dry cough. °· A wet (productive) cough. °· Fever. °· Sweating. °· Chest pain, especially when breathing deeply or coughing. °· Rapid breathing or difficulty breathing. °· Shortness of breath. °· Shaking chills. °· Fatigue. °· Muscle aches. °DIAGNOSIS °Your health care provider will take a medical history and perform a physical exam. You may also have other tests, including: °· Imaging studies of your chest, including  X-rays. °· Tests to check your blood oxygen level and other blood gases. °· Other tests on blood, mucus (sputum), fluid around your lungs (pleural fluid), and urine. °If your pneumonia is severe, other tests may be done to identify the specific cause of your illness. °TREATMENT °The type of treatment that you receive depends on many factors, such as the cause of your pneumonia, the medicines you take, and other medical conditions that you have. For most adults, treatment and recovery from pneumonia may occur at home. In some cases, treatment must happen in a hospital. Treatment may include: °· Antibiotic medicines, if the pneumonia was caused by bacteria. °· Antiviral medicines, if the pneumonia was caused by a virus. °· Medicines that are given by mouth or through an IV tube. °· Oxygen. °· Respiratory therapy. °Although rare, treating severe pneumonia may include: °· Mechanical ventilation. This is done if you are not breathing well on your own and you cannot maintain a safe blood oxygen level. °· Thoracentesis. This procedure removes fluid around one lung or both lungs to help you breathe better. °HOME CARE INSTRUCTIONS °1. Take over-the-counter and prescription medicines only as told by your health care provider. °1. Only take cough medicine if you are losing sleep. Understand that cough medicine can prevent your body's natural ability to remove mucus from your lungs. °2. If you were prescribed an antibiotic medicine, take it as told by your health care provider. Do not stop taking the antibiotic even if you start to feel better. °2. Sleep in a semi-upright position at night. Try sleeping in a reclining chair, or place a few pillows under your head. °3. Do not use tobacco products, including cigarettes, chewing tobacco, and e-cigarettes. If you need help quitting, ask your health care provider. °4. Drink enough water to keep your urine   clear or pale yellow. This will help to thin out mucus secretions in your  lungs. °PREVENTION °There are ways that you can decrease your risk of developing community-acquired pneumonia. Consider getting a pneumococcal vaccine if: °· You are older than 65 years of age. °· You are older than 64 years of age and are undergoing cancer treatment, have chronic lung disease, or have other medical conditions that affect your immune system. Ask your health care provider if this applies to you. °There are different types and schedules of pneumococcal vaccines. Ask your health care provider which vaccination option is best for you. °You may also prevent community-acquired pneumonia if you take these actions: °· Get an influenza vaccine every year. Ask your health care provider which type of influenza vaccine is best for you. °· Go to the dentist on a regular basis. °· Wash your hands often. Use hand sanitizer if soap and water are not available. °SEEK MEDICAL CARE IF: °· You have a fever. °· You are losing sleep because you cannot control your cough with cough medicine. °SEEK IMMEDIATE MEDICAL CARE IF: °· You have worsening shortness of breath. °· You have increased chest pain. °· Your sickness becomes worse, especially if you are an older adult or have a weakened immune system. °· You cough up blood. °  °This information is not intended to replace advice given to you by your health care provider. Make sure you discuss any questions you have with your health care provider. °  °Document Released: 11/29/2005 Document Revised: 08/20/2015 Document Reviewed: 03/26/2015 °Elsevier Interactive Patient Education ©2016 [employer] °Chronic Obstructive Pulmonary Disease °Chronic obstructive pulmonary disease (COPD) is a common lung condition in which airflow from the lungs is limited. COPD is a general term that can be used to describe many different lung problems that limit airflow, including both chronic bronchitis and emphysema. If you have COPD, your lung function will probably never return to normal, but  there are measures you can take to improve lung function and make yourself feel better. °CAUSES  °· Smoking (common). °· Exposure to secondhand smoke. °· Genetic problems. °· Chronic inflammatory lung diseases or recurrent infections. °SYMPTOMS °· Shortness of breath, especially with physical activity. °· Deep, persistent (chronic) cough with a large amount of thick mucus. °· Wheezing. °· Rapid breaths (tachypnea). °· Gray or bluish discoloration (cyanosis) of the skin, especially in your fingers, toes, or lips. °· Fatigue. °· Weight loss. °· Frequent infections or episodes when breathing symptoms become much worse (exacerbations). °· Chest tightness. °DIAGNOSIS °Your health care provider will take a medical history and perform a physical examination to diagnose COPD. Additional tests for COPD may include: °· Lung (pulmonary) function tests. °· Chest X-ray. °· CT scan. °· Blood tests. °TREATMENT  °Treatment for COPD may include: °· Inhaler and nebulizer medicines. These help manage the symptoms of COPD and make your breathing more comfortable. °· Supplemental oxygen. Supplemental oxygen is only helpful if you have a low oxygen level in your blood. °· Exercise and physical activity. These are beneficial for nearly all people with COPD. °· Lung surgery or transplant. °· Nutrition therapy to gain weight, if you are underweight. °· Pulmonary rehabilitation. This may involve working with a team of health care providers and specialists, such as respiratory, occupational, and physical therapists. °HOME CARE INSTRUCTIONS °· Take all medicines (inhaled or pills) as directed by your health care provider. °· Avoid over-the-counter medicines or cough syrups that dry up your airway (such as antihistamines) and slow   down the elimination of secretions unless instructed otherwise by your health care provider. °· If you are a smoker, the most important thing that you can do is stop smoking. Continuing to smoke will cause further  lung damage and breathing trouble. Ask your health care provider for help with quitting smoking. He or she can direct you to community resources or hospitals that provide support. °· Avoid exposure to irritants such as smoke, chemicals, and fumes that aggravate your breathing. °· Use oxygen therapy and pulmonary rehabilitation if directed by your health care provider. If you require home oxygen therapy, ask your health care provider whether you should purchase a pulse oximeter to measure your oxygen level at home. °· Avoid contact with individuals who have a contagious illness. °· Avoid extreme temperature and humidity changes. °· Eat healthy foods. Eating smaller, more frequent meals and resting before meals may help you maintain your strength. °· Stay active, but balance activity with periods of rest. Exercise and physical activity will help you maintain your ability to do things you want to do. °· Preventing infection and hospitalization is very important when you have COPD. Make sure to receive all the vaccines your health care provider recommends, especially the pneumococcal and influenza vaccines. Ask your health care provider whether you need a pneumonia vaccine. °· Learn and use relaxation techniques to manage stress. °· Learn and use controlled breathing techniques as directed by your health care provider. Controlled breathing techniques include: °¨ Pursed lip breathing. Start by breathing in (inhaling) through your nose for 1 second. Then, purse your lips as if you were going to whistle and breathe out (exhale) through the pursed lips for 2 seconds. °¨ Diaphragmatic breathing. Start by putting one hand on your abdomen just above your waist. Inhale slowly through your nose. The hand on your abdomen should move out. Then purse your lips and exhale slowly. You should be able to feel the hand on your abdomen moving in as you exhale. °· Learn and use controlled coughing to clear mucus from your lungs.  Controlled coughing is a series of short, progressive coughs. The steps of controlled coughing are: °5. Lean your head slightly forward. °6. Breathe in deeply using diaphragmatic breathing. °7. Try to hold your breath for 3 seconds. °8. Keep your mouth slightly open while coughing twice. °9. Spit any mucus out into a tissue. °10. Rest and repeat the steps once or twice as needed. °SEEK MEDICAL CARE IF: °· You are coughing up more mucus than usual. °· There is a change in the color or thickness of your mucus. °· Your breathing is more labored than usual. °· Your breathing is faster than usual. °SEEK IMMEDIATE MEDICAL CARE IF: °· You have shortness of breath while you are resting. °· You have shortness of breath that prevents you from: °¨ Being able to talk. °¨ Performing your usual physical activities. °· You have chest pain lasting longer than 5 minutes. °· Your skin color is more cyanotic than usual. °· You measure low oxygen saturations for longer than 5 minutes with a pulse oximeter. °MAKE SURE YOU: °· Understand these instructions. °· Will watch your condition. °· Will get help right away if you are not doing well or get worse. °  °This information is not intended to replace advice given to you by your health care provider. Make sure you discuss any questions you have with your health care provider. °  °Document Released: 09/08/2005 Document Revised: 12/20/2014 Document Reviewed: 07/26/2013 °Elsevier Interactive Patient   Education ©2016 [employer] Inc. ° °

## 2016-01-04 NOTE — Progress Notes (Signed)
CM received call from RN, Greta requesting I check to ensure pt is set up with [facility].  Edgardo Roys called back stating family will bring transport tank to room for transport home.  CM called [facility] rep, Tiffany to notify of discharge for start of care and Tiffany states she will review O2 order for any changes from previous setup. No other CM needs were communicated.

## 2016-01-04 NOTE — Progress Notes (Addendum)
Patient was discharged home with home health and oxygen  by MD order; oxygen tank was bring from home by patient's daughter; discharged instructions  review and give to patient and his wife with care notes and prescriptions; IV DIC; skin intact; patient will be escorted to the car by nurse tech via wheelchair with oxygen.

## 2016-01-09 ENCOUNTER — Ambulatory Visit (INDEPENDENT_AMBULATORY_CARE_PROVIDER_SITE_OTHER)
Admission: RE | Admit: 2016-01-09 | Discharge: 2016-01-09 | Disposition: A | Discharge status: Home / Self Care | Payer: Medicare Other | Source: Ambulatory Visit | Attending: Acute Care | Admitting: Acute Care

## 2016-01-09 ENCOUNTER — Encounter: Payer: Self-pay | Admitting: Acute Care

## 2016-01-09 ENCOUNTER — Ambulatory Visit (INDEPENDENT_AMBULATORY_CARE_PROVIDER_SITE_OTHER): Payer: Medicare Other | Admitting: Acute Care

## 2016-01-09 VITALS — BP 108/70 | HR 72 | Ht 64.0 in | Wt 233.0 lb

## 2016-01-09 DIAGNOSIS — I5032 Chronic diastolic (congestive) heart failure: Secondary | ICD-10-CM

## 2016-01-09 DIAGNOSIS — J449 Chronic obstructive pulmonary disease, unspecified: Secondary | ICD-10-CM | POA: Diagnosis not present

## 2016-01-09 DIAGNOSIS — J45909 Unspecified asthma, uncomplicated: Secondary | ICD-10-CM

## 2016-01-09 DIAGNOSIS — F411 Generalized anxiety disorder: Secondary | ICD-10-CM

## 2016-01-09 DIAGNOSIS — J690 Pneumonitis due to inhalation of food and vomit: Secondary | ICD-10-CM

## 2016-01-09 MED ORDER — FIRST-DUKES MOUTHWASH MT SUSP: OROMUCOSAL | Status: DC

## 2016-01-09 MED ORDER — LORAZEPAM 0.5 MG PO TABS: 0.5000 mg | ORAL_TABLET | Freq: Three times a day (TID) | ORAL | Status: DC | PRN

## 2016-01-09 NOTE — Progress Notes (Signed)
Subjective:    Patient ID: [name], male    DOB: [birth year], 65 y.o.   MRN: [id number]  HPI 65 yo former smoker with GOLD 3 COPD  on home oxygen at 3 L continuously, anxiety, hypothyroidism, HTN, HLD, OA, BPH, and grade 1 diastolic CHF.  Significant events/tests: Hospital admission for bibasilar pneumonia, acute on chronic respiratory failure with hypoxia due to COPD. Date of Admission: 12/28/2015 Date of Discharge: 01/04/2016  12/31/2015: Grade 1 diastolic heart failure per echo . EF: 55-60% Abnormal Left ventricular relaxation  Treated with 7 days of empiric antibiotic therapy, diuresed with Lasix for a net negative balance of 10.5 L over hospitalization. Discharge weight 104.7 kg.  01/09/2016  EXAM: CHEST 2 VIEW  COMPARISON: 01/02/2016  FINDINGS: Cardiomediastinal silhouette is normal. Mediastinal contours appear intact.  There is improvement in left lower lobe airspace consolidation, with residual ground-glass predominantly peribronchovascular opacities. There are likely bilateral small pleural effusions. Right mid lung 5 mm granuloma is seen.  Osseous structures are without acute abnormality. Soft tissues are grossly normal.  IMPRESSION: Improvement in left lower lobe airspace consolidation, with residual predominantly peribronchovascular airspace opacities.  Probably bilateral small pleural effusions.   01/09/2016 Hospital Follow Up:  Patient presents to office for hospital follow-up: States he feels much better. Sputum is clear. Denies fever, chest pain, orthopnea, or hemoptysis. He is taking his Lasix as prescribed and wearing his compression hose daily. Discharge weight 104.7 kg. He is continuing to use home physical therapy and states he feels he is almost back to prehospitalization baseline. He continues to use his walker and his oxygen at 3 L continuously. He is here today with his daughter. He remains on Spiriva and Symbicort as his COPD  maintenance medicines. Prednisone taper will be finished on 01/12/2016. He is using his albuterol nebs on average 3 times a day. Echo this hospitalization showed a grade 1 diastolic heart failure with EF preserved at 55-60%.  Past Medical History  Diagnosis Date  . COPD (chronic obstructive pulmonary disease) (HCC)   . Chronic respiratory failure with hypoxia (HCC)   . Anxiety   . Dyspnea   . Hypothyroidism   . Hypertension   . Hyperlipidemia   . Osteoarthritis   . BPH (benign prostatic hyperplasia)   . Hernia, incisional      Current outpatient prescriptions:  .  albuterol (PROAIR HFA) 108 (90 Base) MCG/ACT inhaler, INHALE 2 PUFFS INTO LUNGS EVERY 4 HOURS AS NEEDED for wheezing when not at home/unable to use albuterol nebulizer - DO NOT USE BOTH THIS INHALER AND YOUR ALBUTEROL NEBULIZER at the same time - they both provide the same medicine, Disp: 25.5 Inhaler, Rfl: 3 .  albuterol (PROVENTIL) (2.5 MG/3ML) 0.083% nebulizer solution, USE 1 VIAL IN NEBULIZER EVERY 4 HOURS AS NEEDED FOR WHEEZING/SHORTNESS OF BREATH, Disp: 525 mL, Rfl: 2 .  atorvastatin (LIPITOR) 10 MG tablet, TAKE 0.5 TABLETS (5 MG TOTAL) BY MOUTH DAILY., Disp: 90 tablet, Rfl: 2 .  clindamycin (CLEOCIN) 150 MG capsule, 4 tabs po x 1 prior to dentist visits, Disp: , Rfl:  .  clotrimazole-betamethasone (LOTRISONE) cream, APPLY TWICE A DAY FOR 10 TO 14 DAYS AS DIRECTED, Disp: 45 g, Rfl: 1 .  DULoxetine (CYMBALTA) 60 MG capsule, Take 1 capsule by mouth daily., Disp: , Rfl: 0 .  fluticasone (FLONASE) 50 MCG/ACT nasal spray, Place 2 sprays into both nostrils daily., Disp: 16 g, Rfl: 0 .  furosemide (LASIX) 40 MG tablet, TAKE TWO TABLETS BY MOUTH TWICE DAILY,  Disp: 360 tablet, Rfl: 3 .  Guaifenesin (MUCINEX MAXIMUM STRENGTH) 1200 MG TB12, Take 1 tablet (1,200 mg total) by mouth 2 (two) times daily as needed (as needed for congestion)., Disp: 14 each, Rfl: 0 .  HYDROcodone-acetaminophen (NORCO) 10-325 MG per tablet, Take 1 tablet by  mouth every 6 (six) hours as needed. , Disp: , Rfl:  .  KLOR-CON M20 20 MEQ tablet, TAKE ONE TABLET BY MOUTH DAILY, Disp: 90 tablet, Rfl: 3 .  levothyroxine (SYNTHROID, LEVOTHROID) 200 MCG tablet, TAKE 1 TABLET (200 MCG TOTAL) BY MOUTH DAILY., Disp: 90 tablet, Rfl: 3 .  loratadine (CLARITIN) 10 MG tablet, Take 10 mg by mouth daily.  , Disp: , Rfl:  .  LORazepam (ATIVAN) 0.5 MG tablet, Take 1 tablet (0.5 mg total) by mouth every 8 (eight) hours as needed for anxiety., Disp: 30 tablet, Rfl: 1 .  Misc. Devices (ACAPELLA) MISC, Dx 496, Disp: 1 each, Rfl: 0 .  montelukast (SINGULAIR) 10 MG tablet, Take 1 tablet (10 mg total) by mouth at bedtime., Disp: 90 tablet, Rfl: 3 .  predniSONE (DELTASONE) 10 MG tablet, Take 4 tablets a day on 1/23 and 1/24, then take 3 tablets a day on 1/25 and 1/26, then take 2 tablets a day on 1/27 and 1/28, then take 1 tablet a day on 1/29 and 1/30, then stop, Disp: 20 tablet, Rfl: 0 .  ranitidine (ZANTAC) 150 MG tablet, Take 150 mg by mouth at bedtime.  , Disp: , Rfl:  .  Spacer/Aero-Holding Chambers (AEROCHAMBER Z-STAT PLUS) inhaler, Use as instructed, Disp: 1 each, Rfl: 0 .  sucralfate (CARAFATE) 1 G tablet, TAKE 1 TABLET FOUR TIMES A DAY, Disp: 360 tablet, Rfl: 2 .  SYMBICORT 160-4.5 MCG/ACT inhaler, INHALE 2 PUFFS INTO LUNGS TWICE DAILY, Disp: 30.6 Inhaler, Rfl: 3 .  tamsulosin (FLOMAX) 0.4 MG CAPS capsule, TAKE 1 CAPSULE (0.4 MG TOTAL) BY MOUTH DAILY., Disp: 90 capsule, Rfl: 3 .  tiotropium (SPIRIVA HANDIHALER) 18 MCG inhalation capsule, PLACE 1 CAPSULE (18 MCG TOTAL) INTO INHALER AND INHALE DAILY., Disp: 90 capsule, Rfl: 1 .  tiZANidine (ZANAFLEX) 4 MG capsule, Take 4 mg by mouth 2 (two) times daily., Disp: , Rfl:  .  valsartan (DIOVAN) 160 MG tablet, TAKE 1 TABLET (160 MG TOTAL) BY MOUTH DAILY., Disp: 90 tablet, Rfl: 3 .  Diphenhyd-Hydrocort-Nystatin (FIRST-DUKES MOUTHWASH) SUSP, Swish and swallow 5cc TID, Disp: 105 mL, Rfl: 0   Allergies  Allergen Reactions  .  Penicillins    Review of Systems    Constitutional:   No  weight loss, night sweats,  Fevers, chills, fatigue, or  lassitude.  HEENT:   No headaches,  Difficulty swallowing,  Tooth/dental problems, or  Sore throat,                No sneezing, itching, ear ache, nasal congestion, post nasal drip,   CV:  No chest pain,  Orthopnea, PND, +swelling in lower extremities L>R, no anasarca, dizziness, palpitations, syncope.   GI  No heartburn, indigestion, abdominal pain, nausea, vomiting, diarrhea, change in bowel habits, loss of appetite, bloody stools.   Resp: +  shortness of breath with exertion not at rest.  No excess mucus, no productive cough,  No non-productive cough,  No coughing up of blood.  No change in color of mucus.  No wheezing.  No chest wall deformity  Skin: no rash or lesions.  GU: no dysuria, change in color of urine, no urgency or frequency.  No flank pain, no  hematuria   MS:  No joint pain or swelling.  No decreased range of motion.  No back pain.  Psych:  No change in mood or affect. No depression or anxiety.  No memory loss.     Objective:   Physical Exam  BP 108/70 mmHg  Pulse 72  Ht  (1.626 m)  Wt 233 lb (105.688 kg)  BMI 39.97 kg/m2  SpO2 96%  Physical Exam:  General- No distress,  A&Ox3, NAD, obese male on oxygen. ENT: No sinus tenderness, TM clear, pale nasal mucosa, no oral exudate,no post nasal drip, no LAN Cardiac: S1, S2, regular rate and rhythm, no murmur Chest: No wheeze/ rales/ dullness;BS decreased bilaterally in the bases, no accessory muscle use, no nasal flaring, no sternal retractions Abd.: Soft Non-tender Ext: Traceperipheral edema Neuro:  normal strength, no focal deficits. Skin: No rashes, warm and dry Psych: normal mood and behavior       Assessment & Plan:

## 2016-01-09 NOTE — Patient Instructions (Addendum)
You look great today. Continue taking your Spiriva and Symbicort every day. This is your maintenance medication. Take every day without fail. Rinse your mouth with water after each use of the above. Continue using your nebs every 6 hours as needed. This is your rescue medication. Continue taking your lasix. Weigh yourself and call if you notice an increase in your weight and ankle swelling. We will renew your ativan prescription. Please do not drive if sleepy. Magic Mouthwash, swish and swallow three times a day 5 ml. Call for fever or change in sputum, increased congestion or increased swelling. Follow up in 1 month. Schedule first available with Dr. Craige Cotta. Please contact office for sooner follow up if symptoms do not improve or worsen or seek emergency care

## 2016-01-09 NOTE — Assessment & Plan Note (Signed)
Recent hospitalization

## 2016-01-09 NOTE — Assessment & Plan Note (Signed)
Remains anxious, but states better. Plan: Renew ativan: 30 tabs no refill. Counseled on appropriate use. Counseled not to drive when sleepy.

## 2016-01-09 NOTE — Assessment & Plan Note (Addendum)
Continued clearing per CXR. Small bilateral pleural effusions No clinical signs of ongoing infection. 3L baseline oxygen today.  Plan: Continue with COPD maintenance regimen Call if any increase in sputum production, change in sputum color, worsening SOB from baseline. Low threshold for treating suspected flare. Follow up in 1 month to ensure return to baseline, with CXR follow up of small bilateral pleural effusions. Appointment with Craige Cotta first available. Schedule today to get on books. Please contact office for sooner follow up if symptoms do not improve or worsen or seek emergency care

## 2016-01-09 NOTE — Assessment & Plan Note (Signed)
Returning to baseline after acute on chronic respiratory failure with multi-factorial etiology COPD and Diastolic Heart Failure. No cough, sputum is clear, denies fever, chills.  Plan:  Continue taking your Spiriva and Symbicort every day. This is your maintenance medication. Take every day without fail. Rinse your mouth with water after each use of the above. Magic Mouthwash for oral thrush. Renewed ativan 0.5 mg for anxiety. Counseled regarding use and not driving if sleepy. Follow up in 1 month. Schedule first available with Dr. Craige Cotta. Please contact office for sooner follow up if symptoms do not improve or worsen or seek emergency care    Continue using your nebs every 6 hours as needed. This is your rescue medication.

## 2016-01-11 NOTE — Progress Notes (Signed)
Reviewed and agree with assessment/plan. 

## 2016-01-12 ENCOUNTER — Inpatient Hospital Stay: Payer: Medicare Other | Admitting: Adult Health

## 2016-01-13 ENCOUNTER — Telehealth: Payer: Self-pay | Admitting: Pulmonary Disease

## 2016-01-13 NOTE — Telephone Encounter (Signed)
Called spoke with pt. He reports when he saw sarah on 01/09/16 she had mention CHF but then stated never mind she will figure out what is going on. Pt reports he was never told he had CHF and wants to know if he has this diagnosis and what does this mean. He wants me to send the message over to Dr. Craige Cotta for his review. Please advise Dr. Craige Cotta thanks

## 2016-01-14 NOTE — Telephone Encounter (Signed)
Spoke with pt.  Explained that his Echo showed grade 1 diastolic dysfunction.  This is likely related to hx of HTN.  No additional interventions needed for this beyond current therapies for blood pressure control.

## 2016-01-15 ENCOUNTER — Other Ambulatory Visit: Payer: Self-pay | Admitting: Pulmonary Disease

## 2016-01-15 ENCOUNTER — Ambulatory Visit (INDEPENDENT_AMBULATORY_CARE_PROVIDER_SITE_OTHER): Payer: Medicare Other | Admitting: Family Medicine

## 2016-01-15 ENCOUNTER — Telehealth: Payer: Self-pay | Admitting: Adult Health

## 2016-01-15 ENCOUNTER — Encounter: Payer: Self-pay | Admitting: Family Medicine

## 2016-01-15 VITALS — BP 110/58 | HR 98 | Temp 98.6°F | Resp 18 | Wt 234.0 lb

## 2016-01-15 DIAGNOSIS — I5189 Other ill-defined heart diseases: Secondary | ICD-10-CM

## 2016-01-15 DIAGNOSIS — I519 Heart disease, unspecified: Secondary | ICD-10-CM

## 2016-01-15 DIAGNOSIS — Z09 Encounter for follow-up examination after completed treatment for conditions other than malignant neoplasm: Secondary | ICD-10-CM

## 2016-01-15 DIAGNOSIS — J189 Pneumonia, unspecified organism: Secondary | ICD-10-CM

## 2016-01-15 MED ORDER — LEVOFLOXACIN 500 MG PO TABS: 500.0000 mg | ORAL_TABLET | Freq: Every day | ORAL | Status: DC
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
## 2016-01-15 NOTE — Telephone Encounter (Signed)
Spoke with pt. Saw his PCP today. They heard congestion in the RLL and was prescribed another antibiotic. He just wanted to make [place] aware of this. Nothing further was needed.

## 2016-01-22 ENCOUNTER — Ambulatory Visit (INDEPENDENT_AMBULATORY_CARE_PROVIDER_SITE_OTHER): Payer: Medicare Other | Admitting: Family Medicine

## 2016-01-22 ENCOUNTER — Encounter: Payer: Self-pay | Admitting: Family Medicine

## 2016-01-22 VITALS — BP 108/60 | HR 106 | Temp 98.3°F | Resp 24 | Ht 63.0 in | Wt 240.0 lb

## 2016-01-22 DIAGNOSIS — I519 Heart disease, unspecified: Secondary | ICD-10-CM

## 2016-01-22 DIAGNOSIS — J189 Pneumonia, unspecified organism: Secondary | ICD-10-CM | POA: Diagnosis not present

## 2016-01-22 DIAGNOSIS — I5189 Other ill-defined heart diseases: Secondary | ICD-10-CM
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
## 2016-01-29 ENCOUNTER — Ambulatory Visit (INDEPENDENT_AMBULATORY_CARE_PROVIDER_SITE_OTHER): Payer: Medicare Other | Admitting: Family Medicine

## 2016-01-29 ENCOUNTER — Encounter: Payer: Self-pay | Admitting: Family Medicine

## 2016-01-29 VITALS — BP 110/58 | HR 100 | Temp 98.3°F | Resp 24 | Ht 63.0 in | Wt 241.0 lb

## 2016-01-29 DIAGNOSIS — I5189 Other ill-defined heart diseases: Secondary | ICD-10-CM

## 2016-01-29 DIAGNOSIS — I519 Heart disease, unspecified: Secondary | ICD-10-CM
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
## 2016-01-30 ENCOUNTER — Other Ambulatory Visit: Payer: Medicare Other

## 2016-01-30 ENCOUNTER — Other Ambulatory Visit: Payer: Self-pay | Admitting: Family Medicine

## 2016-01-30 DIAGNOSIS — Z0000 Encounter for general adult medical examination without abnormal findings: Secondary | ICD-10-CM

## 2016-01-30 DIAGNOSIS — E785 Hyperlipidemia, unspecified: Secondary | ICD-10-CM

## 2016-01-30 DIAGNOSIS — E039 Hypothyroidism, unspecified: Secondary | ICD-10-CM

## 2016-01-30 DIAGNOSIS — I10 Essential (primary) hypertension: Secondary | ICD-10-CM

## 2016-01-30 DIAGNOSIS — Z79899 Other long term (current) drug therapy: Secondary | ICD-10-CM

## 2016-01-30 DIAGNOSIS — N400 Enlarged prostate without lower urinary tract symptoms: Secondary | ICD-10-CM

## 2016-01-31 LAB — PSA, MEDICARE: PSA: 0.57 ng/mL (ref <=4.00)

## 2016-01-31 LAB — COMPLETE METABOLIC PANEL WITH GFR
ALBUMIN: 3.8 g/dL (ref 3.6–5.1)
ALK PHOS: 93 U/L (ref 40–115)
ALT: 19 U/L (ref 9–46)
AST: 13 U/L (ref 10–35)
BILIRUBIN TOTAL: 0.5 mg/dL (ref 0.2–1.2)
BUN: 9 mg/dL (ref 7–25)
CALCIUM: 9.5 mg/dL (ref 8.6–10.3)
CO2: 33 mmol/L — ABNORMAL HIGH (ref 20–31)
CREATININE: 0.63 mg/dL — AB (ref 0.70–1.25)
Chloride: 99 mmol/L (ref 98–110)
GFR, Est African American: >89 mL/min (ref >=60)
GFR, Est Non African American: >89 mL/min (ref >=60)
GLUCOSE: 117 mg/dL — AB (ref 70–99)
POTASSIUM: 3.5 mmol/L (ref 3.5–5.3)
Sodium: 143 mmol/L (ref 135–146)
TOTAL PROTEIN: 6.3 g/dL (ref 6.1–8.1)

## 2016-01-31 LAB — CBC WITH DIFFERENTIAL/PLATELET
BASOS PCT: 0 % (ref 0–1)
Basophils Absolute: 0 10*3/uL (ref 0.0–0.1)
Eosinophils Absolute: 0.1 10*3/uL (ref 0.0–0.7)
Eosinophils Relative: 1 % (ref 0–5)
HEMATOCRIT: 39.6 % (ref 39.0–52.0)
HEMOGLOBIN: 13 g/dL (ref 13.0–17.0)
LYMPHS ABS: 1.6 10*3/uL (ref 0.7–4.0)
LYMPHS PCT: 23 % (ref 12–46)
MCH: 30.1 pg (ref 26.0–34.0)
MCHC: 32.8 g/dL (ref 30.0–36.0)
MCV: 91.7 fL (ref 78.0–100.0)
MONOS PCT: 8 % (ref 3–12)
MPV: 10.3 fL (ref 8.6–12.4)
Monocytes Absolute: 0.6 10*3/uL (ref 0.1–1.0)
NEUTROS ABS: 4.8 10*3/uL (ref 1.7–7.7)
NEUTROS PCT: 68 % (ref 43–77)
Platelets: 246 10*3/uL (ref 150–400)
RBC: 4.32 MIL/uL (ref 4.22–5.81)
RDW: 14.7 % (ref 11.5–15.5)
WBC: 7.1 10*3/uL (ref 4.0–10.5)

## 2016-01-31 LAB — LIPID PANEL
Cholesterol: 125 mg/dL (ref 125–200)
HDL: 33 mg/dL — AB (ref >=40)
LDL CALC: 54 mg/dL (ref <130)
Total CHOL/HDL Ratio: 3.8 Ratio (ref <=5.0)
Triglycerides: 192 mg/dL — ABNORMAL HIGH (ref <150)
VLDL: 38 mg/dL — ABNORMAL HIGH (ref <30)

## 2016-01-31 LAB — TSH: TSH: 0.61 m[IU]/L (ref 0.40–4.50)

## 2016-02-03 ENCOUNTER — Encounter: Payer: Medicare Other | Admitting: Family Medicine

## 2016-02-09 ENCOUNTER — Encounter: Payer: Self-pay | Admitting: Adult Health

## 2016-02-09 ENCOUNTER — Ambulatory Visit (INDEPENDENT_AMBULATORY_CARE_PROVIDER_SITE_OTHER)
Admission: RE | Admit: 2016-02-09 | Discharge: 2016-02-09 | Disposition: A | Discharge status: Home / Self Care | Payer: Medicare Other | Source: Ambulatory Visit | Attending: Adult Health | Admitting: Adult Health

## 2016-02-09 ENCOUNTER — Ambulatory Visit (INDEPENDENT_AMBULATORY_CARE_PROVIDER_SITE_OTHER): Payer: Medicare Other | Admitting: Adult Health

## 2016-02-09 VITALS — BP 112/60 | HR 92 | Temp 98.7°F | Ht 64.0 in | Wt 237.0 lb

## 2016-02-09 DIAGNOSIS — J690 Pneumonitis due to inhalation of food and vomit: Secondary | ICD-10-CM

## 2016-02-09 DIAGNOSIS — J449 Chronic obstructive pulmonary disease, unspecified: Secondary | ICD-10-CM

## 2016-02-09 DIAGNOSIS — I5032 Chronic diastolic (congestive) heart failure: Secondary | ICD-10-CM

## 2016-02-09 DIAGNOSIS — J9611 Chronic respiratory failure with hypoxia: Secondary | ICD-10-CM | POA: Diagnosis not present

## 2016-02-09 DIAGNOSIS — J45909 Unspecified asthma, uncomplicated: Secondary | ICD-10-CM

## 2016-02-09 NOTE — Assessment & Plan Note (Signed)
Controlled on O2  

## 2016-02-09 NOTE — Progress Notes (Signed)
Reviewed and agree with assessment/plan. 

## 2016-02-09 NOTE — Assessment & Plan Note (Signed)
Recent admission , now resolved   Plan  Continue on Symbicort and Spiriva .  Low salt diet  Continue on Oxygen 3l/m .  Follow up Dr. Craige Cotta  In 3 months and As needed

## 2016-02-09 NOTE — Progress Notes (Signed)
Subjective:    Patient ID: [name], male    DOB: [birth year], 65 y.o.   MRN: [id number]  HPI 65 yo former smoker with GOLD 3 COPD    02/09/2016 Follow up : COPD  Exacerbation/PNA  Pt returns for follow up for COPD exacerbation  Recent admission for COPD flare and PNA .  He was treated with abx. And diuresis  He is feeling better. Decreased cough and congestion .  CXR today shows stable basilar density c/w probable scarring.  Remains on Symbicort and Spiriva .  On O2 at 3l/m . Helios marathon works well.  Vaccines utd.  Denies chest pain, orthopnea, edema or hemoptysis.        Immunization History  Administered Date(s) Administered  . Influenza Split 10/25/2011  . Influenza Whole 09/10/2009, 09/04/2010, 09/12/2012  . Influenza,inj,Quad PF,36+ Mos 10/17/2013, 09/25/2014, 10/22/2015  . Pneumococcal Conjugate-13 01/28/2015  . Pneumococcal Polysaccharide-23 12/13/2006    Tests: PFT 12/24/09>>FEV1 1.60(57%), FEV1% 52, TLC 6.86(130%), DLCO 67%, no BD PSG with 3 liters oxygen 06/12/12>>AHI 1.2, SpO2 low 90%, PLMI 1. Echo 09/27/12>>EF 55 to 60%, mild RA dilation Echo 12/2015 EF 55-60%, Gr 1 DD   Past Medical History  Diagnosis Date  . COPD (chronic obstructive pulmonary disease) (HCC)   . Chronic respiratory failure with hypoxia (HCC)   . Anxiety   . Dyspnea   . Hypothyroidism   . Hypertension   . Hyperlipidemia   . Osteoarthritis   . BPH (benign prostatic hyperplasia)   . Hernia, incisional    Current Outpatient Prescriptions on File Prior to Visit  Medication Sig Dispense Refill  . albuterol (PROAIR HFA) 108 (90 Base) MCG/ACT inhaler INHALE 2 PUFFS INTO LUNGS EVERY 4 HOURS AS NEEDED for wheezing when not at home/unable to use albuterol nebulizer - DO NOT USE BOTH THIS INHALER AND YOUR ALBUTEROL NEBULIZER at the same time - they both provide the same medicine 25.5 Inhaler 3  . albuterol (PROVENTIL) (2.5 MG/3ML) 0.083% nebulizer solution USE 1 VIAL IN NEBULIZER EVERY  4 HOURS AS NEEDED FOR WHEEZING/SHORTNESS OF BREATH 525 mL 2  . atorvastatin (LIPITOR) 10 MG tablet TAKE 0.5 TABLETS (5 MG TOTAL) BY MOUTH DAILY. 90 tablet 2  . clindamycin (CLEOCIN) 150 MG capsule 4 tabs po x 1 prior to dentist visits    . clotrimazole-betamethasone (LOTRISONE) cream APPLY TWICE A DAY FOR 10 TO 14 DAYS AS DIRECTED 45 g 1  . Diphenhyd-Hydrocort-Nystatin (FIRST-DUKES MOUTHWASH) SUSP Swish and swallow 5cc TID 105 mL 0  . DULoxetine (CYMBALTA) 60 MG capsule Take 1 capsule by mouth daily.  0  . fluticasone (FLONASE) 50 MCG/ACT nasal spray Place 2 sprays into both nostrils daily. 16 g 0  . furosemide (LASIX) 40 MG tablet TAKE TWO TABLETS BY MOUTH TWICE DAILY (Patient taking differently: TAKE THREE TABLETS BY MOUTH IN THE AM AND 2 TABLETS AT LUNCH) 360 tablet 3  . Guaifenesin (MUCINEX MAXIMUM STRENGTH) 1200 MG TB12 Take 1 tablet (1,200 mg total) by mouth 2 (two) times daily as needed (as needed for congestion). 14 each 0  . HYDROcodone-acetaminophen (NORCO) 10-325 MG per tablet Take 1 tablet by mouth every 6 (six) hours as needed.     Marland Kitchen KLOR-CON M20 20 MEQ tablet TAKE ONE TABLET BY MOUTH DAILY 90 tablet 3  . levothyroxine (SYNTHROID, LEVOTHROID) 200 MCG tablet TAKE 1 TABLET (200 MCG TOTAL) BY MOUTH DAILY. 90 tablet 3  . loratadine (CLARITIN) 10 MG tablet Take 10 mg by mouth daily.      Marland Kitchen  LORazepam (ATIVAN) 0.5 MG tablet Take 1 tablet (0.5 mg total) by mouth every 8 (eight) hours as needed for anxiety. 30 tablet 1  . Misc. Devices (ACAPELLA) MISC Dx 496 1 each 0  . montelukast (SINGULAIR) 10 MG tablet Take 1 tablet (10 mg total) by mouth at bedtime. 90 tablet 3  . ranitidine (ZANTAC) 150 MG tablet Take 150 mg by mouth at bedtime.      Marland Kitchen Spacer/Aero-Holding Chambers (AEROCHAMBER Z-STAT PLUS) inhaler Use as instructed 1 each 0  . sucralfate (CARAFATE) 1 G tablet TAKE 1 TABLET FOUR TIMES A DAY 360 tablet 2  . SYMBICORT 160-4.5 MCG/ACT inhaler INHALE 2 PUFFS INTO LUNGS TWICE DAILY 30.6  Inhaler 3  . tamsulosin (FLOMAX) 0.4 MG CAPS capsule TAKE 1 CAPSULE (0.4 MG TOTAL) BY MOUTH DAILY. 90 capsule 3  . tiotropium (SPIRIVA HANDIHALER) 18 MCG inhalation capsule PLACE 1 CAPSULE (18 MCG TOTAL) INTO INHALER AND INHALE DAILY. 90 capsule 1  . tiZANidine (ZANAFLEX) 4 MG capsule Take 4 mg by mouth 2 (two) times daily.    . valsartan (DIOVAN) 160 MG tablet TAKE 1 TABLET (160 MG TOTAL) BY MOUTH DAILY. 90 tablet 3   No current facility-administered medications on file prior to visit.    Review of Systems Constitutional:   No  weight loss, night sweats,  Fevers, chills,  +fatigue, or  lassitude.  HEENT:   No headaches,  Difficulty swallowing,  Tooth/dental problems, or  Sore throat,       No sneezing, itching, ear ache, nasal congestion, post nasal drip,   CV:  No chest pain,  Orthopnea, PND, swelling in lower extremities, anasarca, dizziness, palpitations, syncope.   GI  No heartburn, indigestion, abdominal pain, nausea, vomiting, diarrhea, change in bowel habits, loss of appetite, bloody stools.   Resp:   No chest wall deformity  Skin: no rash or lesions.  GU: no dysuria, change in color of urine, no urgency or frequency.  No flank pain, no hematuria   MS:  No joint pain or swelling.  No decreased range of motion.  No back pain.  Psych:  No change in mood or affect. No depression or anxiety.  No memory loss.         Objective:   Physical Exam   Filed Vitals:   02/09/16 1547  BP: 112/60  Pulse: 114  Temp: 98.7 F (37.1 C)  TempSrc: Oral  Height:  (1.626 m)  Weight: 237 lb (107.502 kg)  SpO2: 92%   Body mass index is 40.66 kg/(m^2).   GEN: A/Ox3; pleasant , NAD, obese    HEENT:  [place]/AT,  EACs-clear, TMs-wnl, NOSE-clear drainage  THROAT-clear, no lesions, no postnasal drip or exudate noted. Poor dentition  NECK:  Supple w/ fair ROM; no JVD; normal carotid impulses w/o bruits; no thyromegaly or nodules palpated; no lymphadenopathy.  RESP  Decreased BS in  bases , .no accessory muscle use, no dullness to percussion  CARD:  RRR, no m/r/g  , tr  peripheral edema, pulses intact, no cyanosis or clubbing.  GI:   Soft & nt; nml bowel sounds; no organomegaly or masses detected.  Musco: Warm bil, no deformities or joint swelling noted.   Neuro: alert, no focal deficits noted.    Skin: Warm, no lesions or rashes   CXR 02/09/2016 reviewed independently and agree with radiology interpretation Stable left basilar density is noted most consistent with scarring.   Cassaundra Rasch NP-C  [facility]  02/09/2016  Assessment & Plan:

## 2016-02-09 NOTE — Patient Instructions (Addendum)
Continue on Symbicort and Spiriva .  Low salt diet  Continue on Oxygen 3l/m .  Follow up Dr. Craige Cotta  In 3 months and As needed

## 2016-02-09 NOTE — Assessment & Plan Note (Signed)
Controlled on diuretic

## 2016-02-26 ENCOUNTER — Other Ambulatory Visit: Payer: Self-pay | Admitting: Family Medicine

## 2016-02-27 ENCOUNTER — Other Ambulatory Visit: Payer: Self-pay | Admitting: Family Medicine

## 2016-02-27 NOTE — Telephone Encounter (Signed)
Medication refilled per protocol. 

## 2016-03-01 ENCOUNTER — Ambulatory Visit (INDEPENDENT_AMBULATORY_CARE_PROVIDER_SITE_OTHER): Payer: Medicare Other | Admitting: Family Medicine

## 2016-03-01 ENCOUNTER — Encounter: Payer: Self-pay | Admitting: Family Medicine

## 2016-03-01 VITALS — BP 122/64 | HR 100 | Temp 98.3°F | Resp 20 | Ht 63.0 in | Wt 237.0 lb

## 2016-03-01 DIAGNOSIS — M461 Sacroiliitis, not elsewhere classified: Secondary | ICD-10-CM

## 2016-03-01 MED ORDER — KETOROLAC TROMETHAMINE 10 MG PO TABS: 10.0000 mg | ORAL_TABLET | Freq: Four times a day (QID) | ORAL | Status: DC | PRN

## 2016-03-02 ENCOUNTER — Encounter: Payer: Self-pay | Admitting: Family Medicine
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
## 2016-03-11 ENCOUNTER — Ambulatory Visit (INDEPENDENT_AMBULATORY_CARE_PROVIDER_SITE_OTHER)
Admission: RE | Admit: 2016-03-11 | Discharge: 2016-03-11 | Disposition: A | Discharge status: Home / Self Care | Payer: Medicare Other | Source: Ambulatory Visit | Attending: Internal Medicine | Admitting: Internal Medicine

## 2016-03-11 ENCOUNTER — Ambulatory Visit (INDEPENDENT_AMBULATORY_CARE_PROVIDER_SITE_OTHER): Payer: Medicare Other | Admitting: Internal Medicine

## 2016-03-11 ENCOUNTER — Other Ambulatory Visit (INDEPENDENT_AMBULATORY_CARE_PROVIDER_SITE_OTHER): Payer: Medicare Other

## 2016-03-11 ENCOUNTER — Encounter: Payer: Self-pay | Admitting: Internal Medicine

## 2016-03-11 VITALS — BP 116/60 | HR 83 | Ht 64.0 in | Wt 234.4 lb

## 2016-03-11 DIAGNOSIS — R0600 Dyspnea, unspecified: Secondary | ICD-10-CM

## 2016-03-11 DIAGNOSIS — J9611 Chronic respiratory failure with hypoxia: Secondary | ICD-10-CM | POA: Diagnosis not present

## 2016-03-11 DIAGNOSIS — J9612 Chronic respiratory failure with hypercapnia: Secondary | ICD-10-CM

## 2016-03-11 DIAGNOSIS — J441 Chronic obstructive pulmonary disease with (acute) exacerbation: Secondary | ICD-10-CM | POA: Diagnosis not present

## 2016-03-11 LAB — CBC WITH DIFFERENTIAL/PLATELET
BASOS PCT: 0.3 % (ref 0.0–3.0)
Basophils Absolute: 0 10*3/uL (ref 0.0–0.1)
Eosinophils Absolute: 0.2 10*3/uL (ref 0.0–0.7)
Eosinophils Relative: 3.2 % (ref 0.0–5.0)
HEMATOCRIT: 39.4 % (ref 39.0–52.0)
Hemoglobin: 13.1 g/dL (ref 13.0–17.0)
LYMPHS PCT: 18.4 % (ref 12.0–46.0)
Lymphs Abs: 1.1 10*3/uL (ref 0.7–4.0)
MCHC: 33.3 g/dL (ref 30.0–36.0)
MCV: 90.5 fl (ref 78.0–100.0)
MONOS PCT: 7.8 % (ref 3.0–12.0)
Monocytes Absolute: 0.5 10*3/uL (ref 0.1–1.0)
NEUTROS ABS: 4.2 10*3/uL (ref 1.4–7.7)
Neutrophils Relative %: 70.3 % (ref 43.0–77.0)
PLATELETS: 244 10*3/uL (ref 150.0–400.0)
RBC: 4.35 Mil/uL (ref 4.22–5.81)
RDW: 15.2 % (ref 11.5–15.5)
WBC: 5.9 10*3/uL (ref 4.0–10.5)

## 2016-03-11 LAB — BASIC METABOLIC PANEL
BUN: 7 mg/dL (ref 6–23)
CALCIUM: 10 mg/dL (ref 8.4–10.5)
CO2: 33 meq/L — AB (ref 19–32)
Chloride: 101 mEq/L (ref 96–112)
Creatinine, Ser: 0.57 mg/dL (ref 0.40–1.50)
GFR: 153.18 mL/min (ref >60.00)
Glucose, Bld: 126 mg/dL — ABNORMAL HIGH (ref 70–99)
Potassium: 4.5 mEq/L (ref 3.5–5.1)
SODIUM: 140 meq/L (ref 135–145)

## 2016-03-11 LAB — BRAIN NATRIURETIC PEPTIDE: Pro B Natriuretic peptide (BNP): 18 pg/mL (ref 0.0–100.0)

## 2016-03-11 MED ORDER — PREDNISONE 10 MG PO TABS: ORAL_TABLET | ORAL | Status: DC

## 2016-03-11 MED ORDER — TIOTROPIUM BROMIDE MONOHYDRATE 2.5 MCG/ACT IN AERS: INHALATION_SPRAY | RESPIRATORY_TRACT | Status: DC

## 2016-03-11 MED ORDER — PANTOPRAZOLE SODIUM 40 MG PO TBEC: 40.0000 mg | DELAYED_RELEASE_TABLET | Freq: Every day | ORAL | Status: DC

## 2016-03-11 NOTE — Progress Notes (Signed)
Subjective:     Patient ID: Samuel DakinMichael Dilmore, male   DOB: 1951/11/11,  MRN: [id number]  HPI  HPI 65 yo quit smoking  2012   with GOLD 3 COPD    02/09/2016 Follow up : COPD Exacerbation/PNA  Pt returns for follow up for COPD exacerbation  Recent admission for COPD flare and PNA .  He was treated with abx. And diuresis  He is feeling better. Decreased cough and congestion .  CXR today shows stable basilar density c/w probable scarring.  Remains on Symbicort and Spiriva .  On O2 at 3l/m . Helios marathon works well.  Vaccines utd.  Denies chest pain, orthopnea, edema or hemoptysis.        Immunization History  Administered Date(s) Administered  . Influenza Split 10/25/2011  . Influenza Whole 09/10/2009, 09/04/2010, 09/12/2012  . Influenza,inj,Quad PF,36+ Mos 10/17/2013, 09/25/2014, 10/22/2015  . Pneumococcal Conjugate-13 01/28/2015  . Pneumococcal Polysaccharide-23 12/13/2006    Tests: PFT 12/24/09>>FEV1 1.60(57%), FEV1% 52, TLC 6.86(130%), DLCO 67%, no BD PSG with 3 liters oxygen 06/12/12>>AHI 1.2, SpO2 low 90%, PLMI 1. Echo 09/27/12>>EF 55 to 60%, mild RA dilation Echo 12/2015 EF 55-60%, Gr 1 DD       rec Continue on Symbicort and Spiriva .  Low salt diet  Continue on Oxygen 3l/m    03/11/2016 acute extended ov/Dionis Autry re:  Chief Complaint  Patient presents with  . Acute Visit    increased SOB x3 days with decreased mucus production (clear/milky), tightness, occasional wheezing.  takes 3x as long to recover from exertion.  denies any f/c/s, hemoptysis   was "better"   on 3lpm 24/7 and neb 3 x neb despite symbicort/spiriva>>  then worse x 3 days assoc with dysphagia despite taking carafate qid  Onset was abrupt/ pattern is progressive sob with activity but ok at rest p saba neb.  No obvious day to day or daytime variability or assoc   cp or chest tightness,   or overt sinus or hb symptoms. No unusual exp hx or h/o childhood pna/ asthma or  knowledge of premature birth.  Sleeping ok without nocturnal  or early am exacerbation  of respiratory  c/o's or need for noct saba. Also denies any obvious fluctuation of symptoms with weather or environmental changes or other aggravating or alleviating factors except as outlined above   Current Medications, Allergies, Complete Past Medical History, Past Surgical History, Family History, and Social History were reviewed in [facility] electronic medical record.  ROS  The following are not active complaints unless bolded sore throat, dysphagia, dental problems, itching, sneezing,  nasal congestion or excess/ purulent secretions, ear ache,   fever, chills, sweats, unintended wt loss, classically pleuritic or exertional cp, hemoptysis,  orthopnea pnd or leg swelling, presyncope, palpitations, abdominal pain, anorexia, nausea, vomiting, diarrhea  or change in bowel or bladder habits, change in stools or urine, dysuria,hematuria,  rash, arthralgias, visual complaints, headache, numbness, weakness or ataxia or problems with walking or coordination,  change in mood/affect or memory.             Review of Systems     Objective:   Physical Exam    w/c bound obese wm nad  Wt Readings from Last 3 Encounters:  03/11/16 234 lb 6.4 oz (106.323 kg)  03/01/16 237 lb (107.502 kg)  02/09/16 237 lb (107.502 kg)    Vital signs reviewed   HEENT: nl dentition, turbinates, and oropharynx. Nl external ear canals without cough reflex   NECK :  without JVD/Nodes/TM/ nl carotid upstrokes bilaterally   LUNGS: no acc muscle use,  Nl contour chest with bilateral moderate insp and exp rhonchi and some pseudowheeze better with plm   CV:  RRR  no s3 or murmur or increase in P2, no edema   ABD:  soft and nontender with nl inspiratory excursion in the supine position. No bruits or organomegaly, bowel sounds nl  MS:  Nl gait/ ext warm without deformities, calf tenderness, cyanosis or clubbing No obvious  joint restrictions   SKIN: warm and dry without lesions    NEURO:  alert, approp, nl sensorium with  no motor deficits    CXR PA and Lateral:   03/11/2016 :    I personally reviewed images and agree with radiology impression as follows:    COPD, chronic pulmonary fibrotic change. Previous granulomatous infection. There is no evidence of pneumonia, CHF, nor other acute cardiopulmonary disease.  Labs ordered/ reviewed:      Chemistry      Component Value Date/Time   NA 140 03/11/2016 1045   K 4.5 03/11/2016 1045   CL 101 03/11/2016 1045   CO2 33* 03/11/2016 1045   BUN 7 03/11/2016 1045   CREATININE 0.57 03/11/2016 1045   CREATININE 0.63* 01/30/2016 1609      Component Value Date/Time   CALCIUM 10.0 03/11/2016 1045   ALKPHOS 93 01/30/2016 1609   AST 13 01/30/2016 1609   ALT 19 01/30/2016 1609   BILITOT 0.5 01/30/2016 1609        Lab Results  Component Value Date   WBC 5.9 03/11/2016   HGB 13.1 03/11/2016   HCT 39.4 03/11/2016   MCV 90.5 03/11/2016   PLT 244.0 03/11/2016       Lab Results  Component Value Date   TSH 0.61 01/30/2016     Lab Results  Component Value Date   PROBNP 18.0 03/11/2016          Assessment:

## 2016-03-11 NOTE — Progress Notes (Signed)
Quick Note:  Spoke with pt and notified of results per Dr. Wert. Pt verbalized understanding and denied any questions.  ______ 

## 2016-03-11 NOTE — Patient Instructions (Addendum)
Plan A = Automatic = Symbicort 160 Take 2 puffs first thing in am and then another 2 puffs about 12 hours later.                                      Spiriva 2 pffs and Pantoprazole 40 mg Take 30-60 min before first meal of the day plus continue  zantac at bedtime     Plan B = Backup Only use your albuterol (proair) as a rescue medication to be used if you can't catch your breath by resting or doing a relaxed purse lip breathing pattern.  - The less you use it, the better it will work when you need it. - Ok to use up to 2 puffs  every 4 hours if you must but call for appointment if use goes up over your usual need - Don't leave home without it !!  (think of it like the spare tire for your car)   Plan C = Crisis - only use your albuterol nebulizer if you first try Plan B and it fails to help > ok to use the nebulizer up to every 4 hours but if start needing it regularly call for immediate appointment  Prednisone 10 mg take  4 each am x 2 days,   2 each am x 2 days,  1 each am x 2 days and stop   GERD (REFLUX)  is an extremely common cause of respiratory symptoms just like yours , many times with no obvious heartburn at all.    It can be treated with medication, but also with lifestyle changes including elevation of the head of your bed (ideally with 6 inch  bed blocks),  Smoking cessation, avoidance of late meals, excessive alcohol, and avoid fatty foods, chocolate, peppermint, colas, red wine, and acidic juices such as orange juice.  NO MINT OR MENTHOL PRODUCTS SO NO COUGH DROPS  USE SUGARLESS CANDY INSTEAD (Jolley ranchers or Stover's or Life Savers) or even ice chips will also do - the key is to swallow to prevent all throat clearing. NO OIL BASED VITAMINS - use powdered substitutes.   Please remember to go to the lab and x-ray department downstairs for your tests - we will call you with the results when they are available.     Keep appt with Dr Craige CottaSood April 12

## 2016-03-12 ENCOUNTER — Telehealth: Payer: Self-pay | Admitting: Internal Medicine

## 2016-03-12 LAB — RESPIRATORY ALLERGY PROFILE REGION II ~~LOC~~
ALLERGEN, COMM SILVER BIRCH, T3: 0.13 kU/L — AB
ALLERGEN, COTTONWOOD, T14: 0.21 kU/L — AB
Allergen, Cedar tree, t12: 0.19 kU/L — ABNORMAL HIGH
Allergen, D pternoyssinus,d7: <0.1 kU/L
Allergen, Oak,t7: 0.13 kU/L — ABNORMAL HIGH
BERMUDA GRASS: 0.22 kU/L — AB
BOX ELDER: 0.35 kU/L — AB
COCKROACH: 0.13 kU/L — AB
COMMON RAGWEED: 0.11 kU/L — AB
Cat Dander: <0.1 kU/L
D. farinae: <0.1 kU/L
Dog Dander: <0.1 kU/L
Elm IgE: 0.7 kU/L — ABNORMAL HIGH
IGE (IMMUNOGLOBULIN E), SERUM: 359 kU/L — AB (ref <115)
Johnson Grass: 0.17 kU/L — ABNORMAL HIGH
Pecan/Hickory Tree IgE: 0.11 kU/L — ABNORMAL HIGH
ROUGH PIGWEED IGE: 0.32 kU/L — AB
SHEEP SORREL IGE: 0.14 kU/L — AB
Timothy Grass: 0.11 kU/L — ABNORMAL HIGH

## 2016-03-12 NOTE — Telephone Encounter (Signed)
No problem as long as Take 30-60 min before first meal of the day

## 2016-03-12 NOTE — Telephone Encounter (Signed)
Per 03/11/16 OV w/ MW: Patient Instructions       Plan A = Automatic = Symbicort 160 Take 2 puffs first thing in am and then another 2 puffs about 12 hours later.                                      Spiriva 2 pffs and Pantoprazole 40 mg Take 30-60 min before first meal of the day plus continue  zantac at bedtime   --  Called spoke with pt. He reports he did pick up with pantoprazole 40 mg from the pharmacy. He wants to make sure this is safe to take even with him already taking carafate 1G QID. Please advise MW thanks

## 2016-03-12 NOTE — Telephone Encounter (Signed)
Called made pt aware of recs. He verbalized understanding and needed nothing further

## 2016-03-15 DIAGNOSIS — J441 Chronic obstructive pulmonary disease with (acute) exacerbation: Secondary | ICD-10-CM | POA: Insufficient documentation

## 2016-03-15 DIAGNOSIS — J9611 Chronic respiratory failure with hypoxia: Secondary | ICD-10-CM | POA: Insufficient documentation

## 2016-03-15 DIAGNOSIS — J9612 Chronic respiratory failure with hypercapnia: Secondary | ICD-10-CM

## 2016-03-15 NOTE — Assessment & Plan Note (Signed)
HCO3  03/11/2016 = 33   Adequate control on present rx, reviewed > no change in rx needed  For now / f/u by Dr Craige CottaSood planned

## 2016-03-15 NOTE — Assessment & Plan Note (Signed)
Obesity also contributing (see separate a/p)

## 2016-03-15 NOTE — Assessment & Plan Note (Signed)
Body mass index is 40.21 .  Lab Results  Component Value Date   TSH 0.61 01/30/2016     Contributing to gerd tendency/ doe/reviewed the need and the process to achieve and maintain neg calorie balance > defer f/u primary care including intermittently monitoring thyroid status

## 2016-03-15 NOTE — Assessment & Plan Note (Addendum)
DDX of  difficult airways management almost all start with A and  include Adherence, Ace Inhibitors, Acid Reflux, Active Sinus Disease, Alpha 1 Antitripsin deficiency, Anxiety masquerading as Airways dz,  ABPA,  Allergy(esp in young), Aspiration (esp in elderly), Adverse effects of meds,  Active smokers, A bunch of PE's (a small clot burden can't cause this syndrome unless there is already severe underlying pulm or vascular dz with poor reserve) plus two Bs  = Bronchiectasis and Beta blocker use..and one C= CHF  Adherence is always the initial "prime suspect" and is a multilayered concern that requires a "trust but verify" approach in every patient - starting with knowing how to use medications, especially inhalers, correctly, keeping up with refills and understanding the fundamental difference between maintenance and prns vs those medications only taken for a very short course and then stopped and not refilled.  - note over using nebs at baseline >reviewed - - The proper method of use, as well as anticipated side effects, of a metered-dose inhaler are discussed and demonstrated to the patient. Improved effectiveness after extensive coaching during this visit to a level of approximately 75 % from a baseline of 50 %   ? Acid (or non-acid) GERD > always difficult to exclude as up to 75% of pts in some series report no assoc GI/ Heartburn symptoms and he has known gerd with worse dysphagia > rec max (24h)  acid suppression and diet restrictions/ reviewed and instructions given in writing.   ? Allergy/asthma component > continue singulair and add  Prednisone 10 mg take  4 each am x 2 days,   2 each am x 2 days,  1 each am x 2 days and stop   ? chf > excluded by cxr and bnp << 100  I had an extended discussion with the patient reviewing all relevant studies completed to date and  lasting 15 to 20 minutes of a 25 minute visit    Each maintenance medication was reviewed in detail including most importantly  the difference between maintenance and prns and under what circumstances the prns are to be triggered using an action plan format that is not reflected in the computer generated alphabetically organized AVS.    Please see instructions for details which were reviewed in writing and the patient given a copy highlighting the part that I personally wrote and discussed at today's ov.

## 2016-03-15 NOTE — Progress Notes (Signed)
Quick Note:  Spoke with pt and notified of results per Dr. Wert. Pt verbalized understanding and denied any questions.  ______ 

## 2016-03-16 ENCOUNTER — Telehealth: Payer: Self-pay | Admitting: Pulmonary Disease

## 2016-03-16 MED ORDER — FLUTICASONE PROPIONATE 50 MCG/ACT NA SUSP: 2.0000 | Freq: Every day | NASAL | Status: DC

## 2016-03-16 NOTE — Telephone Encounter (Signed)
Spoke with pt. He is needing a refill on Fluticasone. Rx has been sent in. Nothing further was needed.

## 2016-03-24 ENCOUNTER — Ambulatory Visit: Payer: Medicare Other | Admitting: Pulmonary Disease

## 2016-04-04 ENCOUNTER — Other Ambulatory Visit: Payer: Self-pay | Admitting: Family Medicine

## 2016-04-28 ENCOUNTER — Encounter: Payer: Self-pay | Admitting: Internal Medicine

## 2016-04-28 ENCOUNTER — Ambulatory Visit (INDEPENDENT_AMBULATORY_CARE_PROVIDER_SITE_OTHER): Payer: Medicare Other | Admitting: Internal Medicine

## 2016-04-28 ENCOUNTER — Ambulatory Visit (INDEPENDENT_AMBULATORY_CARE_PROVIDER_SITE_OTHER)
Admission: RE | Admit: 2016-04-28 | Discharge: 2016-04-28 | Disposition: A | Discharge status: Home / Self Care | Payer: Medicare Other | Source: Ambulatory Visit | Attending: Internal Medicine | Admitting: Internal Medicine

## 2016-04-28 VITALS — BP 108/58 | HR 100 | Ht 64.0 in | Wt 237.0 lb

## 2016-04-28 DIAGNOSIS — J441 Chronic obstructive pulmonary disease with (acute) exacerbation: Secondary | ICD-10-CM | POA: Diagnosis not present

## 2016-04-28 DIAGNOSIS — J9611 Chronic respiratory failure with hypoxia: Secondary | ICD-10-CM

## 2016-04-28 DIAGNOSIS — J9612 Chronic respiratory failure with hypercapnia: Secondary | ICD-10-CM | POA: Diagnosis not present

## 2016-04-28 MED ORDER — HYDROCODONE-ACETAMINOPHEN 10-325 MG PO TABS: 1.0000 | ORAL_TABLET | ORAL | Status: AC | PRN

## 2016-04-28 MED ORDER — AZITHROMYCIN 250 MG PO TABS: ORAL_TABLET | ORAL | Status: DC

## 2016-04-28 MED ORDER — PREDNISONE 10 MG PO TABS: ORAL_TABLET | ORAL | Status: DC

## 2016-04-28 NOTE — Patient Instructions (Addendum)
Plan A = Automatic = Symbicort 160 Take 2 puffs first thing in am and then another 2 puffs about 12 hours later.                                      Spiriva 2 pffs and Pantoprazole 40 mg Take 30-60 min before first meal of the day plus continue  zantac at bedtime     Plan B = Backup Only use your albuterol (proair) as a rescue medication to be used if you can't catch your breath by resting or doing a relaxed purse lip breathing pattern.  - The less you use it, the better it will work when you need it. - Ok to use up to 2 puffs  every 4 hours if you must but call for appointment if use goes up over your usual need - Don't leave home without it !!  (think of it like the spare tire for your car)   Plan C = Crisis - only use your albuterol nebulizer if you first try Plan B and it fails to help > ok to use the nebulizer up to every 4 hours but if start needing it regularly call for immediate appointment  For cough > mucinex dm 1200 mg every 12 hours and supplement with norco if still coughing and use the flutter as much as you can  zpak  Prednisone 10 mg take  4 each am x 2 days,   2 each am x 2 days,  1 each am x 2 days and stop   GERD (REFLUX)  is an extremely common cause of respiratory symptoms just like yours , many times with no obvious heartburn at all.    It can be treated with medication, but also with lifestyle changes including elevation of the head of your bed (ideally with 6 inch  bed blocks),  Smoking cessation, avoidance of late meals, excessive alcohol, and avoid fatty foods, chocolate, peppermint, colas, red wine, and acidic juices such as orange juice.  NO MINT OR MENTHOL PRODUCTS SO NO COUGH DROPS  USE SUGARLESS CANDY INSTEAD (Jolley ranchers or Stover's or Life Savers) or even ice chips will also do - the key is to swallow to prevent all throat clearing. NO OIL BASED VITAMINS - use powdered substitutes.     Keep appt with Dr Craige CottaSood

## 2016-04-28 NOTE — Progress Notes (Signed)
Subjective:     Patient ID: [name], male   DOB: 11/29/1951,  MRN: [id number]  HPI  HPI 65 yo quit smoking  2012   with GOLD 2 COPD    02/09/2016 Follow up : COPD Exacerbation/PNA  Pt returns for follow up for COPD exacerbation  Recent admission for COPD flare and PNA .  He was treated with abx. And diuresis  He is feeling better. Decreased cough and congestion .  CXR today shows stable basilar density c/w probable scarring.  Remains on Symbicort and Spiriva .  On O2 at 3l/m . [place] marathon works well.  Vaccines utd.  Denies chest pain, orthopnea, edema or hemoptysis.        Immunization History  Administered Date(s) Administered  . Influenza Split 10/25/2011  . Influenza Whole 09/10/2009, 09/04/2010, 09/12/2012  . Influenza,inj,Quad PF,36+ Mos 10/17/2013, 09/25/2014, 10/22/2015  . Pneumococcal Conjugate-13 01/28/2015  . Pneumococcal Polysaccharide-23 12/13/2006    Tests: PFT 12/24/09>>FEV1 1.60(57%), FEV1% 52, TLC 6.86(130%), DLCO 67%, no BD PSG with 3 liters oxygen 06/12/12>>AHI 1.2, SpO2 low 90%, PLMI 1. Echo 09/27/12>>EF 55 to 60%, mild RA dilation Echo 12/2015 EF 55-60%, Gr 1 DD       rec Continue on Symbicort and Spiriva .  Low salt diet  Continue on Oxygen 3l/m    03/11/2016 acute extended ov/[name] re:  Chief Complaint  Patient presents with  . Acute Visit    increased SOB x3 days with decreased mucus production (clear/milky), tightness, occasional wheezing.  takes 3x as long to recover from exertion.  denies any f/c/s, hemoptysis   was "better"   on 3lpm 24/7 and neb 3 x neb despite symbicort/spiriva>>  then worse x 3 days assoc with dysphagia despite taking carafate qid rec Plan A = Automatic = Symbicort 160 Take 2 puffs first thing in am and then another 2 puffs about 12 hours later.                                      Spiriva 2 pffs and Pantoprazole 40 mg Take 30-60 min before first meal of the day plus continue  zantac  at bedtime  Plan B = Backup Only use your albuterol (proair) as a rescue medication  Plan C = Crisis - only use your albuterol nebulizer if you first try Plan B and it fails to help > ok to use the nebulizer up to every 4 hours but if start needing it regularly call for immediate appointment Prednisone 10 mg take  4 each am x 2 days,   2 each am x 2 days,  1 each am x 2 days and stop  GERD  Diet     04/28/2016 acute ext ov/[name] re: GOLD II at baseline/aecopd maint rx symb 160 2bid/ spriiva respimat Chief Complaint  Patient presents with  . Acute Visit    Pt c/o increased SOB, cough with milky mucus, increased HR and decreased O2 with exertion. Pt denies CP/tightness, f/n/v. Pt has been using flutter valve daily.   back to baseline and not needing much saba at all until sob/cough worse one week prior to OV  - has futter not using   Onset was abrupt/ pattern is progressive sob with activity but ok at rest p saba neb.  No obvious day to day or daytime variability or assoc  Mucus plug/cp or chest tightness,   or overt sinus or hb  symptoms. No unusual exp hx or h/o childhood pna/ asthma or knowledge of premature birth.  Sleeping ok without nocturnal  or early am exacerbation  of respiratory  c/o's or need for noct saba. Also denies any obvious fluctuation of symptoms with weather or environmental changes or other aggravating or alleviating factors except as outlined above   Current Medications, Allergies, Complete Past Medical History, Past Surgical History, Family History, and Social History were reviewed in [employer] record.  ROS  The following are not active complaints unless bolded sore throat, dysphagia, dental problems, itching, sneezing,  nasal congestion or excess/ purulent secretions, ear ache,   fever, chills, sweats, unintended wt loss, classically pleuritic or exertional cp, hemoptysis,  orthopnea pnd or leg swelling, presyncope, palpitations, abdominal pain,  anorexia, nausea, vomiting, diarrhea  or change in bowel or bladder habits, change in stools or urine, dysuria,hematuria,  rash, arthralgias, visual complaints, headache, numbness, weakness or ataxia or problems with walking or coordination,  change in mood/affect or memory.                  Objective:   Physical Exam   obese wm nad   04/28/2016      237   03/11/16 234 lb 6.4 oz (106.323 kg)  03/01/16 237 lb (107.502 kg)  02/09/16 237 lb (107.502 kg)    Vital signs reviewed   HEENT: nl dentition, turbinates, and oropharynx. Nl external ear canals without cough reflex   NECK :  without JVD/Nodes/TM/ nl carotid upstrokes bilaterally   LUNGS: no acc muscle use,  Nl contour chest with bilateral mild/ moderate insp and exp rhonchi    CV:  RRR  no s3 or murmur or increase in P2, no edema   ABD:  soft and nontender with nl inspiratory excursion in the supine position. No bruits or organomegaly, bowel sounds nl  MS:  Nl gait/ ext warm without deformities, calf tenderness, cyanosis or clubbing No obvious joint restrictions   SKIN: warm and dry without lesions    NEURO:  alert, approp, nl sensorium with  no motor deficits       CXR PA and Lateral:   04/28/2016 :    I personally reviewed images and agree with radiology impression as follows:    COPD with bibasilar scarring and chronically increased interstitial markings elsewhere. Previous granulomatous infection. There is no alveolar pneumonia nor CHF.          Assessment:

## 2016-04-29 NOTE — Progress Notes (Signed)
Quick Note:  Spoke with pt and notified of results per Dr. Wert. Pt verbalized understanding and denied any questions.  ______ 

## 2016-04-30 ENCOUNTER — Telehealth: Payer: Self-pay | Admitting: Pulmonary Disease

## 2016-04-30 ENCOUNTER — Encounter: Payer: Self-pay | Admitting: Internal Medicine

## 2016-04-30 DIAGNOSIS — J9611 Chronic respiratory failure with hypoxia: Secondary | ICD-10-CM

## 2016-04-30 NOTE — Assessment & Plan Note (Signed)
Adequate control on present rx, reviewed > no change in rx needed   

## 2016-04-30 NOTE — Telephone Encounter (Signed)
Spoke with pt. States that he wants to get a POC from [facility]. He will be paying out of pocket for this, he will not be going through his insurance. Advised pt that we would get an order over to [facility]. Nothing further was needed.

## 2016-04-30 NOTE — Assessment & Plan Note (Addendum)
GOLD II at baseline spirometry 2011  - quit smoking 2012   DDX of  difficult airways management almost all start with A and  include Adherence, Ace Inhibitors, Acid Reflux, Active Sinus Disease, Alpha 1 Antitripsin deficiency, Anxiety masquerading as Airways dz,  ABPA,  Allergy(esp in young), Aspiration (esp in elderly), Adverse effects of meds,  Active smokers, A bunch of PE's (a small clot burden can't cause this syndrome unless there is already severe underlying pulm or vascular dz with poor reserve) plus two Bs  = Bronchiectasis and Beta blocker use..and one C= CHF    Adherence is always the initial "prime suspect" and is a multilayered concern that requires a "trust but verify" approach in every patient - starting with knowing how to use medications, especially inhalers, correctly, keeping up with refills and understanding the fundamental difference between maintenance and prns vs those medications only taken for a very short course and then stopped and not refilled.  - The proper method of use, as well as anticipated side effects, of a metered-dose inhaler are discussed and demonstrated to the patient. Improved effectiveness after extensive coaching during this visit to a level of approximately 90 % from a baseline of 75 % > continue rx    ? Acid (or non-acid) GERD > always difficult to exclude as up to 75% of pts in some series report no assoc GI/ Heartburn symptoms> rec max (24h)  acid suppression and diet restrictions/ reviewed and instructions given in writing.   ? Acute sinusitis/bronchitis > doubt sign infection so just rx zpak  ? Allergy /asthma component > Prednisone 10 mg take  4 each am x 2 days,   2 each am x 2 days,  1 each am x 2 days and stop    I had an extended discussion with the patient reviewing all relevant studies completed to date and  lasting 15 to 20 minutes of a 25 minute visit    Each maintenance medication was reviewed in detail including most importantly the  difference between maintenance and prns and under what circumstances the prns are to be triggered using an action plan format that is not reflected in the computer generated alphabetically organized AVS.    Please see instructions for details which were reviewed in writing and the patient given a copy highlighting the part that I personally wrote and discussed at today's ov.

## 2016-05-06 ENCOUNTER — Telehealth: Payer: Self-pay | Admitting: Pulmonary Disease

## 2016-05-06 MED ORDER — PREDNISONE 10 MG PO TABS: ORAL_TABLET | ORAL | Status: DC

## 2016-05-06 NOTE — Telephone Encounter (Signed)
He should continue flutter valve, mucinex, nebulizer treatments.  Can send script for additional prednisone 10 mg pills >> 2 pills daily for 2 days, 1 pill daily for 2 days, 1/2 pill daily for 2 days.

## 2016-05-06 NOTE — Telephone Encounter (Signed)
Pt is aware of VS recommendation. Rx has been sent in . Nothing further was needed. 

## 2016-05-06 NOTE — Telephone Encounter (Signed)
Spoke with pt. States that his chest congestion has returned. Reports chest congestion and cough. Cough is producing clear mucus. Denies chest tightness, wheezing or SOB. Would like another round of antibiotics and prednisone. MW gave him Zpack and pred taper last week.  VS - please advise. Thanks.

## 2016-05-17 ENCOUNTER — Other Ambulatory Visit: Payer: Self-pay | Admitting: Family Medicine

## 2016-05-27 ENCOUNTER — Other Ambulatory Visit: Payer: Self-pay | Admitting: Family Medicine

## 2016-05-28 ENCOUNTER — Telehealth: Payer: Self-pay | Admitting: Family Medicine

## 2016-05-28 NOTE — Telephone Encounter (Signed)
Pharmacy [facility] [address]  Patient calling to get refill on his sucralfate 1 gram tab if possible  [phone]

## 2016-05-30 ENCOUNTER — Other Ambulatory Visit: Payer: Self-pay | Admitting: Family Medicine

## 2016-05-31 ENCOUNTER — Other Ambulatory Visit: Payer: Self-pay | Admitting: Pulmonary Disease

## 2016-05-31 MED ORDER — SUCRALFATE 1 G PO TABS: 1.0000 g | ORAL_TABLET | Freq: Four times a day (QID) | ORAL | Status: DC

## 2016-05-31 NOTE — Telephone Encounter (Signed)
Medication called/sent to requested pharmacy  

## 2016-06-03 ENCOUNTER — Other Ambulatory Visit: Payer: Self-pay | Admitting: Internal Medicine

## 2016-06-17 ENCOUNTER — Encounter: Payer: Medicare Other | Admitting: Family Medicine

## 2016-06-21 ENCOUNTER — Other Ambulatory Visit: Payer: Self-pay | Admitting: Family Medicine

## 2016-06-21 NOTE — Telephone Encounter (Signed)
Refill appropriate and filled per protocol. 

## 2016-06-23 ENCOUNTER — Telehealth: Payer: Self-pay | Admitting: Pulmonary Disease

## 2016-06-23 MED ORDER — AZITHROMYCIN 250 MG PO TABS: ORAL_TABLET | ORAL | Status: DC

## 2016-06-23 NOTE — Telephone Encounter (Signed)
Okay to send order for Zpak.

## 2016-06-23 NOTE — Telephone Encounter (Signed)
Spoke with pt, c/o prod cough with clear/white mucus, increased SOB, PND, chest tightness X3 days.  Denies fever, chills, chest pain.   Pt has an appt on 06/28/16 but is requesting a zpak to get him through the weekend until his appt.   [facility] [address].    VS please advise on recs.  Thanks!

## 2016-06-23 NOTE — Telephone Encounter (Signed)
rx sent to pharmacy, pt aware.  Nothing further needed.

## 2016-06-28 ENCOUNTER — Ambulatory Visit (INDEPENDENT_AMBULATORY_CARE_PROVIDER_SITE_OTHER): Payer: Medicare Other | Admitting: Pulmonary Disease

## 2016-06-28 ENCOUNTER — Encounter: Payer: Self-pay | Admitting: Pulmonary Disease

## 2016-06-28 VITALS — BP 112/62 | HR 109 | Ht 64.0 in | Wt 244.0 lb

## 2016-06-28 DIAGNOSIS — J45909 Unspecified asthma, uncomplicated: Secondary | ICD-10-CM | POA: Diagnosis not present

## 2016-06-28 DIAGNOSIS — J9611 Chronic respiratory failure with hypoxia: Secondary | ICD-10-CM

## 2016-06-28 DIAGNOSIS — J449 Chronic obstructive pulmonary disease, unspecified: Secondary | ICD-10-CM

## 2016-06-28 DIAGNOSIS — J301 Allergic rhinitis due to pollen: Secondary | ICD-10-CM | POA: Diagnosis not present

## 2016-06-28 NOTE — Patient Instructions (Signed)
Check with your insurance about alternatives to symbicort.  Also ask if nebulizer medications can go through medicare part B  Will arrange for referral to allergist  Follow up in 3 months

## 2016-06-28 NOTE — Progress Notes (Signed)
Current Outpatient Prescriptions on File Prior to Visit  Medication Sig  . albuterol (PROAIR HFA) 108 (90 Base) MCG/ACT inhaler INHALE 2 PUFFS INTO LUNGS EVERY 4 HOURS AS NEEDED for wheezing when not at home/unable to use albuterol nebulizer - DO NOT USE BOTH THIS INHALER AND YOUR ALBUTEROL NEBULIZER at the same time - they both provide the same medicine  . albuterol (PROVENTIL) (2.5 MG/3ML) 0.083% nebulizer solution USE 1 VIAL IN NEBULIZER EVERY 4 HOURS AS NEEDED FOR WHEEZING/SHORTNESS OF BREATH  . atorvastatin (LIPITOR) 10 MG tablet TAKE 0.5 TABLETS (5 MG TOTAL) BY MOUTH DAILY.  . clindamycin (CLEOCIN) 150 MG capsule Reported on 03/11/2016  . clotrimazole-betamethasone (LOTRISONE) cream APPLY TWICE A DAY FOR 10 TO 14 DAYS AS DIRECTED  . diclofenac (VOLTAREN) 75 MG EC tablet TAKE 1 TABLET BY MOUTH TWICE A DAY  . Diphenhyd-Hydrocort-Nystatin (FIRST-DUKES MOUTHWASH) SUSP Swish and swallow 5cc TID  . DULoxetine (CYMBALTA) 60 MG capsule Take 1 capsule by mouth daily.  . fluticasone (FLONASE) 50 MCG/ACT nasal spray Place 2 sprays into both nostrils daily.  . furosemide (LASIX) 40 MG tablet 3 tabs in the morning and 2 in the afternoon  . Guaifenesin (MUCINEX MAXIMUM STRENGTH) 1200 MG TB12 Take 1 tablet (1,200 mg total) by mouth 2 (two) times daily as needed (as needed for congestion).  Marland Kitchen. HYDROcodone-acetaminophen (NORCO) 10-325 MG tablet Take 1 tablet by mouth every 4 (four) hours as needed (cough not responding to mucinex dm).  Marland Kitchen. ketorolac (TORADOL) 10 MG tablet Take 1 tablet (10 mg total) by mouth every 6 (six) hours as needed.  Marland Kitchen. KLOR-CON M20 20 MEQ tablet TAKE ONE TABLET BY MOUTH DAILY  . levothyroxine (SYNTHROID, LEVOTHROID) 200 MCG tablet TAKE 1 TABLET BY MOUTH EVERY DAY  . loratadine (CLARITIN) 10 MG tablet Take 10 mg by mouth daily.    Marland Kitchen. LORazepam (ATIVAN) 0.5 MG tablet Take 1 tablet (0.5 mg total) by mouth every 8 (eight) hours as needed for anxiety.  . Misc. Devices (ACAPELLA) MISC Dx 496  .  montelukast (SINGULAIR) 10 MG tablet TAKE 1 TABLET (10 MG TOTAL) BY MOUTH AT BEDTIME.  . pantoprazole (PROTONIX) 40 MG tablet TAKE 1 TABLET (40 MG TOTAL) BY MOUTH DAILY. TAKE 30-60 MIN BEFORE FIRST MEAL OF THE DAY  . ranitidine (ZANTAC) 150 MG tablet Take 150 mg by mouth at bedtime.    . sucralfate (CARAFATE) 1 g tablet Take 1 tablet (1 g total) by mouth 4 (four) times daily.  . SYMBICORT 160-4.5 MCG/ACT inhaler INHALE 2 PUFFS INTO LUNGS TWICE DAILY  . tamsulosin (FLOMAX) 0.4 MG CAPS capsule TAKE 1 CAPSULE (0.4 MG TOTAL) BY MOUTH DAILY.  Marland Kitchen. Tiotropium Bromide Monohydrate (SPIRIVA RESPIMAT) 2.5 MCG/ACT AERS 2 puffs each am  . tiZANidine (ZANAFLEX) 4 MG capsule Take 4 mg by mouth 2 (two) times daily.  . valsartan (DIOVAN) 160 MG tablet TAKE 1 TABLET (160 MG TOTAL) BY MOUTH DAILY.   No current facility-administered medications on file prior to visit.    Chief Complaint  Patient presents with  . Follow-up    pt c/o cough, wheezing and SOB x 1 week. Pt completed Zpak 06/27/16 - feels that breathing is slowly returning to baseline. O2 upon arrival to exam room was 78% on 3 liters. Pt states that normally he does not this SOB walking to the exam room. Using all inhalers as directed.     Pulmonary tests PFT 12/24/09>>FEV1 1.60(57%), FEV1% 52, TLC 6.86(130%), DLCO 67%, no BD RAST 03/11/16 >> IgE 359,  multiple allergies to grass/trees  Sleep tests PSG with 3 liters oxygen 06/12/12>>AHI 1.2, SpO2 low 90%, PLMI 1  Cardiac tests Echo 12/31/15 >> EF 55 to 60%, grade 1 diastolic CHF  Past medical history Anxiety, hypothyroidism, HTN, HLD, BPH  PSHx, Medications, Allergies, Fhx, Shx reviewed.  Vital signs BP 112/62 mmHg  Pulse 109  Ht  (1.626 m)  Wt 244 lb (110.678 kg)  BMI 41.86 kg/m2  SpO2 93%   History of Present Illness: [name] is a 65 y.o. male former smoker with GOLD 3 COPD, and hypoxia.  He called office last week >> got zpak.  Feels this helped.  He has been seen by  several other providers since I last saw him.  He has frequent sinus infections that then settle into his chest.  He was on allergy shots when he was younger.  He had RAST in March >> positive for grasses and trees.  He feels his current inhaler regimen works, but is very expensive.  Physical Exam:  General - wearing oxygen ENT - no sinus tenderness, no oral exudate, no LAN Cardiac - s1s2 regular, no murmur Chest - Prolonged exhalation, decreased breath sounds, no wheeze/rales Back - No focal tenderness Abd - Soft, non-tender Ext - mild ankle edema Neuro - Normal strength Skin - No rashes Psych - Normal mood, and behavior   Impression:  COPD with asthma. - continue spiriva, symbicort, singulair, and prn albuterol - he will check with his insurance about whether there are less expensive options for his inhalers - continue flutter valve  Allergic rhinitis with positive RAST and elevated IgE. Upper airway cough syndrome with post-nasal drip. - continue nasacort, singulair, and claritin - will refer him to allergist  Chronic respiratory failure with hypoxia. - he is to continue 3 liters oxygen 24/7  Lung cancer screening. - He quit smoking in 2012.  He has 40 pack yr hx of smoking. - he will contact Kandice Robinsons, NP to set up low dose CT chest screening program once he has decided he wants to do this   Patient Instructions  Check with your insurance about alternatives to symbicort.  Also ask if nebulizer medications can go through medicare part B  Will arrange for referral to allergist  Follow up in 3 months     Coralyn Helling, MD [facility] Pager:  [phone] 06/28/2016, 5:12 PM

## 2016-06-29 ENCOUNTER — Ambulatory Visit (INDEPENDENT_AMBULATORY_CARE_PROVIDER_SITE_OTHER): Payer: Medicare Other | Admitting: Family Medicine

## 2016-06-29 ENCOUNTER — Other Ambulatory Visit: Payer: Self-pay | Admitting: Family Medicine

## 2016-06-29 ENCOUNTER — Encounter: Payer: Self-pay | Admitting: Family Medicine

## 2016-06-29 VITALS — BP 108/68 | HR 94 | Temp 98.2°F | Ht 64.0 in

## 2016-06-29 DIAGNOSIS — E785 Hyperlipidemia, unspecified: Secondary | ICD-10-CM

## 2016-06-29 DIAGNOSIS — Z23 Encounter for immunization: Secondary | ICD-10-CM

## 2016-06-29 DIAGNOSIS — Z0000 Encounter for general adult medical examination without abnormal findings: Secondary | ICD-10-CM | POA: Diagnosis not present

## 2016-06-29 DIAGNOSIS — Z1159 Encounter for screening for other viral diseases: Secondary | ICD-10-CM

## 2016-06-29 DIAGNOSIS — Z125 Encounter for screening for malignant neoplasm of prostate: Secondary | ICD-10-CM

## 2016-06-29 LAB — CBC WITH DIFFERENTIAL/PLATELET
Basophils Absolute: 65 cells/uL (ref 0–200)
Basophils Relative: 1 %
EOS PCT: 4 %
Eosinophils Absolute: 260 cells/uL (ref 15–500)
HEMATOCRIT: 42.9 % (ref 38.5–50.0)
HEMOGLOBIN: 14.1 g/dL (ref 13.0–17.0)
LYMPHS ABS: 1625 {cells}/uL (ref 850–3900)
Lymphocytes Relative: 25 %
MCH: 30.5 pg (ref 27.0–33.0)
MCHC: 32.9 g/dL (ref 32.0–36.0)
MCV: 92.7 fL (ref 80.0–100.0)
MPV: 10.8 fL (ref 7.5–12.5)
Monocytes Absolute: 520 cells/uL (ref 200–950)
Monocytes Relative: 8 %
NEUTROS ABS: 4030 {cells}/uL (ref 1500–7800)
NEUTROS PCT: 62 %
PLATELETS: 218 10*3/uL (ref 140–400)
RBC: 4.63 MIL/uL (ref 4.20–5.80)
RDW: 15.7 % — AB (ref 11.0–15.0)
WBC: 6.5 10*3/uL (ref 3.8–10.8)

## 2016-06-29 NOTE — Addendum Note (Signed)
Addended by: Legrand RamsWILLIS, SANDY B on: 06/29/2016 11:26 AM   Modules accepted: Orders
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
## 2016-06-30 ENCOUNTER — Other Ambulatory Visit: Payer: Self-pay | Admitting: Acute Care

## 2016-06-30 LAB — COMPLETE METABOLIC PANEL WITH GFR
ALT: 19 U/L (ref 9–46)
AST: 15 U/L (ref 10–35)
Albumin: 4.2 g/dL (ref 3.6–5.1)
Alkaline Phosphatase: 88 U/L (ref 40–115)
BILIRUBIN TOTAL: 0.4 mg/dL (ref 0.2–1.2)
BUN: 15 mg/dL (ref 7–25)
CHLORIDE: 98 mmol/L (ref 98–110)
CO2: 32 mmol/L — AB (ref 20–31)
CREATININE: 0.74 mg/dL (ref 0.70–1.25)
Calcium: 9.3 mg/dL (ref 8.6–10.3)
GFR, Est African American: >89 mL/min (ref >=60)
GLUCOSE: 111 mg/dL — AB (ref 70–99)
Potassium: 4 mmol/L (ref 3.5–5.3)
SODIUM: 142 mmol/L (ref 135–146)
TOTAL PROTEIN: 6.6 g/dL (ref 6.1–8.1)

## 2016-06-30 LAB — LIPID PANEL
Cholesterol: 148 mg/dL (ref 125–200)
HDL: 33 mg/dL — AB (ref >=40)
LDL Cholesterol: 72 mg/dL (ref <130)
TRIGLYCERIDES: 214 mg/dL — AB (ref <150)
Total CHOL/HDL Ratio: 4.5 Ratio (ref <=5.0)
VLDL: 43 mg/dL — AB (ref <30)

## 2016-06-30 LAB — PSA: PSA: 1.39 ng/mL (ref <=4.00)

## 2016-06-30 LAB — HEPATITIS C ANTIBODY: HCV Ab: NEGATIVE

## 2016-06-30 LAB — TSH: TSH: 2.34 mIU/L (ref 0.40–4.50)

## 2016-07-01 NOTE — Telephone Encounter (Signed)
Pt requesting refill on Ativan. Refill called in to pharmacy. Nothing further needed.

## 2016-07-02 LAB — HEMOGLOBIN A1C
Hgb A1c MFr Bld: 6.2 % — ABNORMAL HIGH (ref <5.7)
MEAN PLASMA GLUCOSE: 131 mg/dL

## 2016-07-06 ENCOUNTER — Other Ambulatory Visit: Payer: Self-pay | Admitting: Internal Medicine

## 2016-07-06 MED ORDER — PANTOPRAZOLE SODIUM 40 MG PO TBEC: 40.0000 mg | DELAYED_RELEASE_TABLET | Freq: Every day | ORAL | 2 refills | Status: DC

## 2016-07-07 ENCOUNTER — Other Ambulatory Visit: Payer: Self-pay | Admitting: Pulmonary Disease

## 2016-07-31 ENCOUNTER — Other Ambulatory Visit: Payer: Self-pay | Admitting: Family Medicine

## 2016-08-02 NOTE — Telephone Encounter (Signed)
Medication refilled per protocol. 

## 2016-08-10 ENCOUNTER — Ambulatory Visit: Payer: Medicare Other | Admitting: Allergy and Immunology

## 2016-08-11 ENCOUNTER — Encounter: Payer: Self-pay | Admitting: Internal Medicine

## 2016-08-11 ENCOUNTER — Ambulatory Visit (INDEPENDENT_AMBULATORY_CARE_PROVIDER_SITE_OTHER)
Admission: RE | Admit: 2016-08-11 | Discharge: 2016-08-11 | Disposition: A | Discharge status: Home / Self Care | Payer: Medicare Other | Source: Ambulatory Visit | Attending: Internal Medicine | Admitting: Internal Medicine

## 2016-08-11 ENCOUNTER — Ambulatory Visit (INDEPENDENT_AMBULATORY_CARE_PROVIDER_SITE_OTHER): Payer: Medicare Other | Admitting: Internal Medicine

## 2016-08-11 VITALS — BP 94/60 | HR 84 | Ht 64.0 in | Wt 247.6 lb

## 2016-08-11 DIAGNOSIS — J9612 Chronic respiratory failure with hypercapnia: Secondary | ICD-10-CM

## 2016-08-11 DIAGNOSIS — J9611 Chronic respiratory failure with hypoxia: Secondary | ICD-10-CM

## 2016-08-11 DIAGNOSIS — J441 Chronic obstructive pulmonary disease with (acute) exacerbation: Secondary | ICD-10-CM

## 2016-08-11 MED ORDER — PREDNISONE 10 MG PO TABS: ORAL_TABLET | ORAL | 0 refills | Status: DC

## 2016-08-11 MED ORDER — AZITHROMYCIN 250 MG PO TABS: ORAL_TABLET | ORAL | 0 refills | Status: DC

## 2016-08-11 NOTE — Patient Instructions (Addendum)
Plan A = Automatic = Continue Symbicort 160 Take 2 puffs first thing in am and then another 2 puffs about 12 hours later. Spiriva 2 pffs and Pantoprazole 40 mg Take 30-60 min before first meal of the day plus continue  zantac at bedtime     Plan B = Backup Only use your albuterol (proair) as a rescue medication to be used if you can't catch your breath by resting or doing a relaxed purse lip breathing pattern.  - The less you use it, the better it will work when you need it. - Ok to use up to 2 puffs  every 4 hours if you must but call for appointment if use goes up over your usual need - Don't leave home without it !!  (think of it like the spare tire for your car)   Plan C = Crisis - only use your albuterol nebulizer if you first try Plan B and it fails to help > ok to use the nebulizer up to every 4 hours but if start needing it regularly call for immediate appointment  For cough > mucinex dm 1200 mg every 12 hours and supplement with norco if still coughing and use the flutter as much as you can  zpak  Prednisone 10 mg take  4 each am x 2 days,   2 each am x 2 days,  1 each am x 2 days and stop   GERD (REFLUX)  is an extremely common cause of respiratory symptoms just like yours , many times with no obvious heartburn at all.    It can be treated with medication, but also with lifestyle changes including elevation of the head of your bed (ideally with 6 inch  bed blocks),  Smoking cessation, avoidance of late meals, excessive alcohol, and avoid fatty foods, chocolate, peppermint, colas, red wine, and acidic juices such as orange juice.  NO MINT OR MENTHOL PRODUCTS SO NO COUGH DROPS   USE SUGARLESS CANDY INSTEAD (Jolley ranchers or Stover's or Life Savers) or even ice chips will also do - the key is to swallow to prevent all throat clearing. NO OIL BASED VITAMINS - use powdered substitutes.   Please remember to go to the  x-ray department downstairs for your tests - we will call you with  the results when they are available.     if can't get comfortable at rest after the neb will need to go to ER

## 2016-08-11 NOTE — Progress Notes (Signed)
Subjective:     Patient ID: [name], male   DOB: 1951/08/11,  MRN: [id number]  HPI  HPI 65 yo quit smoking  2012   with GOLD 2 COPD    02/09/2016 Follow up : COPD Exacerbation/PNA  Pt returns for follow up for COPD exacerbation  Recent admission for COPD flare and PNA .  He was treated with abx. And diuresis  He is feeling better. Decreased cough and congestion .  CXR today shows stable basilar density c/w probable scarring.  Remains on Symbicort and Spiriva .  On O2 at 3l/m . [place] marathon works well.  Vaccines utd.  Denies chest pain, orthopnea, edema or hemoptysis.        Immunization History  Administered Date(s) Administered  . Influenza Split 10/25/2011  . Influenza Whole 09/10/2009, 09/04/2010, 09/12/2012  . Influenza,inj,Quad PF,36+ Mos 10/17/2013, 09/25/2014, 10/22/2015  . Pneumococcal Conjugate-13 01/28/2015  . Pneumococcal Polysaccharide-23 12/13/2006    Tests: PFT 12/24/09>>FEV1 1.60(57%), FEV1% 52, TLC 6.86(130%), DLCO 67%, no BD PSG with 3 liters oxygen 06/12/12>>AHI 1.2, SpO2 low 90%, PLMI 1. Echo 09/27/12>>EF 55 to 60%, mild RA dilation Echo 12/2015 EF 55-60%, Gr 1 DD       rec Continue on Symbicort and Spiriva .  Low salt diet  Continue on Oxygen 3l/m    03/11/2016 acute extended ov/[name] re:  Chief Complaint  Patient presents with  . Acute Visit    increased SOB x3 days with decreased mucus production (clear/milky), tightness, occasional wheezing.  takes 3x as long to recover from exertion.  denies any f/c/s, hemoptysis   was "better"   on 3lpm 24/7 and neb 3 x neb despite symbicort/spiriva>>  then worse x 3 days assoc with dysphagia despite taking carafate qid rec Plan A = Automatic = Symbicort 160 Take 2 puffs first thing in am and then another 2 puffs about 12 hours later.                                      Spiriva 2 pffs and Pantoprazole 40 mg Take 30-60 min before first meal of the day plus continue  zantac  at bedtime  Plan B = Backup Only use your albuterol (proair) as a rescue medication  Plan C = Crisis - only use your albuterol nebulizer if you first try Plan B and it fails to help > ok to use the nebulizer up to every 4 hours but if start needing it regularly call for immediate appointment Prednisone 10 mg take  4 each am x 2 days,   2 each am x 2 days,  1 each am x 2 days and stop  GERD  Diet     04/28/2016 acute ext ov/[name] re: GOLD II at baseline/aecopd maint rx symb 160 2bid/ spriiva respimat Chief Complaint  Patient presents with  . Acute Visit    Pt c/o increased SOB, cough with milky mucus, increased HR and decreased O2 with exertion. Pt denies CP/tightness, f/n/v. Pt has been using flutter valve daily.   back to baseline and not needing much saba at all until sob/cough worse one week prior to OV  - has futter not using  rec Plan A = Automatic = Symbicort 160 Take 2 puffs first thing in am and then another 2 puffs about 12 hours later.   Spiriva 2 pffs and Pantoprazole 40 mg Take 30-60 min before first meal of the  day plus continue  zantac at bedtime  Plan B = Backup Only use your albuterol (proair)  Plan C = Crisis - only use your albuterol nebulizer if you first try Plan B and it fails to help > ok to use the nebulizer up to every 4 hours but if start needing it regularly call for immediate appointment For cough > mucinex dm 1200 mg every 12 hours and supplement with norco if still coughing and use the flutter as much as you can zpak Prednisone 10 mg take  4 each am x 2 days,   2 each am x 2 days,  1 each am x 2 days and stop  GERD diet    08/11/2016  Acute ov/[name] re:  AECOPD/ GOLD II symb/spiriva/singulair  Chief Complaint  Patient presents with  . Acute Visit    Pt c/o increased cough and SOB for the past 2 days. He is coughing up large amounts of clear/milky sputum.    at baseline maybe one saba hfa / 02 3lpm 24/7 Already on norco 4 x daily at baseline Abrupt onset x  2 days worse sob with  Cough> milky sputum only, nothing really purulent  Lots of lifesavers but mint  Comfortable at rest p saba    No obvious day to day or daytime variability or assoc  Mucus plug/cp or chest tightness,   or overt sinus or hb symptoms. No unusual exp hx or h/o childhood pna/ asthma or knowledge of premature birth.  Sleeping ok without nocturnal  or early am exacerbation  of respiratory  c/o's or need for noct saba. Also denies any obvious fluctuation of symptoms with weather or environmental changes or other aggravating or alleviating factors except as outlined above   Current Medications, Allergies, Complete Past Medical History, Past Surgical History, Family History, and Social History were reviewed in [employer] record.  ROS  The following are not active complaints unless bolded sore throat, dysphagia, dental problems, itching, sneezing,  nasal congestion or excess/ purulent secretions, ear ache,   fever, chills, sweats, unintended wt loss, classically pleuritic or exertional cp, hemoptysis,  orthopnea pnd or leg swelling, presyncope, palpitations, abdominal pain, anorexia, nausea, vomiting, diarrhea  or change in bowel or bladder habits, change in stools or urine, dysuria,hematuria,  rash, arthralgias, visual complaints, headache, numbness, weakness or ataxia or problems with walking or coordination,  change in mood/affect or memory.                  Objective:   Physical Exam   obese wm nad  08/11/2016      248  04/28/2016      237   03/11/16 234 lb 6.4 oz (106.323 kg)  03/01/16 237 lb (107.502 kg)  02/09/16 237 lb (107.502 kg)    Vital signs reviewed - sats 89% on 3lpm np  HEENT: nl dentition, turbinates, and oropharynx. Nl external ear canals without cough reflex   NECK :  without JVD/Nodes/TM/ nl carotid upstrokes bilaterally   LUNGS: no acc muscle use,  Nl contour chest which is clear to A and P bilaterally without cough on insp  or exp maneuvers   CV:  RRR  no s3 or murmur or increase in P2, no edema   ABD:  soft and nontender with nl inspiratory excursion in the supine position. No bruits or organomegaly, bowel sounds nl  MS:  Nl gait/ ext warm without deformities, calf tenderness, cyanosis or clubbing No obvious joint restrictions  SKIN: warm and dry without lesions    NEURO:  alert, approp, nl sensorium with  no motor deficits        CXR PA and Lateral:   08/11/2016 :    I personally reviewed images and agree with radiology impression as follows:    Low lung volumes with mild bibasilar atelectasis and/or scarring . Tiny calcified pulmonary nodules noted consistent with granulomas.

## 2016-08-13 ENCOUNTER — Telehealth: Payer: Self-pay | Admitting: Pulmonary Disease

## 2016-08-13 ENCOUNTER — Other Ambulatory Visit: Payer: Self-pay | Admitting: Pulmonary Disease

## 2016-08-13 ENCOUNTER — Other Ambulatory Visit: Payer: Self-pay | Admitting: Family Medicine

## 2016-08-13 ENCOUNTER — Encounter: Payer: Self-pay | Admitting: Internal Medicine

## 2016-08-13 NOTE — Telephone Encounter (Signed)
Called spoke with pt. Informed him that medication can not be called into the pharmacy and there is no doctor in the office at this time. I explained to him that medication will need to be printed and placed at the front for pick up on 08/17/16. He voiced understanding and had no further questions. Will hold message in triage for follow up.

## 2016-08-13 NOTE — Assessment & Plan Note (Signed)
HCO3  06/29/16  = 32  C/w hypercarbia     So target sats upper80's/ lower 90's > no change rx needed > f/u with Dr Craige CottaSood planned

## 2016-08-13 NOTE — Telephone Encounter (Signed)
Ok to give an extra 2 per 24h to cover severe coughing fits so that would be #60 for 30 day supply no refills

## 2016-08-13 NOTE — Assessment & Plan Note (Addendum)
GOLD II at baseline spirometry 2011  - quit smoking 2012  08/11/2016   After extensive coaching HFA effectiveness =    90%  - Rx Zpak/ pred 08/11/2016 >>>  Recurrent acute flare in setting of good baseline function and minimal need for saba     DDX of  difficult airways management almost all start with A and  include Adherence, Ace Inhibitors, Acid Reflux, Active Sinus Disease, Alpha 1 Antitripsin deficiency, Anxiety masquerading as Airways dz,  ABPA,  Allergy(esp in young), Aspiration (esp in elderly), Adverse effects of meds,  Active smokers, A bunch of PE's (a small clot burden can't cause this syndrome unless there is already severe underlying pulm or vascular dz with poor reserve) plus two Bs  = Bronchiectasis and Beta blocker use..and one C= CHF  Adherence is always the initial "prime suspect" and is a multilayered concern that requires a "trust but verify" approach in every patient - starting with knowing how to use medications, especially inhalers, correctly, keeping up with refills and understanding the fundamental difference between maintenance and prns vs those medications only taken for a very short course and then stopped and not refilled.  - - The proper method of use, as well as anticipated side effects, of a metered-dose inhaler are discussed and demonstrated to the patient. Improved effectiveness after extensive coaching during this visit to a level of approximately 90 % from a baseline of 75 % > continue symb/spiriva respimat - reviewed approp use of flutter   ? Acid (or non-acid) GERD > always difficult to exclude as up to 75% of pts in some series report no assoc GI/ Heartburn symptoms> rec continue max (24h)  acid suppression and diet restrictions/ reviewed     ? Allergy > continue singulair/ Prednisone 10 mg take  4 each am x 2 days,   2 each am x 2 days,  1 each am x 2 days and stop   ? Active sinus dz > doubt/ ok to try zpak again     Group D in terms of symptom/risk and  laba/lama/ICS  therefore appropriate rx at this point.      I had an extended discussion with the patient reviewing all relevant studies completed to date and  lasting 15 to 20 minutes of a 25 minute visit    Each maintenance medication was reviewed in detail including most importantly the difference between maintenance and prns and under what circumstances the prns are to be triggered using an action plan format that is not reflected in the computer generated alphabetically organized AVS.    Please see instructions for details which were reviewed in writing and the patient given a copy highlighting the part that I personally wrote and discussed at today's ov.

## 2016-08-13 NOTE — Telephone Encounter (Signed)
Spoke with pt, states that at his office visit on 8/30 MW told him to take an extra hydrocodone 10/325 to help with his breathing.  Pt takes this medication for his chronic shoulder pain.  Pt states that he takes 4 tabs of this medication already, and that if he takes any extra medication that he will run out early.  Pt wants to know if MW will write him a supplemental rx of medication.    MW please advise.  Thanks!   Instructions  Patient Instructions   Plan A = Automatic = Continue Symbicort 160 Take 2 puffs first thing in am and then another 2 puffs about 12 hours later. Spiriva 2 pffs and Pantoprazole 40 mg Take 30-60 min before first meal of the day plus continue  zantac at bedtime      Plan B = Backup Only use your albuterol (proair) as a rescue medication to be used if you can't catch your breath by resting or doing a relaxed purse lip breathing pattern.  - The less you use it, the better it will work when you need it. - Ok to use up to 2 puffs  every 4 hours if you must but call for appointment if use goes up over your usual need - Don't leave home without it !!  (think of it like the spare tire for your car)    Plan C = Crisis - only use your albuterol nebulizer if you first try Plan B and it fails to help > ok to use the nebulizer up to every 4 hours but if start needing it regularly call for immediate appointment   For cough > mucinex dm 1200 mg every 12 hours and supplement with norco if still coughing and use the flutter as much as you can   zpak   Prednisone 10 mg take  4 each am x 2 days,   2 each am x 2 days,  1 each am x 2 days and stop    GERD (REFLUX)  is an extremely common cause of respiratory symptoms just like yours , many times with no obvious heartburn at all.     It can be treated with medication, but also with lifestyle changes including elevation of the head of your bed (ideally with 6 inch  bed blocks),  Smoking cessation, avoidance of late meals, excessive  alcohol, and avoid fatty foods, chocolate, peppermint, colas, red wine, and acidic juices such as orange juice.  NO MINT OR MENTHOL PRODUCTS SO NO COUGH DROPS   USE SUGARLESS CANDY INSTEAD (Jolley ranchers or Stover's or Life Savers) or even ice chips will also do - the key is to swallow to prevent all throat clearing. NO OIL BASED VITAMINS - use powdered substitutes.     Please remember to go to the  x-ray department downstairs for your tests - we will call you with the results when they are available.     if can't get comfortable at rest after the neb will need to go to ER

## 2016-08-17 MED ORDER — HYDROCODONE-ACETAMINOPHEN 10-325 MG PO TABS: ORAL_TABLET | ORAL | 0 refills | Status: DC

## 2016-08-17 NOTE — Telephone Encounter (Signed)
rx printed, signed, and left up front for pickup.  Pt aware.  Nothing further needed.

## 2016-08-20 ENCOUNTER — Telehealth: Payer: Self-pay | Admitting: Internal Medicine

## 2016-08-20 NOTE — Telephone Encounter (Signed)
Send Dr Ethelene Halamos a copy of this note  The extra doses were short term only to control coughing fits  and a one time rx, no further refills here of any narcotics

## 2016-08-20 NOTE — Telephone Encounter (Signed)
ATC Dr. Ethelene Halamos' office at [phone], office is currently closed.  Wcb.

## 2016-08-20 NOTE — Telephone Encounter (Signed)
Patient states that he is taking 4 tablets of Hydrocodone daily that he gets from [facility]. Patient states that Dr. Sherene SiresWert wrote Rx for 60 tablets.  He is supposed to only take controlled substances that are prescribed by Dr. Ethelene Halamos.  When he received this Rx through our office, Dr. Ethelene Halamos refused to refill his Hydrocodone.    Dr. Ethelene Halamos will only allow 4 tablets daily of Hydrocodone. He states that Dr. Sherene SiresWert wanted him to take 2 extra tablets daily.  Dr. Sherene SiresWert, please advise.  Plan A = Automatic = Continue Symbicort 160 Take 2 puffs first thing in am and then another 2 puffs about 12 hours later. Spiriva 2 pffs and Pantoprazole 40 mg Take 30-60 min before first meal of the day plus continue  zantac at bedtime     Plan B = Backup Only use your albuterol (proair) as a rescue medication to be used if you can't catch your breath by resting or doing a relaxed purse lip breathing pattern.  - The less you use it, the better it will work when you need it. - Ok to use up to 2 puffs  every 4 hours if you must but call for appointment if use goes up over your usual need - Don't leave home without it !!  (think of it like the spare tire for your car)   Plan C = Crisis - only use your albuterol nebulizer if you first try Plan B and it fails to help > ok to use the nebulizer up to every 4 hours but if start needing it regularly call for immediate appointment  For cough > mucinex dm 1200 mg every 12 hours and supplement with norco if still coughing and use the flutter as much as you can  zpak  Prednisone 10 mg take  4 each am x 2 days,   2 each am x 2 days,  1 each am x 2 days and stop   GERD (REFLUX)  is an extremely common cause of respiratory symptoms just like yours , many times with no obvious heartburn at all.    It can be treated with medication, but also with lifestyle changes including elevation of the head of your bed (ideally with 6 inch  bed blocks),  Smoking cessation, avoidance of late  meals, excessive alcohol, and avoid fatty foods, chocolate, peppermint, colas, red wine, and acidic juices such as orange juice.  NO MINT OR MENTHOL PRODUCTS SO NO COUGH DROPS   USE SUGARLESS CANDY INSTEAD (Jolley ranchers or Stover's or Life Savers) or even ice chips will also do - the key is to swallow to prevent all throat clearing. NO OIL BASED VITAMINS - use powdered substitutes.   Please remember to go to the  x-ray department downstairs for your tests - we will call you with the results when they are available.     if can't get comfortable at rest after the neb will need to go to ER

## 2016-08-22 ENCOUNTER — Other Ambulatory Visit: Payer: Self-pay | Admitting: Family Medicine

## 2016-08-23 NOTE — Telephone Encounter (Signed)
I spoke with Junious Dresseronnie at Dr. Ethelene Halamos' office. She states that they are denying to refill the prescription, the pt's pharmacy is. They state that they will not refill another prescription until the current prescription (MW's) is finished. I called and spoke with the pt. He is aware of the above information. Advised him when he needed a refill to contact Dr. Ethelene Halamos' office. He agreed and verbalized understanding. Nothing further was needed.

## 2016-08-23 NOTE — Telephone Encounter (Signed)
Connie from [facility] returned our call -pr

## 2016-08-23 NOTE — Telephone Encounter (Signed)
lmtcb x1 with Dr. Ethelene Halamos' office.

## 2016-08-23 NOTE — Telephone Encounter (Signed)
lmtcb x1 for Dr. Ethelene Halamos' office.

## 2016-09-01 ENCOUNTER — Telehealth: Payer: Self-pay | Admitting: Pulmonary Disease

## 2016-09-01 ENCOUNTER — Ambulatory Visit (INDEPENDENT_AMBULATORY_CARE_PROVIDER_SITE_OTHER): Payer: Medicare Other | Admitting: Internal Medicine

## 2016-09-01 ENCOUNTER — Encounter: Payer: Self-pay | Admitting: Internal Medicine

## 2016-09-01 VITALS — BP 90/58 | HR 86 | Temp 98.2°F | Ht 64.0 in | Wt 249.6 lb

## 2016-09-01 DIAGNOSIS — J9612 Chronic respiratory failure with hypercapnia: Secondary | ICD-10-CM

## 2016-09-01 DIAGNOSIS — J9611 Chronic respiratory failure with hypoxia: Secondary | ICD-10-CM

## 2016-09-01 DIAGNOSIS — J310 Chronic rhinitis: Secondary | ICD-10-CM

## 2016-09-01 DIAGNOSIS — J441 Chronic obstructive pulmonary disease with (acute) exacerbation: Secondary | ICD-10-CM | POA: Diagnosis not present

## 2016-09-01 DIAGNOSIS — R05 Cough: Secondary | ICD-10-CM

## 2016-09-01 DIAGNOSIS — R058 Other specified cough: Secondary | ICD-10-CM

## 2016-09-01 MED ORDER — GABAPENTIN 100 MG PO CAPS: 100.0000 mg | ORAL_CAPSULE | Freq: Three times a day (TID) | ORAL | 2 refills | Status: DC

## 2016-09-01 MED ORDER — PREDNISONE 10 MG PO TABS: ORAL_TABLET | ORAL | 0 refills | Status: DC

## 2016-09-01 NOTE — Telephone Encounter (Signed)
Okay with me 

## 2016-09-01 NOTE — Patient Instructions (Addendum)
Gabapentin 100 mg three times daily   Reduce valsartan to 160  Mg take one half daily   For cough > mucinex dm 1200 mg every 12 hours as needed and as much flutter as you can  Prednisone 10 mg take  4 each am x 2 days,   2 each am x 2 days,  1 each am x 2 days and stop   Please see patient coordinator before you leave today  to schedule sinus CT   Ok to adjust 02 to keep sats > 88%   Keep appt to see Allergy and Dr Craige CottaSood

## 2016-09-01 NOTE — Progress Notes (Signed)
Subjective:     Patient ID: Samuel DakinMichael Jeffords, male   DOB: 1951-05-25,  MRN: [id number]     HPI 65 yo quit smoking  2012   with GOLD 2 COPD    02/09/2016 Follow up : COPD Exacerbation/PNA  Pt returns for follow up for COPD exacerbation  Recent admission for COPD flare and PNA .  He was treated with abx. And diuresis  He is feeling better. Decreased cough and congestion .  CXR today shows stable basilar density c/w probable scarring.  Remains on Symbicort and Spiriva .  On O2 at 3l/m . Helios marathon works well.  Vaccines utd.  Denies chest pain, orthopnea, edema or hemoptysis.        Immunization History  Administered Date(s) Administered  . Influenza Split 10/25/2011  . Influenza Whole 09/10/2009, 09/04/2010, 09/12/2012  . Influenza,inj,Quad PF,36+ Mos 10/17/2013, 09/25/2014, 10/22/2015  . Pneumococcal Conjugate-13 01/28/2015  . Pneumococcal Polysaccharide-23 12/13/2006    Tests: PFT 12/24/09>>FEV1 1.60(57%), FEV1% 52, TLC 6.86(130%), DLCO 67%, no BD PSG with 3 liters oxygen 06/12/12>>AHI 1.2, SpO2 low 90%, PLMI 1. Echo 09/27/12>>EF 55 to 60%, mild RA dilation Echo 12/2015 EF 55-60%, Gr 1 DD       rec Continue on Symbicort and Spiriva .  Low salt diet  Continue on Oxygen 3l/m    03/11/2016 acute extended ov/Keron Koffman re:  Chief Complaint  Patient presents with  . Acute Visit    increased SOB x3 days with decreased mucus production (clear/milky), tightness, occasional wheezing.  takes 3x as long to recover from exertion.  denies any f/c/s, hemoptysis   was "better"   on 3lpm 24/7 and neb 3 x neb despite symbicort/spiriva>>  then worse x 3 days assoc with dysphagia despite taking carafate qid rec Plan A = Automatic = Symbicort 160 Take 2 puffs first thing in am and then another 2 puffs about 12 hours later.                                      Spiriva 2 pffs and Pantoprazole 40 mg Take 30-60 min before first meal of the day plus continue  zantac at  bedtime  Plan B = Backup Only use your albuterol (proair) as a rescue medication  Plan C = Crisis - only use your albuterol nebulizer if you first try Plan B and it fails to help > ok to use the nebulizer up to every 4 hours but if start needing it regularly call for immediate appointment Prednisone 10 mg take  4 each am x 2 days,   2 each am x 2 days,  1 each am x 2 days and stop  GERD  Diet     04/28/2016 acute ext ov/Regino Fournet re: GOLD II at baseline/aecopd maint rx symb 160 2bid/ spriiva respimat Chief Complaint  Patient presents with  . Acute Visit    Pt c/o increased SOB, cough with milky mucus, increased HR and decreased O2 with exertion. Pt denies CP/tightness, f/n/v. Pt has been using flutter valve daily.   back to baseline and not needing much saba at all until sob/cough worse one week prior to OV  - has futter not using  rec Plan A = Automatic = Symbicort 160 Take 2 puffs first thing in am and then another 2 puffs about 12 hours later.   Spiriva 2 pffs and Pantoprazole 40 mg Take 30-60 min before first meal of  the day plus continue  zantac at bedtime  Plan B = Backup Only use your albuterol (proair)  Plan C = Crisis - only use your albuterol nebulizer if you first try Plan B and it fails to help > ok to use the nebulizer up to every 4 hours but if start needing it regularly call for immediate appointment For cough > mucinex dm 1200 mg every 12 hours and supplement with norco if still coughing and use the flutter as much as you can zpak Prednisone 10 mg take  4 each am x 2 days,   2 each am x 2 days,  1 each am x 2 days and stop  GERD diet    08/11/2016  Acute ov/Mckell Riecke re:  AECOPD/ GOLD II symb/spiriva/singulair  Chief Complaint  Patient presents with  . Acute Visit    Pt c/o increased cough and SOB for the past 2 days. He is coughing up large amounts of clear/milky sputum.    at baseline maybe one saba hfa / 02 3lpm 24/7 Already on norco 4 x daily at baseline Abrupt onset x 2  days worse sob with  Cough> milky sputum only, nothing really purulent  Lots of lifesavers but mint  Comfortable at rest p saba   rec Plan A = Automatic = Continue Symbicort 160 Take 2 puffs first thing in am and then another 2 puffs about 12 hours later. Spiriva 2 pffs and Pantoprazole 40 mg Take 30-60 min before first meal of the day plus continue  zantac at bedtime  Plan B = Backup Only use your albuterol (proair) as a rescue medication Plan C = Crisis - only use your albuterol nebulizer if you first try Plan B and it fails to help > ok to use the nebulizer up to every 4 hours but if start needing it regularly call for immediate appointment For cough > mucinex dm 1200 mg every 12 hours and supplement with norco if still coughing and use the flutter as much as you can zpak Prednisone 10 mg take  4 each am x 2 days,   2 each am x 2 days,  1 each am x 2 days and stop  GERD diet  Please remember to go to the  x-ray department downstairs for your tests - we will call you with the results when they are available.  if can't get comfortable at rest after the neb will need to go to ER    09/01/2016  Acute extended ov/Leahann Lempke re: onset of flare cough /sob  early to [facility] august 2017  Chief Complaint  Patient presents with  . Acute Visit    Pt c/o increase in SOB x 4 days. C/o prod cough with clear to white mucus and chest tightness. Pt states his O2 has been in the 80's. Pt denies f/c/s.   did improve transiently p last ov but never back to baseline then worse x 4 days with severe hacking cough/ min productive all white mucus and increase saba over baseline. Not using flutter valve or mucinex dm as instructed bu taking norc qid for back pain     No obvious patterns day to day or daytime variability or assoc  Mucus plug/cp or chest tightness,   or overt sinus or hb symptoms. No unusual exp hx or h/o childhood pna/ asthma or knowledge of premature birth.  Sleeping ok without nocturnal  or early am  exacerbation  of respiratory  c/o's or need for noct saba. Also denies  any obvious fluctuation of symptoms with weather or environmental changes or other aggravating or alleviating factors except as outlined above   Current Medications, Allergies, Complete Past Medical History, Past Surgical History, Family History, and Social History were reviewed in [facility] electronic medical record.  ROS  The following are not active complaints unless bolded sore throat, dysphagia, dental problems, itching, sneezing,  nasal congestion or excess/ purulent secretions, ear ache,   fever, chills, sweats, unintended wt loss, classically pleuritic or exertional cp, hemoptysis,  orthopnea pnd or leg swelling, presyncope, palpitations, abdominal pain, anorexia, nausea, vomiting, diarrhea  or change in bowel or bladder habits, change in stools or urine, dysuria,hematuria,  rash, arthralgias, visual complaints, headache, numbness, weakness or ataxia or problems with walking or coordination,  change in mood/affect or memory.                  Objective:   Physical Exam   obese wm quite anxious with freq vigorous throat clearing   09/02/2016  08/11/2016      248  04/28/2016      237   03/11/16 234 lb 6.4 oz (106.323 kg)  03/01/16 237 lb (107.502 kg)  02/09/16 237 lb (107.502 kg)    Vital signs reviewed - sats 90% on 6lpm on arrival   HEENT: nl dentition, turbinates, and oropharynx. Nl external ear canals without cough reflex   NECK :  without JVD/Nodes/TM/ nl carotid upstrokes bilaterally   LUNGS: no acc muscle use,  Coarse insp and exp rhonchi bilaterally   CV:  RRR  no s3 or murmur or increase in P2, no edema   ABD:  soft and nontender with nl inspiratory excursion in the supine position. No bruits or organomegaly, bowel sounds nl  MS:  Nl gait/ ext warm without deformities, calf tenderness, cyanosis or clubbing No obvious joint restrictions   SKIN: warm and dry without lesions    NEURO:   alert, approp, nl sensorium with  no motor deficits        CXR PA and Lateral:   08/11/2016 :    I personally reviewed images and agree with radiology impression as follows:    Low lung volumes with mild bibasilar atelectasis and/or scarring . Tiny calcified pulmonary nodules noted consistent with granulomas.

## 2016-09-01 NOTE — Telephone Encounter (Signed)
Pt requesting to switch from Dr Craige CottaSood to Dr Sherene SiresWert.   Please advise Dr Craige CottaSood. Thanks.

## 2016-09-02 ENCOUNTER — Encounter: Payer: Self-pay | Admitting: Internal Medicine

## 2016-09-02 DIAGNOSIS — R05 Cough: Secondary | ICD-10-CM | POA: Insufficient documentation

## 2016-09-02 DIAGNOSIS — R058 Other specified cough: Secondary | ICD-10-CM | POA: Insufficient documentation

## 2016-09-02 NOTE — Telephone Encounter (Signed)
Called spoke with pt. He is scheduled to see TP 10/2 at 3:15 for med calendar. Pt aware to bring ALL medications. Nothing further needed

## 2016-09-02 NOTE — Assessment & Plan Note (Signed)
Trial of neurontin 100 tid 09/01/2016 >>>  Already using flutter valve though doesn't understand when to do so and needs to cough into it to prevent cyclical cough from airway trauma.  Will also proceed with trial of low dose neurontin and sinus CT

## 2016-09-02 NOTE — Assessment & Plan Note (Signed)
HCO3  06/29/16  = 32  C/w hypercarbia   Goal for sats should be set at 88% or higher / reviewed effects of hypercarbia on target sats

## 2016-09-02 NOTE — Assessment & Plan Note (Signed)
Allergy profile 03/11/16  >  Eos 0.2 /  IgE  359 Pos RAST  trees/ grasses > referred to allergy - Sinus CT 09/02/2016 >>>

## 2016-09-02 NOTE — Telephone Encounter (Signed)
MW please advise if you are ok to take this pt--he wanted to switch from [place] to [place].  thanks

## 2016-09-02 NOTE — Telephone Encounter (Signed)
I am ok with that but will need to see Tammy first for med calendar

## 2016-09-02 NOTE — Assessment & Plan Note (Signed)
Body mass index is 42.84  Lab Results  Component Value Date   TSH 2.34 06/29/2016     Contributing to gerd tendency/ doe/reviewed the need and the process to achieve and maintain neg calorie balance > defer f/u primary care including intermittently monitoring thyroid status

## 2016-09-02 NOTE — Assessment & Plan Note (Addendum)
GOLD II at baseline spirometry 2011  - quit smoking 2012  08/11/2016   After extensive coaching HFA effectiveness =    90%  - Rx Zpak/ pred 08/11/2016 > transiently improved but flared again 08/29/16 > 09/01/2016 rx another pred x 6 days   Still  Group D in terms of symptom/risk and laba/lama/ICS  therefore appropriate rx at this point.  - The proper method of use, as well as anticipated side effects, of a metered-dose inhaler are discussed and demonstrated to the patient. Improved effectiveness after extensive coaching during this visit to a level of approximately 75 % from a baseline of 50 %  So continue symb 160 2bid/ spiriva/singulair and approp use of sabas/ reviewed  - allergy eval pending / sinus ct ordered   I had an extended discussion with the patient reviewing all relevant studies completed to date and  lasting 15 to 20 minutes of a 25 minute visit    Each maintenance medication was reviewed in detail including most importantly the difference between maintenance and prns and under what circumstances the prns are to be triggered using an action plan format that is not reflected in the computer generated alphabetically organized AVS.    Please see instructions for details which were reviewed in writing and the patient given a copy highlighting the part that I personally wrote and discussed at today's ov.

## 2016-09-03 ENCOUNTER — Ambulatory Visit (INDEPENDENT_AMBULATORY_CARE_PROVIDER_SITE_OTHER)
Admission: RE | Admit: 2016-09-03 | Discharge: 2016-09-03 | Disposition: A | Discharge status: Home / Self Care | Payer: Medicare Other | Source: Ambulatory Visit | Attending: Internal Medicine | Admitting: Internal Medicine

## 2016-09-03 DIAGNOSIS — J310 Chronic rhinitis: Secondary | ICD-10-CM

## 2016-09-06 ENCOUNTER — Telehealth: Payer: Self-pay | Admitting: Internal Medicine

## 2016-09-06 MED ORDER — PREDNISONE 10 MG PO TABS: ORAL_TABLET | ORAL | 0 refills | Status: DC

## 2016-09-06 NOTE — Telephone Encounter (Signed)
Last ov on 09/01/16 with MW Instructions  Patient Instructions   Gabapentin 100 mg three times daily    Reduce valsartan to 160  Mg take one half daily    For cough > mucinex dm 1200 mg every 12 hours as needed and as much flutter as you can   Prednisone 10 mg take  4 each am x 2 days,   2 each am x 2 days,  1 each am x 2 days and stop    Please see patient coordinator before you leave today  to schedule sinus CT    Ok to adjust 02 to keep sats > 88%    Keep appt to see Allergy and Dr Benedict NeedySood      Called spoke with pt. Pt states that since starting the prednisone on 09/01/16 his breathing has improved and he has no complaints at this time. He states that he will finish the pred taper tomorrow and [place] [facility] [place] and will not return until 09/12/16 or 10/2/017. He is requesting a pred taper to take with him incase the symptoms return. I explained to him that I would have to send the message to MW for his recs. He voiced understanding and had no further questions.   MW please advise

## 2016-09-06 NOTE — Telephone Encounter (Signed)
Called spoke with pt. Reviewed MW's recs. Pt voiced understanding and had no further questions. Rx sent to [facility] on [address] per pt request.

## 2016-09-06 NOTE — Progress Notes (Signed)
Spoke with pt and notified of results per Dr. Wert. Pt verbalized understanding and denied any questions. 

## 2016-09-06 NOTE — Telephone Encounter (Signed)
Ok Prednisone 10 mg take  4 each am x 2 days,   2 each am x 2 days,  1 each am x 2 days and stop  

## 2016-09-08 ENCOUNTER — Ambulatory Visit: Payer: Medicare Other | Admitting: Adult Health

## 2016-09-13 ENCOUNTER — Encounter: Payer: Medicare Other | Admitting: Adult Health

## 2016-09-14 ENCOUNTER — Telehealth: Payer: Self-pay | Admitting: Internal Medicine

## 2016-09-14 MED ORDER — GABAPENTIN 100 MG PO CAPS: 100.0000 mg | ORAL_CAPSULE | Freq: Three times a day (TID) | ORAL | 0 refills | Status: DC

## 2016-09-20 ENCOUNTER — Encounter: Payer: Self-pay | Admitting: Adult Health

## 2016-09-20 ENCOUNTER — Ambulatory Visit (INDEPENDENT_AMBULATORY_CARE_PROVIDER_SITE_OTHER): Payer: Medicare Other | Admitting: Adult Health

## 2016-09-20 DIAGNOSIS — J9612 Chronic respiratory failure with hypercapnia: Secondary | ICD-10-CM | POA: Diagnosis not present

## 2016-09-20 DIAGNOSIS — J449 Chronic obstructive pulmonary disease, unspecified: Secondary | ICD-10-CM

## 2016-09-20 DIAGNOSIS — J9611 Chronic respiratory failure with hypoxia: Secondary | ICD-10-CM | POA: Diagnosis not present

## 2016-09-20 NOTE — Patient Instructions (Addendum)
Follow med calendar closely and bring to each visit.  Continue on current regimen  Follow up with Dr. Sherene SiresWert  In 6 weeks and As needed   Please contact office for sooner follow up if symptoms do not improve or worsen or seek emergency care

## 2016-09-20 NOTE — Assessment & Plan Note (Signed)
Cont on O2 .  

## 2016-09-20 NOTE — Progress Notes (Signed)
Subjective:     Patient ID: [name], male   DOB: 1951-05-25,  MRN: [id number]     HPI 65 yo quit smoking  2012   with GOLD 2 COPD    02/09/2016 Follow up : COPD Exacerbation/PNA  Pt returns for follow up for COPD exacerbation  Recent admission for COPD flare and PNA .  He was treated with abx. And diuresis  He is feeling better. Decreased cough and congestion .  CXR today shows stable basilar density c/w probable scarring.  Remains on Symbicort and Spiriva .  On O2 at 3l/m . Helios marathon works well.  Vaccines utd.  Denies chest pain, orthopnea, edema or hemoptysis.        Immunization History  Administered Date(s) Administered  . Influenza Split 10/25/2011  . Influenza Whole 09/10/2009, 09/04/2010, 09/12/2012  . Influenza,inj,Quad PF,36+ Mos 10/17/2013, 09/25/2014, 10/22/2015  . Pneumococcal Conjugate-13 01/28/2015  . Pneumococcal Polysaccharide-23 12/13/2006    Tests: PFT 12/24/09>>FEV1 1.60(57%), FEV1% 52, TLC 6.86(130%), DLCO 67%, no BD PSG with 3 liters oxygen 06/12/12>>AHI 1.2, SpO2 low 90%, PLMI 1. Echo 09/27/12>>EF 55 to 60%, mild RA dilation Echo 12/2015 EF 55-60%, Gr 1 DD       rec Continue on Symbicort and Spiriva .  Low salt diet  Continue on Oxygen 3l/m    03/11/2016 acute extended ov/Wert re:  Chief Complaint  Patient presents with  . Acute Visit    increased SOB x3 days with decreased mucus production (clear/milky), tightness, occasional wheezing.  takes 3x as long to recover from exertion.  denies any f/c/s, hemoptysis   was "better"   on 3lpm 24/7 and neb 3 x neb despite symbicort/spiriva>>  then worse x 3 days assoc with dysphagia despite taking carafate qid rec Plan A = Automatic = Symbicort 160 Take 2 puffs first thing in am and then another 2 puffs about 12 hours later.                                      Spiriva 2 pffs and Pantoprazole 40 mg Take 30-60 min before first meal of the day plus continue  zantac at  bedtime  Plan B = Backup Only use your albuterol (proair) as a rescue medication  Plan C = Crisis - only use your albuterol nebulizer if you first try Plan B and it fails to help > ok to use the nebulizer up to every 4 hours but if start needing it regularly call for immediate appointment Prednisone 10 mg take  4 each am x 2 days,   2 each am x 2 days,  1 each am x 2 days and stop  GERD  Diet     04/28/2016 acute ext ov/Wert re: GOLD II at baseline/aecopd maint rx symb 160 2bid/ spriiva respimat Chief Complaint  Patient presents with  . Acute Visit    Pt c/o increased SOB, cough with milky mucus, increased HR and decreased O2 with exertion. Pt denies CP/tightness, f/n/v. Pt has been using flutter valve daily.   back to baseline and not needing much saba at all until sob/cough worse one week prior to OV  - has futter not using  rec Plan A = Automatic = Symbicort 160 Take 2 puffs first thing in am and then another 2 puffs about 12 hours later.   Spiriva 2 pffs and Pantoprazole 40 mg Take 30-60 min before first meal of  the day plus continue  zantac at bedtime  Plan B = Backup Only use your albuterol (proair)  Plan C = Crisis - only use your albuterol nebulizer if you first try Plan B and it fails to help > ok to use the nebulizer up to every 4 hours but if start needing it regularly call for immediate appointment For cough > mucinex dm 1200 mg every 12 hours and supplement with norco if still coughing and use the flutter as much as you can zpak Prednisone 10 mg take  4 each am x 2 days,   2 each am x 2 days,  1 each am x 2 days and stop  GERD diet    08/11/2016  Acute ov/Wert re:  AECOPD/ GOLD II symb/spiriva/singulair  Chief Complaint  Patient presents with  . Acute Visit    Pt c/o increased cough and SOB for the past 2 days. He is coughing up large amounts of clear/milky sputum.    at baseline maybe one saba hfa / 02 3lpm 24/7 Already on norco 4 x daily at baseline Abrupt onset x 2  days worse sob with  Cough> milky sputum only, nothing really purulent  Lots of lifesavers but mint  Comfortable at rest p saba   rec Plan A = Automatic = Continue Symbicort 160 Take 2 puffs first thing in am and then another 2 puffs about 12 hours later. Spiriva 2 pffs and Pantoprazole 40 mg Take 30-60 min before first meal of the day plus continue  zantac at bedtime  Plan B = Backup Only use your albuterol (proair) as a rescue medication Plan C = Crisis - only use your albuterol nebulizer if you first try Plan B and it fails to help > ok to use the nebulizer up to every 4 hours but if start needing it regularly call for immediate appointment For cough > mucinex dm 1200 mg every 12 hours and supplement with norco if still coughing and use the flutter as much as you can zpak Prednisone 10 mg take  4 each am x 2 days,   2 each am x 2 days,  1 each am x 2 days and stop  GERD diet  Please remember to go to the  x-ray department downstairs for your tests - we will call you with the results when they are available.  if can't get comfortable at rest after the neb will need to go to ER    09/01/2016  Acute extended ov/Wert re: onset of flare cough /sob  early to [facility] august 2017  Chief Complaint  Patient presents with  . Acute Visit    Pt c/o increase in SOB x 4 days. C/o prod cough with clear to white mucus and chest tightness. Pt states his O2 has been in the 80's. Pt denies f/c/s.   did improve transiently p last ov but never back to baseline then worse x 4 days with severe hacking cough/ min productive all white mucus and increase saba over baseline. Not using flutter valve or mucinex dm as instructed bu taking norc qid for back pain    >>rx Gabapentin for cough , pred taper , CT sinus >neg   09/20/2016 Follow up : GOLD II COPD /O2 3 L, AR w/ positive Rast /High IgE. ., UACS   Patient returns for a two-week follow-up for recent COPD flare. He was set up for a CT sinus that showed no evidence of  acute sinusitis. He was given a  prednisone taper and started Gabapentin 100mg  Three times a day   He is starting to feel some better. Cough is decreased some.  We reviewed all his med and organized them into a med calendar with pt education .  Talked about cost of inhaler . We discussed starting patient assistance papers.  He denies chest pain, orthopnea or increased edema.      Current Medications, Allergies, Complete Past Medical History, Past Surgical History, Family History, and Social History were reviewed in [facility] electronic medical record.  ROS  The following are not active complaints unless bolded sore throat, dysphagia, dental problems, itching, sneezing,  nasal congestion or excess/ purulent secretions, ear ache,   fever, chills, sweats, unintended wt loss, classically pleuritic or exertional cp, hemoptysis,  orthopnea pnd or leg swelling, presyncope, palpitations, abdominal pain, anorexia, nausea, vomiting, diarrhea  or change in bowel or bladder habits, change in stools or urine, dysuria,hematuria,  rash, arthralgias, visual complaints, headache, numbness, weakness or ataxia or problems with walking or coordination,  change in mood/affect or memory.                  Objective:   Physical Exam   obese wm  Vitals:   09/20/16 1614  BP: 140/68  Pulse: (!) 102  Temp: 98.1 F (36.7 C)  TempSrc: Oral  SpO2: 92%  Weight: 248 lb (112.5 kg)  Height: 5\' 4"  (1.626 m)      Vital signs reviewed -  HEENT: nl dentition, turbinates, and oropharynx. Nl external ear canals without cough reflex   NECK :  without JVD/Nodes/TM/ nl carotid upstrokes bilaterally   LUNGS: no acc muscle use,  Decreased bs in bases   CV:  RRR  no s3 or murmur or increase in P2, no edema   ABD:  soft and nontender with nl inspiratory excursion in the supine position. No bruits or organomegaly, bowel sounds nl  MS:  Nl gait/ ext warm without deformities, calf tenderness, cyanosis or  clubbing No obvious joint restrictions   SKIN: warm and dry without lesions    NEURO:  alert, approp, nl sensorium with  no motor deficits        CXR PA and Lateral:   08/11/2016 :      Low lung volumes with mild bibasilar atelectasis and/or scarring . Tiny calcified pulmonary nodules noted consistent with granulomas.

## 2016-09-20 NOTE — Assessment & Plan Note (Signed)
Recent flare with UAC now improving  Patient's medications were reviewed today and patient education was given. Computerized medication calendar was adjusted/completed   Plan  Patient Instructions  Follow med calendar closely and bring to each visit.  Continue on current regimen  Follow up with Dr. Sherene SiresWert  In 6 weeks and As needed   Please contact office for sooner follow up if symptoms do not improve or worsen or seek emergency care

## 2016-09-21 ENCOUNTER — Ambulatory Visit (INDEPENDENT_AMBULATORY_CARE_PROVIDER_SITE_OTHER): Payer: Medicare Other | Admitting: Allergy and Immunology

## 2016-09-21 ENCOUNTER — Encounter: Payer: Self-pay | Admitting: Allergy and Immunology

## 2016-09-21 ENCOUNTER — Encounter (INDEPENDENT_AMBULATORY_CARE_PROVIDER_SITE_OTHER): Payer: Self-pay

## 2016-09-21 VITALS — BP 122/62 | HR 76 | Temp 98.4°F | Resp 24 | Ht 62.95 in | Wt 251.2 lb

## 2016-09-21 DIAGNOSIS — J3089 Other allergic rhinitis: Secondary | ICD-10-CM | POA: Diagnosis not present

## 2016-09-21 DIAGNOSIS — J449 Chronic obstructive pulmonary disease, unspecified: Secondary | ICD-10-CM

## 2016-09-21 NOTE — Progress Notes (Signed)
Chart and office note reviewed in detail  > agree with a/p as outlined    
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
## 2016-09-21 NOTE — Patient Instructions (Addendum)
  1. Allergen avoidance measures  2. Do not allow anyone but Dr. Sherene SiresWert direct your respiratory medication use  3. Consider low dose daily steroid administration. Discuss with Dr. Sherene SiresWert  4. Consider immunotherapy  5. Obtain Flu vaccine, Prevnar + Pneumovax (if not already administered)

## 2016-09-24 NOTE — Addendum Note (Signed)
Addended by: Karalee HeightOX, Jacqualine Weichel P on: 09/24/2016 10:07 AM   Modules accepted: Orders

## 2016-10-01 ENCOUNTER — Telehealth: Payer: Self-pay | Admitting: Internal Medicine

## 2016-10-01 MED ORDER — PREDNISONE 10 MG PO TABS: ORAL_TABLET | ORAL | 0 refills | Status: DC

## 2016-10-01 NOTE — Telephone Encounter (Signed)
Please have him start pred Take 40mg  daily for 3 days, then 30mg  daily for 3 days, then 20mg  daily for 3 days, then 10mg  daily for 3 days, then stop Have him call us next week to report on sx. If worsening over the weekend he needs to be evaluated in ED with a CXR

## 2016-10-01 NOTE — Telephone Encounter (Signed)
Spoke with the pt and notified of recs per RB  He verbalized understanding and nothing further needed  Rx was sent to pharm

## 2016-10-01 NOTE — Telephone Encounter (Signed)
Spoke with the pt  He is c/o increased SOB and cough with yellow sputum since this am  He is not having any f/c/s, wheezing, chest tightness or other co's  RB, please advise, thanks!

## 2016-10-05 ENCOUNTER — Ambulatory Visit: Payer: Medicare Other | Admitting: Pulmonary Disease

## 2016-10-13 ENCOUNTER — Encounter: Payer: Self-pay | Admitting: Allergy and Immunology

## 2016-10-13 ENCOUNTER — Ambulatory Visit (INDEPENDENT_AMBULATORY_CARE_PROVIDER_SITE_OTHER): Payer: Medicare Other | Admitting: Allergy and Immunology

## 2016-10-13 ENCOUNTER — Encounter (INDEPENDENT_AMBULATORY_CARE_PROVIDER_SITE_OTHER): Payer: Self-pay

## 2016-10-13 VITALS — BP 124/70 | HR 92 | Resp 20

## 2016-10-13 DIAGNOSIS — J3089 Other allergic rhinitis: Secondary | ICD-10-CM | POA: Diagnosis not present

## 2016-10-13 NOTE — Progress Notes (Signed)
[name] returns to this clinic to have skin testing performed. He demonstrated hypersensitivity to mold and house dust mite. He will perform allergen avoidance measures directed against these aeroallergens and continue his previously unassigned medical plan. He is a candidate for immunotherapy and I given him literature on this form of treatment during today's evaluation.

## 2016-10-15 ENCOUNTER — Ambulatory Visit (INDEPENDENT_AMBULATORY_CARE_PROVIDER_SITE_OTHER): Payer: Medicare Other | Admitting: Internal Medicine

## 2016-10-15 ENCOUNTER — Encounter: Payer: Self-pay | Admitting: Internal Medicine

## 2016-10-15 VITALS — BP 120/62 | HR 108 | Ht 64.0 in | Wt 248.2 lb

## 2016-10-15 DIAGNOSIS — R0602 Shortness of breath: Secondary | ICD-10-CM | POA: Diagnosis not present

## 2016-10-15 DIAGNOSIS — J9612 Chronic respiratory failure with hypercapnia: Secondary | ICD-10-CM

## 2016-10-15 DIAGNOSIS — Z23 Encounter for immunization: Secondary | ICD-10-CM

## 2016-10-15 DIAGNOSIS — J449 Chronic obstructive pulmonary disease, unspecified: Secondary | ICD-10-CM | POA: Diagnosis not present

## 2016-10-15 DIAGNOSIS — R058 Other specified cough: Secondary | ICD-10-CM

## 2016-10-15 DIAGNOSIS — J9611 Chronic respiratory failure with hypoxia: Secondary | ICD-10-CM

## 2016-10-15 DIAGNOSIS — J441 Chronic obstructive pulmonary disease with (acute) exacerbation: Secondary | ICD-10-CM

## 2016-10-15 DIAGNOSIS — R05 Cough: Secondary | ICD-10-CM

## 2016-10-15 MED ORDER — PREDNISONE 10 MG PO TABS: ORAL_TABLET | ORAL | 2 refills | Status: DC

## 2016-10-15 NOTE — Patient Instructions (Addendum)
If breathing or coughing worse > prednisone 10 mg x 2 daily until better then 1 x 2 days then 1/2 x days and stop  For cough > mucinex dm up to 1200 mg every 12 hours and use the flutter as much as possible    See calendar for specific medication instructions and bring it back for each and every office visit for every healthcare provider you see.  Without it,  you may not receive the best quality medical care that we feel you deserve.  You will note that the calendar groups together  your maintenance  medications that are timed at particular times of the day.  Think of this as your checklist for what your doctor has instructed you to do until your next evaluation to see what benefit  there is  to staying on a consistent group of medications intended to keep you well.  The other group at the bottom is entirely up to you to use as you see fit  for specific symptoms that may arise between visits that require you to treat them on an as needed basis.  Think of this as your action plan or "what if" list.   Separating the top medications from the bottom group is fundamental to providing you adequate care going forward.    Please schedule a follow up office visit in 4 weeks, sooner if needed  - add consider refer to rehab

## 2016-10-15 NOTE — Progress Notes (Signed)
Subjective:     Patient ID: [name], male   DOB: 1951/07/10,  MRN: [id number]     HPI 65 yo quit smoking  2012   with GOLD 2 COPD    02/09/2016 Follow up : COPD Exacerbation/PNA  Pt returns for follow up for COPD exacerbation  Recent admission for COPD flare and PNA .  He was treated with abx. And diuresis  He is feeling better. Decreased cough and congestion .  CXR today shows stable basilar density c/w probable scarring.  Remains on Symbicort and Spiriva .  On O2 at 3l/m . [place] marathon works well.  Vaccines utd.  Denies chest pain, orthopnea, edema or hemoptysis.        Immunization History  Administered Date(s) Administered  . Influenza Split 10/25/2011  . Influenza Whole 09/10/2009, 09/04/2010, 09/12/2012  . Influenza,inj,Quad PF,36+ Mos 10/17/2013, 09/25/2014, 10/22/2015  . Pneumococcal Conjugate-13 01/28/2015  . Pneumococcal Polysaccharide-23 12/13/2006    Tests: PFT 12/24/09>>FEV1 1.60(57%), FEV1% 52, TLC 6.86(130%), DLCO 67%, no BD PSG with 3 liters oxygen 06/12/12>>AHI 1.2, SpO2 low 90%, PLMI 1. Echo 09/27/12>>EF 55 to 60%, mild RA dilation Echo 12/2015 EF 55-60%, Gr 1 DD       rec Continue on Symbicort and Spiriva .  Low salt diet  Continue on Oxygen 3l/m    03/11/2016 acute extended ov/Shi Blankenship re:  Chief Complaint  Patient presents with  . Acute Visit    increased SOB x3 days with decreased mucus production (clear/milky), tightness, occasional wheezing.  takes 3x as long to recover from exertion.  denies any f/c/s, hemoptysis   was "better"   on 3lpm 24/7 and neb 3 x neb despite symbicort/spiriva>>  then worse x 3 days assoc with dysphagia despite taking carafate qid rec Plan A = Automatic = Symbicort 160 Take 2 puffs first thing in am and then another 2 puffs about 12 hours later.                                      Spiriva 2 pffs and Pantoprazole 40 mg Take 30-60 min before first meal of the day plus continue  zantac at  bedtime  Plan B = Backup Only use your albuterol (proair) as a rescue medication  Plan C = Crisis - only use your albuterol nebulizer if you first try Plan B and it fails to help > ok to use the nebulizer up to every 4 hours but if start needing it regularly call for immediate appointment Prednisone 10 mg take  4 each am x 2 days,   2 each am x 2 days,  1 each am x 2 days and stop  GERD  Diet     04/28/2016 acute ext ov/Jenel Gierke re: GOLD II at baseline/aecopd maint rx symb 160 2bid/ spriiva respimat Chief Complaint  Patient presents with  . Acute Visit    Pt c/o increased SOB, cough with milky mucus, increased HR and decreased O2 with exertion. Pt denies CP/tightness, f/n/v. Pt has been using flutter valve daily.   back to baseline and not needing much saba at all until sob/cough worse one week prior to OV  - has futter not using  rec Plan A = Automatic = Symbicort 160 Take 2 puffs first thing in am and then another 2 puffs about 12 hours later.   Spiriva 2 pffs and Pantoprazole 40 mg Take 30-60 min before first meal of  the day plus continue  zantac at bedtime  Plan B = Backup Only use your albuterol (proair)  Plan C = Crisis - only use your albuterol nebulizer if you first try Plan B and it fails to help > ok to use the nebulizer up to every 4 hours but if start needing it regularly call for immediate appointment For cough > mucinex dm 1200 mg every 12 hours and supplement with norco if still coughing and use the flutter as much as you can zpak Prednisone 10 mg take  4 each am x 2 days,   2 each am x 2 days,  1 each am x 2 days and stop  GERD diet    08/11/2016  Acute ov/Srihari Shellhammer re:  AECOPD/ GOLD II symb/spiriva/singulair  Chief Complaint  Patient presents with  . Acute Visit    Pt c/o increased cough and SOB for the past 2 days. He is coughing up large amounts of clear/milky sputum.    at baseline maybe one saba hfa / 02 3lpm 24/7 Already on norco 4 x daily at baseline Abrupt onset x 2  days worse sob with  Cough> milky sputum only, nothing really purulent  Lots of lifesavers but mint  Comfortable at rest p saba   rec Plan A = Automatic = Continue Symbicort 160 Take 2 puffs first thing in am and then another 2 puffs about 12 hours later. Spiriva 2 pffs and Pantoprazole 40 mg Take 30-60 min before first meal of the day plus continue  zantac at bedtime  Plan B = Backup Only use your albuterol (proair) as a rescue medication Plan C = Crisis - only use your albuterol nebulizer if you first try Plan B and it fails to help > ok to use the nebulizer up to every 4 hours but if start needing it regularly call for immediate appointment For cough > mucinex dm 1200 mg every 12 hours and supplement with norco if still coughing and use the flutter as much as you can zpak Prednisone 10 mg take  4 each am x 2 days,   2 each am x 2 days,  1 each am x 2 days and stop  GERD diet  Please remember to go to the  x-ray department downstairs for your tests - we will call you with the results when they are available.  if can't get comfortable at rest after the neb will need to go to ER    09/01/2016  Acute extended ov/Burnis Kaser re: onset of flare cough [facility]so[facility]  [facility]  Chief Complaint  Patient presents with  . Acute Visit    Pt c/o increase in SOB x 4 days. C/o prod cough with clear to white mucus and chest tightness. Pt states his O2 has been in the 80's. Pt denies f/c/s.   did improve transiently p last ov but never back to baseline then worse x 4 days with severe hacking cough/ min productive all white mucus and increase saba over baseline. Not using flutter valve or mucinex dm as instructed bu taking norc qid for back pain    >>rx Gabapentin for cough , pred taper , CT sinus >neg     09/20/2016 NP Follow up : GOLD II COPD /O2 3 L, AR w/ positive Rast /High IgE. ., UACS   Patient returns for a two-week follow-up for recent COPD flare. He was set up for a CT sinus that showed no  evidence of acute sinusitis. He  was given a prednisone taper and started Gabapentin 100mg  Three times a day   He is starting to feel some better. Cough is decreased some.  We reviewed all his med and organized them into a med calendar with pt education .  Talked about cost of inhaler . We discussed starting patient assistance papers.  rec Follow med calendar closely and bring to each visit.  Continue on current regimen   09/21/16 Kozlow eval > not tested as on antihistamines> consider low dose maint pred   10/15/2016  f/u ov/Tor Tsuda re: COPD II/ MO/ 02 dep on symb/spiriva/confused with action plan for sabas despite written action plan in hand/ still occ smoking  Chief Complaint  Patient presents with  . Follow-up    Breathing is "not too bad" today. He is using albuterol inhaler and neb at least once per day.      pt does report much better with prednisone in sob/rhinitis/cough for up to a week  Doe contiues at [facility] = can't walk 100 yards even at a slow pace at a flat grade s stopping due to sob  Even on 3lpm    No obvious day to day or daytime variability or assoc excess/ purulent sputum or mucus plugs or hemoptysis or cp or chest tightness, subjective wheeze or overt sinus or hb symptoms. No unusual exp hx or h/o childhood pna/ asthma or knowledge of premature birth.  Sleeping ok without nocturnal  or early am exacerbation  of respiratory  c/o's or need for noct saba. Also denies any obvious fluctuation of symptoms with weather or environmental changes or other aggravating or alleviating factors except as outlined above   Current Medications, Allergies, Complete Past Medical History, Past Surgical History, Family History, and Social History were reviewed in [employer] record.  ROS  The following are not active complaints unless bolded sore throat, dysphagia, dental problems, itching, sneezing,  nasal congestion or excess/ purulent secretions, ear ache,   fever,  chills, sweats, unintended wt loss, classically pleuritic or exertional cp,  orthopnea pnd or leg swelling, presyncope, palpitations, abdominal pain, anorexia, nausea, vomiting, diarrhea  or change in bowel or bladder habits, change in stools or urine, dysuria,hematuria,  rash, arthralgias, visual complaints, headache, numbness, weakness or ataxia or problems with walking or coordination,  change in mood/affect or memory.                Objective:   Physical Exam   obese wm      Wt Readings from Last 3 Encounters:  10/15/16 248 lb 3.2 oz (112.6 kg)  09/21/16 251 lb 3.2 oz (113.9 kg)  09/20/16 248 lb (112.5 kg)    Vital signs reviewed - Note on arrival 02 sats  91% on 3lpm     HEENT: nl dentition, turbinates, and oropharynx. Nl external ear canals without cough reflex   NECK :  without JVD/Nodes/TM/ nl carotid upstrokes bilaterally   LUNGS: no acc muscle use,  Decreased bs in bases   CV:  RRR  no s3 or murmur or increase in P2, no edema   ABD:  Very obese/ large pannus/ soft and nontender with nl inspiratory excursion in the supine position. No bruits or organomegaly, bowel sounds nl  MS:  Nl gait/ ext warm without deformities, calf tenderness, cyanosis or clubbing No obvious joint restrictions   SKIN: warm and dry without lesions    NEURO:  alert, approp, nl sensorium with  no motor deficits

## 2016-10-16 ENCOUNTER — Encounter: Payer: Self-pay | Admitting: Internal Medicine

## 2016-10-16 DIAGNOSIS — J449 Chronic obstructive pulmonary disease, unspecified: Secondary | ICD-10-CM | POA: Insufficient documentation

## 2016-10-16 NOTE — Assessment & Plan Note (Signed)
Clearly multifactorial with obesity/deconditioning also a factor, will consider rehab at next ov

## 2016-10-16 NOTE — Assessment & Plan Note (Addendum)
Spirometry 10/15/2016  FEV1 1.41 (52%)  Ratio 61 p am symb/spiriva/saba w/in 4h - added prednisone 20 max and taper off for flares as part of action plan 10/15/2016 >>>   Actually fairly well compensated presently albeit it with recent pred taper.  I had an extended discussion with the patient reviewing all relevant studies completed to date and  lasting   to 20  minutes of a 40 minute visit    He wishes to transition from Dr Craige CottaSood to my care but needs to understand the limitations of what I offer and the attention to detail he'll need to maintain to meet me half way in terms of medication reconciliation  Dr Kathyrn LassKozlow's notes/ rec reviewed with pt in detail  Each maintenance medication was reviewed in detail including most importantly the difference between maintenance and prns and under what circumstances the prns are to be triggered using an action plan format that is not reflected in the computer generated alphabetically organized AVS but trather by a customized med calendar that reflects the AVS meds with confirmed 100% correlation.   Please see instructions for details which were reviewed in writing and the patient given a copy highlighting the part that I personally wrote and discussed at today's ov.

## 2016-10-16 NOTE — Assessment & Plan Note (Signed)
-   Allergy profile 03/11/16  >  Eos 0.2 /  IgE  359 with pos RAST grass / trees  - flutter valve 08/11/16  Trial of neurontin 100 tid 09/01/2016    - Sinus CT 09/03/2016 >>> Visualized paranasal sinuses are clear. No air-fluid levels or mucosal thickening. Orbital soft tissues and visualized bony structures unremarkable. - Kozlow eval 09/21/16 rec consider immunotherapy  Improved but not following instructions re use of flutter/ hard rock candy to prevent throat clearing/ reviewed

## 2016-10-16 NOTE — Assessment & Plan Note (Signed)
Body mass index is 42.6   Lab Results  Component Value Date   TSH 2.34 06/29/2016     Contributing to gerd tendency/ doe/reviewed the need and the process to achieve and maintain neg calorie balance > defer f/u primary care including intermittently monitoring thyroid status

## 2016-10-16 NOTE — Assessment & Plan Note (Signed)
HCO3  06/29/16  = 32  C/w hypercarbia    As of 10/15/2016 rx = 3lpm 24/7   Adequate control on present rx, reviewed > no change in rx needed

## 2016-10-22 ENCOUNTER — Other Ambulatory Visit: Payer: Self-pay | Admitting: Internal Medicine

## 2016-10-25 ENCOUNTER — Other Ambulatory Visit: Payer: Self-pay | Admitting: Pulmonary Disease

## 2016-10-26 ENCOUNTER — Other Ambulatory Visit: Payer: Self-pay | Admitting: Internal Medicine

## 2016-10-27 ENCOUNTER — Other Ambulatory Visit: Payer: Self-pay | Admitting: Internal Medicine

## 2016-10-28 ENCOUNTER — Other Ambulatory Visit: Payer: Self-pay | Admitting: Family Medicine

## 2016-10-29 ENCOUNTER — Other Ambulatory Visit: Payer: Medicare Other

## 2016-10-29 ENCOUNTER — Other Ambulatory Visit: Payer: Self-pay | Admitting: Family Medicine

## 2016-10-29 DIAGNOSIS — E039 Hypothyroidism, unspecified: Secondary | ICD-10-CM

## 2016-10-29 DIAGNOSIS — N189 Chronic kidney disease, unspecified: Secondary | ICD-10-CM

## 2016-10-29 DIAGNOSIS — Z79899 Other long term (current) drug therapy: Secondary | ICD-10-CM

## 2016-10-29 DIAGNOSIS — M1990 Unspecified osteoarthritis, unspecified site: Secondary | ICD-10-CM

## 2016-10-29 DIAGNOSIS — F419 Anxiety disorder, unspecified: Secondary | ICD-10-CM

## 2016-10-29 DIAGNOSIS — I10 Essential (primary) hypertension: Secondary | ICD-10-CM

## 2016-11-01 ENCOUNTER — Other Ambulatory Visit: Payer: Medicare Other

## 2016-11-01 DIAGNOSIS — M1990 Unspecified osteoarthritis, unspecified site: Secondary | ICD-10-CM

## 2016-11-01 DIAGNOSIS — F419 Anxiety disorder, unspecified: Secondary | ICD-10-CM

## 2016-11-01 DIAGNOSIS — E039 Hypothyroidism, unspecified: Secondary | ICD-10-CM

## 2016-11-01 DIAGNOSIS — I10 Essential (primary) hypertension: Secondary | ICD-10-CM

## 2016-11-01 DIAGNOSIS — N189 Chronic kidney disease, unspecified: Secondary | ICD-10-CM

## 2016-11-01 DIAGNOSIS — Z79899 Other long term (current) drug therapy: Secondary | ICD-10-CM

## 2016-11-01 LAB — CBC WITH DIFFERENTIAL/PLATELET
BASOS PCT: 0 %
Basophils Absolute: 0 cells/uL (ref 0–200)
EOS ABS: 168 {cells}/uL (ref 15–500)
EOS PCT: 2 %
HCT: 42 % (ref 38.5–50.0)
Hemoglobin: 13.7 g/dL (ref 13.0–17.0)
LYMPHS PCT: 20 %
Lymphs Abs: 1680 cells/uL (ref 850–3900)
MCH: 31.6 pg (ref 27.0–33.0)
MCHC: 32.6 g/dL (ref 32.0–36.0)
MCV: 96.8 fL (ref 80.0–100.0)
MONOS PCT: 6 %
MPV: 10.6 fL (ref 7.5–12.5)
Monocytes Absolute: 504 cells/uL (ref 200–950)
NEUTROS ABS: 6048 {cells}/uL (ref 1500–7800)
Neutrophils Relative %: 72 %
PLATELETS: 198 10*3/uL (ref 140–400)
RBC: 4.34 MIL/uL (ref 4.20–5.80)
RDW: 14.8 % (ref 11.0–15.0)
WBC: 8.4 10*3/uL (ref 3.8–10.8)

## 2016-11-01 LAB — COMPLETE METABOLIC PANEL WITH GFR
ALT: 22 U/L (ref 9–46)
AST: 16 U/L (ref 10–35)
Albumin: 4 g/dL (ref 3.6–5.1)
Alkaline Phosphatase: 87 U/L (ref 40–115)
BUN: 19 mg/dL (ref 7–25)
CALCIUM: 9.3 mg/dL (ref 8.6–10.3)
CHLORIDE: 97 mmol/L — AB (ref 98–110)
CO2: 38 mmol/L — AB (ref 20–31)
CREATININE: 0.83 mg/dL (ref 0.70–1.25)
GFR, Est African American: >89 mL/min (ref >=60)
GFR, Est Non African American: >89 mL/min (ref >=60)
GLUCOSE: 93 mg/dL (ref 70–99)
POTASSIUM: 4.6 mmol/L (ref 3.5–5.3)
SODIUM: 143 mmol/L (ref 135–146)
Total Bilirubin: 0.3 mg/dL (ref 0.2–1.2)
Total Protein: 6.7 g/dL (ref 6.1–8.1)

## 2016-11-01 LAB — LIPID PANEL
CHOL/HDL RATIO: 5.1 ratio — AB (ref <5.0)
CHOLESTEROL: 174 mg/dL (ref <200)
HDL: 34 mg/dL — ABNORMAL LOW (ref >40)
LDL CALC: 63 mg/dL (ref <100)
Triglycerides: 383 mg/dL — ABNORMAL HIGH (ref <150)
VLDL: 77 mg/dL — AB (ref <30)

## 2016-11-01 LAB — TSH: TSH: 1.72 m[IU]/L (ref 0.40–4.50)

## 2016-11-02 ENCOUNTER — Ambulatory Visit: Payer: Medicare Other | Admitting: Family Medicine

## 2016-11-02 LAB — HEMOGLOBIN A1C
HEMOGLOBIN A1C: 5.9 % — AB (ref <5.7)
Mean Plasma Glucose: 123 mg/dL

## 2016-11-09 ENCOUNTER — Encounter: Payer: Self-pay | Admitting: Family Medicine

## 2016-11-09 ENCOUNTER — Ambulatory Visit (INDEPENDENT_AMBULATORY_CARE_PROVIDER_SITE_OTHER): Payer: Medicare Other | Admitting: Family Medicine

## 2016-11-09 VITALS — BP 90/60 | HR 120 | Temp 98.8°F | Resp 22 | Ht 63.0 in | Wt 252.0 lb

## 2016-11-09 DIAGNOSIS — R7303 Prediabetes: Secondary | ICD-10-CM | POA: Diagnosis not present

## 2016-11-09 DIAGNOSIS — I952 Hypotension due to drugs: Secondary | ICD-10-CM

## 2016-11-09 DIAGNOSIS — R000 Tachycardia, unspecified: Secondary | ICD-10-CM

## 2016-11-09 DIAGNOSIS — E7800 Pure hypercholesterolemia, unspecified: Secondary | ICD-10-CM | POA: Diagnosis not present
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
## 2016-11-15 ENCOUNTER — Encounter: Payer: Self-pay | Admitting: Internal Medicine

## 2016-11-15 ENCOUNTER — Ambulatory Visit (INDEPENDENT_AMBULATORY_CARE_PROVIDER_SITE_OTHER): Payer: Medicare Other | Admitting: Internal Medicine

## 2016-11-15 VITALS — BP 120/66 | HR 105 | Ht 64.0 in | Wt 250.8 lb

## 2016-11-15 DIAGNOSIS — J449 Chronic obstructive pulmonary disease, unspecified: Secondary | ICD-10-CM | POA: Diagnosis not present

## 2016-11-15 DIAGNOSIS — J9612 Chronic respiratory failure with hypercapnia: Secondary | ICD-10-CM | POA: Diagnosis not present

## 2016-11-15 DIAGNOSIS — R05 Cough: Secondary | ICD-10-CM | POA: Diagnosis not present

## 2016-11-15 DIAGNOSIS — J9611 Chronic respiratory failure with hypoxia: Secondary | ICD-10-CM

## 2016-11-15 DIAGNOSIS — R058 Other specified cough: Secondary | ICD-10-CM

## 2016-11-15 MED ORDER — CEFDINIR 300 MG PO CAPS: 300.0000 mg | ORAL_CAPSULE | Freq: Two times a day (BID) | ORAL | 0 refills | Status: DC

## 2016-11-15 NOTE — Patient Instructions (Addendum)
omnicef 300 mg twice daily x 10 days - stop if any rash/ itching    Plan A = Automatic = symbicort/ spiriva per med calendar   Plan B = Backup Only use your albuterol (proair) as a rescue medication to be used if you can't catch your breath by resting or doing a relaxed purse lip breathing pattern.  - The less you use it, the better it will work when you need it. - Ok to use the inhaler up to 2 puffs  every 4 hours if you must but call for appointment if use goes up over your usual need - Don't leave home without it !!  (think of it like the spare tire for your car)   Plan C = Crisis - only use your albuterol nebulizer if you first try Plan B and it fails to help > ok to use the nebulizer up to every 4 hours    Plan D = Deltasone = prednisone If start needing more nebulizer than usual then take prednisone as per med calendar   Plan E = ER - go to ER or call 911 if all else fails    Please schedule a follow up office visit in 6 weeks, call sooner if needed

## 2016-11-15 NOTE — Progress Notes (Signed)
Subjective:     Patient ID: [name], male   DOB: 1951-05-25,  MRN: [id number]     HPI 65 yo quit smoking  2012   with GOLD 2 COPD    02/09/2016 Follow up : COPD Exacerbation/PNA  Pt returns for follow up for COPD exacerbation  Recent admission for COPD flare and PNA .  He was treated with abx. And diuresis  He is feeling better. Decreased cough and congestion .  CXR today shows stable basilar density c/w probable scarring.  Remains on Symbicort and Spiriva .  On O2 at 3l/m . Helios marathon works well.  Vaccines utd.  Denies chest pain, orthopnea, edema or hemoptysis.        Immunization History  Administered Date(s) Administered  . Influenza Split 10/25/2011  . Influenza Whole 09/10/2009, 09/04/2010, 09/12/2012  . Influenza,inj,Quad PF,36+ Mos 10/17/2013, 09/25/2014, 10/22/2015  . Pneumococcal Conjugate-13 01/28/2015  . Pneumococcal Polysaccharide-23 12/13/2006    Tests: PFT 12/24/09>>FEV1 1.60(57%), FEV1% 52, TLC 6.86(130%), DLCO 67%, no BD PSG with 3 liters oxygen 06/12/12>>AHI 1.2, SpO2 low 90%, PLMI 1. Echo 09/27/12>>EF 55 to 60%, mild RA dilation Echo 12/2015 EF 55-60%, Gr 1 DD       rec Continue on Symbicort and Spiriva .  Low salt diet  Continue on Oxygen 3l/m    03/11/2016 acute extended ov/Jakaiden Fill re:  Chief Complaint  Patient presents with  . Acute Visit    increased SOB x3 days with decreased mucus production (clear/milky), tightness, occasional wheezing.  takes 3x as long to recover from exertion.  denies any f/c/s, hemoptysis   was "better"   on 3lpm 24/7 and neb 3 x neb despite symbicort/spiriva>>  then worse x 3 days assoc with dysphagia despite taking carafate qid rec Plan A = Automatic = Symbicort 160 Take 2 puffs first thing in am and then another 2 puffs about 12 hours later.                                      Spiriva 2 pffs and Pantoprazole 40 mg Take 30-60 min before first meal of the day plus continue  zantac at  bedtime  Plan B = Backup Only use your albuterol (proair) as a rescue medication  Plan C = Crisis - only use your albuterol nebulizer if you first try Plan B and it fails to help > ok to use the nebulizer up to every 4 hours but if start needing it regularly call for immediate appointment Prednisone 10 mg take  4 each am x 2 days,   2 each am x 2 days,  1 each am x 2 days and stop  GERD  Diet     04/28/2016 acute ext ov/Xandra Laramee re: GOLD II at baseline/aecopd maint rx symb 160 2bid/ spriiva respimat Chief Complaint  Patient presents with  . Acute Visit    Pt c/o increased SOB, cough with milky mucus, increased HR and decreased O2 with exertion. Pt denies CP/tightness, f/n/v. Pt has been using flutter valve daily.   back to baseline and not needing much saba at all until sob/cough worse one week prior to OV  - has futter not using  rec Plan A = Automatic = Symbicort 160 Take 2 puffs first thing in am and then another 2 puffs about 12 hours later.   Spiriva 2 pffs and Pantoprazole 40 mg Take 30-60 min before first meal of  the day plus continue  zantac at bedtime  Plan B = Backup Only use your albuterol (proair)  Plan C = Crisis - only use your albuterol nebulizer if you first try Plan B and it fails to help > ok to use the nebulizer up to every 4 hours but if start needing it regularly call for immediate appointment For cough > mucinex dm 1200 mg every 12 hours and supplement with norco if still coughing and use the flutter as much as you can zpak Prednisone 10 mg take  4 each am x 2 days,   2 each am x 2 days,  1 each am x 2 days and stop  GERD diet    08/11/2016  Acute ov/Esterlene Atiyeh re:  AECOPD/ GOLD II symb/spiriva/singulair  Chief Complaint  Patient presents with  . Acute Visit    Pt c/o increased cough and SOB for the past 2 days. He is coughing up large amounts of clear/milky sputum.    at baseline maybe one saba hfa / 02 3lpm 24/7 Already on norco 4 x daily at baseline Abrupt onset x 2  days worse sob with  Cough> milky sputum only, nothing really purulent  Lots of lifesavers but mint  Comfortable at rest p saba   rec Plan A = Automatic = Continue Symbicort 160 Take 2 puffs first thing in am and then another 2 puffs about 12 hours later. Spiriva 2 pffs and Pantoprazole 40 mg Take 30-60 min before first meal of the day plus continue  zantac at bedtime  Plan B = Backup Only use your albuterol (proair) as a rescue medication Plan C = Crisis - only use your albuterol nebulizer if you first try Plan B and it fails to help > ok to use the nebulizer up to every 4 hours but if start needing it regularly call for immediate appointment For cough > mucinex dm 1200 mg every 12 hours and supplement with norco if still coughing and use the flutter as much as you can zpak Prednisone 10 mg take  4 each am x 2 days,   2 each am x 2 days,  1 each am x 2 days and stop  GERD diet  Please remember to go to the  x-ray department downstairs for your tests - we will call you with the results when they are available.  if can't get comfortable at rest after the neb will need to go to ER    09/01/2016  Acute extended ov/Adlean Hardeman re: onset of flare cough [facility]so[facility]  [facility]  Chief Complaint  Patient presents with  . Acute Visit    Pt c/o increase in SOB x 4 days. C/o prod cough with clear to white mucus and chest tightness. Pt states his O2 has been in the 80's. Pt denies f/c/s.   did improve transiently p last ov but never back to baseline then worse x 4 days with severe hacking cough/ min productive all white mucus and increase saba over baseline. Not using flutter valve or mucinex dm as instructed bu taking norc qid for back pain    >>rx Gabapentin for cough , pred taper , CT sinus >neg     09/20/2016 NP Follow up : GOLD II COPD /O2 3 L, AR w/ positive Rast /High IgE. ., UACS   Patient returns for a two-week follow-up for recent COPD flare. He was set up for a CT sinus that showed no  evidence of acute sinusitis. He  was given a prednisone taper and started Gabapentin 100mg  Three times a day   He is starting to feel some better. Cough is decreased some.  We reviewed all his med and organized them into a med calendar with pt education .  Talked about cost of inhaler . We discussed starting patient assistance papers.  rec Follow med calendar closely and bring to each visit.  Continue on current regimen   09/21/16 Kozlow eval > not tested as on antihistamines> consider low dose maint pred    10/15/2016  f/u ov/Laray Rivkin re: COPD II/ MO/ 02 dep on symb/spiriva/confused with action plan for sabas despite written action plan in hand/ still occ smoking  Chief Complaint  Patient presents with  . Follow-up    Breathing is "not too bad" today. He is using albuterol inhaler and neb at least once per day.    does report much better with prednisone in sob/rhinitis/cough for up to a week with discolored nasal secretions [facility] at [facility]  Even on 3lpm  rec If breathing or coughing worse > prednisone 10 mg x 2 daily until better then 1 x 2 days then 1/2 x days and stop For cough > mucinex dm up to 1200 mg every 12 hours and use the flutter as much as possible   See calendar for specific medication instructions .  Please schedule a follow up office visit in 4 weeks, sooner if needed  - add consider refer to rehab    11/15/2016  f/u ov/Lawerance Matsuo re:  COPD GOLD II/ MO/ 02 dep on sym/spiriva  Chief Complaint  Patient presents with  . Follow-up    Pt c/o chest tightness for the past 5 days.  His cough has been more prod and sputum is clear to green. His nose has been congested.    better until 5 d prior to OV  /chest tightness  better p neb/ did not start pred burst yet as per action plan    No obvious day to day or daytime variability or assoc excess/ purulent sputum or mucus plugs or hemoptysis or cp or chest tightness, subjective wheeze or overt sinus or hb symptoms. No unusual exp hx or  h/o childhood pna/ asthma or knowledge of premature birth.  Sleeping ok without nocturnal  or early am exacerbation  of respiratory  c/o's or need for noct saba. Also denies any obvious fluctuation of symptoms with weather or environmental changes or other aggravating or alleviating factors except as outlined above   Current Medications, Allergies, Complete Past Medical History, Past Surgical History, Family History, and Social History were reviewed in [employer] record.  ROS  The following are not active complaints unless bolded sore throat, dysphagia, dental problems, itching, sneezing,  nasal congestion or excess/ purulent secretions, ear ache,   fever, chills, sweats, unintended wt loss, classically pleuritic or exertional cp,  orthopnea pnd or leg swelling, presyncope, palpitations, abdominal pain, anorexia, nausea, vomiting, diarrhea  or change in bowel or bladder habits, change in stools or urine, dysuria,hematuria,  rash, arthralgias, visual complaints, headache, numbness, weakness or ataxia or problems with walking or coordination,  change in mood/affect or memory.                Objective:   Physical Exam   obese amb wm nad      11/15/2016        250  10/15/16 248 lb 3.2 oz (112.6 kg)  09/21/16 251 lb 3.2 oz (113.9 kg)  09/20/16 248 lb (112.5 kg)    Vital signs reviewed - Note on arrival 02 sats  93% on 3lpm     HEENT: nl dentition, turbinates, and oropharynx. Nl external ear canals without cough reflex   NECK :  without JVD/Nodes/TM/ nl carotid upstrokes bilaterally   LUNGS: no acc muscle use,  Decreased bs in bases with faint end exp bilateral wheeze  CV:  RRR  no s3 or murmur or increase in P2, no edema   ABD:  Very obese/ large pannus/ soft and nontender with nl inspiratory excursion in the supine position. No bruits or organomegaly, bowel sounds nl  MS:  Nl gait/ ext warm without deformities, calf tenderness, cyanosis or clubbing No  obvious joint restrictions   SKIN: warm and dry without lesions    NEURO:  alert, approp, nl sensorium with  no motor deficits

## 2016-11-16 NOTE — Assessment & Plan Note (Signed)
-   Allergy profile 03/11/16  >  Eos 0.2 /  IgE  359 with pos RAST grass / trees  - flutter valve 08/11/16  Trial of neurontin 100 tid 09/01/2016    - Sinus CT 09/03/2016 >>> Visualized paranasal sinuses are clear. No air-fluid levels or mucosal thickening. Orbital soft tissues and visualized bony structures unremarkable. - Kozlow eval 09/21/16 rec consider immunotherapy  Mild flare with nasal d/c > omnicef x 10 day (pcn allergy= hives only/ advised to stop if any itch/rash)

## 2016-11-16 NOTE — Assessment & Plan Note (Signed)
HCO3  11/01/16  = 38 C/w worsening hypercarbia    As of 11/15/2016 rx = 3lpm 24/7    Adequate control on present rx, reviewed in detail with pt > no change in rx needed

## 2016-11-16 NOTE — Assessment & Plan Note (Signed)
Spirometry 10/15/2016  FEV1 1.41 (52%)  Ratio 61 p am symb/spiriva/saba w/in 4h - added prednisone 20 max and taper off for flares as part of action plan 10/15/2016 >>>  Mild flare requiring neb use > did not follow action plan  I had an extended discussion with the patient reviewing all relevant studies completed to date and  lasting 15 to 20 minutes of a 25 minute visit    Each maintenance medication was reviewed in detail including most importantly the difference between maintenance and prns and under what circumstances the prns are to be triggered using an action plan format that is not reflected in the computer generated alphabetically organized AVS but trather by a customized med calendar that reflects the AVS meds with confirmed 100% correlation.   In addition, Please see AVS for unique instructions that I personally wrote and verbalized to the the pt in detail and then reviewed with pt  by my nurse highlighting any  changes in therapy recommended at today's visit to their plan of care.

## 2016-11-17 ENCOUNTER — Telehealth: Payer: Self-pay | Admitting: Internal Medicine

## 2016-11-17 NOTE — Telephone Encounter (Signed)
Spoke with the pt  He is requesting 90 day supply of lorazepam 0.5 mg  He can take up to 1 every 8 hrs as needed, but usually takes just one a day  90 day supply would be 270 tabs  Please advise thanks!

## 2016-11-18 ENCOUNTER — Ambulatory Visit: Payer: Medicare Other | Admitting: Family Medicine

## 2016-11-18 MED ORDER — LORAZEPAM 0.5 MG PO TABS: 0.5000 mg | ORAL_TABLET | Freq: Three times a day (TID) | ORAL | 0 refills | Status: DC | PRN

## 2016-11-18 NOTE — Telephone Encounter (Signed)
I don't think I was the original prescriber as I don't use that med - see who was and if it's one of us than ok to give 90 for a 90 day supply, otherwise refer him back to whoever proscribed it.

## 2016-11-18 NOTE — Telephone Encounter (Signed)
Original prescriber looks to be VS. Rx has been called in per MW. Pt is aware. Nothing further was needed.

## 2016-11-24 ENCOUNTER — Telehealth: Payer: Self-pay | Admitting: Internal Medicine

## 2016-11-24 MED ORDER — MONTELUKAST SODIUM 10 MG PO TABS: ORAL_TABLET | ORAL | 3 refills | Status: DC

## 2016-11-24 MED ORDER — ALBUTEROL SULFATE HFA 108 (90 BASE) MCG/ACT IN AERS: INHALATION_SPRAY | RESPIRATORY_TRACT | 3 refills | Status: DC

## 2016-11-24 MED ORDER — FLUTICASONE PROPIONATE 50 MCG/ACT NA SUSP: 2.0000 | Freq: Every day | NASAL | 3 refills | Status: DC

## 2016-11-24 MED ORDER — TIOTROPIUM BROMIDE MONOHYDRATE 2.5 MCG/ACT IN AERS: INHALATION_SPRAY | RESPIRATORY_TRACT | 3 refills | Status: DC

## 2016-11-24 MED ORDER — BUDESONIDE-FORMOTEROL FUMARATE 160-4.5 MCG/ACT IN AERO: INHALATION_SPRAY | RESPIRATORY_TRACT | 3 refills | Status: DC

## 2016-11-24 MED ORDER — PANTOPRAZOLE SODIUM 40 MG PO TBEC: 40.0000 mg | DELAYED_RELEASE_TABLET | Freq: Every day | ORAL | 3 refills | Status: DC

## 2016-11-24 MED ORDER — ALBUTEROL SULFATE (2.5 MG/3ML) 0.083% IN NEBU: 2.5000 mg | INHALATION_SOLUTION | Freq: Four times a day (QID) | RESPIRATORY_TRACT | 3 refills | Status: DC | PRN
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
## 2016-11-25 ENCOUNTER — Other Ambulatory Visit: Payer: Self-pay | Admitting: Internal Medicine

## 2016-11-25 MED ORDER — ALBUTEROL SULFATE HFA 108 (90 BASE) MCG/ACT IN AERS: 1.0000 | INHALATION_SPRAY | RESPIRATORY_TRACT | 3 refills | Status: DC | PRN

## 2016-11-26 ENCOUNTER — Ambulatory Visit: Payer: Medicare Other | Admitting: Family Medicine

## 2016-11-29 ENCOUNTER — Other Ambulatory Visit: Payer: Self-pay | Admitting: Family Medicine

## 2016-11-29 MED ORDER — DICLOFENAC SODIUM 75 MG PO TBEC: 75.0000 mg | DELAYED_RELEASE_TABLET | Freq: Two times a day (BID) | ORAL | 2 refills | Status: DC

## 2016-11-29 NOTE — Telephone Encounter (Signed)
Medication called/sent to requested pharmacy  

## 2016-11-30 ENCOUNTER — Ambulatory Visit (INDEPENDENT_AMBULATORY_CARE_PROVIDER_SITE_OTHER): Payer: Medicare Other | Admitting: Family Medicine

## 2016-11-30 ENCOUNTER — Encounter: Payer: Self-pay | Admitting: Family Medicine

## 2016-11-30 VITALS — BP 120/58 | HR 104 | Temp 99.0°F | Resp 24 | Ht 63.0 in | Wt 247.0 lb

## 2016-11-30 DIAGNOSIS — R000 Tachycardia, unspecified: Secondary | ICD-10-CM
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
## 2016-12-02 ENCOUNTER — Other Ambulatory Visit: Payer: Self-pay | Admitting: Pulmonary Disease

## 2016-12-27 ENCOUNTER — Ambulatory Visit (INDEPENDENT_AMBULATORY_CARE_PROVIDER_SITE_OTHER)
Admission: RE | Admit: 2016-12-27 | Discharge: 2016-12-27 | Disposition: A | Discharge status: Home / Self Care | Payer: Medicare HMO | Source: Ambulatory Visit | Attending: Internal Medicine | Admitting: Internal Medicine

## 2016-12-27 ENCOUNTER — Ambulatory Visit (INDEPENDENT_AMBULATORY_CARE_PROVIDER_SITE_OTHER): Payer: Medicare HMO | Admitting: Internal Medicine

## 2016-12-27 ENCOUNTER — Encounter: Payer: Self-pay | Admitting: Internal Medicine

## 2016-12-27 VITALS — BP 138/62 | HR 103 | Temp 98.5°F | Ht 64.0 in | Wt 250.0 lb

## 2016-12-27 DIAGNOSIS — R0602 Shortness of breath: Secondary | ICD-10-CM | POA: Diagnosis not present

## 2016-12-27 DIAGNOSIS — J449 Chronic obstructive pulmonary disease, unspecified: Secondary | ICD-10-CM

## 2016-12-27 DIAGNOSIS — J9611 Chronic respiratory failure with hypoxia: Secondary | ICD-10-CM

## 2016-12-27 DIAGNOSIS — R058 Other specified cough: Secondary | ICD-10-CM

## 2016-12-27 DIAGNOSIS — R05 Cough: Secondary | ICD-10-CM

## 2016-12-27 DIAGNOSIS — J9612 Chronic respiratory failure with hypercapnia: Secondary | ICD-10-CM

## 2016-12-27 MED ORDER — TIOTROPIUM BROMIDE MONOHYDRATE 2.5 MCG/ACT IN AERS
INHALATION_SPRAY | RESPIRATORY_TRACT | 0 refills | Status: DC
Start: 2016-12-27 — End: 2017-10-31

## 2016-12-27 MED ORDER — AZITHROMYCIN 250 MG PO TABS: ORAL_TABLET | ORAL | 0 refills | Status: DC

## 2016-12-27 MED ORDER — BUDESONIDE-FORMOTEROL FUMARATE 160-4.5 MCG/ACT IN AERO: 2.0000 | INHALATION_SPRAY | Freq: Two times a day (BID) | RESPIRATORY_TRACT | 0 refills | Status: DC

## 2016-12-27 NOTE — Patient Instructions (Addendum)
zpak  Remember to use the flutter as much as possible   Please remember to go to the x-ray department downstairs for your tests - we will call you with the results when they are available.  See calendar for specific medication instructions and bring it back for each and every office visit for every healthcare provider you see.  Without it,  you may not receive the best quality medical care that we feel you deserve.  You will note that the calendar groups together  your maintenance  medications that are timed at particular times of the day.  Think of this as your checklist for what your doctor has instructed you to do until your next evaluation to see what benefit  there is  to staying on a consistent group of medications intended to keep you well.  The other group at the bottom is entirely up to you to use as you see fit  for specific symptoms that may arise between visits that require you to treat them on an as needed basis.  Think of this as your action plan or "what if" list.   Separating the top medications from the bottom group is fundamental to providing you adequate care going forward.     Please schedule a follow up office visit in 6 weeks, call sooner if needed and see Tammy  NP bring all meds/calendars/inhalers/flutter    And drug formulary if your company is changing any of your preferred respiratory meds

## 2016-12-27 NOTE — Progress Notes (Signed)
Subjective:     Patient ID: [name], male   DOB: [birth year],  MRN: [id number]     HPI 66 yo quit smoking  2012   with GOLD 2 COPD    02/09/2016 Follow up : COPD Exacerbation/PNA  Pt returns for follow up for COPD exacerbation  Recent admission for COPD flare and PNA .  He was treated with abx. And diuresis  He is feeling better. Decreased cough and congestion .  CXR today shows stable basilar density c/w probable scarring.  Remains on Symbicort and Spiriva .  On O2 at 3l/m . Helios marathon works well.  Vaccines utd.  Denies chest pain, orthopnea, edema or hemoptysis.        Immunization History  Administered Date(s) Administered  . Influenza Split 10/25/2011  . Influenza Whole 09/10/2009, 09/04/2010, 09/12/2012  . Influenza,inj,Quad PF,36+ Mos 10/17/2013, 09/25/2014, 10/22/2015  . Pneumococcal Conjugate-13 01/28/2015  . Pneumococcal Polysaccharide-23 12/13/2006    Tests: PFT 12/24/09>>FEV1 1.60(57%), FEV1% 52, TLC 6.86(130%), DLCO 67%, no BD PSG with 3 liters oxygen 06/12/12>>AHI 1.2, SpO2 low 90%, PLMI 1. Echo 09/27/12>>EF 55 to 60%, mild RA dilation Echo 12/2015 EF 55-60%, Gr 1 DD       rec Continue on Symbicort and Spiriva .  Low salt diet  Continue on Oxygen 3l/m    03/11/2016 acute extended ov/Amon Costilla re:  Chief Complaint  Patient presents with  . Acute Visit    increased SOB x3 days with decreased mucus production (clear/milky), tightness, occasional wheezing.  takes 3x as long to recover from exertion.  denies any f/c/s, hemoptysis   was "better"   on 3lpm 24/7 and neb 3 x neb despite symbicort/spiriva>>  then worse x 3 days assoc with dysphagia despite taking carafate qid rec Plan A = Automatic = Symbicort 160 Take 2 puffs first thing in am and then another 2 puffs about 12 hours later.                                      Spiriva 2 pffs and Pantoprazole 40 mg Take 30-60 min before first meal of the day plus continue  zantac at  bedtime  Plan B = Backup Only use your albuterol (proair) as a rescue medication  Plan C = Crisis - only use your albuterol nebulizer if you first try Plan B and it fails to help > ok to use the nebulizer up to every 4 hours but if start needing it regularly call for immediate appointment Prednisone 10 mg take  4 each am x 2 days,   2 each am x 2 days,  1 each am x 2 days and stop  GERD  Diet     04/28/2016 acute ext ov/Ceil Roderick re: GOLD II at baseline/aecopd maint rx symb 160 2bid/ spriiva respimat Chief Complaint  Patient presents with  . Acute Visit    Pt c/o increased SOB, cough with milky mucus, increased HR and decreased O2 with exertion. Pt denies CP/tightness, f/n/v. Pt has been using flutter valve daily.   back to baseline and not needing much saba at all until sob/cough worse one week prior to OV  - has futter not using  rec Plan A = Automatic = Symbicort 160 Take 2 puffs first thing in am and then another 2 puffs about 12 hours later.   Spiriva 2 pffs and Pantoprazole 40 mg Take 30-60 min before first meal of  the day plus continue  zantac at bedtime  Plan B = Backup Only use your albuterol (proair)  Plan C = Crisis - only use your albuterol nebulizer if you first try Plan B and it fails to help > ok to use the nebulizer up to every 4 hours but if start needing it regularly call for immediate appointment For cough > mucinex dm 1200 mg every 12 hours and supplement with norco if still coughing and use the flutter as much as you can zpak Prednisone 10 mg take  4 each am x 2 days,   2 each am x 2 days,  1 each am x 2 days and stop  GERD diet    08/11/2016  Acute ov/Peytin Dechert re:  AECOPD/ GOLD II symb/spiriva/singulair  Chief Complaint  Patient presents with  . Acute Visit    Pt c/o increased cough and SOB for the past 2 days. He is coughing up large amounts of clear/milky sputum.    at baseline maybe one saba hfa / 02 3lpm 24/7 Already on norco 4 x daily at baseline Abrupt onset x 2  days worse sob with  Cough> milky sputum only, nothing really purulent  Lots of lifesavers but mint  Comfortable at rest p saba   rec Plan A = Automatic = Continue Symbicort 160 Take 2 puffs first thing in am and then another 2 puffs about 12 hours later. Spiriva 2 pffs and Pantoprazole 40 mg Take 30-60 min before first meal of the day plus continue  zantac at bedtime  Plan B = Backup Only use your albuterol (proair) as a rescue medication Plan C = Crisis - only use your albuterol nebulizer if you first try Plan B and it fails to help > ok to use the nebulizer up to every 4 hours but if start needing it regularly call for immediate appointment For cough > mucinex dm 1200 mg every 12 hours and supplement with norco if still coughing and use the flutter as much as you can zpak Prednisone 10 mg take  4 each am x 2 days,   2 each am x 2 days,  1 each am x 2 days and stop  GERD diet  Please remember to go to the  x-ray department downstairs for your tests - we will call you with the results when they are available.  if can't get comfortable at rest after the neb will need to go to ER    09/01/2016  Acute extended ov/Peyton Spengler re: onset of flare cough [facility]so[facility]  [facility]  Chief Complaint  Patient presents with  . Acute Visit    Pt c/o increase in SOB x 4 days. C/o prod cough with clear to white mucus and chest tightness. Pt states his O2 has been in the 80's. Pt denies f/c/s.   did improve transiently p last ov but never back to baseline then worse x 4 days with severe hacking cough/ min productive all white mucus and increase saba over baseline. Not using flutter valve or mucinex dm as instructed bu taking norc qid for back pain    >>rx Gabapentin for cough , pred taper , CT sinus >neg     09/20/2016 NP Follow up : GOLD II COPD /O2 3 L, AR w/ positive Rast /High IgE. ., UACS   Patient returns for a two-week follow-up for recent COPD flare. He was set up for a CT sinus that showed no  evidence of acute sinusitis. He  was given a prednisone taper and started Gabapentin 100mg  Three times a day   He is starting to feel some better. Cough is decreased some.  We reviewed all his med and organized them into a med calendar with pt education .  Talked about cost of inhaler . We discussed starting patient assistance papers.  rec Follow med calendar closely and bring to each visit.  Continue on current regimen   09/21/16 Kozlow eval > not tested as on antihistamines> consider low dose maint pred    10/15/2016  f/u ov/Kariel Skillman re: COPD II/ MO/ 02 dep on symb/spiriva/confused with action plan for sabas despite written action plan in hand/ still occ smoking  Chief Complaint  Patient presents with  . Follow-up    Breathing is "not too bad" today. He is using albuterol inhaler and neb at least once per day.    does report much better with prednisone in sob/rhinitis/cough for up to a week with discolored nasal secretions [facility] at [facility]  Even on 3lpm  rec If breathing or coughing worse > prednisone 10 mg x 2 daily until better then 1 x 2 days then 1/2 x days and stop For cough > mucinex dm up to 1200 mg every 12 hours and use the flutter as much as possible   See calendar for specific medication instructions .  Please schedule a follow up office visit in 4 weeks, sooner if needed  - add consider refer to rehab    11/15/2016  f/u ov/Gregoire Bennis re:  COPD GOLD II/ MO/ 02 dep on sym/spiriva  Chief Complaint  Patient presents with  . Follow-up    Pt c/o chest tightness for the past 5 days.  His cough has been more prod and sputum is clear to green. His nose has been congested.    better until 5 d prior to OV  /chest tightness  better p neb/ did not start pred burst yet as per action plan  REC omnicef 300 mg twice daily x 10 days - stop if any rash/ itching  Plan A = Automatic = symbicort/ spiriva per med calendar  Plan B = Backup Only use your albuterol (proair) as a rescue medication   Plan C = Crisis - only use your albuterol nebulizer if you first try Plan B and it fails to help > ok to use the nebulizer up to every 4 hours   Plan D = Deltasone = prednisone If start needing more nebulizer than usual then take prednisone as per med calendar      12/27/2016  f/u ov/Shai Rasmussen re:  Copd II/ MO/ 02 dep/ symb 160 / spiriva respimat 2.5  Chief Complaint  Patient presents with  . Follow-up    F/U COPD, tapered down from Pred, still SOB with exertion   mucus turning slt yellow now esp in am/ thinks coming down with cold / no flutter on hand as rec  Twice daily saba hfa/ once daily neb despite maint rx above  No longer needing pred though it is part of action plan   No obvious day to day or daytime variability or assoc  mucus plugs or hemoptysis or cp or chest tightness, subjective wheeze or overt sinus or hb symptoms. No unusual exp hx or h/o childhood pna/ asthma or knowledge of premature birth.  Sleeping ok without nocturnal  or early am exacerbation  of respiratory  c/o's or need for noct saba. Also denies any obvious fluctuation of symptoms with weather or environmental  changes or other aggravating or alleviating factors except as outlined above   Current Medications, Allergies, Complete Past Medical History, Past Surgical History, Family History, and Social History were reviewed in [employer] record.  ROS  The following are not active complaints unless bolded sore throat, dysphagia, dental problems, itching, sneezing,  nasal congestion or excess/ purulent secretions, ear ache,   fever, chills, sweats, unintended wt loss, classically pleuritic or exertional cp,  orthopnea pnd or leg swelling, presyncope, palpitations, abdominal pain, anorexia, nausea, vomiting, diarrhea  or change in bowel or bladder habits, change in stools or urine, dysuria,hematuria,  rash, arthralgias, visual complaints, headache, numbness, weakness or ataxia or problems with walking or  coordination,  change in mood/affect or memory.                Objective:   Physical Exam   obese amb wm nad    12/27/2016        250  11/15/2016        250  10/15/16 248 lb 3.2 oz (112.6 kg)  09/21/16 251 lb 3.2 oz (113.9 kg)  09/20/16 248 lb (112.5 kg)    Vital signs reviewed - Note on arrival 02 sats  94% on 3lpm     HEENT: nl dentition, turbinates, and oropharynx. Nl external ear canals without cough reflex   NECK :  without JVD/Nodes/TM/ nl carotid upstrokes bilaterally   LUNGS: no acc muscle use,  Decreased bs in bases with very faint end exp bilateral wheeze  CV:  RRR  no s3 or murmur or increase in P2, no edema   ABD:  Very obese/ large pannus/ soft and nontender with nl inspiratory excursion in the supine position. No bruits or organomegaly, bowel sounds nl  MS:  Nl gait/ ext warm without deformities, calf tenderness, cyanosis or clubbing No obvious joint restrictions   SKIN: warm and dry without lesions    NEURO:  alert, approp, nl sensorium with  no motor deficits         CXR PA and Lateral:   12/27/2016 :    I personally reviewed images and agree with radiology impression as follows:    Mild bibasilar subsegmental atelectasis and/or scarring again noted. Chest is stable from prior exams.

## 2016-12-28 NOTE — Assessment & Plan Note (Signed)
Body mass index is 42.91 - no change  Lab Results  Component Value Date   TSH 1.72 11/01/2016     Contributing to gerd tendency/ doe/reviewed the need and the process to achieve and maintain neg calorie balance > defer f/u primary care including intermittently monitoring thyroid status

## 2016-12-28 NOTE — Assessment & Plan Note (Signed)
HCO3  11/01/16  = 38  C/w hypercarbia    As of 12/27/2016 rx = 3lpm 24/7   Adequate control on present rx, reviewed in detail with pt > no change in rx needed

## 2016-12-28 NOTE — Assessment & Plan Note (Addendum)
-   Allergy profile 03/11/16  >  Eos 0.2 /  IgE  359 with pos RAST grass / trees  - flutter valve 08/11/16  Trial of neurontin 100 tid 09/01/2016    - Sinus CT 09/03/2016 >>> Visualized paranasal sinuses are clear. No air-fluid levels or mucosal thickening. Orbital soft tissues and visualized bony structures unremarkable. - Kozlow eval 09/21/16 rec consider immunotherapy  Adequate control on present rx, reviewed in detail with pt > no change in rx needed but reminded to use flutter as instructed to prevent cyclical airway trauma

## 2016-12-28 NOTE — Progress Notes (Signed)
Spoke with pt and notified of results per Dr. Wert. Pt verbalized understanding and denied any questions. 

## 2016-12-28 NOTE — Assessment & Plan Note (Addendum)
Spirometry 10/15/2016  FEV1 1.41 (52%)  Ratio 61 p am symb/spiriva/saba w/in 4h - added prednisone 20 max and taper off for flares as part of action plan 10/15/2016 >>>  Symptoms are much worse due to effects of obesity but relatively well compensated on present rx which is quite complex but well organized now (though did not bring the active med calendar as requested) - having mild uri now cover with zpak but no need to change maint or prns at this point  I had an extended discussion with the patient reviewing all relevant studies completed to date and  lasting 15 to 20 minutes of a 25 minute visit    Each maintenance medication was reviewed in detail including most importantly the difference between maintenance and prns and under what circumstances the prns are to be triggered using an action plan format that is not reflected in the computer generated alphabetically organized AVS but trather by a newly generated customized med calendar that reflects the AVS meds with confirmed 100% correlation.   In addition, Please see AVS for unique instructions that I personally wrote and verbalized to the the pt in detail and then reviewed with pt  by my nurse highlighting any  changes in therapy recommended at today's visit to their plan of care.

## 2017-01-03 DIAGNOSIS — Z79891 Long term (current) use of opiate analgesic: Secondary | ICD-10-CM | POA: Diagnosis not present

## 2017-01-03 DIAGNOSIS — G894 Chronic pain syndrome: Secondary | ICD-10-CM | POA: Diagnosis not present

## 2017-01-03 DIAGNOSIS — M25512 Pain in left shoulder: Secondary | ICD-10-CM | POA: Diagnosis not present

## 2017-01-03 DIAGNOSIS — M25511 Pain in right shoulder: Secondary | ICD-10-CM | POA: Diagnosis not present

## 2017-01-31 ENCOUNTER — Other Ambulatory Visit: Payer: Self-pay | Admitting: Internal Medicine

## 2017-02-02 ENCOUNTER — Telehealth: Payer: Self-pay | Admitting: Internal Medicine

## 2017-02-02 MED ORDER — CEFDINIR 300 MG PO CAPS: 300.0000 mg | ORAL_CAPSULE | Freq: Two times a day (BID) | ORAL | 0 refills | Status: DC

## 2017-02-02 NOTE — Telephone Encounter (Signed)
Spoke with pt. States that he is not feeling well. Reports chest congestion, cough, SOB and chest tightness. Cough is producing yellow mucus. Denies wheezing or fever. Symptoms started 2 days. Has been taking Mucinex with no relief. Tried to make pt an appointment and he declined. Pt wants Cefdinir called in.  MW - please advise. Thanks.  Instructions from 12/27/16 OV: zpak  Remember to use the flutter as much as possible   Please remember to go to the x-ray department downstairs for your tests - we will call you with the results when they are available.  See calendar for specific medication instructions and bring it back for each and every office visit for every healthcare provider you see.  Without it,  you may not receive the best quality medical care that we feel you deserve.  You will note that the calendar groups together  your maintenance  medications that are timed at particular times of the day.  Think of this as your checklist for what your doctor has instructed you to do until your next evaluation to see what benefit  there is  to staying on a consistent group of medications intended to keep you well.  The other group at the bottom is entirely up to you to use as you see fit  for specific symptoms that may arise between visits that require you to treat them on an as needed basis.  Think of this as your action plan or "what if" list.   Separating the top medications from the bottom group is fundamental to providing you adequate care going forward.     Please schedule a follow up office visit in 6 weeks, call sooner if needed and see Tammy  NP bring all meds/calendars/inhalers/flutter    And drug formulary if your company is changing any of your preferred respiratory meds

## 2017-02-02 NOTE — Telephone Encounter (Signed)
Spoke with pt and made him aware of MW recc. Pt agreed to the medication being called in. His rx was sent to his pharmacy of choice. Nothing further is needed at this time.

## 2017-02-02 NOTE — Telephone Encounter (Signed)
Ok omnicef 300 mg bid x 10 d

## 2017-02-10 ENCOUNTER — Encounter: Payer: Self-pay | Admitting: Adult Health

## 2017-02-10 ENCOUNTER — Other Ambulatory Visit: Payer: Self-pay | Admitting: Family Medicine

## 2017-02-10 ENCOUNTER — Ambulatory Visit (INDEPENDENT_AMBULATORY_CARE_PROVIDER_SITE_OTHER): Payer: Medicare HMO | Admitting: Adult Health

## 2017-02-10 DIAGNOSIS — J9611 Chronic respiratory failure with hypoxia: Secondary | ICD-10-CM | POA: Diagnosis not present

## 2017-02-10 DIAGNOSIS — J449 Chronic obstructive pulmonary disease, unspecified: Secondary | ICD-10-CM

## 2017-02-10 NOTE — Patient Instructions (Signed)
Finish Cefdinir .  Taper prednisone as directed.  Follow med calendar closely and bring to each visit.  Continue on current regimen  Follow up with Dr. Sherene SiresWert  In 2 months  and As needed   Please contact office for sooner follow up if symptoms do not improve or worsen or seek emergency care
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
## 2017-02-10 NOTE — Assessment & Plan Note (Signed)
Recent flare with bronchitis now improving with abx and steroids  Patient's medications were reviewed today and patient education was given. Computerized medication calendar was adjusted/completed   Plan  Patient Instructions  Finish Cefdinir .  Taper prednisone as directed.  Follow med calendar closely and bring to each visit.  Continue on current regimen  Follow up with Dr. Sherene SiresWert  In 2 months  and As needed   Please contact office for sooner follow up if symptoms do not improve or worsen or seek emergency care

## 2017-02-10 NOTE — Assessment & Plan Note (Signed)
Cont on o2 .  

## 2017-02-11 NOTE — Progress Notes (Signed)
Chart and office note reviewed in detail  > agree with a/p as outlined    

## 2017-02-18 ENCOUNTER — Telehealth: Payer: Self-pay | Admitting: Internal Medicine

## 2017-02-18 MED ORDER — PREDNISONE 10 MG PO TABS: ORAL_TABLET | ORAL | 0 refills | Status: DC

## 2017-02-18 NOTE — Telephone Encounter (Signed)
Pt c/o worsening sob at rest and with exertion, wheezing, fatigue, low 02 sats X2 days.  Sats dropping as low as 83% on 3lpm- has adjusted 02 to keep sats up.  Pt recently came off of cefdinir and prednisone prescribed by MW.     [facility] [address]   MW please advise on further recs.  Thanks. Below are 3/1 avs from ov with TP:   Plan  Patient Instructions  Finish Cefdinir .  Taper prednisone as directed.  Follow med calendar closely and bring to each visit.  Continue on current regimen  Follow up with Dr. Sherene SiresWert  In 2 months  and As needed   Please contact office for sooner follow up if symptoms do not improve or worsen or seek emergency care

## 2017-02-18 NOTE — Telephone Encounter (Signed)
Spoke with pt and informed him of MW recc. Pt agreed to the rx being called in the rx was sent to his pharmacy of choice. Nothing further is needed at this time.

## 2017-02-18 NOTE — Telephone Encounter (Signed)
Prednisone 10 mg take  4 each am x 2 days,   2 each am x 2 days,  1 each am x 2 days and stop   Ov with all meds next avail

## 2017-02-19 ENCOUNTER — Other Ambulatory Visit: Payer: Self-pay | Admitting: Family Medicine

## 2017-02-22 ENCOUNTER — Ambulatory Visit: Payer: Medicare HMO | Admitting: Internal Medicine

## 2017-02-24 ENCOUNTER — Ambulatory Visit: Payer: Medicare HMO | Admitting: Internal Medicine

## 2017-02-28 ENCOUNTER — Ambulatory Visit (INDEPENDENT_AMBULATORY_CARE_PROVIDER_SITE_OTHER): Payer: Medicare HMO | Admitting: Internal Medicine

## 2017-02-28 ENCOUNTER — Encounter: Payer: Self-pay | Admitting: Internal Medicine

## 2017-02-28 VITALS — BP 132/74 | HR 95 | Ht 63.0 in | Wt 256.0 lb

## 2017-02-28 DIAGNOSIS — R0609 Other forms of dyspnea: Secondary | ICD-10-CM

## 2017-02-28 DIAGNOSIS — J9612 Chronic respiratory failure with hypercapnia: Secondary | ICD-10-CM

## 2017-02-28 DIAGNOSIS — J9611 Chronic respiratory failure with hypoxia: Secondary | ICD-10-CM

## 2017-02-28 DIAGNOSIS — J449 Chronic obstructive pulmonary disease, unspecified: Secondary | ICD-10-CM | POA: Diagnosis not present

## 2017-02-28 MED ORDER — GABAPENTIN 300 MG PO CAPS: 300.0000 mg | ORAL_CAPSULE | Freq: Three times a day (TID) | ORAL | 2 refills | Status: DC

## 2017-02-28 NOTE — Progress Notes (Signed)
Subjective:    Patient ID: [name], male   DOB: 1951-01-05,  MRN: [id number]     HPI 66 yo quit smoking  2012   with GOLD 2 COPD    02/09/2016 Follow up : COPD Exacerbation/PNA  Pt returns for follow up for COPD exacerbation  Recent admission for COPD flare and PNA .  He was treated with abx. And diuresis  He is feeling better. Decreased cough and congestion .  CXR today shows stable basilar density c/w probable scarring.  Remains on Symbicort and Spiriva .  On O2 at 3l/m . [place] marathon works well.  Vaccines utd.  Denies chest pain, orthopnea, edema or hemoptysis.        Immunization History  Administered Date(s) Administered  . Influenza Split 10/25/2011  . Influenza Whole 09/10/2009, 09/04/2010, 09/12/2012  . Influenza,inj,Quad PF,36+ Mos 10/17/2013, 09/25/2014, 10/22/2015  . Pneumococcal Conjugate-13 01/28/2015  . Pneumococcal Polysaccharide-23 12/13/2006    Tests: PFT 12/24/09>>FEV1 1.60(57%), FEV1% 52, TLC 6.86(130%), DLCO 67%, no BD PSG with 3 liters oxygen 06/12/12>>AHI 1.2, SpO2 low 90%, PLMI 1. Echo 09/27/12>>EF 55 to 60%, mild RA dilation Echo 12/2015 EF 55-60%, Gr 1 DD       rec Continue on Symbicort and Spiriva .  Low salt diet  Continue on Oxygen 3l/m    03/11/2016 acute extended ov/[name] re:  Chief Complaint  Patient presents with  . Acute Visit    increased SOB x3 days with decreased mucus production (clear/milky), tightness, occasional wheezing.  takes 3x as long to recover from exertion.  denies any f/c/s, hemoptysis   was "better"   on 3lpm 24/7 and neb 3 x neb despite symbicort/spiriva>>  then worse x 3 days assoc with dysphagia despite taking carafate qid rec Plan A = Automatic = Symbicort 160 Take 2 puffs first thing in am and then another 2 puffs about 12 hours later.                                      Spiriva 2 pffs and Pantoprazole 40 mg Take 30-60 min before first meal of the day plus continue  zantac at  bedtime  Plan B = Backup Only use your albuterol (proair) as a rescue medication  Plan C = Crisis - only use your albuterol nebulizer if you first try Plan B and it fails to help > ok to use the nebulizer up to every 4 hours but if start needing it regularly call for immediate appointment Prednisone 10 mg take  4 each am x 2 days,   2 each am x 2 days,  1 each am x 2 days and stop  GERD  Diet     04/28/2016 acute ext ov/[name] re: GOLD II at baseline/aecopd maint rx symb 160 2bid/ spriiva respimat Chief Complaint  Patient presents with  . Acute Visit    Pt c/o increased SOB, cough with milky mucus, increased HR and decreased O2 with exertion. Pt denies CP/tightness, f/n/v. Pt has been using flutter valve daily.   back to baseline and not needing much saba at all until sob/cough worse one week prior to OV  - has futter not using  rec Plan A = Automatic = Symbicort 160 Take 2 puffs first thing in am and then another 2 puffs about 12 hours later.   Spiriva 2 pffs and Pantoprazole 40 mg Take 30-60 min before first meal of the  day plus continue  zantac at bedtime  Plan B = Backup Only use your albuterol (proair)  Plan C = Crisis - only use your albuterol nebulizer if you first try Plan B and it fails to help > ok to use the nebulizer up to every 4 hours but if start needing it regularly call for immediate appointment For cough > mucinex dm 1200 mg every 12 hours and supplement with norco if still coughing and use the flutter as much as you can zpak Prednisone 10 mg take  4 each am x 2 days,   2 each am x 2 days,  1 each am x 2 days and stop  GERD diet    08/11/2016  Acute ov/[name] re:  AECOPD/ GOLD II symb/spiriva/singulair  Chief Complaint  Patient presents with  . Acute Visit    Pt c/o increased cough and SOB for the past 2 days. He is coughing up large amounts of clear/milky sputum.    at baseline maybe one saba hfa / 02 3lpm 24/7 Already on norco 4 x daily at baseline Abrupt onset x 2  days worse sob with  Cough> milky sputum only, nothing really purulent  Lots of lifesavers but mint  Comfortable at rest p saba   rec Plan A = Automatic = Continue Symbicort 160 Take 2 puffs first thing in am and then another 2 puffs about 12 hours later. Spiriva 2 pffs and Pantoprazole 40 mg Take 30-60 min before first meal of the day plus continue  zantac at bedtime  Plan B = Backup Only use your albuterol (proair) as a rescue medication Plan C = Crisis - only use your albuterol nebulizer if you first try Plan B and it fails to help > ok to use the nebulizer up to every 4 hours but if start needing it regularly call for immediate appointment For cough > mucinex dm 1200 mg every 12 hours and supplement with norco if still coughing and use the flutter as much as you can zpak Prednisone 10 mg take  4 each am x 2 days,   2 each am x 2 days,  1 each am x 2 days and stop  GERD diet  Please remember to go to the  x-ray department downstairs for your tests - we will call you with the results when they are available.  if can't get comfortable at rest after the neb will need to go to ER    09/01/2016  Acute extended ov/[name] re: onset of flare cough /sob  [facility]  Chief Complaint  Patient presents with  . Acute Visit    Pt c/o increase in SOB x 4 days. C/o prod cough with clear to white mucus and chest tightness. Pt states his O2 has been in the 80's. Pt denies f/c/s.   did improve transiently p last ov but never back to baseline then worse x 4 days with severe hacking cough/ min productive all white mucus and increase saba over baseline. Not using flutter valve or mucinex dm as instructed bu taking norc qid for back pain    >>rx Gabapentin for cough , pred taper , CT sinus >neg     09/20/2016 NP Follow up : GOLD II COPD /O2 3 L, AR w/ positive Rast /High IgE. ., UACS   Patient returns for a two-week follow-up for recent COPD flare. He was set up for a CT sinus that showed no  evidence of acute sinusitis. He was  given a prednisone taper and started Gabapentin 100mg  Three times a day   He is starting to feel some better. Cough is decreased some.  We reviewed all his med and organized them into a med calendar with pt education .  Talked about cost of inhaler . We discussed starting patient assistance papers.  rec Follow med calendar closely and bring to each visit.  Continue on current regimen   09/21/16 [facility] eval > not tested as on antihistamines> consider low dose maint pred     02/28/2017  f/u ov/Augustina Braddock re:  Copd II/ AB with MO / 02 dep  Chief Complaint  Patient presents with  . Follow-up    Needs o2 recert. Pt reports his breathing has been worse for the past 5 days. He has also noticed wheezing.    on 02 walking 3lpm = MMRC3 = can't walk 100 yards even at a slow pace at a flat grade s stopping due to sob  Even on 02  Neb typically needed once daily after supper  Has pred as part of action plan but not following action plan   No obvious day to day or daytime variability or assoc excess/ purulent sputum or mucus plugs or hemoptysis or cp or chest tightness, subjective wheeze or overt sinus or hb symptoms. No unusual exp hx or h/o childhood pna/ asthma or knowledge of premature birth.  Sleeping ok without nocturnal  or early am exacerbation  of respiratory  c/o's or need for noct saba. Also denies any obvious fluctuation of symptoms with weather or environmental changes or other aggravating or alleviating factors except as outlined above   Current Medications, Allergies, Complete Past Medical History, Past Surgical History, Family History, and Social History were reviewed in [employer] record.  ROS  The following are not active complaints unless bolded sore throat, dysphagia, dental problems, itching, sneezing,  nasal congestion or excess/ purulent secretions, ear ache,   fever, chills, sweats, unintended wt loss, classically pleuritic  or exertional cp,  orthopnea pnd or leg swelling, presyncope, palpitations, abdominal pain, anorexia, nausea, vomiting, diarrhea  or change in bowel or bladder habits, change in stools or urine, dysuria,hematuria,  rash, arthralgias, visual complaints, headache, numbness, weakness or ataxia or problems with walking or coordination,  change in mood/affect or memory.                      Objective:   Physical Exam   obese amb wm nad     02/28/2017       256  12/27/2016        250  11/15/2016        250  10/15/16 248 lb 3.2 oz (112.6 kg)  09/21/16 251 lb 3.2 oz (113.9 kg)  09/20/16 248 lb (112.5 kg)    Vital signs reviewed - Note on arrival 02 sats  94% on 3lpm     HEENT: nl dentition, turbinates, and oropharynx. Nl external ear canals without cough reflex   NECK :  without JVD/Nodes/TM/ nl carotid upstrokes bilaterally   LUNGS: no acc muscle use,  Decreased bs in bases with very faint end exp bilateral wheeze  CV:  RRR  no s3 or murmur or increase in P2,  Trace to 1+ pitting sym lower ext edema   ABD:  Very obese/ large pannus/ soft and nontender with nl inspiratory excursion in the supine position. No bruits or organomegaly, bowel sounds nl  MS:  Nl gait/ ext warm  without deformities, calf tenderness, cyanosis or clubbing No obvious joint restrictions   SKIN: warm and dry without lesions    NEURO:  alert, approp, nl sensorium with  no motor deficits      I personally reviewed images and agree with radiology impression as follows:  CXR:   12/27/16 Mild bibasilar subsegmental atelectasis and/or scarring again noted. Chest is stable from prior exams.

## 2017-02-28 NOTE — Patient Instructions (Addendum)
See calendar for specific medication instructions and bring it back for each and every office visit for every healthcare provider you see.  Without it,  you may not receive the best quality medical care that we feel you deserve.  You will note that the calendar groups together  your maintenance  medications that are timed at particular times of the day.  Think of this as your checklist for what your doctor has instructed you to do until your next evaluation to see what benefit  there is  to staying on a consistent group of medications intended to keep you well.  The other group at the bottom is entirely up to you to use as you see fit  for specific symptoms that may arise between visits that require you to treat them on an as needed basis.  Think of this as your action plan or "what if" list.   Separating the top medications from the bottom group is fundamental to providing you adequate care going forward.     Please schedule a follow up visit in 3 months but call sooner if needed  - late add : inquire re rehbab

## 2017-03-01 ENCOUNTER — Telehealth: Payer: Self-pay | Admitting: *Deleted

## 2017-03-01 NOTE — Assessment & Plan Note (Addendum)
Body mass index is 45.35 kg/m.  trending up still and has short term pred as part of action plan Lab Results  Component Value Date   TSH 1.72 11/01/2016    Contributing to gerd risk/ doe/reviewed the need and the process to achieve and maintain neg calorie balance > defer f/u primary care including intermittently monitoring thyroid status    At this point desperately needs formal pulmonary rehab/ ? Nutrition input also

## 2017-03-01 NOTE — Assessment & Plan Note (Signed)
HCO3  11/01/16  = 38  C/w hypercarbia    As of 12/27/2016 rx = 3lpm 24/7

## 2017-03-01 NOTE — Assessment & Plan Note (Addendum)
Multifactorial but  clearly vol overloaded at present ? Component l vs r ht failure though last bnp from 12/2015 was only 86, can be falsely neg in MO > for echo per pt and rx with lasix reviewed

## 2017-03-01 NOTE — Telephone Encounter (Signed)
Spoke with the pt  I advised MW wants to refer to pulmonary rehab  He states that the issue is that he has to find transportation  He will work on this, and then call me when he is ready for me to place referral

## 2017-03-01 NOTE — Assessment & Plan Note (Signed)
Spirometry 10/15/2016  FEV1 1.41 (52%)  Ratio 61 p am symb/spiriva/saba w/in 4h - added prednisone 20 max and taper off for flares as part of action plan 10/15/2016  Continues with difficult to control airway issues and at the rec of [facility] I have added pred to action plan but pt showing very poor insight into following the plan with frequent questions/ phone calls that are already addressed on the action plan portion of the med calendar reading Right to left in a user friendly fashion  I had an extended discussion with the patient reviewing all relevant studies completed to date and  lasting 15 to 20 minutes of a 25 minute visit    Each maintenance medication was reviewed in detail including most importantly the difference between maintenance and prns and under what circumstances the prns are to be triggered using an action plan format that is not reflected in the computer generated alphabetically organized AVS but trather by a customized med calendar that reflects the AVS meds with confirmed 100% correlation.   In addition, Please see AVS for unique instructions that I personally wrote and verbalized to the the pt in detail and then reviewed with pt  by my nurse highlighting any  changes in therapy recommended at today's visit to their plan of care.

## 2017-03-01 NOTE — Telephone Encounter (Signed)
-----   Message from Nyoka CowdenMichael B Wert, MD sent at 03/01/2017  6:06 AM EDT ----- I thought we had referred him for rehab? Check to see why not going?

## 2017-03-04 ENCOUNTER — Other Ambulatory Visit: Payer: Self-pay | Admitting: Internal Medicine

## 2017-03-04 ENCOUNTER — Telehealth: Payer: Self-pay | Admitting: Internal Medicine

## 2017-03-04 NOTE — Telephone Encounter (Signed)
Please advise if okay to refill thanks 

## 2017-03-04 NOTE — Telephone Encounter (Signed)
[facility]- pt came in on Monday and 02 was requalified, but apparently there is still a problem with his order?  [facility] 02 re qualification- per [place] this was faxed to Synetta Failnita this afternoon.   Synetta Failnita please advise if you've received this form or if this needs to be re-requested.  Thanks.

## 2017-03-08 DIAGNOSIS — J449 Chronic obstructive pulmonary disease, unspecified: Secondary | ICD-10-CM | POA: Diagnosis not present

## 2017-03-08 DIAGNOSIS — I2783 Eisenmenger's syndrome: Secondary | ICD-10-CM | POA: Diagnosis not present

## 2017-03-08 DIAGNOSIS — R0902 Hypoxemia: Secondary | ICD-10-CM | POA: Diagnosis not present

## 2017-03-08 DIAGNOSIS — G479 Sleep disorder, unspecified: Secondary | ICD-10-CM | POA: Diagnosis not present

## 2017-03-08 NOTE — Telephone Encounter (Signed)
I have this form but I didn't get it until yesterday and it was faxed to the office 03/04/2017. [facility] and ask her to get Dr. Sherene SiresWert to sign today

## 2017-03-08 NOTE — Telephone Encounter (Signed)
[name] please advise if this CMN has been received and completed. thanks

## 2017-03-09 NOTE — Telephone Encounter (Signed)
MW please advise if you've signed this form.  Thanks.

## 2017-03-14 NOTE — Telephone Encounter (Signed)
This form has been signed and faxed a couple of times to [facility]. They should have the form by now

## 2017-03-23 ENCOUNTER — Other Ambulatory Visit: Payer: Self-pay | Admitting: Family Medicine

## 2017-04-08 DIAGNOSIS — J449 Chronic obstructive pulmonary disease, unspecified: Secondary | ICD-10-CM | POA: Diagnosis not present

## 2017-04-08 DIAGNOSIS — R0902 Hypoxemia: Secondary | ICD-10-CM | POA: Diagnosis not present

## 2017-04-08 DIAGNOSIS — G479 Sleep disorder, unspecified: Secondary | ICD-10-CM | POA: Diagnosis not present

## 2017-04-08 DIAGNOSIS — I2783 Eisenmenger's syndrome: Secondary | ICD-10-CM | POA: Diagnosis not present

## 2017-04-12 ENCOUNTER — Encounter: Payer: Self-pay | Admitting: Internal Medicine

## 2017-04-12 ENCOUNTER — Ambulatory Visit (INDEPENDENT_AMBULATORY_CARE_PROVIDER_SITE_OTHER): Payer: Medicare HMO | Admitting: Internal Medicine

## 2017-04-12 VITALS — BP 128/60 | HR 121 | Ht 63.0 in | Wt 256.8 lb

## 2017-04-12 DIAGNOSIS — J9611 Chronic respiratory failure with hypoxia: Secondary | ICD-10-CM | POA: Diagnosis not present

## 2017-04-12 DIAGNOSIS — L03116 Cellulitis of left lower limb: Secondary | ICD-10-CM | POA: Diagnosis not present

## 2017-04-12 DIAGNOSIS — J9612 Chronic respiratory failure with hypercapnia: Secondary | ICD-10-CM | POA: Diagnosis not present

## 2017-04-12 DIAGNOSIS — J449 Chronic obstructive pulmonary disease, unspecified: Secondary | ICD-10-CM

## 2017-04-12 MED ORDER — DOXYCYCLINE HYCLATE 100 MG PO TABS: 100.0000 mg | ORAL_TABLET | Freq: Two times a day (BID) | ORAL | 0 refills | Status: DC

## 2017-04-12 NOTE — Patient Instructions (Signed)
Doxycycline 100 mg twice daily x 10 days  Elevate the leg as much as you can above the level of your heart  Follow instructions re use of prednisone on your med calendar   Take extra furosemide if leg swelling worse than usual   See Tammy NP in 6 weeks with all your medications, even over the counter meds, separated in two separate bags, the ones you take no matter what vs the ones you stop once you feel better and take only as needed when you feel you need them.   Tammy  will generate for you a new user friendly medication calendar that will put [place] all on the same page re: your medication use.     Without this process, it simply isn't possible to assure that we are providing  your outpatient care  with  the attention to detail we feel you deserve.   If we cannot assure that you're getting that kind of care,  then we cannot manage your problem effectively from this clinic.  Once you have seen Tammy and we are sure that we're all on the same page with your medication use she will arrange follow up with me.

## 2017-04-12 NOTE — Progress Notes (Signed)
Subjective:    Patient ID: [name], male   DOB: 1951-01-05,  MRN: [id number]     HPI 66 yo quit smoking  2012   with GOLD 2 COPD    02/09/2016 Follow up : COPD Exacerbation/PNA  Pt returns for follow up for COPD exacerbation  Recent admission for COPD flare and PNA .  He was treated with abx. And diuresis  He is feeling better. Decreased cough and congestion .  CXR today shows stable basilar density c/w probable scarring.  Remains on Symbicort and Spiriva .  On O2 at 3l/m . Helios marathon works well.  Vaccines utd.  Denies chest pain, orthopnea, edema or hemoptysis.        Immunization History  Administered Date(s) Administered  . Influenza Split 10/25/2011  . Influenza Whole 09/10/2009, 09/04/2010, 09/12/2012  . Influenza,inj,Quad PF,36+ Mos 10/17/2013, 09/25/2014, 10/22/2015  . Pneumococcal Conjugate-13 01/28/2015  . Pneumococcal Polysaccharide-23 12/13/2006    Tests: PFT 12/24/09>>FEV1 1.60(57%), FEV1% 52, TLC 6.86(130%), DLCO 67%, no BD PSG with 3 liters oxygen 06/12/12>>AHI 1.2, SpO2 low 90%, PLMI 1. Echo 09/27/12>>EF 55 to 60%, mild RA dilation Echo 12/2015 EF 55-60%, Gr 1 DD       rec Continue on Symbicort and Spiriva .  Low salt diet  Continue on Oxygen 3l/m    03/11/2016 acute extended ov/[name] re:  Chief Complaint  Patient presents with  . Acute Visit    increased SOB x3 days with decreased mucus production (clear/milky), tightness, occasional wheezing.  takes 3x as long to recover from exertion.  denies any f/c/s, hemoptysis   was "better"   on 3lpm 24/7 and neb 3 x neb despite symbicort/spiriva>>  then worse x 3 days assoc with dysphagia despite taking carafate qid rec Plan A = Automatic = Symbicort 160 Take 2 puffs first thing in am and then another 2 puffs about 12 hours later.                                      Spiriva 2 pffs and Pantoprazole 40 mg Take 30-60 min before first meal of the day plus continue  zantac at  bedtime  Plan B = Backup Only use your albuterol (proair) as a rescue medication  Plan C = Crisis - only use your albuterol nebulizer if you first try Plan B and it fails to help > ok to use the nebulizer up to every 4 hours but if start needing it regularly call for immediate appointment Prednisone 10 mg take  4 each am x 2 days,   2 each am x 2 days,  1 each am x 2 days and stop  GERD  Diet     04/28/2016 acute ext ov/[name] re: GOLD II at baseline/aecopd maint rx symb 160 2bid/ spriiva respimat Chief Complaint  Patient presents with  . Acute Visit    Pt c/o increased SOB, cough with milky mucus, increased HR and decreased O2 with exertion. Pt denies CP/tightness, f/n/v. Pt has been using flutter valve daily.   back to baseline and not needing much saba at all until sob/cough worse one week prior to OV  - has futter not using  rec Plan A = Automatic = Symbicort 160 Take 2 puffs first thing in am and then another 2 puffs about 12 hours later.   Spiriva 2 pffs and Pantoprazole 40 mg Take 30-60 min before first meal of the  day plus continue  zantac at bedtime  Plan B = Backup Only use your albuterol (proair)  Plan C = Crisis - only use your albuterol nebulizer if you first try Plan B and it fails to help > ok to use the nebulizer up to every 4 hours but if start needing it regularly call for immediate appointment For cough > mucinex dm 1200 mg every 12 hours and supplement with norco if still coughing and use the flutter as much as you can zpak Prednisone 10 mg take  4 each am x 2 days,   2 each am x 2 days,  1 each am x 2 days and stop  GERD diet    08/11/2016  Acute ov/[name] re:  AECOPD/ GOLD II symb/spiriva/singulair  Chief Complaint  Patient presents with  . Acute Visit    Pt c/o increased cough and SOB for the past 2 days. He is coughing up large amounts of clear/milky sputum.    at baseline maybe one saba hfa / 02 3lpm 24/7 Already on norco 4 x daily at baseline Abrupt onset x 2  days worse sob with  Cough> milky sputum only, nothing really purulent  Lots of lifesavers but mint  Comfortable at rest p saba   rec Plan A = Automatic = Continue Symbicort 160 Take 2 puffs first thing in am and then another 2 puffs about 12 hours later. Spiriva 2 pffs and Pantoprazole 40 mg Take 30-60 min before first meal of the day plus continue  zantac at bedtime  Plan B = Backup Only use your albuterol (proair) as a rescue medication Plan C = Crisis - only use your albuterol nebulizer if you first try Plan B and it fails to help > ok to use the nebulizer up to every 4 hours but if start needing it regularly call for immediate appointment For cough > mucinex dm 1200 mg every 12 hours and supplement with norco if still coughing and use the flutter as much as you can zpak Prednisone 10 mg take  4 each am x 2 days,   2 each am x 2 days,  1 each am x 2 days and stop  GERD diet  Please remember to go to the  x-ray department downstairs for your tests - we will call you with the results when they are available.  if can't get comfortable at rest after the neb will need to go to ER    09/01/2016  Acute extended ov/[name] re: onset of flare cough /sob  early to [facility] august 2017  Chief Complaint  Patient presents with  . Acute Visit    Pt c/o increase in SOB x 4 days. C/o prod cough with clear to white mucus and chest tightness. Pt states his O2 has been in the 80's. Pt denies f/c/s.   did improve transiently p last ov but never back to baseline then worse x 4 days with severe hacking cough/ min productive all white mucus and increase saba over baseline. Not using flutter valve or mucinex dm as instructed bu taking norc qid for back pain    >>rx Gabapentin for cough , pred taper , CT sinus >neg     09/20/2016 NP Follow up : GOLD II COPD /O2 3 L, AR w/ positive Rast /High IgE. ., UACS   Patient returns for a two-week follow-up for recent COPD flare. He was set up for a CT sinus that showed no  evidence of acute sinusitis. He was  given a prednisone taper and started Gabapentin  Three times a day   He is starting to feel some better. Cough is decreased some.  We reviewed all his med and organized them into a med calendar with pt education .  Talked about cost of inhaler . We discussed starting patient assistance papers.  rec Follow med calendar closely and bring to each visit.  Continue on current regimen   09/21/16 Kozlow eval > not tested as on antihistamines> consider low dose maint pred     02/28/2017  f/u ov/Jarika Robben re:  Copd II/ AB with MO / 02 dep  Chief Complaint  Patient presents with  . Follow-up    Needs o2 recert. Pt reports his breathing has been worse for the past 5 days. He has also noticed wheezing.    on 02 walking 3lpm = MMRC3 = can't walk 100 yards even at a slow pace at a flat grade s stopping due to sob  Even on 02 3lpm Neb typically needed once daily after supper  Has pred as part of action plan but not following action plan rec Follow med calendar   04/12/2017  f/u ov/Sayward Horvath re:  Copd II/ AB with MO and 02 dep 3lpm 24/7 maint rx symb/spirva with very good hfa/smi Chief Complaint  Patient presents with  . Follow-up    Pt feels like he is having increase sob with exertion, feels like he is getting sob faster, coughing but unable to produce anything, has some chest congestion, Last dose of prednisone was friday  prednisone worked after only a few doses but finished last dose 04/08/17 and did not follow action plan re restart prn increase sob or need for saba  No obvious day to day or daytime variability or assoc excess/ purulent sputum or mucus plugs or hemoptysis or cp or chest tightness, subjective wheeze or overt sinus or hb symptoms. No unusual exp hx or h/o childhood pna/ asthma or knowledge of premature birth.  Sleeping ok without nocturnal  or early am exacerbation  of respiratory  c/o's or need for noct saba. Also denies any obvious fluctuation of  symptoms with weather or environmental changes or other aggravating or alleviating factors except as outlined above   Current Medications, Allergies, Complete Past Medical History, Past Surgical History, Family History, and Social History were reviewed in [employer] record.  ROS  The following are not active complaints unless bolded sore throat, dysphagia, dental problems, itching, sneezing,  nasal congestion or excess/ purulent secretions, ear ache,   fever, chills, sweats, unintended wt loss, classically pleuritic or exertional cp,  orthopnea pnd or leg swelling, presyncope, palpitations, abdominal pain, anorexia, nausea, vomiting, diarrhea  or change in bowel or bladder habits, change in stools or urine, dysuria,hematuria,  rash, arthralgias, visual complaints, headache, numbness, weakness or ataxia or problems with walking or coordination,  change in mood/affect or memory.                          Objective:   Physical Exam   obese amb wm nad    04/12/2017         257  02/28/2017       256  12/27/2016        250  11/15/2016        250  10/15/16 248 lb 3.2 oz (112.6 kg)  09/21/16 251 lb 3.2 oz (113.9 kg)  09/20/16 248 lb (112.5 kg)  Vital signs reviewed - Note on arrival 02 sats  90 % on 3lpm     HEENT: nl dentition, turbinates, and oropharynx. Nl external ear canals without cough reflex   NECK :  without JVD/Nodes/TM/ nl carotid upstrokes bilaterally   LUNGS: no acc muscle use,  Decreased bs in both bases with very faint end exp bilateral wheeze  CV:  RRR  no s3 or murmur or increase in P2,  Trace to 1+ pitting sym = lower ext edema   ABD:  Tensely  obese/ large pannus/ soft and nontender with nl inspiratory excursion in the supine position. No bruits or organomegaly, bowel sounds nl  MS:  Nl gait/ ext warm without deformities, calf tenderness, cyanosis or clubbing No obvious joint restrictions   SKIN: warm and dry with single dime sized  ulceration L ant shin with surrounding erythema  10 x 5 cm  NEURO:  alert, approp, nl sensorium with  no motor deficits      I personally reviewed images and agree with radiology impression as follows:  CXR:   12/27/16 Mild bibasilar subsegmental atelectasis and/or scarring again noted. Chest is stable from prior exams.

## 2017-04-13 DIAGNOSIS — L03116 Cellulitis of left lower limb: Secondary | ICD-10-CM | POA: Insufficient documentation

## 2017-04-13 NOTE — Assessment & Plan Note (Signed)
rx doxy x 10 days 04/12/2017 >>>  Also rec elevation/ neosporin / Follow up per Primary Care planned

## 2017-04-13 NOTE — Assessment & Plan Note (Signed)
Spirometry 10/15/2016  FEV1 1.41 (52%)  Ratio 61 p am symb/spiriva/saba w/in 4h - added prednisone 20 max and taper off for flares as part of action plan 10/15/2016   04/12/2017  After extensive coaching HFA effectiveness =    90% from a baseline of 90%   Continues with symptoms that are very difficult to control I  Suspect due to the restrictive baseline from his obesity, not much reserve at all to handle the ab component and so far refuses to go to rehab  Shows very poor insight into following the action plan in front of him reflected on the  Med calendar he brought with him   I had an extended discussion with the patient reviewing all relevant studies completed to date and  lasting 15 to 20 minutes of a 25 minute visit    Each maintenance medication was reviewed in detail including most importantly the difference between maintenance and prns and under what circumstances the prns are to be triggered using an action plan format that is not reflected in the computer generated alphabetically organized AVS but trather by a customized med calendar with red marks highlighting the parts he's not doing correctly  that reflects the AVS meds with confirmed 100% correlation.   In addition, Please see AVS for unique instructions that I personally wrote and verbalized to the the pt in detail and then reviewed with pt  by my nurse highlighting any  changes in therapy recommended at today's visit to their plan of care.

## 2017-04-13 NOTE — Assessment & Plan Note (Signed)
Body mass index is 45.49 kg/m.  -  trending up slightly  Lab Results  Component Value Date   TSH 1.72 11/01/2016     Contributing to gerd risk/ doe/reviewed the need and the process to achieve and maintain neg calorie balance > defer f/u primary care including intermittently monitoring thyroid status

## 2017-04-13 NOTE — Assessment & Plan Note (Signed)
HCO3  11/01/16  = 38  C/w hypercarbia    As of 04/12/2017 rx = 3lpm 24/7  With goal low 90's in the setting of hypercarbia

## 2017-04-15 ENCOUNTER — Ambulatory Visit: Payer: Medicare Other | Admitting: Family Medicine

## 2017-04-19 ENCOUNTER — Encounter: Payer: Self-pay | Admitting: Family Medicine

## 2017-04-19 ENCOUNTER — Ambulatory Visit (INDEPENDENT_AMBULATORY_CARE_PROVIDER_SITE_OTHER): Payer: Medicare HMO | Admitting: Family Medicine

## 2017-04-19 VITALS — BP 110/60 | HR 111 | Temp 98.2°F | Resp 16 | Wt 254.0 lb

## 2017-04-19 DIAGNOSIS — L03116 Cellulitis of left lower limb: Secondary | ICD-10-CM

## 2017-04-19 DIAGNOSIS — T148XXA Other injury of unspecified body region, initial encounter: Secondary | ICD-10-CM

## 2017-04-19 MED ORDER — SULFAMETHOXAZOLE-TRIMETHOPRIM 800-160 MG PO TABS: 1.0000 | ORAL_TABLET | Freq: Two times a day (BID) | ORAL | 0 refills | Status: DC
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
## 2017-05-01 ENCOUNTER — Other Ambulatory Visit: Payer: Self-pay | Admitting: Internal Medicine

## 2017-05-04 ENCOUNTER — Other Ambulatory Visit: Payer: Self-pay | Admitting: Family Medicine

## 2017-05-04 DIAGNOSIS — M25512 Pain in left shoulder: Secondary | ICD-10-CM | POA: Diagnosis not present

## 2017-05-04 DIAGNOSIS — M25511 Pain in right shoulder: Secondary | ICD-10-CM | POA: Diagnosis not present

## 2017-05-04 DIAGNOSIS — G894 Chronic pain syndrome: Secondary | ICD-10-CM | POA: Diagnosis not present

## 2017-05-04 DIAGNOSIS — G8929 Other chronic pain: Secondary | ICD-10-CM | POA: Diagnosis not present

## 2017-05-08 DIAGNOSIS — G479 Sleep disorder, unspecified: Secondary | ICD-10-CM | POA: Diagnosis not present

## 2017-05-08 DIAGNOSIS — I2783 Eisenmenger's syndrome: Secondary | ICD-10-CM | POA: Diagnosis not present

## 2017-05-08 DIAGNOSIS — J449 Chronic obstructive pulmonary disease, unspecified: Secondary | ICD-10-CM | POA: Diagnosis not present

## 2017-05-08 DIAGNOSIS — R0902 Hypoxemia: Secondary | ICD-10-CM | POA: Diagnosis not present

## 2017-05-16 ENCOUNTER — Other Ambulatory Visit: Payer: Self-pay | Admitting: Internal Medicine

## 2017-05-19 ENCOUNTER — Other Ambulatory Visit: Payer: Self-pay | Admitting: Internal Medicine

## 2017-05-27 ENCOUNTER — Ambulatory Visit (INDEPENDENT_AMBULATORY_CARE_PROVIDER_SITE_OTHER)
Admission: RE | Admit: 2017-05-27 | Discharge: 2017-05-27 | Disposition: A | Discharge status: Home / Self Care | Payer: Medicare HMO | Source: Ambulatory Visit | Attending: Adult Health | Admitting: Adult Health

## 2017-05-27 ENCOUNTER — Other Ambulatory Visit: Payer: Self-pay | Admitting: *Deleted

## 2017-05-27 ENCOUNTER — Encounter: Payer: Self-pay | Admitting: Adult Health

## 2017-05-27 ENCOUNTER — Ambulatory Visit (INDEPENDENT_AMBULATORY_CARE_PROVIDER_SITE_OTHER): Payer: Medicare HMO | Admitting: Adult Health

## 2017-05-27 ENCOUNTER — Other Ambulatory Visit (INDEPENDENT_AMBULATORY_CARE_PROVIDER_SITE_OTHER): Payer: Medicare HMO

## 2017-05-27 VITALS — BP 128/74 | HR 103 | Ht 64.0 in | Wt 257.6 lb

## 2017-05-27 DIAGNOSIS — J449 Chronic obstructive pulmonary disease, unspecified: Secondary | ICD-10-CM | POA: Diagnosis not present

## 2017-05-27 DIAGNOSIS — I5032 Chronic diastolic (congestive) heart failure: Secondary | ICD-10-CM

## 2017-05-27 DIAGNOSIS — R0689 Other abnormalities of breathing: Secondary | ICD-10-CM

## 2017-05-27 DIAGNOSIS — J9611 Chronic respiratory failure with hypoxia: Secondary | ICD-10-CM | POA: Diagnosis not present

## 2017-05-27 DIAGNOSIS — R0602 Shortness of breath: Secondary | ICD-10-CM | POA: Diagnosis not present

## 2017-05-27 LAB — CBC WITH DIFFERENTIAL/PLATELET
BASOS ABS: 0.1 10*3/uL (ref 0.0–0.1)
Basophils Relative: 0.7 % (ref 0.0–3.0)
Eosinophils Absolute: 0.1 10*3/uL (ref 0.0–0.7)
Eosinophils Relative: 1.6 % (ref 0.0–5.0)
HCT: 39.8 % (ref 39.0–52.0)
Hemoglobin: 13 g/dL (ref 13.0–17.0)
LYMPHS ABS: 1.6 10*3/uL (ref 0.7–4.0)
Lymphocytes Relative: 17.3 % (ref 12.0–46.0)
MCHC: 32.6 g/dL (ref 30.0–36.0)
MCV: 92.7 fl (ref 78.0–100.0)
MONO ABS: 0.7 10*3/uL (ref 0.1–1.0)
MONOS PCT: 7.7 % (ref 3.0–12.0)
NEUTROS ABS: 6.6 10*3/uL (ref 1.4–7.7)
NEUTROS PCT: 72.7 % (ref 43.0–77.0)
PLATELETS: 175 10*3/uL (ref 150.0–400.0)
RBC: 4.29 Mil/uL (ref 4.22–5.81)
RDW: 14.3 % (ref 11.5–15.5)
WBC: 9.1 10*3/uL (ref 4.0–10.5)

## 2017-05-27 LAB — BASIC METABOLIC PANEL
BUN: 12 mg/dL (ref 6–23)
CALCIUM: 9.7 mg/dL (ref 8.4–10.5)
CHLORIDE: 98 meq/L (ref 96–112)
CO2: 39 meq/L — AB (ref 19–32)
Creatinine, Ser: 0.77 mg/dL (ref 0.40–1.50)
GFR: 107.85 mL/min (ref >60.00)
Glucose, Bld: 118 mg/dL — ABNORMAL HIGH (ref 70–99)
Potassium: 3.6 mEq/L (ref 3.5–5.1)
SODIUM: 142 meq/L (ref 135–145)

## 2017-05-27 LAB — BRAIN NATRIURETIC PEPTIDE: PRO B NATRI PEPTIDE: 28 pg/mL (ref 0.0–100.0)

## 2017-05-27 NOTE — Patient Instructions (Addendum)
Labs and Chest xray today  Begin Prednisone 20mg  daily for 5 days then 10mg  daily for 5 days  And then stop  Legs elevated, TED hose.  Continue on Lasix .  Low salt diet  Increase Oxygen 4l/m continuous flow .  Follow up with Dr. Sherene SiresWert  In 4-6 weeks and As needed   Please contact office for sooner follow up if symptoms do not improve or worsen or seek emergency care

## 2017-05-27 NOTE — Assessment & Plan Note (Signed)
Mild flare - cont on high dose diureiss  Check labs Restart TED  Low salt diet

## 2017-05-27 NOTE — Assessment & Plan Note (Signed)
Mild flare , restart prednisone taper.  Check labs with cxr , cbc, bmet , bnp and d dimer   Plan  Patient Instructions  Labs and Chest xray today  Begin Prednisone 20mg  daily for 5 days then 10mg  daily for 5 days  And then stop  Legs elevated, TED hose.  Continue on Lasix .  Low salt diet  Increase Oxygen 4l/m continuous flow .  Follow up with Dr. Sherene SiresWert  In 4-6 weeks and As needed   Please contact office for sooner follow up if symptoms do not improve or worsen or seek emergency care
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
## 2017-05-27 NOTE — Assessment & Plan Note (Signed)
Increased O2 4l/m to keep sats >90%.

## 2017-05-28 LAB — D-DIMER, QUANTITATIVE (NOT AT ARMC): D DIMER QUANT: 0.65 ug{FEU}/mL — AB (ref <0.50)

## 2017-05-30 NOTE — Progress Notes (Signed)
LMOMTCB x 1 

## 2017-05-30 NOTE — Progress Notes (Signed)
LMOM TCB x2

## 2017-05-31 ENCOUNTER — Ambulatory Visit (INDEPENDENT_AMBULATORY_CARE_PROVIDER_SITE_OTHER)
Admission: RE | Admit: 2017-05-31 | Discharge: 2017-05-31 | Disposition: A | Discharge status: Home / Self Care | Payer: Medicare HMO | Source: Ambulatory Visit | Attending: Adult Health | Admitting: Adult Health

## 2017-05-31 ENCOUNTER — Other Ambulatory Visit: Payer: Self-pay | Admitting: Adult Health

## 2017-05-31 DIAGNOSIS — R0602 Shortness of breath: Secondary | ICD-10-CM

## 2017-05-31 DIAGNOSIS — R7989 Other specified abnormal findings of blood chemistry: Secondary | ICD-10-CM | POA: Diagnosis not present

## 2017-05-31 MED ORDER — IOPAMIDOL (ISOVUE-370) INJECTION 76%
100.0000 mL | Freq: Once | INTRAVENOUS | Status: AC | PRN
  Administered 2017-05-31: 80 mL via INTRAVENOUS

## 2017-06-01 ENCOUNTER — Telehealth: Payer: Self-pay | Admitting: Internal Medicine

## 2017-06-01 NOTE — Telephone Encounter (Signed)
Notes recorded by Julio SicksParrett, Tammy S, NP on 05/31/2017 at 5:10 PM EDT Diffuse emphysema . No PE  Cont w/ ov recs Please contact office for sooner follow up if symptoms do not improve or worsen or seek emergency care. --------------------------------------- Spoke with pt. He is aware of his results. Nothing further was needed.

## 2017-06-03 ENCOUNTER — Telehealth: Payer: Self-pay | Admitting: Internal Medicine

## 2017-06-03 NOTE — Telephone Encounter (Signed)
Spoke with the pt and notified of recs per MW and he verbalized understanding  Nothing further needed 

## 2017-06-03 NOTE — Telephone Encounter (Signed)
Increase back to 20 until better then back to 10 mg daily until seen

## 2017-06-03 NOTE — Telephone Encounter (Signed)
Spoke with pt. States that he was instructed at his last OV to decrease his prednisone. TP advised him to take 20mg  for 5 days then 10mg  for 5 days. Pt currently taking 10mg  daily. Reports increased coughing, SOB and wheezing. Cough is producing yellow mucus. He would like MW's recommendations.  MW - please advise. Thanks.

## 2017-06-06 ENCOUNTER — Ambulatory Visit (HOSPITAL_COMMUNITY)
Admission: RE | Admit: 2017-06-06 | Discharge: 2017-06-06 | Disposition: A | Discharge status: Home / Self Care | Payer: Medicare HMO | Source: Ambulatory Visit | Attending: Adult Health | Admitting: Adult Health

## 2017-06-06 ENCOUNTER — Ambulatory Visit (INDEPENDENT_AMBULATORY_CARE_PROVIDER_SITE_OTHER): Payer: Medicare HMO | Admitting: Family Medicine

## 2017-06-06 ENCOUNTER — Encounter: Payer: Self-pay | Admitting: Family Medicine

## 2017-06-06 VITALS — BP 130/58 | HR 104 | Temp 98.7°F | Resp 24 | Ht 63.0 in | Wt 254.0 lb

## 2017-06-06 DIAGNOSIS — I5032 Chronic diastolic (congestive) heart failure: Secondary | ICD-10-CM | POA: Diagnosis not present

## 2017-06-06 DIAGNOSIS — R0689 Other abnormalities of breathing: Secondary | ICD-10-CM | POA: Diagnosis not present

## 2017-06-06 DIAGNOSIS — I872 Venous insufficiency (chronic) (peripheral): Secondary | ICD-10-CM | POA: Diagnosis not present

## 2017-06-06 DIAGNOSIS — I499 Cardiac arrhythmia, unspecified: Secondary | ICD-10-CM | POA: Diagnosis not present

## 2017-06-06 DIAGNOSIS — L97829 Non-pressure chronic ulcer of other part of left lower leg with unspecified severity: Secondary | ICD-10-CM

## 2017-06-06 LAB — BLOOD GAS, ARTERIAL
Acid-Base Excess: 7.4 mmol/L — ABNORMAL HIGH (ref 0.0–2.0)
Bicarbonate: 32.8 mmol/L — ABNORMAL HIGH (ref 20.0–28.0)
Drawn by: 244901
O2 Content: 4 L/min
O2 Saturation: 91.1 %
PATIENT TEMPERATURE: 98.6
PCO2 ART: 51.4 mmHg — AB (ref 32.0–48.0)
PH ART: 7.421 (ref 7.350–7.450)
PO2 ART: 62.8 mmHg — AB (ref 83.0–108.0)

## 2017-06-06 NOTE — Patient Instructions (Signed)
F/U Friday recheck wound

## 2017-06-06 NOTE — Progress Notes (Signed)
   Subjective:    Patient ID: [name], male    DOB: 09-10-1951, 66 y.o.   MRN: [id number]  Patient presents for Lesion on left leg - non healing   Pt here with non healing left leg wound. This has been present for past 3-4 months, he has been treated with antibiotics twice, last in May with doxycycline and then bactrim. He has been wearing compression hose for his chroic swelling but wound will not heal completely. It tends to open back up when his leg swelling goes up    He was seen by pulmonary last week due to increased WOB, oxygen was increased to 4L  Labs done showed elevated d dimer however CT angio showed severe emphysema but no PE or PNA. Non fasting blood sugar was 118, CBC and BNP were normal  Echo from 2017- EF 55-60%, grade 1 diastolic dysfunction      Review Of Systems:  GEN- denies fatigue, fever, weight loss,weakness, recent illness HEENT- denies eye drainage, change in vision, nasal discharge, CVS- denies chest pain, palpitations RESP- +SOB, cough, wheeze ABD- denies N/V, change in stools, abd pain GU- denies dysuria, hematuria, dribbling, incontinence MSK- denies joint pain, muscle aches, injury Neuro- denies headache, dizziness, syncope, seizure activity       Objective:    BP (!) 130/58   Pulse (!) 104   Temp 98.7 F (37.1 C) (Oral)   Resp (!) 24   Ht 5\' 3"  (1.6 m)   Wt 254 lb (115.2 kg)   SpO2 95%   BMI 44.99 kg/m  GEN- NAD, alert and oriented x3 HEENT- PERRL, EOMI, non injected sclera, pink conjunctiva, MMM, oropharynx clear Neck- Supple, no thyromegaly CVS- RRR, no murmur RESP-CTAB ABD-NABS,soft,NT,ND EXT- left leg 1.5x 0.5" wound with mild yellow drainage, granulation tissue at center, 1+ pitting edema bilat on shin Pulses- Radial, DP- 2+  EKG- Sinus Rhythm HR 96 , PAC, atrial enlargement, reviewed EKG Jan 2017      Assessment & Plan:      Problem List Items Addressed This Visit    Chronic diastolic heart failure (HCC)    Other  Visit Diagnoses    Irregular heart beat    -  Primary   PAC improved when oxygen was increased to 4L whcih is his baseline at home. Reviewed previous EKG and Echo, diastolic heart failure.continue lasix   Relevant Orders   EKG 12-Lead (Completed)   EKG 12-Lead (Completed)   Venous stasis ulcer of other part of left lower leg without varicose veins, unspecified ulcer stage (HCC)       significant stasis edema and ulcer which wont heal, Unna Boot placed, return Friday for removal      Note: This dictation was prepared with Lennar CorporationDragon dictation along with smaller Lobbyistphrase technology. Any transcriptional errors that result from this process are unintentional.

## 2017-06-07 ENCOUNTER — Telehealth: Payer: Self-pay | Admitting: Internal Medicine

## 2017-06-07 DIAGNOSIS — J9611 Chronic respiratory failure with hypoxia: Secondary | ICD-10-CM

## 2017-06-07 NOTE — Telephone Encounter (Signed)
Spoke with patient. He is aware of results.   He stated that [facility]. The oxygen tanks he has now are running out quicker with 4L. He wants to speak with someone in referrals about getting tanks that are bigger enough to last him more than 3 hours. Advised patient that we would have to place an [facility] or another company to get him the oxygen he needs.   PCCs, can you offer some advice to him? Thanks.

## 2017-06-07 NOTE — Telephone Encounter (Signed)
abg is perfect considering his dz on 4lpm so no change in recs

## 2017-06-07 NOTE — Telephone Encounter (Signed)
Patient returning call for results - he can be reached at [phone] -pr

## 2017-06-07 NOTE — Telephone Encounter (Signed)
Spoke with, who is requesting blood gas results from 06/06/17. Lab was ordered by TP during 05/27/17 OV. Will send to MW to result, as TP is on vacation. Pt also states during his OV on 6/20, TP increased O2 to 4L. Pt states his tanks are not lasting long at this liter flow and he only has three other tanks. Pt has [facility] me with the [facility], as it does not appear that our office has sent an order to this company previously. I have [facility] with [facility] to clarify that they provide pt with O2. [facility] statets they only provide pt with liquid O2 and [facility] provide equipment. Pt states tank last about 2 hours on 3L-4L. Pt states he takes one extra tank with him when he goes out. I explained that is typically what our pt's do. Pt states he will reach out to his DME for advice as well.  MW please advise on blood gas results. Thanks.

## 2017-06-08 DIAGNOSIS — J449 Chronic obstructive pulmonary disease, unspecified: Secondary | ICD-10-CM | POA: Diagnosis not present

## 2017-06-08 DIAGNOSIS — G479 Sleep disorder, unspecified: Secondary | ICD-10-CM | POA: Diagnosis not present

## 2017-06-08 DIAGNOSIS — R0902 Hypoxemia: Secondary | ICD-10-CM | POA: Diagnosis not present

## 2017-06-08 DIAGNOSIS — I2783 Eisenmenger's syndrome: Secondary | ICD-10-CM | POA: Diagnosis not present

## 2017-06-08 NOTE — Telephone Encounter (Signed)
We do not know anything about a 02 company [facility] I guess pt should give us the info on this company so we can send an order to them for bigger or more tanks or whatever pt may need for his 02 needs and then you will need to place an order Tobe SosSally E Ottinger

## 2017-06-08 NOTE — Telephone Encounter (Signed)
MW  Please Advise (please see previous message)-  Pt called and stated that his oxygen tanks are running out quicker on 4L. He wanted to know if we could place an order for bigger tanks that will last him more than 3 hours.    [facility]I to [facility]: [facility] at [facility] they stated because pt's has liquid oxygen he gets it through [facility] phone number [phone]

## 2017-06-08 NOTE — Telephone Encounter (Signed)
lmomtcb x 2 for the pt.  

## 2017-06-08 NOTE — Telephone Encounter (Signed)
Spoke with pt and he is aware that MW agreed to the order and that this was placed. He had no further questions at this time. Nothing further is needed

## 2017-06-08 NOTE — Telephone Encounter (Signed)
Patient returned call, CB is [phone]

## 2017-06-08 NOTE — Telephone Encounter (Signed)
Fine with me

## 2017-06-08 NOTE — Telephone Encounter (Signed)
Order placed to [facility] to provide larger liquid O2 tanks. lmtcb X1 for pt to make aware of order.  Thanks.

## 2017-06-10 ENCOUNTER — Encounter: Payer: Self-pay | Admitting: Family Medicine

## 2017-06-10 ENCOUNTER — Ambulatory Visit (INDEPENDENT_AMBULATORY_CARE_PROVIDER_SITE_OTHER): Payer: Medicare HMO | Admitting: Family Medicine

## 2017-06-10 DIAGNOSIS — L97929 Non-pressure chronic ulcer of unspecified part of left lower leg with unspecified severity: Secondary | ICD-10-CM

## 2017-06-10 DIAGNOSIS — L97919 Non-pressure chronic ulcer of unspecified part of right lower leg with unspecified severity: Secondary | ICD-10-CM | POA: Diagnosis not present

## 2017-06-10 DIAGNOSIS — I872 Venous insufficiency (chronic) (peripheral): Secondary | ICD-10-CM

## 2017-06-10 NOTE — Patient Instructions (Addendum)
F/U Tuesday July 3rd Dr. Tanya NonesPickard at 2:30pm Samuel CombsUnna Boot will be removed

## 2017-06-10 NOTE — Progress Notes (Signed)
   Subjective:    Patient ID: [name], male    DOB: May 07, 1951, 66 y.o.   MRN: [id number]  Patient presents for Follow-up  She here for interim follow-up. He was seen earlier this week and abuse were applied to his left leg for his venous stasis ulcer. Today he is here for removal. He is not having difficulties with the Foot LockerUnna boot. He's also been wearing his regular compression hose on his right leg to help with his fluid retention and taken his diuretics.   Review Of Systems:  GEN- denies fatigue, fever, weight loss,weakness, recent illness HEENT- denies eye drainage, change in vision, nasal discharge, CVS- denies chest pain, palpitations RESP- denies SOB, cough, wheezey        Objective:    BP 138/62   Pulse (!) 110   Temp 98.6 F (37 C) (Oral)   Resp 14   Ht 5\' 3"  (1.6 m)   Wt 255 lb (115.7 kg)   SpO2 96% Comment: 4L/min  BMI 45.17 kg/m  GEN- NAD, alert and oriented x3 CVS- RRR, no murmur RESP-CTAB EXT- left leg 1.0x 0.3"  wound with mild yellow drainage, granulation tissue at center, mild edema bilat  Pulses- Radial, DP- 2+        Assessment & Plan:      Problem List Items Addressed This Visit    Venous stasis ulcer of both lower extremities without varicose veins (HCC)    Improved healing of ulcer, size had decreased and fluid retention decreasing. will place a 2nd unna boot F/U next week for removal         Note: This dictation was prepared with Dragon dictation along with smaller phrase technology. Any transcriptional errors that result from this process are unintentional.

## 2017-06-12 ENCOUNTER — Other Ambulatory Visit: Payer: Self-pay | Admitting: Family Medicine

## 2017-06-13 ENCOUNTER — Encounter: Payer: Self-pay | Admitting: Family Medicine

## 2017-06-13 NOTE — Assessment & Plan Note (Signed)
Improved healing of ulcer, size had decreased and fluid retention decreasing. will place a 2nd unna boot F/U next week for removal

## 2017-06-14 ENCOUNTER — Ambulatory Visit: Payer: Medicare HMO | Admitting: Family Medicine

## 2017-06-14 ENCOUNTER — Ambulatory Visit (INDEPENDENT_AMBULATORY_CARE_PROVIDER_SITE_OTHER): Payer: Medicare HMO | Admitting: Family Medicine

## 2017-06-14 ENCOUNTER — Encounter: Payer: Self-pay | Admitting: Family Medicine

## 2017-06-14 VITALS — BP 130/68 | HR 115 | Temp 98.7°F | Resp 22 | Ht 63.0 in | Wt 253.0 lb

## 2017-06-14 DIAGNOSIS — I872 Venous insufficiency (chronic) (peripheral): Secondary | ICD-10-CM | POA: Diagnosis not present

## 2017-06-14 DIAGNOSIS — L97929 Non-pressure chronic ulcer of unspecified part of left lower leg with unspecified severity: Secondary | ICD-10-CM | POA: Diagnosis not present

## 2017-06-14 DIAGNOSIS — L97919 Non-pressure chronic ulcer of unspecified part of right lower leg with unspecified severity: Secondary | ICD-10-CM | POA: Diagnosis not present
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
## 2017-06-20 ENCOUNTER — Ambulatory Visit (INDEPENDENT_AMBULATORY_CARE_PROVIDER_SITE_OTHER): Payer: Medicare HMO | Admitting: Family Medicine

## 2017-06-20 ENCOUNTER — Encounter: Payer: Self-pay | Admitting: Family Medicine

## 2017-06-20 VITALS — BP 148/82 | HR 116 | Temp 98.8°F | Resp 14 | Ht 63.0 in | Wt 257.0 lb

## 2017-06-20 DIAGNOSIS — I872 Venous insufficiency (chronic) (peripheral): Secondary | ICD-10-CM | POA: Diagnosis not present

## 2017-06-20 DIAGNOSIS — L97929 Non-pressure chronic ulcer of unspecified part of left lower leg with unspecified severity: Secondary | ICD-10-CM | POA: Diagnosis not present

## 2017-06-20 DIAGNOSIS — L97919 Non-pressure chronic ulcer of unspecified part of right lower leg with unspecified severity: Secondary | ICD-10-CM
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
## 2017-06-24 ENCOUNTER — Ambulatory Visit: Payer: Medicare HMO | Admitting: Family Medicine

## 2017-06-27 ENCOUNTER — Encounter: Payer: Self-pay | Admitting: Family Medicine

## 2017-06-27 ENCOUNTER — Ambulatory Visit (INDEPENDENT_AMBULATORY_CARE_PROVIDER_SITE_OTHER): Payer: Medicare HMO | Admitting: Family Medicine

## 2017-06-27 VITALS — BP 100/50 | HR 92 | Temp 98.2°F | Resp 24 | Ht 63.0 in | Wt 252.0 lb

## 2017-06-27 DIAGNOSIS — L97929 Non-pressure chronic ulcer of unspecified part of left lower leg with unspecified severity: Secondary | ICD-10-CM | POA: Diagnosis not present

## 2017-06-27 DIAGNOSIS — L97919 Non-pressure chronic ulcer of unspecified part of right lower leg with unspecified severity: Secondary | ICD-10-CM

## 2017-06-27 DIAGNOSIS — I872 Venous insufficiency (chronic) (peripheral): Secondary | ICD-10-CM
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
## 2017-06-29 DIAGNOSIS — I2783 Eisenmenger's syndrome: Secondary | ICD-10-CM | POA: Diagnosis not present

## 2017-06-29 DIAGNOSIS — G479 Sleep disorder, unspecified: Secondary | ICD-10-CM | POA: Diagnosis not present

## 2017-06-29 DIAGNOSIS — R0902 Hypoxemia: Secondary | ICD-10-CM | POA: Diagnosis not present

## 2017-06-29 DIAGNOSIS — J449 Chronic obstructive pulmonary disease, unspecified: Secondary | ICD-10-CM | POA: Diagnosis not present

## 2017-07-01 ENCOUNTER — Encounter: Payer: Self-pay | Admitting: Family Medicine

## 2017-07-01 ENCOUNTER — Ambulatory Visit (INDEPENDENT_AMBULATORY_CARE_PROVIDER_SITE_OTHER): Payer: Medicare HMO | Admitting: Family Medicine

## 2017-07-01 VITALS — BP 120/64 | HR 98 | Temp 98.4°F | Resp 22 | Ht 63.0 in | Wt 254.0 lb

## 2017-07-01 DIAGNOSIS — I872 Venous insufficiency (chronic) (peripheral): Secondary | ICD-10-CM

## 2017-07-01 DIAGNOSIS — L97919 Non-pressure chronic ulcer of unspecified part of right lower leg with unspecified severity: Secondary | ICD-10-CM

## 2017-07-01 DIAGNOSIS — L97929 Non-pressure chronic ulcer of unspecified part of left lower leg with unspecified severity: Secondary | ICD-10-CM
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
## 2017-07-04 ENCOUNTER — Encounter: Payer: Self-pay | Admitting: Internal Medicine

## 2017-07-04 ENCOUNTER — Ambulatory Visit (INDEPENDENT_AMBULATORY_CARE_PROVIDER_SITE_OTHER): Payer: Medicare HMO | Admitting: Internal Medicine

## 2017-07-04 VITALS — BP 104/68 | HR 103 | Ht 63.0 in | Wt 253.0 lb

## 2017-07-04 DIAGNOSIS — R05 Cough: Secondary | ICD-10-CM | POA: Diagnosis not present

## 2017-07-04 DIAGNOSIS — J9612 Chronic respiratory failure with hypercapnia: Secondary | ICD-10-CM

## 2017-07-04 DIAGNOSIS — R058 Other specified cough: Secondary | ICD-10-CM

## 2017-07-04 DIAGNOSIS — J9611 Chronic respiratory failure with hypoxia: Secondary | ICD-10-CM

## 2017-07-04 DIAGNOSIS — J449 Chronic obstructive pulmonary disease, unspecified: Secondary | ICD-10-CM | POA: Diagnosis not present

## 2017-07-04 NOTE — Assessment & Plan Note (Signed)
Body mass index is 44.82 kg/m.  -  trending down slightly  Lab Results  Component Value Date   TSH 1.72 11/01/2016     Contributing to gerd risk/ doe/reviewed the need and the process to achieve and maintain neg calorie balance > defer f/u primary care including intermittently monitoring thyroid status

## 2017-07-04 NOTE — Patient Instructions (Addendum)
Prednisone 10 mg 2 daily until better then alternate 2 with 1 (even vs odd days)  Then after a week 1 daily   Remember to purse lips when you breathe  out    See calendar for specific medication instructions and bring it back for each and every office visit for every healthcare provider you see.  Without it,  you may not receive the best quality medical care that we feel you deserve.  You will note that the calendar groups together  your maintenance  medications that are timed at particular times of the day.  Think of this as your checklist for what your doctor has instructed you to do until your next evaluation to see what benefit  there is  to staying on a consistent group of medications intended to keep you well.  The other group at the bottom is entirely up to you to use as you see fit  for specific symptoms that may arise between visits that require you to treat them on an as needed basis.  Think of this as your action plan or "what if" list.   Separating the top medications from the bottom group is fundamental to providing you adequate care going forward.     Please schedule a follow up visit in 3 months but call sooner if needed to See Tammy NP on return

## 2017-07-04 NOTE — Assessment & Plan Note (Signed)
HCO3  05/27/17 11/01/16  = 39 C/w hypercarbia    As of 07/04/2017 rx =   4lpm

## 2017-07-04 NOTE — Progress Notes (Signed)
Subjective:    Patient ID: [name], male   DOB: 1951-01-05,  MRN: [id number]     HPI 66 yo quit smoking  2012   with GOLD 2 COPD    02/09/2016 Follow up : COPD Exacerbation/PNA  Pt returns for follow up for COPD exacerbation  Recent admission for COPD flare and PNA .  He was treated with abx. And diuresis  He is feeling better. Decreased cough and congestion .  CXR today shows stable basilar density c/w probable scarring.  Remains on Symbicort and Spiriva .  On O2 at 3l/m . Helios marathon works well.  Vaccines utd.  Denies chest pain, orthopnea, edema or hemoptysis.        Immunization History  Administered Date(s) Administered  . Influenza Split 10/25/2011  . Influenza Whole 09/10/2009, 09/04/2010, 09/12/2012  . Influenza,inj,Quad PF,36+ Mos 10/17/2013, 09/25/2014, 10/22/2015  . Pneumococcal Conjugate-13 01/28/2015  . Pneumococcal Polysaccharide-23 12/13/2006    Tests: PFT 12/24/09>>FEV1 1.60(57%), FEV1% 52, TLC 6.86(130%), DLCO 67%, no BD PSG with 3 liters oxygen 06/12/12>>AHI 1.2, SpO2 low 90%, PLMI 1. Echo 09/27/12>>EF 55 to 60%, mild RA dilation Echo 12/2015 EF 55-60%, Gr 1 DD       rec Continue on Symbicort and Spiriva .  Low salt diet  Continue on Oxygen 3l/m    03/11/2016 acute extended ov/[name] re:  Chief Complaint  Patient presents with  . Acute Visit    increased SOB x3 days with decreased mucus production (clear/milky), tightness, occasional wheezing.  takes 3x as long to recover from exertion.  denies any f/c/s, hemoptysis   was "better"   on 3lpm 24/7 and neb 3 x neb despite symbicort/spiriva>>  then worse x 3 days assoc with dysphagia despite taking carafate qid rec Plan A = Automatic = Symbicort 160 Take 2 puffs first thing in am and then another 2 puffs about 12 hours later.                                      Spiriva 2 pffs and Pantoprazole 40 mg Take 30-60 min before first meal of the day plus continue  zantac at  bedtime  Plan B = Backup Only use your albuterol (proair) as a rescue medication  Plan C = Crisis - only use your albuterol nebulizer if you first try Plan B and it fails to help > ok to use the nebulizer up to every 4 hours but if start needing it regularly call for immediate appointment Prednisone 10 mg take  4 each am x 2 days,   2 each am x 2 days,  1 each am x 2 days and stop  GERD  Diet     04/28/2016 acute ext ov/[name] re: GOLD II at baseline/aecopd maint rx symb 160 2bid/ spriiva respimat Chief Complaint  Patient presents with  . Acute Visit    Pt c/o increased SOB, cough with milky mucus, increased HR and decreased O2 with exertion. Pt denies CP/tightness, f/n/v. Pt has been using flutter valve daily.   back to baseline and not needing much saba at all until sob/cough worse one week prior to OV  - has futter not using  rec Plan A = Automatic = Symbicort 160 Take 2 puffs first thing in am and then another 2 puffs about 12 hours later.   Spiriva 2 pffs and Pantoprazole 40 mg Take 30-60 min before first meal of the  day plus continue  zantac at bedtime  Plan B = Backup Only use your albuterol (proair)  Plan C = Crisis - only use your albuterol nebulizer if you first try Plan B and it fails to help > ok to use the nebulizer up to every 4 hours but if start needing it regularly call for immediate appointment For cough > mucinex dm 1200 mg every 12 hours and supplement with norco if still coughing and use the flutter as much as you can zpak Prednisone 10 mg take  4 each am x 2 days,   2 each am x 2 days,  1 each am x 2 days and stop  GERD diet    08/11/2016  Acute ov/[name] re:  AECOPD/ GOLD II symb/spiriva/singulair  Chief Complaint  Patient presents with  . Acute Visit    Pt c/o increased cough and SOB for the past 2 days. He is coughing up large amounts of clear/milky sputum.    at baseline maybe one saba hfa / 02 3lpm 24/7 Already on norco 4 x daily at baseline Abrupt onset x 2  days worse sob with  Cough> milky sputum only, nothing really purulent  Lots of lifesavers but mint  Comfortable at rest p saba   rec Plan A = Automatic = Continue Symbicort 160 Take 2 puffs first thing in am and then another 2 puffs about 12 hours later. Spiriva 2 pffs and Pantoprazole 40 mg Take 30-60 min before first meal of the day plus continue  zantac at bedtime  Plan B = Backup Only use your albuterol (proair) as a rescue medication Plan C = Crisis - only use your albuterol nebulizer if you first try Plan B and it fails to help > ok to use the nebulizer up to every 4 hours but if start needing it regularly call for immediate appointment For cough > mucinex dm 1200 mg every 12 hours and supplement with norco if still coughing and use the flutter as much as you can zpak Prednisone 10 mg take  4 each am x 2 days,   2 each am x 2 days,  1 each am x 2 days and stop  GERD diet  Please remember to go to the  x-ray department downstairs for your tests - we will call you with the results when they are available.  if can't get comfortable at rest after the neb will need to go to ER    09/01/2016  Acute extended ov/[name] re: onset of flare cough /sob  early to [facility] august 2017  Chief Complaint  Patient presents with  . Acute Visit    Pt c/o increase in SOB x 4 days. C/o prod cough with clear to white mucus and chest tightness. Pt states his O2 has been in the 80's. Pt denies f/c/s.   did improve transiently p last ov but never back to baseline then worse x 4 days with severe hacking cough/ min productive all white mucus and increase saba over baseline. Not using flutter valve or mucinex dm as instructed bu taking norc qid for back pain    >>rx Gabapentin for cough , pred taper , CT sinus >neg     09/20/2016 NP Follow up : GOLD II COPD /O2 3 L, AR w/ positive Rast /High IgE. ., UACS   Patient returns for a two-week follow-up for recent COPD flare. He was set up for a CT sinus that showed no  evidence of acute sinusitis. He was  given a prednisone taper and started Gabapentin 100mg  Three times a day   He is starting to feel some better. Cough is decreased some.  We reviewed all his med and organized them into a med calendar with pt education .  Talked about cost of inhaler . We discussed starting patient assistance papers.  rec Follow med calendar closely and bring to each visit.  Continue on current regimen   09/21/16 Kozlow eval > not tested as on antihistamines> consider low dose maint pred     02/28/2017  f/u ov/[name] re:  Copd II/ AB with MO / 02 dep  Chief Complaint  Patient presents with  . Follow-up    Needs o2 recert. Pt reports his breathing has been worse for the past 5 days. He has also noticed wheezing.    on 02 walking 3lpm = MMRC3 = can't walk 100 yards even at a slow pace at a flat grade s stopping due to sob  Even on 02 3lpm Neb typically needed once daily after supper  Has pred as part of action plan but not following action plan rec Follow med calendar   04/12/2017  f/u ov/[name] re:  Copd II/ AB with MO and 02 dep 3lpm 24/7 maint rx symb/spirva with very good hfa/smi Chief Complaint  Patient presents with  . Follow-up    Pt feels like he is having increase sob with exertion, feels like he is getting sob faster, coughing but unable to produce anything, has some chest congestion, Last dose of prednisone was friday  prednisone worked after only a few doses but finished last dose 04/08/17 and did not follow action plan re restart prn increase sob or need for saba rec Doxycycline 100 mg twice daily x 10 days Elevate the leg as much as you can above the level of your heart Follow instructions re use of prednisone on your med calendar  Take extra furosemide if leg swelling worse than usual        07/04/2017  f/u ov/[name] re:  COPD II/ AB with MO > hypercarbic resp failure on 4lpm 24/7  Chief Complaint  Patient presents with  . Follow-up    Breathing not  improving. He states it seems to be taking him longer to recover. He is wheezing some. His cough is unchanged. He is using proiar 4 x daily on average and neb with albuterol 2 x daily on average.    presently prednisone is 20 mg per day and worse when goes straight ot 10   No obvious day to day or daytime variability or assoc excess/ purulent sputum or mucus plugs or hemoptysis or cp or chest tightness, subjective wheeze or overt sinus or hb symptoms. No unusual exp hx or h/o childhood pna/ asthma or knowledge of premature birth.  Sleeping ok without nocturnal  or early am exacerbation  of respiratory  c/o's or need for noct saba. Also denies any obvious fluctuation of symptoms with weather or environmental changes or other aggravating or alleviating factors except as outlined above   Current Medications, Allergies, Complete Past Medical History, Past Surgical History, Family History, and Social History were reviewed in [employer] record.  ROS  The following are not active complaints unless bolded sore throat, dysphagia, dental problems, itching, sneezing,  nasal congestion or excess/ purulent secretions, ear ache,   fever, chills, sweats, unintended wt loss, classically pleuritic or exertional cp,  orthopnea pnd or leg swelling, presyncope, palpitations, abdominal pain, anorexia, nausea, vomiting, diarrhea  or change in bowel or bladder habits, change in stools or urine, dysuria,hematuria,  rash, arthralgias, visual complaints, headache, numbness, weakness or ataxia or problems with walking or coordination,  change in mood/affect or memory.            Objective:   Physical Exam   obese amb wm nad classic pseudowheeze better with plm     07/04/2017       253 04/12/2017         257  02/28/2017       256  12/27/2016       250  11/15/2016        250  10/15/16 248 lb 3.2 oz (112.6 kg)  09/21/16 251 lb 3.2 oz (113.9 kg)  09/20/16 248 lb (112.5 kg)    Vital signs reviewed -  Note on arrival 02 sats  92% on 4lpm     HEENT: nl dentition, turbinates, and oropharynx. Nl external ear canals without cough reflex   NECK :  without JVD/Nodes/TM/ nl carotid upstrokes bilaterally   LUNGS: no acc muscle use,  Decreased bs in both bases with no wheeze with plm   CV:  RRR  no s3 or murmur or increase in P2,  Trace  pitting sym = lower ext edema   ABD:  Tensely  obese/ large pannus/ soft and nontender with nl inspiratory excursion in the supine position. No bruits or organomegaly, bowel sounds nl  MS:  Nl gait/ ext warm without deformities, calf tenderness, cyanosis or clubbing No obvious joint restrictions   SKIN: warm and dry with ace wrap around L leg   NEURO:  alert, approp, nl sensorium with  no motor deficits

## 2017-07-04 NOTE — Assessment & Plan Note (Signed)
Spirometry 10/15/2016  FEV1 1.41 (52%)  Ratio 61 p am symb/spiriva/saba w/in 4h - added prednisone 20 max and taper off for flares as part of action plan 10/15/2016   04/12/2017  After extensive coaching HFA effectiveness =    90% from a baseline of 90%  - 07/04/2017 changed to pred 20 ceiling and 10 mg floor   Not really clear how much he is really pred dependent at this point since some of his problem is upper airway related but he's stabilized at this point so no need to change rx overall  The goal with a chronic steroid dependent illness is always arriving at the lowest effective dose that controls the disease/symptoms and not accepting a set "formula" which is based on statistics or guidelines that don't always take into account patient  variability or the natural hx of the dz in every individual patient, which may well vary over time.  For now therefore I recommend the patient maintain  20 mg ceiling and 10 mg floor   I had an extended discussion with the patient reviewing all relevant studies completed to date and  lasting 15 to 20 minutes of a 25 minute visit    Each maintenance medication was reviewed in detail including most importantly the difference between maintenance and prns and under what circumstances the prns are to be triggered using an action plan format that is not reflected in the computer generated alphabetically organized AVS but trather by a customized med calendar that reflects the AVS meds with confirmed 100% correlation.   In addition, Please see AVS for unique instructions that I personally wrote and verbalized to the the pt in detail and then reviewed with pt  by my nurse highlighting any  changes in therapy recommended at today's visit to their plan of care.

## 2017-07-04 NOTE — Assessment & Plan Note (Signed)
-   Allergy profile 03/11/16  >  Eos 0.2 /  IgE  359 with pos RAST grass / trees  - flutter valve 08/11/16  Trial of neurontin 100 tid 09/01/2016    - Sinus CT 09/03/2016 >>> Visualized paranasal sinuses are clear. No air-fluid levels or mucosal thickening. Orbital soft tissues and visualized bony structures unremarkable. - Kozlow eval 09/21/16 rec consider immunotherapy  - improved on gabapentin 300 tid as of ov 07/04/2017 > continue

## 2017-07-05 ENCOUNTER — Encounter: Payer: Self-pay | Admitting: Family Medicine

## 2017-07-05 ENCOUNTER — Ambulatory Visit (INDEPENDENT_AMBULATORY_CARE_PROVIDER_SITE_OTHER): Payer: Medicare HMO | Admitting: Family Medicine

## 2017-07-05 VITALS — BP 100/60 | HR 100 | Temp 98.3°F | Resp 24 | Ht 63.0 in | Wt 254.0 lb

## 2017-07-05 DIAGNOSIS — L97919 Non-pressure chronic ulcer of unspecified part of right lower leg with unspecified severity: Secondary | ICD-10-CM

## 2017-07-05 DIAGNOSIS — I872 Venous insufficiency (chronic) (peripheral): Secondary | ICD-10-CM

## 2017-07-05 DIAGNOSIS — L97929 Non-pressure chronic ulcer of unspecified part of left lower leg with unspecified severity: Secondary | ICD-10-CM
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
## 2017-07-06 ENCOUNTER — Other Ambulatory Visit: Payer: Self-pay | Admitting: Internal Medicine

## 2017-07-08 ENCOUNTER — Telehealth: Payer: Self-pay | Admitting: Internal Medicine

## 2017-07-08 DIAGNOSIS — G479 Sleep disorder, unspecified: Secondary | ICD-10-CM | POA: Diagnosis not present

## 2017-07-08 DIAGNOSIS — J449 Chronic obstructive pulmonary disease, unspecified: Secondary | ICD-10-CM | POA: Diagnosis not present

## 2017-07-08 DIAGNOSIS — R0902 Hypoxemia: Secondary | ICD-10-CM | POA: Diagnosis not present

## 2017-07-08 DIAGNOSIS — I2783 Eisenmenger's syndrome: Secondary | ICD-10-CM | POA: Diagnosis not present

## 2017-07-08 MED ORDER — LORAZEPAM 0.5 MG PO TABS: 0.5000 mg | ORAL_TABLET | Freq: Three times a day (TID) | ORAL | 0 refills | Status: DC | PRN

## 2017-07-08 NOTE — Telephone Encounter (Signed)
LORazepam (ATIVAN) 0.5 MG tablet [[id number]][[id number]]  Order Details  Dose, Route, Frequency: As Directed Note to Pharmacy:  Not to exceed 5 additional fills before 08/31/2017    Dispense Quantity:  270 tablet Refills:  0 Fills remaining:  --        Sig: TAKE 1 TABLET EVERY 8 HOURS AS NEEDED       Written Date:  07/07/17 Expiration Date:  01/03/18    Start Date:  07/07/17 End Date:  --         Ordering Provider:  Nyoka CowdenWert, Domnic B, MD DEA #:  [id number] NPI:  [id number]   Authorizing Provider:  Nyoka CowdenWert, Desten B, MD DEA #:  [id number] NPI:  [id number]   Ordering User:  Christen Butteraskin, Leslie M, CMA            Original Order:  LORazepam (ATIVAN) 0.5 MG tablet [[id number]]    Pharmacy:  [id number] - [place], [place] - [address] DEA #:  [id number]    Pharmacy Comments:  --       Fill quantity remaining:  -- Fill quantity used:  --         Order Class   Print     Spoke with patient-Rx was printed at yesterday's visit-should have been with his AVS. Pt checked his paperwork and could not find the Rx. I have called pharmacy and gave verbal RX.  Nothing more needed at this time.

## 2017-07-19 ENCOUNTER — Ambulatory Visit: Payer: Medicare HMO | Admitting: Family Medicine

## 2017-07-22 ENCOUNTER — Ambulatory Visit: Payer: Medicare HMO | Admitting: Family Medicine

## 2017-07-25 ENCOUNTER — Encounter: Payer: Self-pay | Admitting: Family Medicine

## 2017-07-25 ENCOUNTER — Ambulatory Visit (INDEPENDENT_AMBULATORY_CARE_PROVIDER_SITE_OTHER): Payer: Medicare HMO | Admitting: Family Medicine

## 2017-07-25 VITALS — BP 126/70 | HR 64 | Temp 98.6°F | Resp 20 | Wt 254.0 lb

## 2017-07-25 DIAGNOSIS — L97919 Non-pressure chronic ulcer of unspecified part of right lower leg with unspecified severity: Secondary | ICD-10-CM

## 2017-07-25 DIAGNOSIS — I872 Venous insufficiency (chronic) (peripheral): Secondary | ICD-10-CM

## 2017-07-25 DIAGNOSIS — L97929 Non-pressure chronic ulcer of unspecified part of left lower leg with unspecified severity: Secondary | ICD-10-CM
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
## 2017-08-08 ENCOUNTER — Encounter (HOSPITAL_COMMUNITY): Payer: Self-pay | Admitting: Oncology

## 2017-08-08 ENCOUNTER — Emergency Department (HOSPITAL_COMMUNITY): Payer: Medicare HMO

## 2017-08-08 ENCOUNTER — Inpatient Hospital Stay (HOSPITAL_COMMUNITY)
Admission: EM | Admit: 2017-08-08 | Discharge: 2017-08-14 | DRG: 189 | Disposition: A | Discharge status: Home Health | Payer: Medicare HMO | Attending: Internal Medicine | Admitting: Internal Medicine

## 2017-08-08 DIAGNOSIS — J449 Chronic obstructive pulmonary disease, unspecified: Secondary | ICD-10-CM

## 2017-08-08 DIAGNOSIS — N400 Enlarged prostate without lower urinary tract symptoms: Secondary | ICD-10-CM | POA: Diagnosis present

## 2017-08-08 DIAGNOSIS — Z87891 Personal history of nicotine dependence: Secondary | ICD-10-CM

## 2017-08-08 DIAGNOSIS — A419 Sepsis, unspecified organism: Secondary | ICD-10-CM | POA: Diagnosis present

## 2017-08-08 DIAGNOSIS — Z6841 Body Mass Index (BMI) 40.0 and over, adult: Secondary | ICD-10-CM | POA: Diagnosis not present

## 2017-08-08 DIAGNOSIS — E785 Hyperlipidemia, unspecified: Secondary | ICD-10-CM | POA: Diagnosis present

## 2017-08-08 DIAGNOSIS — Z808 Family history of malignant neoplasm of other organs or systems: Secondary | ICD-10-CM

## 2017-08-08 DIAGNOSIS — J439 Emphysema, unspecified: Secondary | ICD-10-CM | POA: Diagnosis not present

## 2017-08-08 DIAGNOSIS — E784 Other hyperlipidemia: Secondary | ICD-10-CM | POA: Diagnosis not present

## 2017-08-08 DIAGNOSIS — I5032 Chronic diastolic (congestive) heart failure: Secondary | ICD-10-CM

## 2017-08-08 DIAGNOSIS — G894 Chronic pain syndrome: Secondary | ICD-10-CM | POA: Diagnosis present

## 2017-08-08 DIAGNOSIS — E039 Hypothyroidism, unspecified: Secondary | ICD-10-CM | POA: Diagnosis present

## 2017-08-08 DIAGNOSIS — R6510 Systemic inflammatory response syndrome (SIRS) of non-infectious origin without acute organ dysfunction: Secondary | ICD-10-CM | POA: Diagnosis not present

## 2017-08-08 DIAGNOSIS — R739 Hyperglycemia, unspecified: Secondary | ICD-10-CM | POA: Diagnosis present

## 2017-08-08 DIAGNOSIS — K219 Gastro-esophageal reflux disease without esophagitis: Secondary | ICD-10-CM | POA: Diagnosis present

## 2017-08-08 DIAGNOSIS — E872 Acidosis: Secondary | ICD-10-CM | POA: Diagnosis not present

## 2017-08-08 DIAGNOSIS — J441 Chronic obstructive pulmonary disease with (acute) exacerbation: Secondary | ICD-10-CM | POA: Diagnosis not present

## 2017-08-08 DIAGNOSIS — J189 Pneumonia, unspecified organism: Secondary | ICD-10-CM

## 2017-08-08 DIAGNOSIS — Z880 Allergy status to penicillin: Secondary | ICD-10-CM | POA: Diagnosis not present

## 2017-08-08 DIAGNOSIS — G479 Sleep disorder, unspecified: Secondary | ICD-10-CM | POA: Diagnosis not present

## 2017-08-08 DIAGNOSIS — T380X5A Adverse effect of glucocorticoids and synthetic analogues, initial encounter: Secondary | ICD-10-CM | POA: Diagnosis present

## 2017-08-08 DIAGNOSIS — I110 Hypertensive heart disease with heart failure: Secondary | ICD-10-CM | POA: Diagnosis present

## 2017-08-08 DIAGNOSIS — J9621 Acute and chronic respiratory failure with hypoxia: Secondary | ICD-10-CM | POA: Diagnosis not present

## 2017-08-08 DIAGNOSIS — F321 Major depressive disorder, single episode, moderate: Secondary | ICD-10-CM | POA: Diagnosis not present

## 2017-08-08 DIAGNOSIS — I10 Essential (primary) hypertension: Secondary | ICD-10-CM | POA: Diagnosis not present

## 2017-08-08 DIAGNOSIS — Z9981 Dependence on supplemental oxygen: Secondary | ICD-10-CM

## 2017-08-08 DIAGNOSIS — F329 Major depressive disorder, single episode, unspecified: Secondary | ICD-10-CM | POA: Diagnosis present

## 2017-08-08 DIAGNOSIS — M1990 Unspecified osteoarthritis, unspecified site: Secondary | ICD-10-CM | POA: Diagnosis present

## 2017-08-08 DIAGNOSIS — R000 Tachycardia, unspecified: Secondary | ICD-10-CM | POA: Diagnosis not present

## 2017-08-08 DIAGNOSIS — I5030 Unspecified diastolic (congestive) heart failure: Secondary | ICD-10-CM | POA: Diagnosis not present

## 2017-08-08 DIAGNOSIS — E038 Other specified hypothyroidism: Secondary | ICD-10-CM

## 2017-08-08 DIAGNOSIS — R0602 Shortness of breath: Secondary | ICD-10-CM | POA: Diagnosis not present

## 2017-08-08 DIAGNOSIS — Z96651 Presence of right artificial knee joint: Secondary | ICD-10-CM | POA: Diagnosis present

## 2017-08-08 DIAGNOSIS — R7303 Prediabetes: Secondary | ICD-10-CM | POA: Diagnosis not present

## 2017-08-08 DIAGNOSIS — F419 Anxiety disorder, unspecified: Secondary | ICD-10-CM | POA: Diagnosis present

## 2017-08-08 DIAGNOSIS — R0902 Hypoxemia: Secondary | ICD-10-CM | POA: Diagnosis not present

## 2017-08-08 DIAGNOSIS — Z833 Family history of diabetes mellitus: Secondary | ICD-10-CM | POA: Diagnosis not present

## 2017-08-08 DIAGNOSIS — I2783 Eisenmenger's syndrome: Secondary | ICD-10-CM | POA: Diagnosis not present

## 2017-08-08 DIAGNOSIS — Z8249 Family history of ischemic heart disease and other diseases of the circulatory system: Secondary | ICD-10-CM | POA: Diagnosis not present

## 2017-08-08 DIAGNOSIS — Z800 Family history of malignant neoplasm of digestive organs: Secondary | ICD-10-CM | POA: Diagnosis not present

## 2017-08-08 LAB — URINALYSIS, ROUTINE W REFLEX MICROSCOPIC
Bilirubin Urine: NEGATIVE
Glucose, UA: NEGATIVE mg/dL
Hgb urine dipstick: NEGATIVE
Ketones, ur: NEGATIVE mg/dL
Leukocytes, UA: NEGATIVE
Nitrite: NEGATIVE
Protein, ur: NEGATIVE mg/dL
Specific Gravity, Urine: 1.016 (ref 1.005–1.030)
pH: 5 (ref 5.0–8.0)

## 2017-08-08 LAB — I-STAT ARTERIAL BLOOD GAS, ED
Acid-Base Excess: 16 mmol/L — ABNORMAL HIGH (ref 0.0–2.0)
Bicarbonate: 43.2 mmol/L — ABNORMAL HIGH (ref 20.0–28.0)
O2 Saturation: 98 %
PCO2 ART: 63.5 mmHg — AB (ref 32.0–48.0)
PH ART: 7.447 (ref 7.350–7.450)
PO2 ART: 117 mmHg — AB (ref 83.0–108.0)
Patient temperature: 101.4
TCO2: 45 mmol/L — ABNORMAL HIGH (ref 22–32)

## 2017-08-08 LAB — PROTIME-INR
INR: 1.04
Prothrombin Time: 13.6 seconds (ref 11.4–15.2)

## 2017-08-08 LAB — CG4 I-STAT (LACTIC ACID): Lactic Acid, Venous: 2.4 mmol/L (ref 0.5–1.9)

## 2017-08-08 LAB — CBC WITH DIFFERENTIAL/PLATELET
BASOS ABS: 0 10*3/uL (ref 0.0–0.1)
Basophils Relative: 0 %
EOS PCT: 1 %
Eosinophils Absolute: 0.1 10*3/uL (ref 0.0–0.7)
HCT: 40 % (ref 39.0–52.0)
Hemoglobin: 12.2 g/dL — ABNORMAL LOW (ref 13.0–17.0)
LYMPHS PCT: 11 %
Lymphs Abs: 1.1 10*3/uL (ref 0.7–4.0)
MCH: 29.3 pg (ref 26.0–34.0)
MCHC: 30.5 g/dL (ref 30.0–36.0)
MCV: 96.2 fL (ref 78.0–100.0)
MONO ABS: 0.7 10*3/uL (ref 0.1–1.0)
Monocytes Relative: 7 %
Neutro Abs: 8.3 10*3/uL — ABNORMAL HIGH (ref 1.7–7.7)
Neutrophils Relative %: 81 %
PLATELETS: 158 10*3/uL (ref 150–400)
RBC: 4.16 MIL/uL — ABNORMAL LOW (ref 4.22–5.81)
RDW: 14.8 % (ref 11.5–15.5)
WBC: 10.2 10*3/uL (ref 4.0–10.5)

## 2017-08-08 LAB — I-STAT TROPONIN, ED: TROPONIN I, POC: 0.01 ng/mL (ref 0.00–0.08)

## 2017-08-08 LAB — PHOSPHORUS: PHOSPHORUS: 2.9 mg/dL (ref 2.5–4.6)

## 2017-08-08 LAB — COMPREHENSIVE METABOLIC PANEL
ALT: 29 U/L (ref 17–63)
ANION GAP: 11 (ref 5–15)
AST: 20 U/L (ref 15–41)
Albumin: 3.4 g/dL — ABNORMAL LOW (ref 3.5–5.0)
Alkaline Phosphatase: 77 U/L (ref 38–126)
BUN: 13 mg/dL (ref 6–20)
CO2: 34 mmol/L — ABNORMAL HIGH (ref 22–32)
Calcium: 8.9 mg/dL (ref 8.9–10.3)
Chloride: 93 mmol/L — ABNORMAL LOW (ref 101–111)
Creatinine, Ser: 0.85 mg/dL (ref 0.61–1.24)
Glucose, Bld: 143 mg/dL — ABNORMAL HIGH (ref 65–99)
POTASSIUM: 3.3 mmol/L — AB (ref 3.5–5.1)
Sodium: 138 mmol/L (ref 135–145)
Total Bilirubin: 0.7 mg/dL (ref 0.3–1.2)
Total Protein: 6.2 g/dL — ABNORMAL LOW (ref 6.5–8.1)

## 2017-08-08 LAB — HEMOGLOBIN A1C
HEMOGLOBIN A1C: 6.2 % — AB (ref 4.8–5.6)
Mean Plasma Glucose: 131.24 mg/dL

## 2017-08-08 LAB — LACTIC ACID, PLASMA
LACTIC ACID, VENOUS: 2.4 mmol/L — AB (ref 0.5–1.9)
Lactic Acid, Venous: 3.1 mmol/L (ref 0.5–1.9)

## 2017-08-08 LAB — STREP PNEUMONIAE URINARY ANTIGEN: Strep Pneumo Urinary Antigen: NEGATIVE

## 2017-08-08 LAB — EXPECTORATED SPUTUM ASSESSMENT W REFEX TO RESP CULTURE

## 2017-08-08 LAB — I-STAT CG4 LACTIC ACID, ED
LACTIC ACID, VENOUS: 2.8 mmol/L — AB (ref 0.5–1.9)
Lactic Acid, Venous: 2.04 mmol/L (ref 0.5–1.9)

## 2017-08-08 LAB — APTT: APTT: 29 s (ref 24–36)

## 2017-08-08 LAB — EXPECTORATED SPUTUM ASSESSMENT W GRAM STAIN, RFLX TO RESP C

## 2017-08-08 LAB — CBG MONITORING, ED: Glucose-Capillary: 200 mg/dL — ABNORMAL HIGH (ref 65–99)

## 2017-08-08 LAB — PROCALCITONIN: Procalcitonin: 0.62 ng/mL

## 2017-08-08 LAB — TROPONIN I
TROPONIN I: 0.12 ng/mL — AB (ref <0.03)
Troponin I: <0.03 ng/mL (ref <0.03)

## 2017-08-08 LAB — MRSA PCR SCREENING: MRSA by PCR: NEGATIVE

## 2017-08-08 LAB — MAGNESIUM: Magnesium: 1.6 mg/dL — ABNORMAL LOW (ref 1.7–2.4)

## 2017-08-08 LAB — BRAIN NATRIURETIC PEPTIDE: B Natriuretic Peptide: 38.3 pg/mL (ref 0.0–100.0)

## 2017-08-08 LAB — CORTISOL: Cortisol, Plasma: 4.9 ug/dL

## 2017-08-08 MED ORDER — VANCOMYCIN HCL IN DEXTROSE 1-5 GM/200ML-% IV SOLN: 1000.0000 mg | Freq: Once | INTRAVENOUS | Status: DC

## 2017-08-08 MED ORDER — METOPROLOL TARTRATE 5 MG/5ML IV SOLN: 5.0000 mg | Freq: Four times a day (QID) | INTRAVENOUS | Status: DC | PRN

## 2017-08-08 MED ORDER — ACETAMINOPHEN 325 MG PO TABS: 650.0000 mg | ORAL_TABLET | Freq: Four times a day (QID) | ORAL | Status: DC | PRN

## 2017-08-08 MED ORDER — TIZANIDINE HCL 4 MG PO CAPS: 4.0000 mg | ORAL_CAPSULE | Freq: Two times a day (BID) | ORAL | Status: DC

## 2017-08-08 MED ORDER — HYDROCODONE-ACETAMINOPHEN 10-325 MG PO TABS
1.0000 | ORAL_TABLET | Freq: Four times a day (QID) | ORAL | Status: DC | PRN
  Administered 2017-08-09 – 2017-08-12 (×3): 1 via ORAL
  Filled 2017-08-08 (×3): qty 1

## 2017-08-08 MED ORDER — LORAZEPAM 0.5 MG PO TABS
0.5000 mg | ORAL_TABLET | Freq: Three times a day (TID) | ORAL | Status: DC | PRN
  Filled 2017-08-08: qty 1

## 2017-08-08 MED ORDER — LEVALBUTEROL HCL 1.25 MG/0.5ML IN NEBU
1.2500 mg | INHALATION_SOLUTION | Freq: Four times a day (QID) | RESPIRATORY_TRACT | Status: DC | PRN
  Administered 2017-08-08 – 2017-08-09 (×2): 1.25 mg via RESPIRATORY_TRACT
  Filled 2017-08-08 (×3): qty 0.5

## 2017-08-08 MED ORDER — VANCOMYCIN HCL IN DEXTROSE 1-5 GM/200ML-% IV SOLN
1000.0000 mg | Freq: Two times a day (BID) | INTRAVENOUS | Status: DC
  Administered 2017-08-09 – 2017-08-11 (×5): 1000 mg via INTRAVENOUS
  Filled 2017-08-08 (×7): qty 200

## 2017-08-08 MED ORDER — TAMSULOSIN HCL 0.4 MG PO CAPS
0.4000 mg | ORAL_CAPSULE | Freq: Every day | ORAL | Status: DC
  Administered 2017-08-08 – 2017-08-14 (×6): 0.4 mg via ORAL
  Filled 2017-08-08 (×8): qty 1

## 2017-08-08 MED ORDER — ACETAMINOPHEN 650 MG RE SUPP
650.0000 mg | Freq: Once | RECTAL | Status: AC
  Administered 2017-08-08: 650 mg via RECTAL
  Filled 2017-08-08: qty 1

## 2017-08-08 MED ORDER — ACETAMINOPHEN 650 MG RE SUPP: 650.0000 mg | Freq: Four times a day (QID) | RECTAL | Status: DC | PRN

## 2017-08-08 MED ORDER — SODIUM CHLORIDE 0.9 % IV SOLN: 250.0000 mL | INTRAVENOUS | Status: DC | PRN

## 2017-08-08 MED ORDER — ENOXAPARIN SODIUM 40 MG/0.4ML ~~LOC~~ SOLN
40.0000 mg | SUBCUTANEOUS | Status: DC
  Administered 2017-08-08 – 2017-08-14 (×7): 40 mg via SUBCUTANEOUS
  Filled 2017-08-08 (×7): qty 0.4

## 2017-08-08 MED ORDER — SODIUM CHLORIDE 0.9% FLUSH
3.0000 mL | Freq: Two times a day (BID) | INTRAVENOUS | Status: DC
  Administered 2017-08-08 – 2017-08-10 (×5): 3 mL via INTRAVENOUS

## 2017-08-08 MED ORDER — LEVOFLOXACIN IN D5W 750 MG/150ML IV SOLN: 750.0000 mg | Freq: Once | INTRAVENOUS | Status: DC

## 2017-08-08 MED ORDER — VANCOMYCIN HCL 10 G IV SOLR
2000.0000 mg | Freq: Once | INTRAVENOUS | Status: AC
  Administered 2017-08-08: 2000 mg via INTRAVENOUS
  Filled 2017-08-08: qty 2000

## 2017-08-08 MED ORDER — POTASSIUM CHLORIDE CRYS ER 20 MEQ PO TBCR
20.0000 meq | EXTENDED_RELEASE_TABLET | Freq: Every day | ORAL | Status: DC
  Administered 2017-08-08 – 2017-08-14 (×7): 20 meq via ORAL
  Filled 2017-08-08 (×7): qty 1

## 2017-08-08 MED ORDER — PANTOPRAZOLE SODIUM 40 MG PO TBEC
40.0000 mg | DELAYED_RELEASE_TABLET | Freq: Every day | ORAL | Status: DC
  Administered 2017-08-08 – 2017-08-14 (×7): 40 mg via ORAL
  Filled 2017-08-08 (×7): qty 1

## 2017-08-08 MED ORDER — SODIUM CHLORIDE 0.9% FLUSH
3.0000 mL | INTRAVENOUS | Status: DC | PRN
  Administered 2017-08-08: 3 mL via INTRAVENOUS
  Filled 2017-08-08: qty 3

## 2017-08-08 MED ORDER — TIZANIDINE HCL 4 MG PO TABS
4.0000 mg | ORAL_TABLET | Freq: Two times a day (BID) | ORAL | Status: DC
  Administered 2017-08-08 – 2017-08-14 (×13): 4 mg via ORAL
  Filled 2017-08-08 (×13): qty 1

## 2017-08-08 MED ORDER — DICLOFENAC SODIUM 75 MG PO TBEC
75.0000 mg | DELAYED_RELEASE_TABLET | Freq: Two times a day (BID) | ORAL | Status: DC
  Administered 2017-08-08 – 2017-08-14 (×13): 75 mg via ORAL
  Filled 2017-08-08 (×17): qty 1

## 2017-08-08 MED ORDER — METHYLPREDNISOLONE SODIUM SUCC 125 MG IJ SOLR
60.0000 mg | Freq: Three times a day (TID) | INTRAMUSCULAR | Status: DC
  Administered 2017-08-08 – 2017-08-12 (×13): 60 mg via INTRAVENOUS
  Filled 2017-08-08 (×13): qty 2

## 2017-08-08 MED ORDER — GUAIFENESIN ER 600 MG PO TB12
1200.0000 mg | ORAL_TABLET | Freq: Two times a day (BID) | ORAL | Status: DC
  Administered 2017-08-08 – 2017-08-14 (×13): 1200 mg via ORAL
  Filled 2017-08-08 (×13): qty 2

## 2017-08-08 MED ORDER — LEVOTHYROXINE SODIUM 100 MCG PO TABS
200.0000 ug | ORAL_TABLET | Freq: Every day | ORAL | Status: DC
  Administered 2017-08-09 – 2017-08-14 (×6): 200 ug via ORAL
  Filled 2017-08-08 (×6): qty 2

## 2017-08-08 MED ORDER — MOMETASONE FURO-FORMOTEROL FUM 200-5 MCG/ACT IN AERO
2.0000 | INHALATION_SPRAY | Freq: Two times a day (BID) | RESPIRATORY_TRACT | Status: DC
  Administered 2017-08-08 – 2017-08-13 (×12): 2 via RESPIRATORY_TRACT
  Filled 2017-08-08: qty 8.8

## 2017-08-08 MED ORDER — LEVOFLOXACIN IN D5W 750 MG/150ML IV SOLN
750.0000 mg | Freq: Once | INTRAVENOUS | Status: AC
  Administered 2017-08-08: 750 mg via INTRAVENOUS
  Filled 2017-08-08: qty 150

## 2017-08-08 MED ORDER — MONTELUKAST SODIUM 10 MG PO TABS
10.0000 mg | ORAL_TABLET | Freq: Every day | ORAL | Status: DC
  Administered 2017-08-08 – 2017-08-13 (×6): 10 mg via ORAL
  Filled 2017-08-08 (×7): qty 1

## 2017-08-08 MED ORDER — DULOXETINE HCL 60 MG PO CPEP
60.0000 mg | ORAL_CAPSULE | Freq: Every day | ORAL | Status: DC
  Administered 2017-08-09 – 2017-08-14 (×6): 60 mg via ORAL
  Filled 2017-08-08 (×6): qty 1

## 2017-08-08 MED ORDER — FAMOTIDINE 20 MG PO TABS
20.0000 mg | ORAL_TABLET | Freq: Every day | ORAL | Status: DC
  Administered 2017-08-08 – 2017-08-14 (×6): 20 mg via ORAL
  Filled 2017-08-08 (×8): qty 1

## 2017-08-08 MED ORDER — PROMETHAZINE HCL 25 MG PO TABS: 12.5000 mg | ORAL_TABLET | Freq: Four times a day (QID) | ORAL | Status: DC | PRN

## 2017-08-08 MED ORDER — ATORVASTATIN CALCIUM 10 MG PO TABS
10.0000 mg | ORAL_TABLET | Freq: Every day | ORAL | Status: DC
  Administered 2017-08-08 – 2017-08-10 (×3): 10 mg via ORAL
  Filled 2017-08-08 (×3): qty 1

## 2017-08-08 MED ORDER — GABAPENTIN 300 MG PO CAPS
300.0000 mg | ORAL_CAPSULE | Freq: Three times a day (TID) | ORAL | Status: DC
  Administered 2017-08-08 – 2017-08-14 (×19): 300 mg via ORAL
  Filled 2017-08-08 (×19): qty 1

## 2017-08-08 MED ORDER — FUROSEMIDE 10 MG/ML IJ SOLN
100.0000 mg | Freq: Once | INTRAVENOUS | Status: AC
  Administered 2017-08-08: 100 mg via INTRAVENOUS
  Filled 2017-08-08 (×2): qty 10

## 2017-08-08 MED ORDER — LEVOFLOXACIN IN D5W 750 MG/150ML IV SOLN
750.0000 mg | INTRAVENOUS | Status: AC
  Administered 2017-08-09 – 2017-08-14 (×6): 750 mg via INTRAVENOUS
  Filled 2017-08-08 (×6): qty 150
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
## 2017-08-08 NOTE — Progress Notes (Signed)
Pharmacy Antibiotic Note  [name] is a 66 y.o. male admitted on 08/08/2017 with pneumonia.  Pharmacy has been consulted for Vancocin and Levaquin dosing.  Plan: Vancomycin 2000mg  x1 then 1000mg  IV every 12 hours.  Goal trough 15-20 mcg/mL.  Levaquin 750mg  IV every 24 hours.  Height: 5\' 4"  (162.6 cm) Weight: 254 lb (115.2 kg) IBW/kg (Calculated) : 59.2  Temp (24hrs), Avg:101.4 F (38.6 C), Min:101.4 F (38.6 C), Max:101.4 F (38.6 C)   Recent Labs Lab 08/08/17 0642 08/08/17 0656  WBC 10.2  --   LATICACIDVEN  --  2.04*     Allergies  Allergen Reactions  . Penicillins Swelling     Thank you for allowing pharmacy to be a part of this patient's care.  Vernard Gambles, PharmD, BCPS  08/08/2017 7:10 AM

## 2017-08-08 NOTE — ED Notes (Signed)
The pt has less effort to breathe at present

## 2017-08-08 NOTE — ED Notes (Signed)
Pt CBG 200.

## 2017-08-09 ENCOUNTER — Inpatient Hospital Stay (HOSPITAL_COMMUNITY): Payer: Medicare HMO

## 2017-08-09 DIAGNOSIS — I5030 Unspecified diastolic (congestive) heart failure: Secondary | ICD-10-CM

## 2017-08-09 DIAGNOSIS — F419 Anxiety disorder, unspecified: Secondary | ICD-10-CM

## 2017-08-09 DIAGNOSIS — G894 Chronic pain syndrome: Secondary | ICD-10-CM

## 2017-08-09 DIAGNOSIS — F321 Major depressive disorder, single episode, moderate: Secondary | ICD-10-CM

## 2017-08-09 DIAGNOSIS — E784 Other hyperlipidemia: Secondary | ICD-10-CM

## 2017-08-09 DIAGNOSIS — R7303 Prediabetes: Secondary | ICD-10-CM

## 2017-08-09 DIAGNOSIS — J441 Chronic obstructive pulmonary disease with (acute) exacerbation: Secondary | ICD-10-CM

## 2017-08-09 LAB — EXPECTORATED SPUTUM ASSESSMENT W GRAM STAIN, RFLX TO RESP C

## 2017-08-09 LAB — BASIC METABOLIC PANEL
Anion gap: 8 (ref 5–15)
BUN: 16 mg/dL (ref 6–20)
CALCIUM: 9.8 mg/dL (ref 8.9–10.3)
CO2: 39 mmol/L — ABNORMAL HIGH (ref 22–32)
CREATININE: 0.9 mg/dL (ref 0.61–1.24)
Chloride: 94 mmol/L — ABNORMAL LOW (ref 101–111)
GFR calc Af Amer: >60 mL/min (ref >60)
GLUCOSE: 185 mg/dL — AB (ref 65–99)
Potassium: 4 mmol/L (ref 3.5–5.1)
SODIUM: 141 mmol/L (ref 135–145)

## 2017-08-09 LAB — CBC
HCT: 38.3 % — ABNORMAL LOW (ref 39.0–52.0)
HCT: 38.5 % — ABNORMAL LOW (ref 39.0–52.0)
Hemoglobin: 11.7 g/dL — ABNORMAL LOW (ref 13.0–17.0)
Hemoglobin: 11.7 g/dL — ABNORMAL LOW (ref 13.0–17.0)
MCH: 29.1 pg (ref 26.0–34.0)
MCH: 28.7 pg (ref 26.0–34.0)
MCHC: 30.5 g/dL (ref 30.0–36.0)
MCHC: 30.4 g/dL (ref 30.0–36.0)
MCV: 95.3 fL (ref 78.0–100.0)
MCV: 94.6 fL (ref 78.0–100.0)
PLATELETS: 171 10*3/uL (ref 150–400)
PLATELETS: 183 10*3/uL (ref 150–400)
RBC: 4.02 MIL/uL — ABNORMAL LOW (ref 4.22–5.81)
RBC: 4.07 MIL/uL — AB (ref 4.22–5.81)
RDW: 14.6 % (ref 11.5–15.5)
RDW: 14.7 % (ref 11.5–15.5)
WBC: 12.2 10*3/uL — ABNORMAL HIGH (ref 4.0–10.5)
WBC: 12.1 10*3/uL — ABNORMAL HIGH (ref 4.0–10.5)

## 2017-08-09 LAB — RESPIRATORY PANEL BY PCR
Adenovirus: NOT DETECTED
BORDETELLA PERTUSSIS-RVPCR: NOT DETECTED
CORONAVIRUS HKU1-RVPPCR: NOT DETECTED
CORONAVIRUS OC43-RVPPCR: NOT DETECTED
Chlamydophila pneumoniae: NOT DETECTED
Coronavirus 229E: NOT DETECTED
Coronavirus NL63: NOT DETECTED
Influenza A: NOT DETECTED
Influenza B: NOT DETECTED
METAPNEUMOVIRUS-RVPPCR: NOT DETECTED
Mycoplasma pneumoniae: NOT DETECTED
PARAINFLUENZA VIRUS 1-RVPPCR: NOT DETECTED
PARAINFLUENZA VIRUS 2-RVPPCR: NOT DETECTED
PARAINFLUENZA VIRUS 3-RVPPCR: NOT DETECTED
Parainfluenza Virus 4: NOT DETECTED
RHINOVIRUS / ENTEROVIRUS - RVPPCR: NOT DETECTED
Respiratory Syncytial Virus: NOT DETECTED

## 2017-08-09 LAB — LACTIC ACID, PLASMA
LACTIC ACID, VENOUS: 1.5 mmol/L (ref 0.5–1.9)
Lactic Acid, Venous: 3.3 mmol/L (ref 0.5–1.9)

## 2017-08-09 LAB — LEGIONELLA PNEUMOPHILA SEROGP 1 UR AG: L. pneumophila Serogp 1 Ur Ag: NEGATIVE

## 2017-08-09 LAB — EXPECTORATED SPUTUM ASSESSMENT W REFEX TO RESP CULTURE

## 2017-08-09 LAB — HIV ANTIBODY (ROUTINE TESTING W REFLEX): HIV Screen 4th Generation wRfx: NONREACTIVE

## 2017-08-09 LAB — ECHOCARDIOGRAM COMPLETE
Height: 64 in
WEIGHTICAEL: 4134.07 [oz_av]

## 2017-08-09 MED ORDER — LEVALBUTEROL HCL 1.25 MG/0.5ML IN NEBU: 1.2500 mg | INHALATION_SOLUTION | Freq: Four times a day (QID) | RESPIRATORY_TRACT | Status: DC

## 2017-08-09 MED ORDER — LEVALBUTEROL HCL 0.63 MG/3ML IN NEBU
0.6300 mg | INHALATION_SOLUTION | Freq: Four times a day (QID) | RESPIRATORY_TRACT | Status: DC | PRN
  Administered 2017-08-09: 0.63 mg via RESPIRATORY_TRACT
  Filled 2017-08-09 (×2): qty 3

## 2017-08-09 MED ORDER — LEVALBUTEROL HCL 1.25 MG/0.5ML IN NEBU
1.2500 mg | INHALATION_SOLUTION | Freq: Four times a day (QID) | RESPIRATORY_TRACT | Status: DC
  Administered 2017-08-09 – 2017-08-13 (×13): 1.25 mg via RESPIRATORY_TRACT
  Filled 2017-08-09 (×14): qty 0.5

## 2017-08-09 MED ORDER — SODIUM CHLORIDE 3 % IN NEBU
4.0000 mL | INHALATION_SOLUTION | Freq: Once | RESPIRATORY_TRACT | Status: AC
  Administered 2017-08-09: 4 mL via RESPIRATORY_TRACT
  Filled 2017-08-09: qty 4

## 2017-08-09 MED ORDER — SUCRALFATE 1 G PO TABS
1.0000 g | ORAL_TABLET | Freq: Three times a day (TID) | ORAL | Status: DC
  Administered 2017-08-09 – 2017-08-14 (×21): 1 g via ORAL
  Filled 2017-08-09 (×24): qty 1

## 2017-08-09 MED ORDER — LORAZEPAM 2 MG/ML IJ SOLN
0.5000 mg | Freq: Three times a day (TID) | INTRAMUSCULAR | Status: DC
  Administered 2017-08-09 – 2017-08-10 (×4): 1 mg via INTRAVENOUS
  Filled 2017-08-09 (×4): qty 1

## 2017-08-09 MED ORDER — LEVALBUTEROL HCL 1.25 MG/0.5ML IN NEBU
1.2500 mg | INHALATION_SOLUTION | Freq: Four times a day (QID) | RESPIRATORY_TRACT | Status: DC
  Administered 2017-08-09: 1.25 mg via RESPIRATORY_TRACT
  Filled 2017-08-09: qty 0.5

## 2017-08-09 MED ORDER — PERFLUTREN LIPID MICROSPHERE
1.0000 mL | INTRAVENOUS | Status: AC | PRN
  Administered 2017-08-09: 2 mL via INTRAVENOUS
  Filled 2017-08-09: qty 10

## 2017-08-09 NOTE — Progress Notes (Addendum)
PROGRESS NOTE    [name]  NWG:[id number] DOB: 09-12-1951 DOA: 08/08/2017 PCP: Donita Brooks, MD   Brief Narrative:  66 y.o. WM PMHx Anxiety, Depression BPH, Chronic Respiratory failure, COPD, Chronic Diastolic CHF, HTN, HLD, Hypothyroid.   Approximately 03:00 on day of admission when patient awoke from sleep with a sensation of being unable to breathe. Patient attempted use his nebulizer without improvement. O2 saturations were at 81% on his baseline 4 L. EMS was called and patient was brought to the emergency room. Placed on BiPAP. Attempted to wean off BiPAP after approximately 2-1/2 hours without success. Patient endorses baseline intermittent productive cough and fluctuating lower extremity edema. Patient also states he felt "hot" when he awoke in respiratory distress. Denies dysuria, frequency, chest pain, palpitations, focal neurological deficit, rash.    Subjective: 8/28 A/O 4, negative CP, positive SOB, negative abdominal pain, negative N/V   Assessment & Plan:   Active Problems:   Hypothyroidism   Essential hypertension   G E R D   Acute on chronic respiratory failure with hypoxia (HCC)   Chronic diastolic heart failure (HCC)   COPD GOLD II   Sepsis (HCC)  Sepsis unspecified organism -At admission criteria for sepsis temp> 38.6, HR> 90, RR> 20, lactic acid= 2.04 -Complete 7 days of antibiotics  -Solu-Medrol -Currently on 5 L O2 via [place] (home regimen 4 L O2)     Acute on chronic respiratory failure with hypoxia/COPD exacerbation:  -On home O2 4 L -Solu-Medrol 60 mg TID -Xopenex QID -Dulera -Singulair -Mucinex -Induced sputum pending   Chronic Diastolic CHF -Echocardiogram January 2017, EF 55% grade 1 diastolic dysfunction -Echocardiogram pending -Strict in and out -Daily weight -Hold Lasix, will assess daily: PRN -PCXR 8/29    Tachycardia:  -Most likely from nebulizers, steroids, hypoxia -Monitor closely -Lopressor PRN  Essential  HTN -Currently normotensive hold BP medication    PreDiabetes  -8/27 Hemoglobin A1c = 6.2  -Restart metformin 500 mg BID  HLD: - cotnineu lipitor -Lipid panel pending   GERD: - PPI -Home dose Carafate    Depression/: - continue cymbalta   Crhonic pain syndrome:  - continue neurontin, voltaren, Norco   BPH: - cotninue flomax   Anxiety   - Ativan 0.5-1 mg TID anxiety/air hunger    DVT prophylaxis: Lovenox Code Status: Full  Family Communication: Wife present at bedside for discussion of plan of care Disposition Plan: TBD   Consultants:  None    Procedures/Significant Events:      I have personally reviewed and interpreted all radiology studies and my findings are as above.  VENTILATOR SETTINGS: None   Cultures 8/27 respiratory virus panel negative 8/27 HIV negative 8/27 blood pending 8/28 sputum pending   Antimicrobials: Anti-infectives    Start     Stop   08/09/17 0800  levofloxacin (LEVAQUIN) IVPB 750 mg         08/08/17 1800  vancomycin (VANCOCIN) IVPB 1000 mg/200 mL premix         08/08/17 0715  vancomycin (VANCOCIN) 2,000 mg in sodium chloride 0.9 % 500 mL IVPB     08/08/17 1008   08/08/17 0715  levofloxacin (LEVAQUIN) IVPB 750 mg  Status:  Discontinued     08/08/17 0713   08/08/17 0700  levofloxacin (LEVAQUIN) IVPB 750 mg     08/08/17 1000   08/08/17 0700  vancomycin (VANCOCIN) IVPB 1000 mg/200 mL premix  Status:  Discontinued     08/08/17 0713       Devices  LINES / TUBES:      Continuous Infusions: . sodium chloride    . levofloxacin (LEVAQUIN) IV    . vancomycin       Objective: Vitals:   08/09/17 0506 08/09/17 0513 08/09/17 0731 08/09/17 0750  BP: (!) 142/81   115/71  Pulse: 98   69  Resp:    20  Temp: 98.4 F (36.9 C)   98.5 F (36.9 C)  TempSrc: Oral   Oral  SpO2: 93% 96% 95% 94%  Weight:      Height:        Intake/Output Summary (Last 24 hours) at 08/09/17 0800 Last data filed at 08/08/17 2234   Gross per 24 hour  Intake              719 ml  Output              650 ml  Net               69 ml   Filed Weights   08/08/17 0656 08/08/17 1625  Weight: 254 lb (115.2 kg) 258 lb 6.1 oz (117.2 kg)    Examination:  General: A/O 4, positive acute on chronic respiratory distress Neck:  Negative scars, masses, torticollis, lymphadenopathy, JVD Lungs: tachypnea, diffuse poor air movement, diffuse expiratory wheezing without crackles Cardiovascular: Tachycardic, Regular rhythm without murmur gallop or rub normal S1 and S2 Abdomen: MORBIDLY OBESE, negative abdominal pain, positive distention (hernia), positive soft, bowel sounds, no rebound, no ascites, no appreciable mass Extremities: No significant cyanosis, clubbing, or edema bilateral lower extremities Skin: Negative rashes, lesions, ulcers Psychiatric:  Positive depression, positive anxiety, negative fatigue, negative mania  Central nervous system:  Cranial nerves II through XII intact, tongue/uvula midline, all extremities muscle strength 5/5, sensation intact throughout,negative dysarthria, negative expressive aphasia, negative receptive aphasia.  .     Data Reviewed: Care during the described time interval was provided by me .  I have reviewed this patient's available data, including medical history, events of note, physical examination, and all test results as part of my evaluation.   CBC:  Recent Labs Lab 08/08/17 0642 08/09/17 0453  WBC 10.2 12.2*  NEUTROABS 8.3*  --   HGB 12.2* 11.7*  HCT 40.0 38.3*  MCV 96.2 95.3  PLT 158 171   Basic Metabolic Panel:  Recent Labs Lab 08/08/17 0642 08/08/17 0958 08/09/17 0453  NA 138  --  141  K 3.3*  --  4.0  CL 93*  --  94*  CO2 34*  --  39*  GLUCOSE 143*  --  185*  BUN 13  --  16  CREATININE 0.85  --  0.90  CALCIUM 8.9  --  9.8  MG  --  1.6*  --   PHOS  --  2.9  --    GFR: Estimated Creatinine Clearance: 96.6 mL/min (by C-G formula based on SCr of 0.9  mg/dL). Liver Function Tests:  Recent Labs Lab 08/08/17 0642  AST 20  ALT 29  ALKPHOS 77  BILITOT 0.7  PROT 6.2*  ALBUMIN 3.4*   No results for input(s): LIPASE, AMYLASE in the last 168 hours. No results for input(s): AMMONIA in the last 168 hours. Coagulation Profile:  Recent Labs Lab 08/08/17 0958  INR 1.04   Cardiac Enzymes:  Recent Labs Lab 08/08/17 0958 08/08/17 1558 08/08/17 2117  TROPONINI <0.03 <0.03 0.12*   BNP (last 3 results)  Recent Labs  05/27/17 1118  PROBNP 28.0   HbA1C:  Recent Labs  08/08/17 0958  HGBA1C 6.2*   CBG:  Recent Labs Lab 08/08/17 1209  GLUCAP 200*   Lipid Profile: No results for input(s): CHOL, HDL, LDLCALC, TRIG, CHOLHDL, LDLDIRECT in the last 72 hours. Thyroid Function Tests: No results for input(s): TSH, T4TOTAL, FREET4, T3FREE, THYROIDAB in the last 72 hours. Anemia Panel: No results for input(s): VITAMINB12, FOLATE, FERRITIN, TIBC, IRON, RETICCTPCT in the last 72 hours. Urine analysis:    Component Value Date/Time   COLORURINE YELLOW 08/08/2017 1200   APPEARANCEUR CLEAR 08/08/2017 1200   LABSPEC 1.016 08/08/2017 1200   PHURINE 5.0 08/08/2017 1200   GLUCOSEU NEGATIVE 08/08/2017 1200   HGBUR NEGATIVE 08/08/2017 1200   BILIRUBINUR NEGATIVE 08/08/2017 1200   KETONESUR NEGATIVE 08/08/2017 1200   PROTEINUR NEGATIVE 08/08/2017 1200   UROBILINOGEN 0.2 12/25/2010 2254   NITRITE NEGATIVE 08/08/2017 1200   LEUKOCYTESUR NEGATIVE 08/08/2017 1200   Sepsis Labs: @LABRCNTIP (procalcitonin:4,lacticidven:4)  ) Recent Results (from the past 240 hour(s))  Culture, sputum-assessment     Status: None   Collection Time: 08/08/17  1:42 PM  Result Value Ref Range Status   Specimen Description EXPECTORATED SPUTUM  Final   Special Requests NONE  Final   Sputum evaluation   Final    Sputum specimen not acceptable for testing.  Please recollect.   Gram Stain Report Called to,Read Back By and Verified With: RN Kerby Moors [phone]  1517 HDAY    Report Status 08/08/2017 FINAL  Final  MRSA PCR Screening     Status: None   Collection Time: 08/08/17  4:41 PM  Result Value Ref Range Status   MRSA by PCR NEGATIVE NEGATIVE Final    Comment:        The GeneXpert MRSA Assay (FDA approved for NASAL specimens only), is one component of a comprehensive MRSA colonization surveillance program. It is not intended to diagnose MRSA infection nor to guide or monitor treatment for MRSA infections.   Respiratory Panel by PCR     Status: None   Collection Time: 08/08/17  4:46 PM  Result Value Ref Range Status   Adenovirus NOT DETECTED NOT DETECTED Final   Coronavirus 229E NOT DETECTED NOT DETECTED Final   Coronavirus HKU1 NOT DETECTED NOT DETECTED Final   Coronavirus NL63 NOT DETECTED NOT DETECTED Final   Coronavirus OC43 NOT DETECTED NOT DETECTED Final   Metapneumovirus NOT DETECTED NOT DETECTED Final   Rhinovirus / Enterovirus NOT DETECTED NOT DETECTED Final   Influenza A NOT DETECTED NOT DETECTED Final   Influenza B NOT DETECTED NOT DETECTED Final   Parainfluenza Virus 1 NOT DETECTED NOT DETECTED Final   Parainfluenza Virus 2 NOT DETECTED NOT DETECTED Final   Parainfluenza Virus 3 NOT DETECTED NOT DETECTED Final   Parainfluenza Virus 4 NOT DETECTED NOT DETECTED Final   Respiratory Syncytial Virus NOT DETECTED NOT DETECTED Final   Bordetella pertussis NOT DETECTED NOT DETECTED Final   Chlamydophila pneumoniae NOT DETECTED NOT DETECTED Final   Mycoplasma pneumoniae NOT DETECTED NOT DETECTED Final         Radiology Studies: Dg Chest Portable 1 View  Result Date: 08/08/2017 CLINICAL DATA:  Shortness of breath. EXAM: PORTABLE CHEST 1 VIEW COMPARISON:  Radiograph 05/28/2017.  CT 05/31/2017 FINDINGS: The lungs are hyperinflated. Unchanged heart size and mediastinal contours. Scattered calcified granulomas. No pulmonary edema, pleural effusion or focal airspace disease. No pneumothorax. IMPRESSION: Chronic hyperinflation.   No acute abnormality. Electronically Signed   By: Rubye Oaks M.D.   On: 08/08/2017 06:45  Scheduled Meds: . atorvastatin  10 mg Oral q1800  . diclofenac  75 mg Oral BID  . DULoxetine  60 mg Oral QAC breakfast  . enoxaparin (LOVENOX) injection  40 mg Subcutaneous Q24H  . famotidine  20 mg Oral Daily  . gabapentin  300 mg Oral TID  . guaiFENesin  1,200 mg Oral BID  . levothyroxine  200 mcg Oral QAC breakfast  . methylPREDNISolone (SOLU-MEDROL) injection  60 mg Intravenous Q8H  . mometasone-formoterol  2 puff Inhalation BID  . montelukast  10 mg Oral QHS  . pantoprazole  40 mg Oral Daily  . potassium chloride SA  20 mEq Oral Daily  . sodium chloride flush  3 mL Intravenous Q12H  . tamsulosin  0.4 mg Oral Daily  . tiZANidine  4 mg Oral BID   Continuous Infusions: . sodium chloride    . levofloxacin (LEVAQUIN) IV    . vancomycin       LOS: 1 day    Time spent: 40 minutes    WOODS, Roselind Messier, MD [facility] Pager [phone]   If 7PM-7AM, please contact night-coverage [email or web address] Password [email or web address] 08/09/2017, 8:00 AM

## 2017-08-09 NOTE — Progress Notes (Signed)
  Echocardiogram 2D Echocardiogram has been performed.  Manasvi Dickard L Androw 08/09/2017, 12:37 PM

## 2017-08-10 ENCOUNTER — Inpatient Hospital Stay (HOSPITAL_COMMUNITY): Payer: Medicare HMO

## 2017-08-10 DIAGNOSIS — N400 Enlarged prostate without lower urinary tract symptoms: Secondary | ICD-10-CM

## 2017-08-10 LAB — BASIC METABOLIC PANEL
ANION GAP: 11 (ref 5–15)
BUN: 22 mg/dL — AB (ref 6–20)
CALCIUM: 9.5 mg/dL (ref 8.9–10.3)
CO2: 33 mmol/L — ABNORMAL HIGH (ref 22–32)
Chloride: 94 mmol/L — ABNORMAL LOW (ref 101–111)
Creatinine, Ser: 0.79 mg/dL (ref 0.61–1.24)
GFR calc Af Amer: >60 mL/min (ref >60)
GLUCOSE: 207 mg/dL — AB (ref 65–99)
Potassium: 3.7 mmol/L (ref 3.5–5.1)
Sodium: 138 mmol/L (ref 135–145)

## 2017-08-10 LAB — CBC WITH DIFFERENTIAL/PLATELET
BASOS PCT: 0 %
Basophils Absolute: 0 10*3/uL (ref 0.0–0.1)
EOS ABS: 0 10*3/uL (ref 0.0–0.7)
Eosinophils Relative: 0 %
HCT: 38.7 % — ABNORMAL LOW (ref 39.0–52.0)
Hemoglobin: 11.8 g/dL — ABNORMAL LOW (ref 13.0–17.0)
LYMPHS ABS: 0.5 10*3/uL — AB (ref 0.7–4.0)
Lymphocytes Relative: 4 %
MCH: 28.9 pg (ref 26.0–34.0)
MCHC: 30.5 g/dL (ref 30.0–36.0)
MCV: 94.6 fL (ref 78.0–100.0)
MONO ABS: 0.5 10*3/uL (ref 0.1–1.0)
MONOS PCT: 4 %
Neutro Abs: 10.9 10*3/uL — ABNORMAL HIGH (ref 1.7–7.7)
Neutrophils Relative %: 92 %
Platelets: 183 10*3/uL (ref 150–400)
RBC: 4.09 MIL/uL — ABNORMAL LOW (ref 4.22–5.81)
RDW: 14.5 % (ref 11.5–15.5)
WBC: 11.9 10*3/uL — ABNORMAL HIGH (ref 4.0–10.5)

## 2017-08-10 LAB — LIPID PANEL
Cholesterol: 161 mg/dL (ref 0–200)
HDL: 38 mg/dL — ABNORMAL LOW (ref >40)
LDL Cholesterol: 79 mg/dL (ref 0–99)
TRIGLYCERIDES: 218 mg/dL — AB (ref <150)
Total CHOL/HDL Ratio: 4.2 RATIO
VLDL: 44 mg/dL — ABNORMAL HIGH (ref 0–40)

## 2017-08-10 LAB — LACTIC ACID, PLASMA: LACTIC ACID, VENOUS: 2.2 mmol/L — AB (ref 0.5–1.9)

## 2017-08-10 LAB — GLUCOSE, CAPILLARY
GLUCOSE-CAPILLARY: 202 mg/dL — AB (ref 65–99)
GLUCOSE-CAPILLARY: 212 mg/dL — AB (ref 65–99)
Glucose-Capillary: 234 mg/dL — ABNORMAL HIGH (ref 65–99)
Glucose-Capillary: 251 mg/dL — ABNORMAL HIGH (ref 65–99)

## 2017-08-10 LAB — MAGNESIUM: Magnesium: 2.2 mg/dL (ref 1.7–2.4)

## 2017-08-10 MED ORDER — ATORVASTATIN CALCIUM 40 MG PO TABS
40.0000 mg | ORAL_TABLET | Freq: Every day | ORAL | Status: DC
Start: 2017-08-11 — End: 2017-08-14
  Administered 2017-08-11 – 2017-08-13 (×3): 40 mg via ORAL
  Filled 2017-08-10 (×3): qty 1

## 2017-08-10 MED ORDER — LORAZEPAM 2 MG/ML IJ SOLN
1.0000 mg | Freq: Three times a day (TID) | INTRAMUSCULAR | Status: DC
  Administered 2017-08-10 – 2017-08-12 (×6): 1 mg via INTRAVENOUS
  Filled 2017-08-10 (×6): qty 1

## 2017-08-10 MED ORDER — FLUTICASONE PROPIONATE 50 MCG/ACT NA SUSP
2.0000 | Freq: Every day | NASAL | Status: DC
  Administered 2017-08-10 – 2017-08-14 (×4): 2 via NASAL
  Filled 2017-08-10 (×2): qty 16

## 2017-08-10 MED ORDER — SODIUM CHLORIDE 0.9 % IV SOLN
INTRAVENOUS | Status: DC
  Administered 2017-08-10 – 2017-08-11 (×2): via INTRAVENOUS

## 2017-08-10 NOTE — Plan of Care (Signed)
Problem: Education: Goal: Knowledge of [facility] Education information/materials will improve Outcome: Progressing Patient alert and oriented to hospital and unit. Discussed plan of care and patient states he is feeling much better.  Problem: Safety: Goal: Ability to remain free from injury will improve Outcome: Completed/Met Date Met: 08/10/17 Patient calls for assistance out of bed. Bed in low position, room uncluttered, and call light within reach.   Problem: Health Behavior/Discharge Planning: Goal: Ability to manage health-related needs will improve Outcome: Progressing Patient improving but still having labored breathing on exertion. Patient states he no longer uses a Cpap at night , though he must sleep sitting upright.   Problem: Pain Managment: Goal: General experience of comfort will improve Outcome: Progressing No complaints of pain on night shift.   Problem: Physical Regulation: Goal: Will remain free from infection Outcome: Progressing Receiving Vancomycin IVPB.  Problem: Skin Integrity: Goal: Risk for impaired skin integrity will decrease Outcome: Progressing Able to turn self. Foam to bottom, dressing to skin tears and TED hose in place to left lower extremity.   Problem: Activity: Goal: Will verbalize the importance of balancing activity with adequate rest periods Outcome: Progressing Patient takes breaks to catch his breath when changing position. From lying to sitting on side of bed, patient rested, prior to standing up.

## 2017-08-10 NOTE — Progress Notes (Signed)
PROGRESS NOTE    [name]  WUJ:[id number] DOB: 07-19-1951 DOA: 08/08/2017 PCP: Donita Brooks, MD   Brief Narrative:  66 y.o. WM PMHx Anxiety, Depression BPH, Chronic Respiratory failure, COPD, Chronic Diastolic CHF, HTN, HLD, Hypothyroid.    Approximately 03:00 on day of admission when patient awoke from sleep with a sensation of being unable to breathe. Patient attempted use his nebulizer without improvement. O2 saturations were at 81% on his baseline 4 L. EMS was called and patient was brought to the emergency room. Placed on BiPAP. Attempted to wean off BiPAP after approximately 2-1/2 hours without success. Patient endorses baseline intermittent productive cough and fluctuating lower extremity edema. Patient also states he felt "hot" when he awoke in respiratory distress. Denies dysuria, frequency, chest pain, palpitations, focal neurological deficit, rash.      Subjective: 8/29 A/O 4, negative CP, positive acute on chronic SOB, negative abdominal pain, negative N/V,   Assessment & Plan:   Active Problems:   Hypothyroidism   Essential hypertension   G E R D   Acute on chronic respiratory failure with hypoxia (HCC)   Chronic diastolic heart failure (HCC)   COPD GOLD II   Sepsis (HCC)   Sepsis unspecified organism -At admission criteria for sepsis temp> 38.6, HR> 90, RR> 20, lactic acid= 2.04 -Lactic acid had resolved but now increased to 2.2. Trend -Restart normal saline 121ml/hr. Monitor for fluid overload -Complete 7 days antibiotics   -Solu-Medrol 60 mg TID -Currently on 4 L O2 via [facility] (home regimen 4 L O2) -Normal saline 117ml/hr (lactic acidosis)     Acute on chronic respiratory failure with hypoxia/COPD exacerbation:  -On home O2 4 L -Solu-Medrol 60 mg TID -Xopenex QID -Dulera -Singulair -Mucinex -Induce sputum culture pending     Chronic Diastolic CHF -Echocardiogram January 2017, EF 55% grade 1 diastolic dysfunction -8/28 Echocardiogram: Not  helpful see results below -Strict in and out since admission +726ml -Daily weight Filed Weights   08/08/17 0656 08/08/17 1625 08/10/17 0336  Weight: 254 lb (115.2 kg) 258 lb 6.1 oz (117.2 kg) 259 lb 11.2 oz (117.8 kg)  -Hold Lasix, will assess daily: PRN -PCXR 8/29    Tachycardia:  -Most likely from nebulizers, steroids, hypoxia -Monitor closely -Lopressor PRN   Essential HTN -Currently normotensive hold BP medication   PreDiabetes  -8/27 Hemoglobin A1c = 6.2  -Restart metformin 500 mg BID   HLD: -Lipid panel not within ADA/AHA guidelines - 8/29 increase Lipitor 40 mg daily   GERD: - PPI -Home dose Carafate    Depression/: - Continue Cymbalta   Crhonic pain syndrome:  - Continue home medication   BPH: - Continue Flomax   Anxiety   - Ativan 1 mg TID anxiety/air hunger    DVT prophylaxis: Lovenox Code Status: Full Family Communication: Wife at bedside for discussion of plan of care Disposition Plan: TBD   Consultants:  None    Procedures/Significant Events:  8/28 Echocardiogram: LVEF=: 55% to 60%. Although no diagnostic regional wall motion abnormality   was identified, this possibility cannot be completely excluded on   the basis of this study. The study is not technically sufficient   to allow evaluation of LV diastolic function.    I have personally reviewed and interpreted all radiology studies and my findings are as above.  VENTILATOR SETTINGS:    Cultures 8/27 respiratory virus panel negative 8/27 HIV negative 8/27 blood NGTD  8/28 sputum reincubated    Antimicrobials: Anti-infectives    Start  Stop   08/09/17 0800  levofloxacin (LEVAQUIN) IVPB 750 mg         08/08/17 1800  vancomycin (VANCOCIN) IVPB 1000 mg/200 mL premix         08/08/17 0715  vancomycin (VANCOCIN) 2,000 mg in sodium chloride 0.9 % 500 mL IVPB     08/08/17 1008   08/08/17 0715  levofloxacin (LEVAQUIN) IVPB 750 mg  Status:  Discontinued     08/08/17 0713    08/08/17 0700  levofloxacin (LEVAQUIN) IVPB 750 mg     08/08/17 1000   08/08/17 0700  vancomycin (VANCOCIN) IVPB 1000 mg/200 mL premix  Status:  Discontinued     08/08/17 0713       Devices   LINES / TUBES:      Continuous Infusions: . sodium chloride    . levofloxacin (LEVAQUIN) IV 750 mg (08/10/17 0749)  . vancomycin Stopped (08/10/17 16100626)     Objective: Vitals:   08/10/17 0336 08/10/17 0344 08/10/17 0400 08/10/17 0738  BP:  131/69 131/69 (!) 156/89  Pulse:  61 61 89  Resp:   (!) 21 20  Temp:  97.9 F (36.6 C)  97.9 F (36.6 C)  TempSrc:  Oral  Oral  SpO2:  96% 96% 95%  Weight: 259 lb 11.2 oz (117.8 kg)     Height:        Intake/Output Summary (Last 24 hours) at 08/10/17 0758 Last data filed at 08/10/17 [id number]  Gross per 24 hour  Intake             1663 ml  Output             1875 ml  Net             -212 ml   Filed Weights   08/08/17 0656 08/08/17 1625 08/10/17 0336  Weight: 254 lb (115.2 kg) 258 lb 6.1 oz (117.2 kg) 259 lb 11.2 oz (117.8 kg)   Physical Exam:  General: A/O 4, positive acute on chronic respiratory distress Neck:  Negative scars, masses, torticollis, lymphadenopathy, JVD Lungs: tachypnea, diffuse poor air movement, diffuse expiratory wheezing without crackles Cardiovascular: Tachycardic, Regular rhythm without murmur gallop or rub normal S1 and S2 Abdomen: MORBIDLY OBESE, negative abdominal pain, positive distention (hernia), positive soft, bowel sounds, no rebound, no ascites, no appreciable mass Extremities: No significant cyanosis, clubbing, or edema bilateral lower extremities Skin: Negative rashes, lesions, ulcers Psychiatric:  Positive depression, positive anxiety, negative fatigue, negative mania  Central nervous system:  Cranial nerves II through XII intact, tongue/uvula midline, all extremities muscle strength 5/5, sensation intact throughout,negative dysarthria, negative expressive aphasia, negative receptive  aphasia.      Data Reviewed: Care during the described time interval was provided by me .  I have reviewed this patient's available data, including medical history, events of note, physical examination, and all test results as part of my evaluation.   CBC:  Recent Labs Lab 08/08/17 0642 08/09/17 0453 08/09/17 0835  WBC 10.2 12.2* 12.1*  NEUTROABS 8.3*  --   --   HGB 12.2* 11.7* 11.7*  HCT 40.0 38.3* 38.5*  MCV 96.2 95.3 94.6  PLT 158 171 183   Basic Metabolic Panel:  Recent Labs Lab 08/08/17 0642 08/08/17 0958 08/09/17 0453  NA 138  --  141  K 3.3*  --  4.0  CL 93*  --  94*  CO2 34*  --  39*  GLUCOSE 143*  --  185*  BUN 13  --  16  CREATININE 0.85  --  0.90  CALCIUM 8.9  --  9.8  MG  --  1.6*  --   PHOS  --  2.9  --    GFR: Estimated Creatinine Clearance: 96.9 mL/min (by C-G formula based on SCr of 0.9 mg/dL). Liver Function Tests:  Recent Labs Lab 08/08/17 0642  AST 20  ALT 29  ALKPHOS 77  BILITOT 0.7  PROT 6.2*  ALBUMIN 3.4*   No results for input(s): LIPASE, AMYLASE in the last 168 hours. No results for input(s): AMMONIA in the last 168 hours. Coagulation Profile:  Recent Labs Lab 08/08/17 0958  INR 1.04   Cardiac Enzymes:  Recent Labs Lab 08/08/17 0958 08/08/17 1558 08/08/17 2117  TROPONINI <0.03 <0.03 0.12*   BNP (last 3 results)  Recent Labs  05/27/17 1118  PROBNP 28.0   HbA1C:  Recent Labs  08/08/17 0958  HGBA1C 6.2*   CBG:  Recent Labs Lab 08/08/17 1209  GLUCAP 200*   Lipid Profile: No results for input(s): CHOL, HDL, LDLCALC, TRIG, CHOLHDL, LDLDIRECT in the last 72 hours. Thyroid Function Tests: No results for input(s): TSH, T4TOTAL, FREET4, T3FREE, THYROIDAB in the last 72 hours. Anemia Panel: No results for input(s): VITAMINB12, FOLATE, FERRITIN, TIBC, IRON, RETICCTPCT in the last 72 hours. Urine analysis:    Component Value Date/Time   COLORURINE YELLOW 08/08/2017 1200   APPEARANCEUR CLEAR 08/08/2017  1200   LABSPEC 1.016 08/08/2017 1200   PHURINE 5.0 08/08/2017 1200   GLUCOSEU NEGATIVE 08/08/2017 1200   HGBUR NEGATIVE 08/08/2017 1200   BILIRUBINUR NEGATIVE 08/08/2017 1200   KETONESUR NEGATIVE 08/08/2017 1200   PROTEINUR NEGATIVE 08/08/2017 1200   UROBILINOGEN 0.2 12/25/2010 2254   NITRITE NEGATIVE 08/08/2017 1200   LEUKOCYTESUR NEGATIVE 08/08/2017 1200   Sepsis Labs: @LABRCNTIP (procalcitonin:4,lacticidven:4)  ) Recent Results (from the past 240 hour(s))  Blood Culture (routine x 2)     Status: None (Preliminary result)   Collection Time: 08/08/17  7:13 AM  Result Value Ref Range Status   Specimen Description BLOOD RIGHT ANTECUBITAL  Final   Special Requests   Final    BOTTLES DRAWN AEROBIC AND ANAEROBIC Blood Culture adequate volume   Culture NO GROWTH 1 DAY  Final   Report Status PENDING  Incomplete  Blood Culture (routine x 2)     Status: None (Preliminary result)   Collection Time: 08/08/17  7:18 AM  Result Value Ref Range Status   Specimen Description BLOOD RIGHT HAND  Final   Special Requests   Final    BOTTLES DRAWN AEROBIC AND ANAEROBIC Blood Culture adequate volume   Culture NO GROWTH 1 DAY  Final   Report Status PENDING  Incomplete  Culture, sputum-assessment     Status: None   Collection Time: 08/08/17  1:42 PM  Result Value Ref Range Status   Specimen Description EXPECTORATED SPUTUM  Final   Special Requests NONE  Final   Sputum evaluation   Final    Sputum specimen not acceptable for testing.  Please recollect.   Gram Stain Report Called to,Read Back By and Verified With: RN Kerby Moors [phone] 1517 HDAY    Report Status 08/08/2017 FINAL  Final  MRSA PCR Screening     Status: None   Collection Time: 08/08/17  4:41 PM  Result Value Ref Range Status   MRSA by PCR NEGATIVE NEGATIVE Final    Comment:        The GeneXpert MRSA Assay (FDA approved for NASAL specimens only), is one component of  a comprehensive MRSA colonization surveillance program. It is  not intended to diagnose MRSA infection nor to guide or monitor treatment for MRSA infections.   Respiratory Panel by PCR     Status: None   Collection Time: 08/08/17  4:46 PM  Result Value Ref Range Status   Adenovirus NOT DETECTED NOT DETECTED Final   Coronavirus 229E NOT DETECTED NOT DETECTED Final   Coronavirus HKU1 NOT DETECTED NOT DETECTED Final   Coronavirus NL63 NOT DETECTED NOT DETECTED Final   Coronavirus OC43 NOT DETECTED NOT DETECTED Final   Metapneumovirus NOT DETECTED NOT DETECTED Final   Rhinovirus / Enterovirus NOT DETECTED NOT DETECTED Final   Influenza A NOT DETECTED NOT DETECTED Final   Influenza B NOT DETECTED NOT DETECTED Final   Parainfluenza Virus 1 NOT DETECTED NOT DETECTED Final   Parainfluenza Virus 2 NOT DETECTED NOT DETECTED Final   Parainfluenza Virus 3 NOT DETECTED NOT DETECTED Final   Parainfluenza Virus 4 NOT DETECTED NOT DETECTED Final   Respiratory Syncytial Virus NOT DETECTED NOT DETECTED Final   Bordetella pertussis NOT DETECTED NOT DETECTED Final   Chlamydophila pneumoniae NOT DETECTED NOT DETECTED Final   Mycoplasma pneumoniae NOT DETECTED NOT DETECTED Final  Culture, expectorated sputum-assessment     Status: None   Collection Time: 08/09/17 11:41 AM  Result Value Ref Range Status   Specimen Description INDUCED SPUTUM  Final   Special Requests NONE  Final   Sputum evaluation THIS SPECIMEN IS ACCEPTABLE FOR SPUTUM CULTURE  Final   Report Status 08/09/2017 FINAL  Final  Culture, respiratory (NON-Expectorated)     Status: None (Preliminary result)   Collection Time: 08/09/17 11:41 AM  Result Value Ref Range Status   Specimen Description INDUCED SPUTUM  Final   Special Requests NONE Reflexed from [id number]  Final   Gram Stain   Final    RARE SQUAMOUS EPITHELIAL CELLS PRESENT RARE WBC PRESENT,BOTH PMN AND MONONUCLEAR NO ORGANISMS SEEN    Culture PENDING  Incomplete   Report Status PENDING  Incomplete         Radiology Studies: Dg  Chest Port 1 View  Result Date: 08/10/2017 CLINICAL DATA:  Shortness of Breath EXAM: PORTABLE CHEST 1 VIEW COMPARISON:  08/08/2017 FINDINGS: Mild hyperinflation. Heart is borderline in size. Bibasilar atelectasis. No effusions or overt edema. No acute bony abnormality. IMPRESSION: Hyperinflation.  Bibasilar atelectasis. Electronically Signed   By: Charlett Nose M.D.   On: 08/10/2017 07:28        Scheduled Meds: . atorvastatin  10 mg Oral q1800  . diclofenac  75 mg Oral BID  . DULoxetine  60 mg Oral QAC breakfast  . enoxaparin (LOVENOX) injection  40 mg Subcutaneous Q24H  . famotidine  20 mg Oral Daily  . gabapentin  300 mg Oral TID  . guaiFENesin  1,200 mg Oral BID  . levalbuterol  1.25 mg Nebulization Q6H WA  . levothyroxine  200 mcg Oral QAC breakfast  . LORazepam  0.5-1 mg Intravenous TID  . methylPREDNISolone (SOLU-MEDROL) injection  60 mg Intravenous Q8H  . mometasone-formoterol  2 puff Inhalation BID  . montelukast  10 mg Oral QHS  . pantoprazole  40 mg Oral Daily  . potassium chloride SA  20 mEq Oral Daily  . sodium chloride flush  3 mL Intravenous Q12H  . sucralfate  1 g Oral TID WC & HS  . tamsulosin  0.4 mg Oral Daily  . tiZANidine  4 mg Oral BID   Continuous Infusions: . sodium chloride    .  levofloxacin (LEVAQUIN) IV 750 mg (08/10/17 0749)  . vancomycin Stopped (08/10/17 0626)     LOS: 2 days    Time spent: 40 minutes    WOODS, Roselind Messier, MD [facility] Pager [phone]   If 7PM-7AM, please contact night-coverage [email or web address] Password [facility] 08/10/2017, 7:58 AM

## 2017-08-10 NOTE — Plan of Care (Signed)
Problem: Activity: Goal: Risk for activity intolerance will decrease Outcome: Progressing Patient still dyspneic at rest but is able to transfer from bed to chair with minimal assistance and rest breaks.

## 2017-08-11 ENCOUNTER — Telehealth: Payer: Self-pay | Admitting: Internal Medicine

## 2017-08-11 DIAGNOSIS — R0602 Shortness of breath: Secondary | ICD-10-CM

## 2017-08-11 LAB — MAGNESIUM: Magnesium: 2.2 mg/dL (ref 1.7–2.4)

## 2017-08-11 LAB — CBC
HEMATOCRIT: 39.3 % (ref 39.0–52.0)
Hemoglobin: 11.8 g/dL — ABNORMAL LOW (ref 13.0–17.0)
MCH: 28.5 pg (ref 26.0–34.0)
MCHC: 30 g/dL (ref 30.0–36.0)
MCV: 94.9 fL (ref 78.0–100.0)
PLATELETS: 191 10*3/uL (ref 150–400)
RBC: 4.14 MIL/uL — ABNORMAL LOW (ref 4.22–5.81)
RDW: 14.3 % (ref 11.5–15.5)
WBC: 10.6 10*3/uL — AB (ref 4.0–10.5)

## 2017-08-11 LAB — BASIC METABOLIC PANEL
Anion gap: 8 (ref 5–15)
BUN: 19 mg/dL (ref 6–20)
CALCIUM: 9.3 mg/dL (ref 8.9–10.3)
CHLORIDE: 97 mmol/L — AB (ref 101–111)
CO2: 33 mmol/L — AB (ref 22–32)
CREATININE: 0.67 mg/dL (ref 0.61–1.24)
GFR calc non Af Amer: >60 mL/min (ref >60)
GLUCOSE: 205 mg/dL — AB (ref 65–99)
Potassium: 4.2 mmol/L (ref 3.5–5.1)
Sodium: 138 mmol/L (ref 135–145)

## 2017-08-11 LAB — BLOOD GAS, ARTERIAL
Acid-Base Excess: 8.7 mmol/L — ABNORMAL HIGH (ref 0.0–2.0)
Bicarbonate: 34.3 mmol/L — ABNORMAL HIGH (ref 20.0–28.0)
Drawn by: 51191
FIO2: 36
O2 CONTENT: 4 L/min
O2 SAT: 95.3 %
PCO2 ART: 63.2 mmHg — AB (ref 32.0–48.0)
PO2 ART: 82.2 mmHg — AB (ref 83.0–108.0)
Patient temperature: 98.6
pH, Arterial: 7.354 (ref 7.350–7.450)

## 2017-08-11 LAB — CULTURE, RESPIRATORY: CULTURE: NORMAL

## 2017-08-11 LAB — CULTURE, RESPIRATORY W GRAM STAIN

## 2017-08-11 LAB — GLUCOSE, CAPILLARY
GLUCOSE-CAPILLARY: 187 mg/dL — AB (ref 65–99)
Glucose-Capillary: 213 mg/dL — ABNORMAL HIGH (ref 65–99)
Glucose-Capillary: 239 mg/dL — ABNORMAL HIGH (ref 65–99)

## 2017-08-11 MED ORDER — FLUTICASONE PROPIONATE 50 MCG/ACT NA SUSP: 2.0000 | Freq: Every day | NASAL | Status: DC

## 2017-08-11 MED ORDER — LORATADINE 10 MG PO TABS
10.0000 mg | ORAL_TABLET | Freq: Every day | ORAL | Status: DC
  Administered 2017-08-11 – 2017-08-14 (×4): 10 mg via ORAL
  Filled 2017-08-11 (×4): qty 1

## 2017-08-11 MED ORDER — IPRATROPIUM BROMIDE 0.02 % IN SOLN
0.5000 mg | Freq: Four times a day (QID) | RESPIRATORY_TRACT | Status: DC
  Administered 2017-08-11 – 2017-08-13 (×5): 0.5 mg via RESPIRATORY_TRACT
  Filled 2017-08-11 (×6): qty 2.5

## 2017-08-11 MED ORDER — FUROSEMIDE 10 MG/ML IJ SOLN
80.0000 mg | Freq: Three times a day (TID) | INTRAMUSCULAR | Status: DC
  Administered 2017-08-11 – 2017-08-14 (×8): 80 mg via INTRAVENOUS
  Filled 2017-08-11 (×8): qty 8

## 2017-08-11 MED ORDER — LEVALBUTEROL HCL 0.63 MG/3ML IN NEBU
0.6300 mg | INHALATION_SOLUTION | RESPIRATORY_TRACT | Status: DC | PRN
  Administered 2017-08-14: 0.63 mg via RESPIRATORY_TRACT
  Filled 2017-08-11: qty 3

## 2017-08-11 NOTE — Care Management Note (Addendum)
Case Management Note  Patient Details  Name: [name] MRN: [id number] Date of Birth: 08-Feb-1951  Subjective/Objective:    From home with wife, presents with sepsis, pna, copd, chf, has home oxygen with [facility] 4 liters.  He has a rollator .  Patient states he will need a yonker suctioner at home and he needs [facility] to come out and look at concentrator and nebulizer machine, they are both making a lot of noise and the nebulizer is taking longer to work. NCM notified [facility] onsite rep , Lupita LeashDonna, she states she will send an email out concerning this and they will get back with patient.   [facility]                Action/Plan: NCM will follow for dc needs.  Expected Discharge Date:                  Expected Discharge Plan:     In-House Referral:     Discharge planning Services  CM Consult  Post Acute Care Choice:    Choice offered to:     DME Arranged:    DME Agency:     HH Arranged:    HH Agency:     Status of Service:  In process, will continue to follow  If discussed at Long Length of Stay Meetings, dates discussed:    Additional Comments:  [name], [name], RN 08/11/2017, 2:58 PM

## 2017-08-11 NOTE — Telephone Encounter (Signed)
Spoke with the pt's daughter Morrie Sheldonshley She is calling to inform MW that pt has been admitted due to increased SOB Will forward to him as [place]

## 2017-08-11 NOTE — Progress Notes (Signed)
Inpatient Diabetes Program Recommendations  AACE/ADA: New Consensus Statement on Inpatient Glycemic Control (2015)  Target Ranges:  Prepandial:   less than 140 mg/dL      Peak postprandial:   less than 180 mg/dL (1-2 hours)      Critically ill patients:  140 - 180 mg/dL   Lab Results  Component Value Date   GLUCAP 213 (H) 08/11/2017   HGBA1C 6.2 (H) 08/08/2017    Review of Glycemic Control Results for Samuel DakinUGH, Orian (MRN [id number]) as of 08/11/2017 12:01  Ref. Range 08/10/2017 07:37 08/10/2017 11:46 08/10/2017 17:00 08/10/2017 21:39 08/11/2017 08:51  Glucose-Capillary Latest Ref Range: 65 - 99 mg/dL 782202 (H) 956234 (H) 213212 (H) 251 (H) 213 (H)   Inpatient Diabetes Program Recommendations:  While on steroids, Please consider Novolog correction moderate scale tid + hs. Will follow.  Thank you, Billy FischerJudy E. Laelia Angelo, RN, MSN, CDE  Diabetes Coordinator Inpatient Glycemic Control Team Team Pager [phone] (8am-5pm) 08/11/2017 12:01 PM

## 2017-08-11 NOTE — Progress Notes (Signed)
[facility] TEAM 1 - Stepdown/ICU Deaaron Fulghum  MWU:[id number] DOB: 1951/11/07 DOA: 08/08/2017 PCP: Donita Brooks, MD    Brief Narrative:  66 y.o.M w/ a  Hx ofAnxiety, Depression, BPH, Chronic Respiratory failure, COPD, Chronic Diastolic CHF, HTN, HLD, and Hypothyroid who presented w/ the acute onset of SOB. Patient attempted use his nebulizer without improvement. O2 saturations were at 81% on his baseline 4 L.EMS was called and patient was brought to the ED where he was placed on BiPAP. Attempted to wean off BiPAP after approximately 2-1/2 hours without success.  Subjective: The patient is sitting up at the bedside exhibiting pursed lip breathing in clear respiratory distress.  He is conversant however and able to complete full sentences.  He reports that his breathing feels "worse than it has in a long time and doesn't seem to be getting better."  He denies chest pain nausea or vomiting.  He has noted significant swelling in both legs which are presently dangling in a recliner.  Assessment & Plan:  Sepsis v/s SIRS  Tm 102.8 this admit - has now defervesced - I do not suspect he has had a true systemic infection, but rather SIRS related to an exacervation of severe pulm disease  Acute on chronic respiratory failurewith hypoxia / COPD exacerbation  On home O2 at 4 L - continues to exhibit signif resp distress - cont maximal med tx - will ask Pulm to see during this hospital stay   Chronic Diastolic CHF TTE January 2017, EF 55% grade 1 diastolic dysfunction - TTE this admit w/o new information - baseline weights over the last year as low as 112kg - stop IVF and diurese as able   [facility] Weights   08/08/17 0656 08/08/17 1625 08/10/17 0336  Weight: 115.2 kg (254 lb) 117.2 kg (258 lb 6.1 oz) 117.8 kg (259 lb 11.2 oz)    Essential HTN BP above goal - follow w/ diuresis   PreDiabetes  8/27 A1c6.2 - follow CBG   HLD 8/29 increase Lipitor 40 mg daily    GERD  Depression  Crhonic painsyndrome: continue home medication  BPH Continue Flomax  Anxiety Ativan 1 mg TID anxiety/air hunger  DVT prophylaxis: lovenox  Code Status: FULL CODE Family Communication: no family present at time of exam  Disposition Plan: SDU  Consultants:  none  Procedures: 8/28 TTE - EF 55-60% - unable to comment on WMA or DD  Antimicrobials:  Levaquin 8/27 >  Objective: Blood pressure 137/80, pulse 86, temperature 98.6 F (37 C), temperature source Oral, resp. rate (!) 29, height 5\' 4"  (1.626 m), weight 117.8 kg (259 lb 11.2 oz), SpO2 93 %.  Intake/Output Summary (Last 24 hours) at 08/11/17 1430 Last data filed at 08/11/17 1400  Gross per 24 hour  Intake             3370 ml  Output             2725 ml  Net              645 ml   Filed Weights   08/08/17 0656 08/08/17 1625 08/10/17 0336  Weight: 115.2 kg (254 lb) 117.2 kg (258 lb 6.1 oz) 117.8 kg (259 lb 11.2 oz)    Examination: General: acute resp distress w/ pursed lip breathing - able to complete full sentences  Lungs: Very poor air movement throughout with active wheeze but no focal crackles Cardiovascular: Regular rate and rhythm without murmur gallop or rub normal S1 and  S2 Abdomen: Nontender, morbidly obese, soft, bowel sounds positive, no rebound, no ascites, no appreciable mass Extremities: 3+ B LE edema   CBC:  Recent Labs Lab 08/08/17 0642 08/09/17 0453 08/09/17 0835 08/10/17 0820 08/11/17 0300  WBC 10.2 12.2* 12.1* 11.9* 10.6*  NEUTROABS 8.3*  --   --  10.9*  --   HGB 12.2* 11.7* 11.7* 11.8* 11.8*  HCT 40.0 38.3* 38.5* 38.7* 39.3  MCV 96.2 95.3 94.6 94.6 94.9  PLT 158 171 183 183 191   Basic Metabolic Panel:  Recent Labs Lab 08/08/17 0642 08/08/17 0958 08/09/17 0453 08/10/17 0554 08/11/17 0300  NA 138  --  141 138 138  K 3.3*  --  4.0 3.7 4.2  CL 93*  --  94* 94* 97*  CO2 34*  --  39* 33* 33*  GLUCOSE 143*  --  185* 207* 205*  BUN 13  --  16 22*  19  CREATININE 0.85  --  0.90 0.79 0.67  CALCIUM 8.9  --  9.8 9.5 9.3  MG  --  1.6*  --  2.2 2.2  PHOS  --  2.9  --   --   --    GFR: Estimated Creatinine Clearance: 109 mL/min (by C-G formula based on SCr of 0.67 mg/dL).  Liver Function Tests:  Recent Labs Lab 08/08/17 0642  AST 20  ALT 29  ALKPHOS 77  BILITOT 0.7  PROT 6.2*  ALBUMIN 3.4*    Coagulation Profile:  Recent Labs Lab 08/08/17 0958  INR 1.04    Cardiac Enzymes:  Recent Labs Lab 08/08/17 0958 08/08/17 1558 08/08/17 2117  TROPONINI <0.03 <0.03 0.12*    HbA1C: Hgb A1c MFr Bld  Date/Time Value Ref Range Status  08/08/2017 09:58 AM 6.2 (H) 4.8 - 5.6 % Final    Comment:    (NOTE) Pre diabetes:          5.7%-6.4% Diabetes:              >6.4% Glycemic control for   <7.0% adults with diabetes   11/01/2016 12:43 PM 5.9 (H) <5.7 % Final    Comment:      For someone without known diabetes, a hemoglobin A1c value between 5.7% and 6.4% is consistent with prediabetes and should be confirmed with a follow-up test.   For someone with known diabetes, a value <7% indicates that their diabetes is well controlled. A1c targets should be individualized based on duration of diabetes, age, co-morbid conditions and other considerations.   This assay result is consistent with an increased risk of diabetes.   Currently, no consensus exists regarding use of hemoglobin A1c for diagnosis of diabetes in children.       CBG:  Recent Labs Lab 08/10/17 1146 08/10/17 1700 08/10/17 2139 08/11/17 0851 08/11/17 1214  GLUCAP 234* 212* 251* 213* 239*    Recent Results (from the past 240 hour(s))  Blood Culture (routine x 2)     Status: None (Preliminary result)   Collection Time: 08/08/17  7:13 AM  Result Value Ref Range Status   Specimen Description BLOOD RIGHT ANTECUBITAL  Final   Special Requests   Final    BOTTLES DRAWN AEROBIC AND ANAEROBIC Blood Culture adequate volume   Culture NO GROWTH 3 DAYS   Final   Report Status PENDING  Incomplete  Blood Culture (routine x 2)     Status: None (Preliminary result)   Collection Time: 08/08/17  7:18 AM  Result Value Ref Range Status  Specimen Description BLOOD RIGHT HAND  Final   Special Requests   Final    BOTTLES DRAWN AEROBIC AND ANAEROBIC Blood Culture adequate volume   Culture NO GROWTH 3 DAYS  Final   Report Status PENDING  Incomplete  Culture, sputum-assessment     Status: None   Collection Time: 08/08/17  1:42 PM  Result Value Ref Range Status   Specimen Description EXPECTORATED SPUTUM  Final   Special Requests NONE  Final   Sputum evaluation   Final    Sputum specimen not acceptable for testing.  Please recollect.   Gram Stain Report Called to,Read Back By and Verified With: RN Kerby Moors [phone] 1517 HDAY    Report Status 08/08/2017 FINAL  Final  MRSA PCR Screening     Status: None   Collection Time: 08/08/17  4:41 PM  Result Value Ref Range Status   MRSA by PCR NEGATIVE NEGATIVE Final    Comment:        The GeneXpert MRSA Assay (FDA approved for NASAL specimens only), is one component of a comprehensive MRSA colonization surveillance program. It is not intended to diagnose MRSA infection nor to guide or monitor treatment for MRSA infections.   Respiratory Panel by PCR     Status: None   Collection Time: 08/08/17  4:46 PM  Result Value Ref Range Status   Adenovirus NOT DETECTED NOT DETECTED Final   Coronavirus 229E NOT DETECTED NOT DETECTED Final   Coronavirus HKU1 NOT DETECTED NOT DETECTED Final   Coronavirus NL63 NOT DETECTED NOT DETECTED Final   Coronavirus OC43 NOT DETECTED NOT DETECTED Final   Metapneumovirus NOT DETECTED NOT DETECTED Final   Rhinovirus / Enterovirus NOT DETECTED NOT DETECTED Final   Influenza A NOT DETECTED NOT DETECTED Final   Influenza B NOT DETECTED NOT DETECTED Final   Parainfluenza Virus 1 NOT DETECTED NOT DETECTED Final   Parainfluenza Virus 2 NOT DETECTED NOT DETECTED Final    Parainfluenza Virus 3 NOT DETECTED NOT DETECTED Final   Parainfluenza Virus 4 NOT DETECTED NOT DETECTED Final   Respiratory Syncytial Virus NOT DETECTED NOT DETECTED Final   Bordetella pertussis NOT DETECTED NOT DETECTED Final   Chlamydophila pneumoniae NOT DETECTED NOT DETECTED Final   Mycoplasma pneumoniae NOT DETECTED NOT DETECTED Final  Culture, expectorated sputum-assessment     Status: None   Collection Time: 08/09/17 11:41 AM  Result Value Ref Range Status   Specimen Description INDUCED SPUTUM  Final   Special Requests NONE  Final   Sputum evaluation THIS SPECIMEN IS ACCEPTABLE FOR SPUTUM CULTURE  Final   Report Status 08/09/2017 FINAL  Final  Culture, respiratory (NON-Expectorated)     Status: None   Collection Time: 08/09/17 11:41 AM  Result Value Ref Range Status   Specimen Description INDUCED SPUTUM  Final   Special Requests NONE Reflexed from [id number]  Final   Gram Stain   Final    RARE SQUAMOUS EPITHELIAL CELLS PRESENT RARE WBC PRESENT,BOTH PMN AND MONONUCLEAR NO ORGANISMS SEEN    Culture Consistent with normal respiratory flora.  Final   Report Status 08/11/2017 FINAL  Final     Scheduled Meds: . atorvastatin  40 mg Oral q1800  . diclofenac  75 mg Oral BID  . DULoxetine  60 mg Oral QAC breakfast  . enoxaparin (LOVENOX) injection  40 mg Subcutaneous Q24H  . famotidine  20 mg Oral Daily  . fluticasone  2 spray Each Nare Daily  . gabapentin  300 mg Oral TID  .  guaiFENesin  1,200 mg Oral BID  . levalbuterol  1.25 mg Nebulization Q6H WA  . levothyroxine  200 mcg Oral QAC breakfast  . LORazepam  1 mg Intravenous TID  . methylPREDNISolone (SOLU-MEDROL) injection  60 mg Intravenous Q8H  . mometasone-formoterol  2 puff Inhalation BID  . montelukast  10 mg Oral QHS  . pantoprazole  40 mg Oral Daily  . potassium chloride SA  20 mEq Oral Daily  . sodium chloride flush  3 mL Intravenous Q12H  . sucralfate  1 g Oral TID WC & HS  . tamsulosin  0.4 mg Oral Daily  .  tiZANidine  4 mg Oral BID     LOS: 3 days   Lonia BloodJeffrey T. Talor Desrosiers, MD [facility] Office  [phone] Pager - Text Page per Amion as per below:  On-Call/Text Page:      [email or web address]      password [email or web address]  If 7PM-7AM, please contact night-coverage [email or web address] Password [email or web address] 08/11/2017, 2:30 PM

## 2017-08-12 LAB — BASIC METABOLIC PANEL
ANION GAP: 9 (ref 5–15)
BUN: 21 mg/dL — ABNORMAL HIGH (ref 6–20)
CHLORIDE: 93 mmol/L — AB (ref 101–111)
CO2: 37 mmol/L — ABNORMAL HIGH (ref 22–32)
Calcium: 9.1 mg/dL (ref 8.9–10.3)
Creatinine, Ser: 0.69 mg/dL (ref 0.61–1.24)
GFR calc Af Amer: >60 mL/min (ref >60)
Glucose, Bld: 213 mg/dL — ABNORMAL HIGH (ref 65–99)
POTASSIUM: 3.9 mmol/L (ref 3.5–5.1)
SODIUM: 139 mmol/L (ref 135–145)

## 2017-08-12 LAB — CBC
HEMATOCRIT: 40.6 % (ref 39.0–52.0)
HEMOGLOBIN: 12.4 g/dL — AB (ref 13.0–17.0)
MCH: 29 pg (ref 26.0–34.0)
MCHC: 30.5 g/dL (ref 30.0–36.0)
MCV: 95.1 fL (ref 78.0–100.0)
Platelets: 199 10*3/uL (ref 150–400)
RBC: 4.27 MIL/uL (ref 4.22–5.81)
RDW: 14.3 % (ref 11.5–15.5)
WBC: 8.7 10*3/uL (ref 4.0–10.5)

## 2017-08-12 LAB — MAGNESIUM: MAGNESIUM: 2.3 mg/dL (ref 1.7–2.4)

## 2017-08-12 LAB — GLUCOSE, CAPILLARY
GLUCOSE-CAPILLARY: 222 mg/dL — AB (ref 65–99)
GLUCOSE-CAPILLARY: 194 mg/dL — AB (ref 65–99)
GLUCOSE-CAPILLARY: 196 mg/dL — AB (ref 65–99)
Glucose-Capillary: 283 mg/dL — ABNORMAL HIGH (ref 65–99)

## 2017-08-12 MED ORDER — HYDROCODONE-ACETAMINOPHEN 10-325 MG PO TABS
1.0000 | ORAL_TABLET | Freq: Four times a day (QID) | ORAL | Status: DC
  Administered 2017-08-12 – 2017-08-14 (×8): 1 via ORAL
  Filled 2017-08-12 (×8): qty 1

## 2017-08-12 MED ORDER — INSULIN ASPART 100 UNIT/ML ~~LOC~~ SOLN
0.0000 [IU] | Freq: Three times a day (TID) | SUBCUTANEOUS | Status: DC
  Administered 2017-08-13: 1 [IU] via SUBCUTANEOUS
  Administered 2017-08-13: 2 [IU] via SUBCUTANEOUS
  Administered 2017-08-13: 3 [IU] via SUBCUTANEOUS
  Administered 2017-08-14: 2 [IU] via SUBCUTANEOUS

## 2017-08-12 MED ORDER — INSULIN ASPART 100 UNIT/ML ~~LOC~~ SOLN
0.0000 [IU] | Freq: Every day | SUBCUTANEOUS | Status: DC
  Administered 2017-08-13: 3 [IU] via SUBCUTANEOUS

## 2017-08-12 MED ORDER — METHYLPREDNISOLONE SODIUM SUCC 40 MG IJ SOLR
40.0000 mg | Freq: Two times a day (BID) | INTRAMUSCULAR | Status: AC
  Administered 2017-08-13 (×3): 40 mg via INTRAVENOUS
  Filled 2017-08-12 (×3): qty 1

## 2017-08-12 MED ORDER — LORAZEPAM 1 MG PO TABS
1.0000 mg | ORAL_TABLET | Freq: Three times a day (TID) | ORAL | Status: DC
  Administered 2017-08-12 – 2017-08-14 (×6): 1 mg via ORAL
  Filled 2017-08-12 (×6): qty 1

## 2017-08-12 NOTE — Plan of Care (Signed)
Problem: Physical Regulation: Goal: Ability to maintain clinical measurements within normal limits will improve Outcome: Progressing Work of breathing is markedly decreased today.

## 2017-08-12 NOTE — Progress Notes (Signed)
[facility] TEAM 1 - Stepdown/ICU Merlyn AlbertEAM  Jamesen [name]  WUJ:[id number] DOB: [birth year] DOA: 08/08/2017 PCP: Donita BrooksPickard, Warren T, MD    Brief Narrative:  66 y.o.M w/ a  Hx ofAnxiety, Depression, BPH, Chronic Respiratory failure, COPD, Chronic Diastolic CHF, HTN, HLD, and Hypothyroid who presented w/ the acute onset of SOB. Patient attempted to use his nebulizer without improvement. O2 saturations were at 81% on his baseline 4 L.EMS was called and patient was brought to the ED where he was placed on BiPAP. Attempted to wean off BiPAP after approximately 2-1/2 hours without success.  Subjective: The pt is resting comfortably in a bedside chair.  He states he feels somewhat better than yesterday.  He denies cp, n/v, or abdom pain.    Assessment & Plan:  Sepsis v/s SIRS  Tm 102.8 this admit - has now defervesced - I do not suspect he has had a true systemic infection, but rather SIRS related to an exacervation of severe pulm disease  Acute on chronic respiratory failurewith hypoxia / COPD exacerbation  On home O2 at 4 L - cont maximal med tx - will ask Pulm to see during this hospital stay - slowly improving   Chronic Diastolic CHF TTE January 2017, EF 55% grade 1 diastolic dysfunction - TTE this admit w/o new information - baseline weights over the last year as low as 112kg - cont to diurese   Filed Weights   08/08/17 1625 08/10/17 0336 08/12/17 1254  Weight: 117.2 kg (258 lb 6.1 oz) 117.8 kg (259 lb 11.2 oz) 116.8 kg (257 lb 9.6 oz)    Essential HTN BP improved w/ diuresis - follow   PreDiabetes  8/27 A1c6.2 - CBG poorly controlled on steroid tx  - wean steroids   HLD Cont Lipitor at increased dose   GERD  Depression  Crhonic painsyndrome: continue home medication  BPH Continue Flomax  Anxiety Ativan 1 mg TID anxiety/air hunger  DVT prophylaxis: lovenox  Code Status: FULL CODE Family Communication: no family present at time of exam  Disposition Plan:  SDU  Consultants:  none  Procedures: 8/28 TTE - EF 55-60% - unable to comment on WMA or DD  Antimicrobials:  Levaquin 8/27 >   Objective: Blood pressure 114/76, pulse (!) 34, temperature 98.5 F (36.9 C), temperature source Oral, resp. rate 17, height 5\' 4"  (1.626 m), weight 116.8 kg (257 lb 9.6 oz), SpO2 (!) 89 %.  Intake/Output Summary (Last 24 hours) at 08/12/17 1404 Last data filed at 08/12/17 1244  Gross per 24 hour  Intake              750 ml  Output             4850 ml  Net            -4100 ml   Filed Weights   08/08/17 1625 08/10/17 0336 08/12/17 1254  Weight: 117.2 kg (258 lb 6.1 oz) 117.8 kg (259 lb 11.2 oz) 116.8 kg (257 lb 9.6 oz)    Examination: General: much less sob today, but not yet back to baseline   Lungs: Very poor air movement throughout - no wheezing at time of exam today  Cardiovascular: Regular rate and rhythm without murmur - distant HS th/o  Abdomen: Nontender, morbidly obese, soft, bowel sounds positive, no rebound Extremities: 3+ B LE edema   CBC:  Recent Labs Lab 08/08/17 0642 08/09/17 0453 08/09/17 0835 08/10/17 0820 08/11/17 0300 08/12/17 0339  WBC 10.2 12.2* 12.1* 11.9* 10.6*  8.7  NEUTROABS 8.3*  --   --  10.9*  --   --   HGB 12.2* 11.7* 11.7* 11.8* 11.8* 12.4*  HCT 40.0 38.3* 38.5* 38.7* 39.3 40.6  MCV 96.2 95.3 94.6 94.6 94.9 95.1  PLT 158 171 183 183 191 199   Basic Metabolic Panel:  Recent Labs Lab 08/08/17 0642 08/08/17 0958 08/09/17 0453 08/10/17 0554 08/11/17 0300 08/12/17 0339  NA 138  --  141 138 138 139  K 3.3*  --  4.0 3.7 4.2 3.9  CL 93*  --  94* 94* 97* 93*  CO2 34*  --  39* 33* 33* 37*  GLUCOSE 143*  --  185* 207* 205* 213*  BUN 13  --  16 22* 19 21*  CREATININE 0.85  --  0.90 0.79 0.67 0.69  CALCIUM 8.9  --  9.8 9.5 9.3 9.1  MG  --  1.6*  --  2.2 2.2 2.3  PHOS  --  2.9  --   --   --   --    GFR: Estimated Creatinine Clearance: 108.5 mL/min (by C-G formula based on SCr of 0.69 mg/dL).  Liver  Function Tests:  Recent Labs Lab 08/08/17 0642  AST 20  ALT 29  ALKPHOS 77  BILITOT 0.7  PROT 6.2*  ALBUMIN 3.4*    Coagulation Profile:  Recent Labs Lab 08/08/17 0958  INR 1.04    Cardiac Enzymes:  Recent Labs Lab 08/08/17 0958 08/08/17 1558 08/08/17 2117  TROPONINI <0.03 <0.03 0.12*    HbA1C: Hgb A1c MFr Bld  Date/Time Value Ref Range Status  08/08/2017 09:58 AM 6.2 (H) 4.8 - 5.6 % Final    Comment:    (NOTE) Pre diabetes:          5.7%-6.4% Diabetes:              >6.4% Glycemic control for   <7.0% adults with diabetes   11/01/2016 12:43 PM 5.9 (H) <5.7 % Final    Comment:      For someone without known diabetes, a hemoglobin A1c value between 5.7% and 6.4% is consistent with prediabetes and should be confirmed with a follow-up test.   For someone with known diabetes, a value <7% indicates that their diabetes is well controlled. A1c targets should be individualized based on duration of diabetes, age, co-morbid conditions and other considerations.   This assay result is consistent with an increased risk of diabetes.   Currently, no consensus exists regarding use of hemoglobin A1c for diagnosis of diabetes in children.       CBG:  Recent Labs Lab 08/11/17 0851 08/11/17 1214 08/11/17 1659 08/12/17 0730 08/12/17 1209  GLUCAP 213* 239* 187* 222* 283*    Recent Results (from the past 240 hour(s))  Blood Culture (routine x 2)     Status: None (Preliminary result)   Collection Time: 08/08/17  7:13 AM  Result Value Ref Range Status   Specimen Description BLOOD RIGHT ANTECUBITAL  Final   Special Requests   Final    BOTTLES DRAWN AEROBIC AND ANAEROBIC Blood Culture adequate volume   Culture NO GROWTH 3 DAYS  Final   Report Status PENDING  Incomplete  Blood Culture (routine x 2)     Status: None (Preliminary result)   Collection Time: 08/08/17  7:18 AM  Result Value Ref Range Status   Specimen Description BLOOD RIGHT HAND  Final   Special  Requests   Final    BOTTLES DRAWN AEROBIC AND ANAEROBIC Blood  Culture adequate volume   Culture NO GROWTH 3 DAYS  Final   Report Status PENDING  Incomplete  Culture, sputum-assessment     Status: None   Collection Time: 08/08/17  1:42 PM  Result Value Ref Range Status   Specimen Description EXPECTORATED SPUTUM  Final   Special Requests NONE  Final   Sputum evaluation   Final    Sputum specimen not acceptable for testing.  Please recollect.   Gram Stain Report Called to,Read Back By and Verified With: RN Kerby Moors [phone] 1517 HDAY    Report Status 08/08/2017 FINAL  Final  MRSA PCR Screening     Status: None   Collection Time: 08/08/17  4:41 PM  Result Value Ref Range Status   MRSA by PCR NEGATIVE NEGATIVE Final    Comment:        The GeneXpert MRSA Assay (FDA approved for NASAL specimens only), is one component of a comprehensive MRSA colonization surveillance program. It is not intended to diagnose MRSA infection nor to guide or monitor treatment for MRSA infections.   Respiratory Panel by PCR     Status: None   Collection Time: 08/08/17  4:46 PM  Result Value Ref Range Status   Adenovirus NOT DETECTED NOT DETECTED Final   Coronavirus 229E NOT DETECTED NOT DETECTED Final   Coronavirus HKU1 NOT DETECTED NOT DETECTED Final   Coronavirus NL63 NOT DETECTED NOT DETECTED Final   Coronavirus OC43 NOT DETECTED NOT DETECTED Final   Metapneumovirus NOT DETECTED NOT DETECTED Final   Rhinovirus / Enterovirus NOT DETECTED NOT DETECTED Final   Influenza A NOT DETECTED NOT DETECTED Final   Influenza B NOT DETECTED NOT DETECTED Final   Parainfluenza Virus 1 NOT DETECTED NOT DETECTED Final   Parainfluenza Virus 2 NOT DETECTED NOT DETECTED Final   Parainfluenza Virus 3 NOT DETECTED NOT DETECTED Final   Parainfluenza Virus 4 NOT DETECTED NOT DETECTED Final   Respiratory Syncytial Virus NOT DETECTED NOT DETECTED Final   Bordetella pertussis NOT DETECTED NOT DETECTED Final   Chlamydophila  pneumoniae NOT DETECTED NOT DETECTED Final   Mycoplasma pneumoniae NOT DETECTED NOT DETECTED Final  Culture, expectorated sputum-assessment     Status: None   Collection Time: 08/09/17 11:41 AM  Result Value Ref Range Status   Specimen Description INDUCED SPUTUM  Final   Special Requests NONE  Final   Sputum evaluation THIS SPECIMEN IS ACCEPTABLE FOR SPUTUM CULTURE  Final   Report Status 08/09/2017 FINAL  Final  Culture, respiratory (NON-Expectorated)     Status: None   Collection Time: 08/09/17 11:41 AM  Result Value Ref Range Status   Specimen Description INDUCED SPUTUM  Final   Special Requests NONE Reflexed from [id number]  Final   Gram Stain   Final    RARE SQUAMOUS EPITHELIAL CELLS PRESENT RARE WBC PRESENT,BOTH PMN AND MONONUCLEAR NO ORGANISMS SEEN    Culture Consistent with normal respiratory flora.  Final   Report Status 08/11/2017 FINAL  Final     Scheduled Meds: . atorvastatin  40 mg Oral q1800  . diclofenac  75 mg Oral BID  . DULoxetine  60 mg Oral QAC breakfast  . enoxaparin (LOVENOX) injection  40 mg Subcutaneous Q24H  . famotidine  20 mg Oral Daily  . fluticasone  2 spray Each Nare Daily  . furosemide  80 mg Intravenous Q8H  . gabapentin  300 mg Oral TID  . guaiFENesin  1,200 mg Oral BID  . ipratropium  0.5 mg Nebulization Q6H  .  levalbuterol  1.25 mg Nebulization Q6H WA  . levothyroxine  200 mcg Oral QAC breakfast  . loratadine  10 mg Oral Daily  . LORazepam  1 mg Intravenous TID  . methylPREDNISolone (SOLU-MEDROL) injection  60 mg Intravenous Q8H  . mometasone-formoterol  2 puff Inhalation BID  . montelukast  10 mg Oral QHS  . pantoprazole  40 mg Oral Daily  . potassium chloride SA  20 mEq Oral Daily  . sucralfate  1 g Oral TID WC & HS  . tamsulosin  0.4 mg Oral Daily  . tiZANidine  4 mg Oral BID     LOS: 4 days   Lonia Blood, MD [facility] Office  [phone] Pager - Text Page per Amion as per below:  On-Call/Text Page:       [email or web address]      password [email or web address]  If 7PM-7AM, please contact night-coverage [email or web address] Password [email or web address] 08/12/2017, 2:04 PM

## 2017-08-12 NOTE — Progress Notes (Signed)
Inpatient Diabetes Program Recommendations  AACE/ADA: New Consensus Statement on Inpatient Glycemic Control (2015)  Target Ranges:  Prepandial:   less than 140 mg/dL      Peak postprandial:   less than 180 mg/dL (1-2 hours)      Critically ill patients:  140 - 180 mg/dL   Lab Results  Component Value Date   GLUCAP 222 (H) 08/12/2017   HGBA1C 6.2 (H) 08/08/2017    Review of Glycemic Control Results for Evangeline DakinUGH, Kiara (MRN [id number]) as of 08/12/2017 11:33  Ref. Range 08/10/2017 21:39 08/11/2017 08:51 08/11/2017 12:14 08/11/2017 16:59 08/12/2017 07:30  Glucose-Capillary Latest Ref Range: 65 - 99 mg/dL 409251 (H) 811213 (H) 914239 (H) 187 (H) 222 (H)    Inpatient Diabetes Program Recommendations:  While on steroids, Please consider Novolog correction moderate scale tid + hs.  Thank you, Billy FischerJudy E. Lorann Tani, RN, MSN, CDE  Diabetes Coordinator Inpatient Glycemic Control Team Team Pager [phone] (8am-5pm) 08/12/2017 11:34 AM

## 2017-08-13 ENCOUNTER — Other Ambulatory Visit: Payer: Self-pay | Admitting: Internal Medicine

## 2017-08-13 LAB — BASIC METABOLIC PANEL
Anion gap: 9 (ref 5–15)
BUN: 28 mg/dL — ABNORMAL HIGH (ref 6–20)
CALCIUM: 9.2 mg/dL (ref 8.9–10.3)
CO2: 38 mmol/L — AB (ref 22–32)
CREATININE: 0.88 mg/dL (ref 0.61–1.24)
Chloride: 90 mmol/L — ABNORMAL LOW (ref 101–111)
GFR calc non Af Amer: >60 mL/min (ref >60)
Glucose, Bld: 219 mg/dL — ABNORMAL HIGH (ref 65–99)
Potassium: 4.2 mmol/L (ref 3.5–5.1)
Sodium: 137 mmol/L (ref 135–145)

## 2017-08-13 LAB — CULTURE, BLOOD (ROUTINE X 2)
Culture: NO GROWTH
Culture: NO GROWTH
Special Requests: ADEQUATE
Special Requests: ADEQUATE

## 2017-08-13 LAB — GLUCOSE, CAPILLARY
GLUCOSE-CAPILLARY: 219 mg/dL — AB (ref 65–99)
GLUCOSE-CAPILLARY: 141 mg/dL — AB (ref 65–99)
Glucose-Capillary: 197 mg/dL — ABNORMAL HIGH (ref 65–99)
Glucose-Capillary: 293 mg/dL — ABNORMAL HIGH (ref 65–99)

## 2017-08-13 LAB — CBC
HEMATOCRIT: 42.5 % (ref 39.0–52.0)
HEMOGLOBIN: 13.3 g/dL (ref 13.0–17.0)
MCH: 28.9 pg (ref 26.0–34.0)
MCHC: 31.3 g/dL (ref 30.0–36.0)
MCV: 92.4 fL (ref 78.0–100.0)
Platelets: 196 10*3/uL (ref 150–400)
RBC: 4.6 MIL/uL (ref 4.22–5.81)
RDW: 14 % (ref 11.5–15.5)
WBC: 9.3 10*3/uL (ref 4.0–10.5)

## 2017-08-13 LAB — MAGNESIUM: Magnesium: 2.5 mg/dL — ABNORMAL HIGH (ref 1.7–2.4)

## 2017-08-13 MED ORDER — LEVALBUTEROL HCL 1.25 MG/0.5ML IN NEBU
1.2500 mg | INHALATION_SOLUTION | Freq: Three times a day (TID) | RESPIRATORY_TRACT | Status: DC
  Administered 2017-08-14: 1.25 mg via RESPIRATORY_TRACT
  Filled 2017-08-13 (×2): qty 0.5

## 2017-08-13 MED ORDER — IPRATROPIUM BROMIDE 0.02 % IN SOLN
0.5000 mg | Freq: Three times a day (TID) | RESPIRATORY_TRACT | Status: DC
  Administered 2017-08-14 (×2): 0.5 mg via RESPIRATORY_TRACT
  Filled 2017-08-13 (×2): qty 2.5

## 2017-08-13 MED ORDER — IPRATROPIUM BROMIDE 0.02 % IN SOLN
0.5000 mg | Freq: Three times a day (TID) | RESPIRATORY_TRACT | Status: DC
  Administered 2017-08-13 (×2): 0.5 mg via RESPIRATORY_TRACT
  Filled 2017-08-13 (×2): qty 2.5

## 2017-08-13 MED ORDER — LEVALBUTEROL HCL 1.25 MG/0.5ML IN NEBU
1.2500 mg | INHALATION_SOLUTION | Freq: Three times a day (TID) | RESPIRATORY_TRACT | Status: DC
  Administered 2017-08-13 (×2): 1.25 mg via RESPIRATORY_TRACT
  Filled 2017-08-13 (×2): qty 0.5

## 2017-08-13 MED ORDER — PREDNISONE 20 MG PO TABS
40.0000 mg | ORAL_TABLET | Freq: Every day | ORAL | Status: DC
  Administered 2017-08-14: 40 mg via ORAL
  Filled 2017-08-13: qty 2

## 2017-08-13 NOTE — Progress Notes (Signed)
[facility] TEAM 1 - Stepdown/ICU [place]  NWG:[id number] DOB: 1951-05-27 DOA: 08/08/2017 PCP: Donita Brooks, MD    Brief Narrative:  66 y.o.M w/ a  Hx ofAnxiety, Depression, BPH, Chronic Respiratory failure, COPD, Chronic Diastolic CHF, HTN, HLD, and Hypothyroid who presented w/ the acute onset of SOB. Patient attempted to use his nebulizer without improvement. O2 saturations were at 81% on his baseline 4 L.EMS was called and patient was brought to the ED where he was placed on BiPAP. Attempted to wean off BiPAP after approximately 2-1/2 hours without success.  Subjective: Patient reports that he continues to make progress and is feeling better today than he did yesterday.  He denies chest pain nausea vomiting or abdominal pain.  He has not yet ambulated to any significant extent.  Assessment & Plan:  SIRS  Tm 102.8 this admit but no fever since that one reading - I do not suspect he has had a true systemic infection, but rather SIRS related to an exacerbation of severe pulm disease  Acute on chronic respiratory failurewith hypoxia / COPD exacerbation  On home O2 at 4 L - slowly improving - attempt to wean tx back toward home maintenance regimen   Chronic Diastolic CHF TTE January 2017, EF 55% grade 1 diastolic dysfunction - TTE this admit w/o new information - baseline weights over the last year as low as 112kg - cont to diurese - net negative ~5L thus far   [employer]   08/10/17 0336 08/12/17 1254 08/13/17 0500  Weight: 117.8 kg (259 lb 11.2 oz) 116.8 kg (257 lb 9.6 oz) 117.8 kg (259 lb 9.6 oz)    Essential HTN BP improved w/ diuresis  PreDiabetes  8/27 A1c6.2 - CBG improving w/ weaning of steroids   HLD Cont Lipitor at increased dose   GERD  Depression  Crhonic painsyndrome: continue home medication  BPH Continue Flomax  Anxiety Cont ativan prn as per home regimen  DVT prophylaxis: lovenox  Code Status: FULL CODE Family  Communication: spoke w/ pt and wife at bedside  Disposition Plan: transfer to tele bed - cont to diurese - wean steroids - mobilize   Consultants:  none  Procedures: 8/28 TTE - EF 55-60% - unable to comment on WMA or DD  Antimicrobials:  Levaquin 8/27 >   Objective: Blood pressure (!) 144/76, pulse (!) 43, temperature 98.4 F (36.9 C), temperature source Oral, resp. rate (!) 21, height 5\' 4"  (1.626 m), weight 117.8 kg (259 lb 9.6 oz), SpO2 (!) 88 %.  Intake/Output Summary (Last 24 hours) at 08/13/17 1026 Last data filed at 08/13/17 0800  Gross per 24 hour  Intake              630 ml  Output             3025 ml  Net            -2395 ml   Filed Weights   08/10/17 0336 08/12/17 1254 08/13/17 0500  Weight: 117.8 kg (259 lb 11.2 oz) 116.8 kg (257 lb 9.6 oz) 117.8 kg (259 lb 9.6 oz)    Examination: General: no acute resp distress  Lungs: Very poor air movement throughout but w/ no wheezing  Cardiovascular: RRR - no M or rub  Abdomen: Nontender, morbidly obese, soft, bowel sounds positive, no rebound - ventral hernia  Extremities: 2+ B LE edema   CBC:  Recent Labs Lab 08/08/17 0642  08/09/17 0835 08/10/17 0820 08/11/17 0300 08/12/17  1610 08/13/17 0558  WBC 10.2  < > 12.1* 11.9* 10.6* 8.7 9.3  NEUTROABS 8.3*  --   --  10.9*  --   --   --   HGB 12.2*  < > 11.7* 11.8* 11.8* 12.4* 13.3  HCT 40.0  < > 38.5* 38.7* 39.3 40.6 42.5  MCV 96.2  < > 94.6 94.6 94.9 95.1 92.4  PLT 158  < > 183 183 191 199 196  < > = values in this interval not displayed. Basic Metabolic Panel:  Recent Labs Lab 08/08/17 0958 08/09/17 0453 08/10/17 0554 08/11/17 0300 08/12/17 0339 08/13/17 0558  NA  --  141 138 138 139 137  K  --  4.0 3.7 4.2 3.9 4.2  CL  --  94* 94* 97* 93* 90*  CO2  --  39* 33* 33* 37* 38*  GLUCOSE  --  185* 207* 205* 213* 219*  BUN  --  16 22* 19 21* 28*  CREATININE  --  0.90 0.79 0.67 0.69 0.88  CALCIUM  --  9.8 9.5 9.3 9.1 9.2  MG 1.6*  --  2.2 2.2 2.3 2.5*  PHOS  2.9  --   --   --   --   --    GFR: Estimated Creatinine Clearance: 99.1 mL/min (by C-G formula based on SCr of 0.88 mg/dL).  Liver Function Tests:  Recent Labs Lab 08/08/17 0642  AST 20  ALT 29  ALKPHOS 77  BILITOT 0.7  PROT 6.2*  ALBUMIN 3.4*    Coagulation Profile:  Recent Labs Lab 08/08/17 0958  INR 1.04    Cardiac Enzymes:  Recent Labs Lab 08/08/17 0958 08/08/17 1558 08/08/17 2117  TROPONINI <0.03 <0.03 0.12*    HbA1C: Hgb A1c MFr Bld  Date/Time Value Ref Range Status  08/08/2017 09:58 AM 6.2 (H) 4.8 - 5.6 % Final    Comment:    (NOTE) Pre diabetes:          5.7%-6.4% Diabetes:              >6.4% Glycemic control for   <7.0% adults with diabetes   11/01/2016 12:43 PM 5.9 (H) <5.7 % Final    Comment:      For someone without known diabetes, a hemoglobin A1c value between 5.7% and 6.4% is consistent with prediabetes and should be confirmed with a follow-up test.   For someone with known diabetes, a value <7% indicates that their diabetes is well controlled. A1c targets should be individualized based on duration of diabetes, age, co-morbid conditions and other considerations.   This assay result is consistent with an increased risk of diabetes.   Currently, no consensus exists regarding use of hemoglobin A1c for diagnosis of diabetes in children.       CBG:  Recent Labs Lab 08/12/17 0730 08/12/17 1209 08/12/17 1634 08/12/17 2242 08/13/17 0736  GLUCAP 222* 283* 194* 196* 197*    Recent Results (from the past 240 hour(s))  Blood Culture (routine x 2)     Status: None   Collection Time: 08/08/17  7:13 AM  Result Value Ref Range Status   Specimen Description BLOOD RIGHT ANTECUBITAL  Final   Special Requests   Final    BOTTLES DRAWN AEROBIC AND ANAEROBIC Blood Culture adequate volume   Culture NO GROWTH 5 DAYS  Final   Report Status 08/13/2017 FINAL  Final  Blood Culture (routine x 2)     Status: None   Collection Time: 08/08/17   7:18 AM  Result Value Ref Range Status   Specimen Description BLOOD RIGHT HAND  Final   Special Requests   Final    BOTTLES DRAWN AEROBIC AND ANAEROBIC Blood Culture adequate volume   Culture NO GROWTH 5 DAYS  Final   Report Status 08/13/2017 FINAL  Final  Culture, sputum-assessment     Status: None   Collection Time: 08/08/17  1:42 PM  Result Value Ref Range Status   Specimen Description EXPECTORATED SPUTUM  Final   Special Requests NONE  Final   Sputum evaluation   Final    Sputum specimen not acceptable for testing.  Please recollect.   Gram Stain Report Called to,Read Back By and Verified With: RN Kerby Moors [phone] 1517 HDAY    Report Status 08/08/2017 FINAL  Final  MRSA PCR Screening     Status: None   Collection Time: 08/08/17  4:41 PM  Result Value Ref Range Status   MRSA by PCR NEGATIVE NEGATIVE Final    Comment:        The GeneXpert MRSA Assay (FDA approved for NASAL specimens only), is one component of a comprehensive MRSA colonization surveillance program. It is not intended to diagnose MRSA infection nor to guide or monitor treatment for MRSA infections.   Respiratory Panel by PCR     Status: None   Collection Time: 08/08/17  4:46 PM  Result Value Ref Range Status   Adenovirus NOT DETECTED NOT DETECTED Final   Coronavirus 229E NOT DETECTED NOT DETECTED Final   Coronavirus HKU1 NOT DETECTED NOT DETECTED Final   Coronavirus NL63 NOT DETECTED NOT DETECTED Final   Coronavirus OC43 NOT DETECTED NOT DETECTED Final   Metapneumovirus NOT DETECTED NOT DETECTED Final   Rhinovirus / Enterovirus NOT DETECTED NOT DETECTED Final   Influenza A NOT DETECTED NOT DETECTED Final   Influenza B NOT DETECTED NOT DETECTED Final   Parainfluenza Virus 1 NOT DETECTED NOT DETECTED Final   Parainfluenza Virus 2 NOT DETECTED NOT DETECTED Final   Parainfluenza Virus 3 NOT DETECTED NOT DETECTED Final   Parainfluenza Virus 4 NOT DETECTED NOT DETECTED Final   Respiratory Syncytial Virus NOT  DETECTED NOT DETECTED Final   Bordetella pertussis NOT DETECTED NOT DETECTED Final   Chlamydophila pneumoniae NOT DETECTED NOT DETECTED Final   Mycoplasma pneumoniae NOT DETECTED NOT DETECTED Final  Culture, expectorated sputum-assessment     Status: None   Collection Time: 08/09/17 11:41 AM  Result Value Ref Range Status   Specimen Description INDUCED SPUTUM  Final   Special Requests NONE  Final   Sputum evaluation THIS SPECIMEN IS ACCEPTABLE FOR SPUTUM CULTURE  Final   Report Status 08/09/2017 FINAL  Final  Culture, respiratory (NON-Expectorated)     Status: None   Collection Time: 08/09/17 11:41 AM  Result Value Ref Range Status   Specimen Description INDUCED SPUTUM  Final   Special Requests NONE Reflexed from [id number]  Final   Gram Stain   Final    RARE SQUAMOUS EPITHELIAL CELLS PRESENT RARE WBC PRESENT,BOTH PMN AND MONONUCLEAR NO ORGANISMS SEEN    Culture Consistent with normal respiratory flora.  Final   Report Status 08/11/2017 FINAL  Final     Scheduled Meds: . atorvastatin  40 mg Oral q1800  . diclofenac  75 mg Oral BID  . DULoxetine  60 mg Oral QAC breakfast  . enoxaparin (LOVENOX) injection  40 mg Subcutaneous Q24H  . famotidine  20 mg Oral Daily  . fluticasone  2 spray Each Nare Daily  .  furosemide  80 mg Intravenous Q8H  . gabapentin  300 mg Oral TID  . guaiFENesin  1,200 mg Oral BID  . HYDROcodone-acetaminophen  1 tablet Oral Q6H  . insulin aspart  0-5 Units Subcutaneous QHS  . insulin aspart  0-9 Units Subcutaneous TID WC  . ipratropium  0.5 mg Nebulization Q6H  . levalbuterol  1.25 mg Nebulization Q6H WA  . levothyroxine  200 mcg Oral QAC breakfast  . loratadine  10 mg Oral Daily  . LORazepam  1 mg Oral TID  . methylPREDNISolone (SOLU-MEDROL) injection  40 mg Intravenous Q12H  . mometasone-formoterol  2 puff Inhalation BID  . montelukast  10 mg Oral QHS  . pantoprazole  40 mg Oral Daily  . potassium chloride SA  20 mEq Oral Daily  . sucralfate  1 g Oral  TID WC & HS  . tamsulosin  0.4 mg Oral Daily  . tiZANidine  4 mg Oral BID     LOS: 5 days   Azuri Bozard T. Laportia Carley, MD [facility] Office  [phone] Pager - Text Page per Amion as per below:  On-Call/Text Page:      [email or web address]      password [email or web address]  If 7PM-7AM, please contact night-coverage [email or web address] Password [email or web address] 08/13/2017, 10:26 AM

## 2017-08-13 NOTE — Progress Notes (Addendum)
Nutrition Brief Note  Patient identified on the Malnutrition Screening Tool (MST) Report  Wt Readings from Last 15 Encounters:  08/13/17 259 lb 9.6 oz (117.8 kg)  07/25/17 254 lb (115.2 kg)  07/05/17 254 lb (115.2 kg)  07/04/17 253 lb (114.8 kg)  07/01/17 254 lb (115.2 kg)  06/27/17 252 lb (114.3 kg)  06/20/17 257 lb (116.6 kg)  06/14/17 253 lb (114.8 kg)  06/10/17 255 lb (115.7 kg)  06/06/17 254 lb (115.2 kg)  05/27/17 257 lb 9.6 oz (116.8 kg)  04/19/17 254 lb (115.2 kg)  04/12/17 256 lb 12.8 oz (116.5 kg)  02/28/17 256 lb (116.1 kg)  02/10/17 255 lb 3.2 oz (115.8 kg)    Body mass index is 44.56 kg/m. Patient meets criteria for obese class III based on current BMI.   Current diet order is heart healthy/carb mod, patient is consuming approximately 95-100% of meals at this time. Labs and medications reviewed.   Daughter requested Low Sodium Diet Materials to help patient at home. Provided low sodium nutrition therapy handout from the [employer].  No other interventions warranted at this time. If nutrition issues arise, please consult RD.   Dionne AnoWilliam M. Camillo Quadros, MS, RD LDN Inpatient Clinical Dietitian Pager [phone]

## 2017-08-14 LAB — BASIC METABOLIC PANEL
Anion gap: 10 (ref 5–15)
BUN: 26 mg/dL — AB (ref 6–20)
CALCIUM: 8.9 mg/dL (ref 8.9–10.3)
CO2: 38 mmol/L — ABNORMAL HIGH (ref 22–32)
CREATININE: 0.87 mg/dL (ref 0.61–1.24)
Chloride: 90 mmol/L — ABNORMAL LOW (ref 101–111)
GFR calc Af Amer: >60 mL/min (ref >60)
GLUCOSE: 199 mg/dL — AB (ref 65–99)
Potassium: 4.1 mmol/L (ref 3.5–5.1)
Sodium: 138 mmol/L (ref 135–145)

## 2017-08-14 LAB — GLUCOSE, CAPILLARY
GLUCOSE-CAPILLARY: 93 mg/dL (ref 65–99)
Glucose-Capillary: 177 mg/dL — ABNORMAL HIGH (ref 65–99)

## 2017-08-14 MED ORDER — FUROSEMIDE 40 MG PO TABS: 120.0000 mg | ORAL_TABLET | Freq: Two times a day (BID) | ORAL | 0 refills | Status: DC

## 2017-08-14 MED ORDER — PREDNISONE 20 MG PO TABS: ORAL_TABLET | ORAL | 0 refills | Status: DC

## 2017-08-14 NOTE — Progress Notes (Signed)
Pharmacy Antibiotic Note  [name] is a 66 y.o. male admitted on 08/08/2017 with SOB/ pneumonia.  CXR shows atelectasis. Afebrile, WBC WNL. Pharmacy has been consulted for Levaquin dosing.  Plan: Last dose levaquin 750mg  IV Q24h today  Height: 5\' 4"  (162.6 cm) Weight: 257 lb 3.2 oz (116.7 kg) IBW/kg (Calculated) : 59.2  Temp (24hrs), Avg:97.9 F (36.6 C), Min:97.6 F (36.4 C), Max:98.4 F (36.9 C)   Recent Labs Lab 08/08/17 1558 08/08/17 1612  08/09/17 0835 08/09/17 1127 08/10/17 0554 08/10/17 0820 08/11/17 0300 08/12/17 0339 08/13/17 0558 08/14/17 0552  WBC  --   --   < > 12.1*  --   --  11.9* 10.6* 8.7 9.3  --   CREATININE  --   --   < >  --   --  0.79  --  0.67 0.69 0.88 0.87  LATICACIDVEN 2.4* 2.40*  --  1.5 3.3*  --  2.2*  --   --   --   --   < > = values in this interval not displayed.  Estimated Creatinine Clearance: 99.7 mL/min (by C-G formula based on SCr of 0.87 mg/dL).    Allergies  Allergen Reactions  . Penicillins Swelling and Rash    Has patient had a PCN reaction causing immediate rash, facial/tongue/throat swelling, SOB or lightheadedness with hypotension: Yes Has patient had a PCN reaction causing severe rash involving mucus membranes or skin necrosis: Yes Has patient had a PCN reaction that required hospitalization: No Has patient had a PCN reaction occurring within the last 10 years: No If all of the above answers are "NO", then may proceed with Cephalosporin use.     Antimicrobials this admission: Levaquin 8/27 >> 9/2 Vanc 8/27 >>8/30  Microbiology results: 8/27 Blood: ngtd 8/27 MRSA PCR neg 8/28 sputum: negative (normal flora) 8/27 Resp pane PCR none detected  Thank you for allowing pharmacy to be a part of this patient's care.   Diana L. Marcy Salvoaymond, PharmD, MS PGY1 Pharmacy Resident Pager: [phone]

## 2017-08-14 NOTE — Evaluation (Signed)
Physical Therapy Evaluation Patient Details Name: [name] MRN: [id number] DOB: October 24, 1951 Today's Date: 08/14/2017   History of Present Illness  66 y.o. M w/ a  Hx of Anxiety, Depression, BPH, Chronic Respiratory failure, COPD, Chronic Diastolic CHF, HTN, HLD, and Hypothyroid who presented w/ the acute onset of SOB. Patient attempted use his nebulizer without improvement. O2 saturations were at 81% on his baseline 4 L.  Clinical Impression   Pt admitted with above diagnosis. Pt currently with functional limitations due to the deficits listed below (see PT Problem List). Presents with decr functional capacity; needed to incr O2 from 4L to 5L during amb to keep O2 sats at safe levels;  Pt will benefit from skilled PT to increase their independence and safety with mobility to allow discharge to the venue listed below.       Follow Up Recommendations Home health PT;Other (comment) [facility]) for chronic disease management    Equipment Recommendations  None recommended by PT    Recommendations for Other Services       Precautions / Restrictions Precautions Precautions: Fall Precaution Comments: Watch O2 sats Restrictions Weight Bearing Restrictions: No      Mobility  Bed Mobility               General bed mobility comments: In chair upon arrival; typically sleeps in recliner  Transfers Overall transfer level: Needs assistance Equipment used: Rolling walker (2 wheeled) Transfers: Sit to/from Stand Sit to Stand: Min assist         General transfer comment: Min assist to steady; cues for hand placement and safety  Ambulation/Gait Ambulation/Gait assistance: Min guard;+2 safety/equipment (chair push) Ambulation Distance (Feet): 100 Feet Assistive device: Rolling walker (2 wheeled) Gait Pattern/deviations: Step-through pattern;Trunk flexed;Decreased step length - right;Decreased step length - left;Decreased stride length Gait velocity: slowed Gait velocity  interpretation: Below normal speed for age/gender General Gait Details: Cues to self-monitor for activity tolerance; desatted towards end of walk  Stairs            Wheelchair Mobility    Modified Rankin (Stroke Patients Only)       Balance Overall balance assessment: No apparent balance deficits (not formally assessed)                                           Pertinent Vitals/Pain Pain Assessment: No/denies pain    Home Living Family/patient expects to be discharged to:: Private residence Living Arrangements: Spouse/significant other Available Help at Discharge: Family;Available 24 hours/day Type of Home: House Home Access: Stairs to enter Entrance Stairs-Rails: None Entrance Stairs-Number of Steps: 1 Home Layout: Two level;Able to live on main level with bedroom/bathroom Home Equipment: Walker - 4 wheels;Shower seat - built in;Cane - single point;Adaptive equipment Additional Comments: Uses 4L O2 at home.     Prior Function Level of Independence: Needs assistance   Gait / Transfers Assistance Needed: Uses rollator   ADL's / Homemaking Assistance Needed: Supervision for daily tasks. Requires many rest breaks and sits to complete majority of ADL.   Comments: Sleeps in recliner.     Hand Dominance   Dominant Hand: Right    Extremity/Trunk Assessment   Upper Extremity Assessment Upper Extremity Assessment: Defer to OT evaluation RUE Deficits / Details: Reports torn rotator cuff. AROM shoulder limited to ~0-45 degrees.  LUE Deficits / Details: Reports needs to have reverse TSA. AROM shoulder limited  to ~0-45 degrees forward flexion.    Lower Extremity Assessment Lower Extremity Assessment: Generalized weakness       Communication   Communication: No difficulties  Cognition Arousal/Alertness: Awake/alert Behavior During Therapy: WFL for tasks assessed/performed Overall Cognitive Status: Within Functional Limits for tasks assessed                                         General Comments      Exercises     Assessment/Plan    PT Assessment Patient needs continued PT services  PT Problem List Decreased strength;Decreased activity tolerance;Decreased mobility;Decreased knowledge of use of DME;Decreased knowledge of precautions;Cardiopulmonary status limiting activity       PT Treatment Interventions DME instruction;Gait training;Stair training;Functional mobility training;Therapeutic activities;Therapeutic exercise;Patient/family education    PT Goals (Current goals can be found in the Care Plan section)  Acute Rehab PT Goals Patient Stated Goal: to go home today PT Goal Formulation: With patient Time For Goal Achievement: 08/21/17 Potential to Achieve Goals: Good    Frequency Min 3X/week   Barriers to discharge        Co-evaluation               AM-PAC PT "6 Clicks" Daily Activity  Outcome Measure Difficulty turning over in bed (including adjusting bedclothes, sheets and blankets)?: A Little Difficulty moving from lying on back to sitting on the side of the bed? : A Little Difficulty sitting down on and standing up from a chair with arms (e.g., wheelchair, bedside commode, etc,.)?: A Little Help needed moving to and from a bed to chair (including a wheelchair)?: A Little Help needed walking in hospital room?: A Little Help needed climbing 3-5 steps with a railing? : A Little 6 Click Score: 18    End of Session Equipment Utilized During Treatment: Oxygen;Gait belt Activity Tolerance: Patient tolerated treatment well Patient left: in chair;with call bell/phone within reach Nurse Communication: Mobility status PT Visit Diagnosis: Other abnormalities of gait and mobility (R26.89)    Time: [id number] PT Time Calculation (min) (ACUTE ONLY): 31 min   Charges:   PT Evaluation $PT Eval Moderate Complexity: 1 Mod PT Treatments $Gait Training: 8-22 mins   PT G Codes:         Van ClinesHolly Pegi Milazzo, PT  Acute Rehabilitation Services Pager [phone] Office [phone]   Levi AlandHolly H Laryn Venning 08/14/2017, 1:08 PM

## 2017-08-14 NOTE — Discharge Instructions (Signed)
Chronic Obstructive Pulmonary Disease Chronic obstructive pulmonary disease (COPD) is a common lung condition in which airflow from the lungs is limited. COPD is a general term that can be used to describe many different lung problems that limit airflow, including both chronic bronchitis and emphysema. If you have COPD, your lung function will probably never return to normal, but there are measures you can take to improve lung function and make yourself feel better. What are the causes?  Smoking (common).  Exposure to secondhand smoke.  Genetic problems.  Chronic inflammatory lung diseases or recurrent infections. What are the signs or symptoms?  Shortness of breath, especially with physical activity.  Deep, persistent (chronic) cough with a large amount of thick mucus.  Wheezing.  Rapid breaths (tachypnea).  Gray or bluish discoloration (cyanosis) of the skin, especially in your fingers, toes, or lips.  Fatigue.  Weight loss.  Frequent infections or episodes when breathing symptoms become much worse (exacerbations).  Chest tightness. How is this diagnosed? Your health care provider will take a medical history and perform a physical examination to diagnose COPD. Additional tests for COPD may include:  Lung (pulmonary) function tests.  Chest X-ray.  CT scan.  Blood tests. How is this treated? Treatment for COPD may include:  Inhaler and nebulizer medicines. These help manage the symptoms of COPD and make your breathing more comfortable.  Supplemental oxygen. Supplemental oxygen is only helpful if you have a low oxygen level in your blood.  Exercise and physical activity. These are beneficial for nearly all people with COPD.  Lung surgery or transplant.  Nutrition therapy to gain weight, if you are underweight.  Pulmonary rehabilitation. This may involve working with a team of health care providers and specialists, such as respiratory, occupational, and physical  therapists. Follow these instructions at home:  Take all medicines (inhaled or pills) as directed by your health care provider.  Avoid over-the-counter medicines or cough syrups that dry up your airway (such as antihistamines) and slow down the elimination of secretions unless instructed otherwise by your health care provider.  If you are a smoker, the most important thing that you can do is stop smoking. Continuing to smoke will cause further lung damage and breathing trouble. Ask your health care provider for help with quitting smoking. He or she can direct you to community resources or hospitals that provide support.  Avoid exposure to irritants such as smoke, chemicals, and fumes that aggravate your breathing.  Use oxygen therapy and pulmonary rehabilitation if directed by your health care provider. If you require home oxygen therapy, ask your health care provider whether you should purchase a pulse oximeter to measure your oxygen level at home.  Avoid contact with individuals who have a contagious illness.  Avoid extreme temperature and humidity changes.  Eat healthy foods. Eating smaller, more frequent meals and resting before meals may help you maintain your strength.  Stay active, but balance activity with periods of rest. Exercise and physical activity will help you maintain your ability to do things you want to do.  Preventing infection and hospitalization is very important when you have COPD. Make sure to receive all the vaccines your health care provider recommends, especially the pneumococcal and influenza vaccines. Ask your health care provider whether you need a pneumonia vaccine.  Learn and use relaxation techniques to manage stress.  Learn and use controlled breathing techniques as directed by your health care provider. Controlled breathing techniques include: 1. Pursed lip breathing. Start by breathing in (inhaling)   through your nose for 1 second. Then, purse your lips as  if you were going to whistle and breathe out (exhale) through the pursed lips for 2 seconds. 2. Diaphragmatic breathing. Start by putting one hand on your abdomen just above your waist. Inhale slowly through your nose. The hand on your abdomen should move out. Then purse your lips and exhale slowly. You should be able to feel the hand on your abdomen moving in as you exhale.  Learn and use controlled coughing to clear mucus from your lungs. Controlled coughing is a series of short, progressive coughs. The steps of controlled coughing are: 1. Lean your head slightly forward. 2. Breathe in deeply using diaphragmatic breathing. 3. Try to hold your breath for 3 seconds. 4. Keep your mouth slightly open while coughing twice. 5. Spit any mucus out into a tissue. 6. Rest and repeat the steps once or twice as needed. Contact a health care provider if:  You are coughing up more mucus than usual.  There is a change in the color or thickness of your mucus.  Your breathing is more labored than usual.  Your breathing is faster than usual. Get help right away if:  You have shortness of breath while you are resting.  You have shortness of breath that prevents you from:  Being able to talk.  Performing your usual physical activities.  You have chest pain lasting longer than 5 minutes.  Your skin color is more cyanotic than usual.  You measure low oxygen saturations for longer than 5 minutes with a pulse oximeter. This information is not intended to replace advice given to you by your health care provider. Make sure you discuss any questions you have with your health care provider. Document Released: 09/08/2005 Document Revised: 05/06/2016 Document Reviewed: 07/26/2013 Elsevier Interactive Patient Education  2017 [employer] Inc[employer]  

## 2017-08-14 NOTE — Discharge Summary (Signed)
DISCHARGE SUMMARY  [name]  MR#: [id number]  DOB:[birth year]  Date of Admission: 08/08/2017 Date of Discharge: 08/14/2017  Attending Physician:MCCLUNG,JEFFREY T  Patient's WUJ:[id number], Priscille Heidelberg, MD  Consults:  none  Disposition: D/C home   Follow-up Appts: Follow-up Information    Donita Brooks, MD Follow up in 7 day(s).   Specialty:  Family Medicine Contact information: [address] [place] [address] [phone]        Nyoka Cowden, MD Follow up in 5 day(s).   Specialty:  Pulmonary Disease Contact information: [address] [place] [address] [phone]           Discharge Diagnoses: SIRS  Acute on chronic respiratory failurewith hypoxia / COPD exacerbation  Chronic Diastolic CHF Essential HTN PreDiabetes  HLD GERD Depression Chronic painsyndrome: BPH Anxiety  Initial presentation: 66 y.o.M w/ a  Hx ofAnxiety, Depression, BPH, Chronic Respiratory failure, COPD, Chronic Diastolic CHF, HTN, HLD, and Hypothyroid who presented w/ the acute onset of SOB. Patient attempted to use his nebulizer without improvement. O2 saturations were at 81% on his baseline 4 L.EMS was called and patient was brought to the ED where he was placed on BiPAP. Attempted to wean off BiPAP after approximately 2-1/2 hours without success.  Hospital Course:  SIRS  Tm 102.8 this admit but no fever since that one reading - I do not suspect he has had a true systemic infection, but rather SIRS related to an exacerbation of severe pulm disease - now off abx and stable   Acute on chronic respiratory failurewith hypoxia / COPD exacerbation  On home O2 at 4 L - has stabilized w/ pt back on his home O2 of 4L at time of d/c   Chronic Diastolic CHF TTE January 2017, EF 55% grade 1 diastolic dysfunction - TTE this admit w/o new information - baseline weights over the last year as low as 112kg - cont to diurese as outpt - net negative ~8L this  hospitalization - educated pt on need to monitor fluid balance at home   Essential HTN BP improved w/ diuresis  PreDiabetes  8/27 A1c6.2 - CBG improving w/ weaning of steroids - no need for med tx at this time   HLD Cont Lipitor at increased dose   GERD  Depression  Crhonic painsyndrome: continue home medication  BPH Continue Flomax  Anxiety Cont ativan prn as per home regimen   Allergies as of 08/14/2017      Reactions   Penicillins Swelling, Rash   Has patient had a PCN reaction causing immediate rash, facial/tongue/throat swelling, SOB or lightheadedness with hypotension: Yes Has patient had a PCN reaction causing severe rash involving mucus membranes or skin necrosis: Yes Has patient had a PCN reaction that required hospitalization: No Has patient had a PCN reaction occurring within the last 10 years: No If all of the above answers are "NO", then may proceed with Cephalosporin use.      Medication List    STOP taking these medications   clotrimazole-betamethasone cream Commonly known as:  LOTRISONE     TAKE these medications   ACAPELLA Misc Dx 496   albuterol (2.5 MG/3ML) 0.083% nebulizer solution Commonly known as:  PROVENTIL Take 3 mLs (2.5 mg total) by nebulization every 6 (six) hours as needed for wheezing or shortness of breath. What changed:  Another medication with the same name was removed. Continue taking this medication, and follow the directions you see here.   albuterol 108 (90  Base) MCG/ACT inhaler Commonly known as:  PROAIR HFA Inhale 1-2 puffs into the lungs every 4 (four) hours as needed for wheezing or shortness of breath. What changed:  Another medication with the same name was removed. Continue taking this medication, and follow the directions you see here.   atorvastatin 10 MG tablet Commonly known as:  LIPITOR TAKE 0.5 TABLETS (5 MG TOTAL) BY MOUTH DAILY.   budesonide-formoterol 160-4.5 MCG/ACT inhaler Commonly known as:   SYMBICORT INHALE 2 PUFFS INTO LUNGS TWICE DAILY   cetirizine 10 MG tablet Commonly known as:  ZYRTEC Take 10 mg by mouth daily as needed (drainage).   clotrimazole 1 % cream Commonly known as:  LOTRIMIN Apply 1 application topically 2 (two) times daily as needed (rash).   diclofenac 75 MG EC tablet Commonly known as:  VOLTAREN Take 1 tablet (75 mg total) by mouth 2 (two) times daily.   DULoxetine 60 MG capsule Commonly known as:  CYMBALTA Take 1 capsule by mouth daily before breakfast.   fluticasone 50 MCG/ACT nasal spray Commonly known as:  FLONASE Place 2 sprays into both nostrils daily.   furosemide 40 MG tablet Commonly known as:  LASIX Take 3 tablets (120 mg total) by mouth 2 (two) times daily. 3 tabs in the morning and 2 in the afternoon What changed:  how much to take  how to take this  when to take this   gabapentin 300 MG capsule Commonly known as:  NEURONTIN TAKE 1 CAPSULE (300 MG TOTAL) BY MOUTH 3 (THREE) TIMES DAILY.   HYDROcodone-acetaminophen 10-325 MG tablet Commonly known as:  NORCO Take 1 tablet by mouth every 4 (four) hours as needed (cough not responding to mucinex dm). What changed:  when to take this  reasons to take this   KLOR-CON M20 20 MEQ tablet Generic drug:  potassium chloride SA TAKE ONE TABLET BY MOUTH DAILY   levothyroxine 200 MCG tablet Commonly known as:  SYNTHROID, LEVOTHROID TAKE 1 TABLET BY MOUTH EVERY DAY   LORazepam 0.5 MG tablet Commonly known as:  ATIVAN Take 1 tablet (0.5 mg total) by mouth every 8 (eight) hours as needed. What changed:  reasons to take this   montelukast 10 MG tablet Commonly known as:  SINGULAIR TAKE 1 TABLET (10 MG TOTAL) BY MOUTH AT BEDTIME.   OXYGEN Inhale 4 L into the lungs continuous.   pantoprazole 40 MG tablet Commonly known as:  PROTONIX Take 1 tablet (40 mg total) by mouth daily.   predniSONE 20 MG tablet Commonly known as:  DELTASONE Take 2 tablets (40mg ) once a day for 5  days, then reduce to 1 tablet (20mg ) a day thereafter until otherwise instructed by Dr. Sherene Sires What changed:  medication strength  See the new instructions.   ranitidine 150 MG tablet Commonly known as:  ZANTAC Take 150 mg by mouth at bedtime.   sucralfate 1 g tablet Commonly known as:  CARAFATE TAKE 1 TABLET FOUR TIMES A DAY   tamsulosin 0.4 MG Caps capsule Commonly known as:  FLOMAX TAKE 1 CAPSULE (0.4 MG TOTAL) BY MOUTH DAILY.   Tiotropium Bromide Monohydrate 2.5 MCG/ACT Aers Commonly known as:  SPIRIVA RESPIMAT 2 puffs each am   tiZANidine 4 MG capsule Commonly known as:  ZANAFLEX Take 4 mg by mouth 2 (two) times daily.            Durable Medical Equipment        Start     Ordered   08/14/17 1221  DME tub bench  Once     08/14/17 1220   08/14/17 1213  For home use only DME Shower stool  Once     08/14/17 1213       Discharge Care Instructions        Start     Ordered   08/14/17 0000  furosemide (LASIX) 40 MG tablet  2 times daily     08/14/17 1220   08/14/17 0000  predniSONE (DELTASONE) 20 MG tablet     08/14/17 1220   08/14/17 0000  Increase activity slowly     08/14/17 1220      Day of Discharge BP (!) 143/76 (BP Location: Right Arm)   Pulse 80   Temp 97.7 F (36.5 C) (Oral)   Resp 20   Ht 5\' 4"  (1.626 m)   Wt 116.7 kg (257 lb 3.2 oz)   SpO2 95%   BMI 44.15 kg/m   Physical Exam: General: No acute respiratory distress Lungs: improved air movement th/o - mild exp wheeze  Cardiovascular: Regular rate and rhythm without murmur  Abdomen: Nontender, morbidly obese, stable ventral hernia, soft, bowel sounds positive, no rebound, no ascites, no appreciable mass Extremities: 1+ edema B LE - B LE compression sleeves in place   Basic Metabolic Panel:  Recent Labs Lab 08/08/17 0958  08/10/17 0554 08/11/17 0300 08/12/17 0339 08/13/17 0558 08/14/17 0552  NA  --   < > 138 138 139 137 138  K  --   < > 3.7 4.2 3.9 4.2 4.1  CL  --   < > 94*  97* 93* 90* 90*  CO2  --   < > 33* 33* 37* 38* 38*  GLUCOSE  --   < > 207* 205* 213* 219* 199*  BUN  --   < > 22* 19 21* 28* 26*  CREATININE  --   < > 0.79 0.67 0.69 0.88 0.87  CALCIUM  --   < > 9.5 9.3 9.1 9.2 8.9  MG 1.6*  --  2.2 2.2 2.3 2.5*  --   PHOS 2.9  --   --   --   --   --   --   < > = values in this interval not displayed.  Liver Function Tests:  Recent Labs Lab 08/08/17 0642  AST 20  ALT 29  ALKPHOS 77  BILITOT 0.7  PROT 6.2*  ALBUMIN 3.4*    Coags:  Recent Labs Lab 08/08/17 0958  INR 1.04    CBC:  Recent Labs Lab 08/08/17 0642  08/09/17 0835 08/10/17 0820 08/11/17 0300 08/12/17 0339 08/13/17 0558  WBC 10.2  < > 12.1* 11.9* 10.6* 8.7 9.3  NEUTROABS 8.3*  --   --  10.9*  --   --   --   HGB 12.2*  < > 11.7* 11.8* 11.8* 12.4* 13.3  HCT 40.0  < > 38.5* 38.7* 39.3 40.6 42.5  MCV 96.2  < > 94.6 94.6 94.9 95.1 92.4  PLT 158  < > 183 183 191 199 196  < > = values in this interval not displayed.  Cardiac Enzymes:  Recent Labs Lab 08/08/17 0958 08/08/17 1558 08/08/17 2117  TROPONINI <0.03 <0.03 0.12*   BNP (last 3 results)  Recent Labs  08/08/17 0642  BNP 38.3    ProBNP (last 3 results)  Recent Labs  05/27/17 1118  PROBNP 28.0     CBG:  Recent Labs Lab 08/13/17 0736 08/13/17 1118 08/13/17 1626 08/13/17 2114 08/14/17  0810  GLUCAP 197* 219* 141* 293* 177*    Recent Results (from the past 240 hour(s))  Blood Culture (routine x 2)     Status: None   Collection Time: 08/08/17  7:13 AM  Result Value Ref Range Status   Specimen Description BLOOD RIGHT ANTECUBITAL  Final   Special Requests   Final    BOTTLES DRAWN AEROBIC AND ANAEROBIC Blood Culture adequate volume   Culture NO GROWTH 5 DAYS  Final   Report Status 08/13/2017 FINAL  Final  Blood Culture (routine x 2)     Status: None   Collection Time: 08/08/17  7:18 AM  Result Value Ref Range Status   Specimen Description BLOOD RIGHT HAND  Final   Special Requests   Final      BOTTLES DRAWN AEROBIC AND ANAEROBIC Blood Culture adequate volume   Culture NO GROWTH 5 DAYS  Final   Report Status 08/13/2017 FINAL  Final  Culture, sputum-assessment     Status: None   Collection Time: 08/08/17  1:42 PM  Result Value Ref Range Status   Specimen Description EXPECTORATED SPUTUM  Final   Special Requests NONE  Final   Sputum evaluation   Final    Sputum specimen not acceptable for testing.  Please recollect.   Gram Stain Report Called to,Read Back By and Verified With: RN Kerby Moors [phone] 1517 HDAY    Report Status 08/08/2017 FINAL  Final  MRSA PCR Screening     Status: None   Collection Time: 08/08/17  4:41 PM  Result Value Ref Range Status   MRSA by PCR NEGATIVE NEGATIVE Final    Comment:        The GeneXpert MRSA Assay (FDA approved for NASAL specimens only), is one component of a comprehensive MRSA colonization surveillance program. It is not intended to diagnose MRSA infection nor to guide or monitor treatment for MRSA infections.   Respiratory Panel by PCR     Status: None   Collection Time: 08/08/17  4:46 PM  Result Value Ref Range Status   Adenovirus NOT DETECTED NOT DETECTED Final   Coronavirus 229E NOT DETECTED NOT DETECTED Final   Coronavirus HKU1 NOT DETECTED NOT DETECTED Final   Coronavirus NL63 NOT DETECTED NOT DETECTED Final   Coronavirus OC43 NOT DETECTED NOT DETECTED Final   Metapneumovirus NOT DETECTED NOT DETECTED Final   Rhinovirus / Enterovirus NOT DETECTED NOT DETECTED Final   Influenza A NOT DETECTED NOT DETECTED Final   Influenza B NOT DETECTED NOT DETECTED Final   Parainfluenza Virus 1 NOT DETECTED NOT DETECTED Final   Parainfluenza Virus 2 NOT DETECTED NOT DETECTED Final   Parainfluenza Virus 3 NOT DETECTED NOT DETECTED Final   Parainfluenza Virus 4 NOT DETECTED NOT DETECTED Final   Respiratory Syncytial Virus NOT DETECTED NOT DETECTED Final   Bordetella pertussis NOT DETECTED NOT DETECTED Final   Chlamydophila pneumoniae NOT  DETECTED NOT DETECTED Final   Mycoplasma pneumoniae NOT DETECTED NOT DETECTED Final  Culture, expectorated sputum-assessment     Status: None   Collection Time: 08/09/17 11:41 AM  Result Value Ref Range Status   Specimen Description INDUCED SPUTUM  Final   Special Requests NONE  Final   Sputum evaluation THIS SPECIMEN IS ACCEPTABLE FOR SPUTUM CULTURE  Final   Report Status 08/09/2017 FINAL  Final  Culture, respiratory (NON-Expectorated)     Status: None   Collection Time: 08/09/17 11:41 AM  Result Value Ref Range Status   Specimen Description INDUCED SPUTUM  Final  Special Requests NONE Reflexed from [id number]  Final   Gram Stain   Final    RARE SQUAMOUS EPITHELIAL CELLS PRESENT RARE WBC PRESENT,BOTH PMN AND MONONUCLEAR NO ORGANISMS SEEN    Culture Consistent with normal respiratory flora.  Final   Report Status 08/11/2017 FINAL  Final     Time spent in discharge (includes decision making & examination of pt): 35 minutes  08/14/2017, 12:22 PM   Lonia Blood, MD [facility] Office  [phone] Pager [phone]  On-Call/Text Page:      [email or web address]      password [facility]

## 2017-08-14 NOTE — Progress Notes (Signed)
NURSING PROGRESS NOTE  Samuel DakinMichael [name] [id number] Discharge Data: 08/14/2017 2:01 PM Attending Provider: Lonia BloodMcClung, Jeffrey T, MD NUU:[id number]:Pickard, Priscille HeidelbergWarren T, MD   Samuel DakinMichael Farooqui to be D/C'd Home per MD order.    All IV's will be discontinued and monitored for bleeding.  All belongings will be returned to patient for patient to take home.  Last Documented Vital Signs:  Blood pressure (!) 143/76, pulse 80, temperature 97.7 F (36.5 C), temperature source Oral, resp. rate 20, height 5\' 4"  (1.626 m), weight 116.7 kg (257 lb 3.2 oz), SpO2 96 %.  Madelin RearLonnie Lydie Stammen, MSN, RN, [employer]RN

## 2017-08-14 NOTE — Evaluation (Signed)
Occupational Therapy Evaluation Patient Details Name: [name] MRN: [id number] DOB: 02/11/1951 Today's Date: 08/14/2017    History of Present Illness 66 y.o. M w/ a  Hx of Anxiety, Depression, BPH, Chronic Respiratory failure, COPD, Chronic Diastolic CHF, HTN, HLD, and Hypothyroid who presented w/ the acute onset of SOB. Patient attempted use his nebulizer without improvement. O2 saturations were at 81% on his baseline 4 L.   Clinical Impression   PTA, pt was utilizing Rollator for functional mobility and required increased time and rest breaks during basic ADL tasks. Pt currently requires mod assist for LB ADL, supervision for seated UB ADL, and min guard assist for toilet transfers. At rest, pt with SpO2 92-94% on 4L O2 via [place]. Pt with significantly diminished activity tolerance for ADL with SpO2 desaturation to 68% during standing ADL participation and able to rebound to mid-90s when increased to 5L O2 via [place] with pursed lip breathing techniques in seated position. Initiated education with pt and family concerning energy conservation strategies, use of AE for LB ADL, and DME for showering and they verbalize understanding. He would benefit from continued OT services to maximize independence and tolerance for ADL participation. Recommend home health OT services post-acute D/C and 24 hour assistance from family.    Follow Up Recommendations  Home health OT;Supervision/Assistance - 24 hour    Equipment Recommendations  3 in 1 bedside commode;Tub/shower seat (family interested in purcahsing shower seat)    Recommendations for Other Services       Precautions / Restrictions Precautions Precautions: Fall Precaution Comments: Watch O2 sats Restrictions Weight Bearing Restrictions: No      Mobility Bed Mobility                  Transfers                 General transfer comment: OOB in chair on OT arrival.     Balance Overall balance assessment: No apparent balance  deficits (not formally assessed)                                         ADL either performed or assessed with clinical judgement   ADL Overall ADL's : Needs assistance/impaired Eating/Feeding: Supervision/ safety;Sitting   Grooming: Supervision/safety;Sitting   Upper Body Bathing: Supervision/ safety;Sitting   Lower Body Bathing: Moderate assistance;Sit to/from stand Lower Body Bathing Details (indicate cue type and reason): Educated on potential use of reacher. Upper Body Dressing : Supervision/safety;Sitting   Lower Body Dressing: Moderate assistance;Sit to/from stand   Toilet Transfer: Min guard;RW   Toileting- ArchitectClothing Manipulation and Hygiene: Min guard;Sit to/from stand       Functional mobility during ADLs: Min guard;Rolling walker General ADL Comments: Pt with O2 desaturation to 68% with standing ADL tasks on 4L O2. Returned to seated position, encouraged pursed lip breathing, and increased O2 to 5L/min and able to rebound to mid 90s.     Vision Patient Visual Report: No change from baseline Vision Assessment?: No apparent visual deficits     Perception     Praxis      Pertinent Vitals/Pain Pain Assessment: No/denies pain     Hand Dominance     Extremity/Trunk Assessment Upper Extremity Assessment Upper Extremity Assessment: RUE deficits/detail;LUE deficits/detail RUE Deficits / Details: Reports torn rotator cuff. AROM shoulder limited to ~0-45 degrees.  LUE Deficits / Details: Reports needs to have reverse  TSA. AROM shoulder limited to ~0-45 degrees forward flexion.   Lower Extremity Assessment Lower Extremity Assessment: Defer to PT evaluation       Communication Communication Communication: No difficulties   Cognition Arousal/Alertness: Awake/alert Behavior During Therapy: WFL for tasks assessed/performed Overall Cognitive Status: Within Functional Limits for tasks assessed                                      General Comments       Exercises     Shoulder Instructions      Home Living Family/patient expects to be discharged to:: Private residence Living Arrangements: Spouse/significant other Available Help at Discharge: Family;Available 24 hours/day Type of Home: House Home Access: Stairs to enter           [employer] Shower/Tub: Producer, television/film/video: Standard     Home Equipment: Environmental consultant - 4 wheels;Shower seat - built in;Cane - single point;Adaptive equipment Adaptive Equipment: Reacher Additional Comments: Uses 4L O2 at home.       Prior Functioning/Environment Level of Independence: Needs assistance  Gait / Transfers Assistance Needed: Uses rollator  ADL's / Homemaking Assistance Needed: Supervision for daily tasks. Requires many rest breaks and sits to complete majority of ADL.    Comments: Sleeps in recliner.        OT Problem List: Decreased activity tolerance;Decreased safety awareness;Decreased knowledge of use of DME or AE;Decreased knowledge of precautions;Cardiopulmonary status limiting activity      OT Treatment/Interventions: Self-care/ADL training;Therapeutic exercise;Therapeutic activities;Patient/family education;Energy conservation    OT Goals(Current goals can be found in the care plan section) Acute Rehab OT Goals Patient Stated Goal: to go home today OT Goal Formulation: With patient/family Time For Goal Achievement: 08/21/17 Potential to Achieve Goals: Good  OT Frequency: Min 2X/week   Barriers to D/C:            Co-evaluation              AM-PAC PT "6 Clicks" Daily Activity     Outcome Measure Help from another person eating meals?: A Little Help from another person taking care of personal grooming?: A Little Help from another person toileting, which includes using toliet, bedpan, or urinal?: A Little Help from another person bathing (including washing, rinsing, drying)?: A Lot Help from another person to put on and taking  off regular upper body clothing?: A Little Help from another person to put on and taking off regular lower body clothing?: A Lot 6 Click Score: 16   End of Session Equipment Utilized During Treatment: Rolling walker  Activity Tolerance: Patient tolerated treatment well Patient left: in chair;with family/visitor present  OT Visit Diagnosis: Muscle weakness (generalized) (M62.81)                Time: 1610-9604 OT Time Calculation (min): 26 min Charges:  OT General Charges $OT Visit: 1 Visit OT Evaluation $OT Eval Moderate Complexity: 1 Mod OT Treatments $Self Care/Home Management : 8-22 mins G-Codes:     Doristine Section, MS OTR/L  Pager: [phone]   Ever Gustafson A Birch Farino 08/14/2017, 11:58 AM

## 2017-08-18 ENCOUNTER — Encounter: Payer: Self-pay | Admitting: Adult Health

## 2017-08-18 ENCOUNTER — Ambulatory Visit (INDEPENDENT_AMBULATORY_CARE_PROVIDER_SITE_OTHER): Payer: Medicare HMO | Admitting: Adult Health

## 2017-08-18 DIAGNOSIS — B370 Candidal stomatitis: Secondary | ICD-10-CM

## 2017-08-18 DIAGNOSIS — J9611 Chronic respiratory failure with hypoxia: Secondary | ICD-10-CM

## 2017-08-18 DIAGNOSIS — J441 Chronic obstructive pulmonary disease with (acute) exacerbation: Secondary | ICD-10-CM | POA: Diagnosis not present

## 2017-08-18 MED ORDER — CLOTRIMAZOLE 10 MG MT TROC: 10.0000 mg | Freq: Every day | OROMUCOSAL | 0 refills | Status: DC

## 2017-08-18 NOTE — Progress Notes (Signed)
Chart and office note reviewed in detail  > agree with a/p as outlined    

## 2017-08-18 NOTE — Assessment & Plan Note (Signed)
Cont on o2 .  

## 2017-08-18 NOTE — Addendum Note (Signed)
Addended by: Boone MasterJONES, Conya Ellinwood E on: 08/18/2017 05:29 PM   Modules accepted: Orders

## 2017-08-18 NOTE — Assessment & Plan Note (Signed)
mycelex x 1 week .  Inhaler oral care.  

## 2017-08-18 NOTE — Patient Instructions (Addendum)
Continue on Symbicort and Spiriva . Rinse after use.  Taper prednisone as directed to 20mg  alternating with 10mg  daily .  Mycelix 5 times a day for 1 week .  Continue on Oxygen 4l/m .  Follow up with Dr. Sherene SiresWert  In 4-6 weeks and As needed   Please contact office for sooner follow up if symptoms do not improve or worsen or seek emergency care
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
## 2017-08-18 NOTE — Assessment & Plan Note (Signed)
Exacerbation now resolving   Plan  Patient Instructions  Continue on Symbicort and Spiriva . Rinse after use.  Taper prednisone as directed to 20mg  alternating with 10mg  daily .  Mycelix 5 times a day for 1 week .  Continue on Oxygen 4l/m .  Follow up with Dr. Sherene SiresWert  In 4-6 weeks and As needed   Please contact office for sooner follow up if symptoms do not improve or worsen or seek emergency care

## 2017-08-19 ENCOUNTER — Ambulatory Visit (INDEPENDENT_AMBULATORY_CARE_PROVIDER_SITE_OTHER): Payer: Medicare HMO | Admitting: Family Medicine

## 2017-08-19 ENCOUNTER — Encounter: Payer: Self-pay | Admitting: Family Medicine

## 2017-08-19 VITALS — BP 128/78 | HR 92 | Temp 97.5°F | Resp 18 | Ht 63.0 in | Wt 247.0 lb

## 2017-08-19 DIAGNOSIS — Z09 Encounter for follow-up examination after completed treatment for conditions other than malignant neoplasm: Secondary | ICD-10-CM | POA: Diagnosis not present

## 2017-08-19 LAB — CBC WITH DIFFERENTIAL/PLATELET
BASOS ABS: 81 {cells}/uL (ref 0–200)
Basophils Relative: 0.5 %
Eosinophils Absolute: 49 cells/uL (ref 15–500)
Eosinophils Relative: 0.3 %
HEMATOCRIT: 42.4 % (ref 38.5–50.0)
Hemoglobin: 14.1 g/dL (ref 13.2–17.1)
Lymphs Abs: 778 cells/uL — ABNORMAL LOW (ref 850–3900)
MCH: 29.8 pg (ref 27.0–33.0)
MCHC: 33.3 g/dL (ref 32.0–36.0)
MCV: 89.6 fL (ref 80.0–100.0)
MPV: 11.5 fL (ref 7.5–12.5)
Monocytes Relative: 2.9 %
NEUTROS PCT: 91.5 %
Neutro Abs: 14823 cells/uL — ABNORMAL HIGH (ref 1500–7800)
Platelets: 200 10*3/uL (ref 140–400)
RBC: 4.73 10*6/uL (ref 4.20–5.80)
RDW: 13.9 % (ref 11.0–15.0)
TOTAL LYMPHOCYTE: 4.8 %
WBC mixed population: 470 cells/uL (ref 200–950)
WBC: 16.2 10*3/uL — AB (ref 3.8–10.8)

## 2017-08-19 LAB — COMPLETE METABOLIC PANEL WITH GFR
AG Ratio: 1.6 (calc) (ref 1.0–2.5)
ALBUMIN MSPROF: 3.6 g/dL (ref 3.6–5.1)
ALKALINE PHOSPHATASE (APISO): 84 U/L (ref 40–115)
ALT: 36 U/L (ref 9–46)
AST: 19 U/L (ref 10–35)
BILIRUBIN TOTAL: 0.6 mg/dL (ref 0.2–1.2)
BUN: 16 mg/dL (ref 7–25)
CHLORIDE: 92 mmol/L — AB (ref 98–110)
CO2: 37 mmol/L — ABNORMAL HIGH (ref 20–32)
CREATININE: 0.79 mg/dL (ref 0.70–1.25)
Calcium: 9.4 mg/dL (ref 8.6–10.3)
GFR, Est African American: 110 mL/min/{1.73_m2} (ref >=60)
GFR, Est Non African American: 95 mL/min/{1.73_m2} (ref >=60)
GLOBULIN: 2.2 g/dL (ref 1.9–3.7)
GLUCOSE: 157 mg/dL — AB (ref 65–99)
Potassium: 4.6 mmol/L (ref 3.5–5.3)
SODIUM: 138 mmol/L (ref 135–146)
Total Protein: 5.8 g/dL — ABNORMAL LOW (ref 6.1–8.1)

## 2017-08-19 MED ORDER — NYSTATIN 100000 UNIT/ML MT SUSP: 5.0000 mL | Freq: Four times a day (QID) | OROMUCOSAL | 0 refills | Status: DC

## 2017-08-19 MED ORDER — LOSARTAN POTASSIUM 100 MG PO TABS
100.0000 mg | ORAL_TABLET | Freq: Every day | ORAL | 3 refills | Status: DC
Start: 2017-08-19 — End: 2018-10-24
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
## 2017-08-22 DIAGNOSIS — I2783 Eisenmenger's syndrome: Secondary | ICD-10-CM | POA: Diagnosis not present

## 2017-08-22 DIAGNOSIS — B370 Candidal stomatitis: Secondary | ICD-10-CM | POA: Diagnosis not present

## 2017-08-22 DIAGNOSIS — I5032 Chronic diastolic (congestive) heart failure: Secondary | ICD-10-CM | POA: Diagnosis not present

## 2017-08-22 DIAGNOSIS — J449 Chronic obstructive pulmonary disease, unspecified: Secondary | ICD-10-CM | POA: Diagnosis not present

## 2017-08-22 DIAGNOSIS — J441 Chronic obstructive pulmonary disease with (acute) exacerbation: Secondary | ICD-10-CM | POA: Diagnosis not present

## 2017-08-22 DIAGNOSIS — J189 Pneumonia, unspecified organism: Secondary | ICD-10-CM | POA: Diagnosis not present

## 2017-08-22 DIAGNOSIS — R0902 Hypoxemia: Secondary | ICD-10-CM | POA: Diagnosis not present

## 2017-08-22 DIAGNOSIS — G479 Sleep disorder, unspecified: Secondary | ICD-10-CM | POA: Diagnosis not present

## 2017-08-24 ENCOUNTER — Other Ambulatory Visit: Payer: Self-pay | Admitting: Family Medicine

## 2017-08-24 DIAGNOSIS — Z79891 Long term (current) use of opiate analgesic: Secondary | ICD-10-CM | POA: Diagnosis not present

## 2017-08-24 DIAGNOSIS — G894 Chronic pain syndrome: Secondary | ICD-10-CM | POA: Diagnosis not present

## 2017-08-26 DIAGNOSIS — J449 Chronic obstructive pulmonary disease, unspecified: Secondary | ICD-10-CM | POA: Diagnosis not present

## 2017-08-26 DIAGNOSIS — G479 Sleep disorder, unspecified: Secondary | ICD-10-CM | POA: Diagnosis not present

## 2017-08-26 DIAGNOSIS — R0902 Hypoxemia: Secondary | ICD-10-CM | POA: Diagnosis not present

## 2017-08-26 DIAGNOSIS — I2783 Eisenmenger's syndrome: Secondary | ICD-10-CM | POA: Diagnosis not present

## 2017-09-02 ENCOUNTER — Ambulatory Visit: Payer: Medicare HMO | Admitting: Family Medicine

## 2017-09-08 ENCOUNTER — Encounter: Payer: Self-pay | Admitting: Family Medicine

## 2017-09-08 ENCOUNTER — Other Ambulatory Visit: Payer: Self-pay | Admitting: Internal Medicine

## 2017-09-08 ENCOUNTER — Ambulatory Visit (INDEPENDENT_AMBULATORY_CARE_PROVIDER_SITE_OTHER): Payer: Medicare HMO | Admitting: Family Medicine

## 2017-09-08 VITALS — BP 110/58 | HR 112 | Temp 99.0°F | Resp 22 | Ht 63.0 in | Wt 245.0 lb

## 2017-09-08 DIAGNOSIS — G479 Sleep disorder, unspecified: Secondary | ICD-10-CM | POA: Diagnosis not present

## 2017-09-08 DIAGNOSIS — Z23 Encounter for immunization: Secondary | ICD-10-CM

## 2017-09-08 DIAGNOSIS — J449 Chronic obstructive pulmonary disease, unspecified: Secondary | ICD-10-CM

## 2017-09-08 DIAGNOSIS — I2783 Eisenmenger's syndrome: Secondary | ICD-10-CM | POA: Diagnosis not present

## 2017-09-08 DIAGNOSIS — R0902 Hypoxemia: Secondary | ICD-10-CM | POA: Diagnosis not present
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
## 2017-09-08 NOTE — Addendum Note (Signed)
Addended by: Legrand Rams B on: 09/08/2017 04:58 PM   Modules accepted: Orders

## 2017-09-09 ENCOUNTER — Encounter: Payer: Self-pay | Admitting: Internal Medicine

## 2017-09-09 ENCOUNTER — Ambulatory Visit (INDEPENDENT_AMBULATORY_CARE_PROVIDER_SITE_OTHER): Payer: Medicare HMO | Admitting: Internal Medicine

## 2017-09-09 VITALS — BP 104/60 | HR 94 | Ht 63.0 in | Wt 246.0 lb

## 2017-09-09 DIAGNOSIS — J449 Chronic obstructive pulmonary disease, unspecified: Secondary | ICD-10-CM

## 2017-09-09 DIAGNOSIS — J9612 Chronic respiratory failure with hypercapnia: Secondary | ICD-10-CM | POA: Diagnosis not present

## 2017-09-09 DIAGNOSIS — R05 Cough: Secondary | ICD-10-CM | POA: Diagnosis not present

## 2017-09-09 DIAGNOSIS — J9611 Chronic respiratory failure with hypoxia: Secondary | ICD-10-CM | POA: Diagnosis not present

## 2017-09-09 DIAGNOSIS — R058 Other specified cough: Secondary | ICD-10-CM

## 2017-09-09 NOTE — Assessment & Plan Note (Signed)
Body mass index is 43.58 kg/m.  -  trending down/ congratulated and reinforced  Lab Results  Component Value Date   TSH 1.72 11/01/2016     Contributing to gerd risk/ doe/reviewed the need and the process to achieve and maintain neg calorie balance > defer f/u primary care including intermittently monitoring thyroid status

## 2017-09-09 NOTE — Assessment & Plan Note (Signed)
-   Allergy profile 03/11/16  >  Eos 0.2 /  IgE  359 with pos RAST grass / trees  - flutter valve 08/11/16  Trial of neurontin 100 tid 09/01/2016    - Sinus CT 09/03/2016 >>> Visualized paranasal sinuses are clear. No air-fluid levels or mucosal thickening. Orbital soft tissues and visualized bony structures unremarkable. - Kozlow eval 09/21/16 rec consider immunotherapy - improved on gabapentin 300 tid as of ov 07/04/2017 > continue at present levels

## 2017-09-09 NOTE — Patient Instructions (Addendum)
See calendar for specific medication instructions and bring it back for each and every office visit for every healthcare provider you see.  Without it,  you may not receive the best quality medical care that we feel you deserve.  You will note that the calendar groups together  your maintenance  medications that are timed at particular times of the day.  Think of this as your checklist for what your doctor has instructed you to do until your next evaluation to see what benefit  there is  to staying on a consistent group of medications intended to keep you well.  The other group at the bottom is entirely up to you to use as you see fit  for specific symptoms that may arise between visits that require you to treat them on an as needed basis.  Think of this as your action plan or "what if" list.   Separating the top medications from the bottom group is fundamental to providing you adequate care going forward.    See Tammy NP 6 weeks with all your medications, even over the counter meds, separated in two separate bags, the ones you take no matter what vs the ones you stop once you feel better and take only as needed when you feel you need them.   Tammy  will generate for you a new user friendly medication calendar that will put [place] all on the same page re: your medication use.    Once you have seen Tammy and we are sure that we're all on the same page with your medication use she will arrange follow up with me.

## 2017-09-09 NOTE — Progress Notes (Signed)
Subjective:    Patient ID: [name], male   DOB: September 26, 1951,  MRN: [id number]     HPI 66  yo quit smoking  2012   with GOLD 2 COPD    02/09/2016 Follow up : COPD Exacerbation/PNA  Pt returns for follow up for COPD exacerbation  Recent admission for COPD flare and PNA .  He was treated with abx. And diuresis  He is feeling better. Decreased cough and congestion .  CXR today shows stable basilar density c/w probable scarring.  Remains on Symbicort and Spiriva .  On O2 at 3l/m . [place] marathon works well.  Vaccines utd.  Denies chest pain, orthopnea, edema or hemoptysis.        Immunization History  Administered Date(s) Administered  . Influenza Split 10/25/2011  . Influenza Whole 09/10/2009, 09/04/2010, 09/12/2012  . Influenza,inj,Quad PF,36+ Mos 10/17/2013, 09/25/2014, 10/22/2015  . Pneumococcal Conjugate-13 01/28/2015  . Pneumococcal Polysaccharide-23 12/13/2006    Tests: PFT 12/24/09>>FEV1 1.60(57%), FEV1% 52, TLC 6.86(130%), DLCO 67%, no BD PSG with 3 liters oxygen 06/12/12>>AHI 1.2, SpO2 low 90%, PLMI 1. Echo 09/27/12>>EF 55 to 60%, mild RA dilation Echo 12/2015 EF 55-60%, Gr 1 DD       rec Continue on Symbicort and Spiriva .  Low salt diet  Continue on Oxygen 3l/m    03/11/2016 acute extended ov/Previn Jian re:  Chief Complaint  Patient presents with  . Acute Visit    increased SOB x3 days with decreased mucus production (clear/milky), tightness, occasional wheezing.  takes 3x as long to recover from exertion.  denies any f/c/s, hemoptysis   was "better"   on 3lpm 24/7 and neb 3 x neb despite symbicort/spiriva>>  then worse x 3 days assoc with dysphagia despite taking carafate qid rec Plan A = Automatic = Symbicort 160 Take 2 puffs first thing in am and then another 2 puffs about 12 hours later.                                      Spiriva 2 pffs and Pantoprazole 40 mg Take 30-60 min before first meal of the day plus continue  zantac at  bedtime  Plan B = Backup Only use your albuterol (proair) as a rescue medication  Plan C = Crisis - only use your albuterol nebulizer if you first try Plan B and it fails to help > ok to use the nebulizer up to every 4 hours but if start needing it regularly call for immediate appointment Prednisone 10 mg take  4 each am x 2 days,   2 each am x 2 days,  1 each am x 2 days and stop  GERD  Diet     04/28/2016 acute ext ov/Gyselle Matthew re: GOLD II at baseline/aecopd maint rx symb 160 2bid/ spriiva respimat Chief Complaint  Patient presents with  . Acute Visit    Pt c/o increased SOB, cough with milky mucus, increased HR and decreased O2 with exertion. Pt denies CP/tightness, f/n/v. Pt has been using flutter valve daily.   back to baseline and not needing much saba at all until sob/cough worse one week prior to OV  - has futter not using  rec Plan A = Automatic = Symbicort 160 Take 2 puffs first thing in am and then another 2 puffs about 12 hours later.   Spiriva 2 pffs and Pantoprazole 40 mg Take 30-60 min before first meal of  the day plus continue  zantac at bedtime  Plan B = Backup Only use your albuterol (proair)  Plan C = Crisis - only use your albuterol nebulizer if you first try Plan B and it fails to help > ok to use the nebulizer up to every 4 hours but if start needing it regularly call for immediate appointment For cough > mucinex dm 1200 mg every 12 hours and supplement with norco if still coughing and use the flutter as much as you can zpak Prednisone 10 mg take  4 each am x 2 days,   2 each am x 2 days,  1 each am x 2 days and stop  GERD diet    08/11/2016  Acute ov/Aneisha Skyles re:  AECOPD/ GOLD II symb/spiriva/singulair  Chief Complaint  Patient presents with  . Acute Visit    Pt c/o increased cough and SOB for the past 2 days. He is coughing up large amounts of clear/milky sputum.    at baseline maybe one saba hfa / 02 3lpm 24/7 Already on norco 4 x daily at baseline Abrupt onset x 2  days worse sob with  Cough> milky sputum only, nothing really purulent  Lots of lifesavers but mint  Comfortable at rest p saba   rec Plan A = Automatic = Continue Symbicort 160 Take 2 puffs first thing in am and then another 2 puffs about 12 hours later. Spiriva 2 pffs and Pantoprazole 40 mg Take 30-60 min before first meal of the day plus continue  zantac at bedtime  Plan B = Backup Only use your albuterol (proair) as a rescue medication Plan C = Crisis - only use your albuterol nebulizer if you first try Plan B and it fails to help > ok to use the nebulizer up to every 4 hours but if start needing it regularly call for immediate appointment For cough > mucinex dm 1200 mg every 12 hours and supplement with norco if still coughing and use the flutter as much as you can zpak Prednisone 10 mg take  4 each am x 2 days,   2 each am x 2 days,  1 each am x 2 days and stop  GERD diet  Please remember to go to the  x-ray department downstairs for your tests - we will call you with the results when they are available.  if can't get comfortable at rest after the neb will need to go to ER    09/01/2016  Acute extended ov/Joette Schmoker re: onset of flare cough [facility]so[facility]  [facility]  Chief Complaint  Patient presents with  . Acute Visit    Pt c/o increase in SOB x 4 days. C/o prod cough with clear to white mucus and chest tightness. Pt states his O2 has been in the 80's. Pt denies f/c/s.   did improve transiently p last ov but never back to baseline then worse x 4 days with severe hacking cough/ min productive all white mucus and increase saba over baseline. Not using flutter valve or mucinex dm as instructed bu taking norc qid for back pain    >>rx Gabapentin for cough , pred taper , CT sinus >neg     09/20/2016 NP Follow up : GOLD II COPD /O2 3 L, AR w/ positive Rast /High IgE. ., UACS   Patient returns for a two-week follow-up for recent COPD flare. He was set up for a CT sinus that showed no  evidence of acute sinusitis. He  was given a prednisone taper and started Gabapentin  Three times a day   He is starting to feel some better. Cough is decreased some.  We reviewed all his med and organized them into a med calendar with pt education .  Talked about cost of inhaler . We discussed starting patient assistance papers.  rec Follow med calendar closely and bring to each visit.  Continue on current regimen   09/21/16 Kozlow eval > not tested as on antihistamines> consider low dose maint pred     02/28/2017  f/u ov/Tyner Codner re:  Copd II/ AB with MO / 02 dep  Chief Complaint  Patient presents with  . Follow-up    Needs o2 recert. Pt reports his breathing has been worse for the past 5 days. He has also noticed wheezing.    on 02 walking 3lpm = MMRC3 = can't walk 100 yards even at a slow pace at a flat grade s stopping due to sob  Even on 02 3lpm Neb typically needed once daily after supper  Has pred as part of action plan but not following action plan rec Follow med calendar   04/12/2017  f/u ov/Abbigal Radich re:  Copd II/ AB with MO and 02 dep 3lpm 24/7 maint rx symb/spirva with very good hfa/smi Chief Complaint  Patient presents with  . Follow-up    Pt feels like he is having increase sob with exertion, feels like he is getting sob faster, coughing but unable to produce anything, has some chest congestion, Last dose of prednisone was friday  prednisone worked after only a few doses but finished last dose 04/08/17 and did not follow action plan re restart prn increase sob or need for saba rec Doxycycline 100 mg twice daily x 10 days Elevate the leg as much as you can above the level of your heart Follow instructions re use of prednisone on your med calendar  Take extra furosemide if leg swelling worse than usual        07/04/2017  f/u ov/Charae Depaolis re:  COPD II/ AB with MO > hypercarbic resp failure on 4lpm 24/7  Chief Complaint  Patient presents with  . Follow-up    Breathing not  improving. He states it seems to be taking him longer to recover. He is wheezing some. His cough is unchanged. He is using proiar 4 x daily on average and neb with albuterol 2 x daily on average.    presently prednisone is 20 mg per day and worse when goes straight ot 10 rec Prednisone 10 mg 2 daily until better then alternate 2 with 1 (even vs odd days)  Then after a week 1 daily  Remember to purse lips when you breathe  out  See calendar for specific medication instructions and bring it back for each and every office visit     08/18/17 NP eval  Continue on Symbicort and Spiriva . Rinse after use.  Taper prednisone as directed to  alternating with  daily .  Mycelix 5 times a day for 1 week .  Continue on Oxygen 4l/m .       09/09/2017  f/u ov/Janira Mandell re:  Copd II/ ab steroid dep/ 02 dep/ MO > hypercarbic/ forgot med calendar again  Chief Complaint  Patient presents with  . Hospitalization Follow-up    Breathing is overall doing well.   found out that avoiding bending over decreased need for rescue saba  Sleeping in recliner x years no change   Doe = MMRC3 =  can't walk 100 yards even at a slow pace at a flat grade s stopping due to sob  Even on 4lpm   No obvious day to day or daytime variability or assoc excess/ purulent sputum or mucus plugs or hemoptysis or cp or chest tightness, subjective wheeze or overt sinus or hb symptoms. No unusual exp hx or h/o childhood pna/ asthma or knowledge of premature birth.  Sleeping at 45 degrees ok on l4pm without nocturnal  or early am exacerbation  of respiratory  c/o's or need for noct saba. Also denies any obvious fluctuation of symptoms with weather or environmental changes or other aggravating or alleviating factors except as outlined above   Current Allergies, Complete Past Medical History, Past Surgical History, Family History, and Social History were reviewed in [employer] record.  ROS  The following are not  active complaints unless bolded sore throat, dysphagia, dental problems, itching, sneezing,  nasal congestion or disharge of excess mucus or purulent secretions, ear ache,   fever, chills, sweats, unintended wt loss or wt gain, classically pleuritic or exertional cp,  orthopnea pnd or leg swelling, presyncope, palpitations, abdominal pain, anorexia, nausea, vomiting, diarrhea  or change in bowel habits or bladder habits, change in stools or change in urine, dysuria, hematuria,  rash, arthralgias, visual complaints, headache, numbness, weakness or ataxia or problems with walking or coordination,  change in mood/affect or memory.        Current Meds  Medication Sig  . albuterol (PROAIR HFA) 108 (90 Base) MCG/ACT inhaler Inhale 1-2 puffs into the lungs every 4 (four) hours as needed for wheezing or shortness of breath.  Marland Kitchen albuterol (PROVENTIL) (2.5 MG/3ML) 0.083% nebulizer solution Take 3 mLs (2.5 mg total) by nebulization every 6 (six) hours as needed for wheezing or shortness of breath.  Marland Kitchen atorvastatin (LIPITOR) 10 MG tablet TAKE 0.5 TABLETS (5 MG TOTAL) BY MOUTH DAILY.  . budesonide-formoterol (SYMBICORT) 160-4.5 MCG/ACT inhaler INHALE 2 PUFFS INTO LUNGS TWICE DAILY  . cetirizine (ZYRTEC) 10 MG tablet Take 10 mg by mouth daily as needed (drainage).   . clotrimazole (LOTRIMIN) 1 % cream Apply 1 application topically 2 (two) times daily as needed (rash).   . diclofenac (VOLTAREN) 75 MG EC tablet Take 1 tablet (75 mg total) by mouth 2 (two) times daily.  . DULoxetine (CYMBALTA) 60 MG capsule Take 1 capsule by mouth daily before breakfast.   . fluticasone (FLONASE) 50 MCG/ACT nasal spray Place 2 sprays into both nostrils daily.  . furosemide (LASIX) 40 MG tablet Take 3 tablets (120 mg total) by mouth 2 (two) times daily. 3 tabs in the morning and 2 in the afternoon  . gabapentin (NEURONTIN) 300 MG capsule TAKE 1 CAPSULE (300 MG TOTAL) BY MOUTH 3 (THREE) TIMES DAILY.  Marland Kitchen HYDROcodone-acetaminophen (NORCO)  10-325 MG tablet Take 1 tablet by mouth every 4 (four) hours as needed (cough not responding to mucinex dm). (Patient taking differently: Take 1 tablet by mouth every 6 (six) hours as needed (for pain). )  . KLOR-CON M20 20 MEQ tablet TAKE ONE TABLET BY MOUTH DAILY  . levothyroxine (SYNTHROID, LEVOTHROID) 200 MCG tablet TAKE 1 TABLET BY MOUTH EVERY DAY  . LORazepam (ATIVAN) 0.5 MG tablet Take 1 tablet (0.5 mg total) by mouth every 8 (eight) hours as needed. (Patient taking differently: Take 0.5 mg by mouth every 8 (eight) hours as needed for anxiety. )  . losartan (COZAAR) 100 MG tablet Take 1 tablet (100 mg total) by mouth daily.  Marland Kitchen  Misc. Devices (ACAPELLA) MISC Dx 496  . montelukast (SINGULAIR) 10 MG tablet TAKE 1 TABLET (10 MG TOTAL) BY MOUTH AT BEDTIME.  Marland Kitchen nystatin (MYCOSTATIN) 100000 UNIT/ML suspension TAKE 5 MLS BY MOUTH 4 TIMES A DAY.  Marland Kitchen OXYGEN Inhale 4 L into the lungs continuous.   . pantoprazole (PROTONIX) 40 MG tablet Take 1 tablet (40 mg total) by mouth daily.  . predniSONE (DELTASONE) 20 MG tablet Take 2 tablets ( ) once a day for 5 days, then reduce to 1 tablet ( ) a day thereafter until otherwise instructed by Dr. Sherene Sires  . ranitidine (ZANTAC) 150 MG tablet Take 150 mg by mouth at bedtime.    . sucralfate (CARAFATE) 1 g tablet TAKE 1 TABLET FOUR TIMES A DAY  . tamsulosin (FLOMAX) 0.4 MG CAPS capsule TAKE 1 CAPSULE (0.4 MG TOTAL) BY MOUTH DAILY.  Marland Kitchen Tiotropium Bromide Monohydrate (SPIRIVA RESPIMAT) 2.5 MCG/ACT AERS 2 puffs each am  . tiZANidine (ZANAFLEX) 4 MG capsule Take 4 mg by mouth 2 (two) times daily.                   Objective:   Physical Exam  Obese amb wm nad classic pseudowheeze better with plm    09/09/2017       246  07/04/2017       253 04/12/2017         257  02/28/2017       256  12/27/2016       250  11/15/2016        250  10/15/16 248 lb 3.2 oz (112.6 kg)  09/21/16 251 lb 3.2 oz (113.9 kg)  09/20/16 248 lb (112.5 kg)    Vital signs reviewed - Note on  arrival 02 sats  92% on 4lpm     HEENT: nl   turbinates, and oropharynx. Nl external ear canals without cough reflex -  Poor dentition L lower molar/ top dentures    NECK :  without JVD/Nodes/TM/ nl carotid upstrokes bilaterally   LUNGS: no acc muscle use,  Decreased bs in both bases with no wheeze with plm   CV:  RRR  no s3 or murmur or increase in P2,  Trace bilateral pitting edema sym lower ext  ABD: still  tensely  obese/ large pannus/ soft and nontender with nl inspiratory excursion in the supine position. No bruits or organomegaly, bowel sounds nl  MS:  Nl gait/ ext warm without deformities, calf tenderness, cyanosis or clubbing No obvious joint restrictions   SKIN: warm and dry    NEURO:  alert, approp, nl sensorium with  no motor deficits        I personally reviewed images and agree with radiology impression as follows:  CXR:   08/10/17 Hyperinflation.  Bibasilar atelectasis.

## 2017-09-09 NOTE — Assessment & Plan Note (Signed)
HCO3  08/14/17  38  C/w hypercarbia  HC03   08/19/17  37    As of 09/09/2017 rx =   4lpm

## 2017-09-09 NOTE — Assessment & Plan Note (Signed)
Spirometry 10/15/2016  FEV1 1.41 (52%)  Ratio 61 p am symb/spiriva/saba w/in 4h - added prednisone 20 max and taper off for flares as part of action plan 10/15/2016   04/12/2017  After extensive coaching HFA effectiveness =    90% from a baseline of 90%  - 07/04/2017 changed to pred 20 ceiling and 10 mg floor   Finally better compensated and losing wt despite need for pred - The goal with a chronic steroid dependent illness is always arriving at the lowest effective dose that controls the disease/symptoms and not accepting a set "formula" which is based on statistics or guidelines that don't always take into account patient  variability or the natural hx of the dz in every individual patient, which may well vary over time.  For now therefore I recommend the patient maintain  Ceiling of 20 mg daily and a floor of 10 mg daily   I had an extended discussion with the patient reviewing all relevant studies completed to date and  lasting 15 to 20 minutes of a 25 minute visit    Each maintenance medication was reviewed in detail including most importantly the difference between maintenance and prns and under what circumstances the prns are to be triggered using an action plan format that is not reflected in the computer generated alphabetically organized AVS but trather by a newly generated customized med calendar that reflects the AVS meds with confirmed 100% correlation.   In addition, Please see AVS for unique instructions that I personally wrote and verbalized to the the pt in detail and then reviewed with pt  by my nurse highlighting any  changes in therapy recommended at today's visit to their plan of care.

## 2017-09-15 ENCOUNTER — Other Ambulatory Visit: Payer: Self-pay | Admitting: Internal Medicine

## 2017-09-20 ENCOUNTER — Other Ambulatory Visit: Payer: Self-pay | Admitting: Family Medicine

## 2017-09-21 DIAGNOSIS — B370 Candidal stomatitis: Secondary | ICD-10-CM | POA: Diagnosis not present

## 2017-09-21 DIAGNOSIS — J441 Chronic obstructive pulmonary disease with (acute) exacerbation: Secondary | ICD-10-CM | POA: Diagnosis not present

## 2017-09-21 DIAGNOSIS — J449 Chronic obstructive pulmonary disease, unspecified: Secondary | ICD-10-CM | POA: Diagnosis not present

## 2017-09-21 DIAGNOSIS — J189 Pneumonia, unspecified organism: Secondary | ICD-10-CM | POA: Diagnosis not present

## 2017-09-21 DIAGNOSIS — R0902 Hypoxemia: Secondary | ICD-10-CM | POA: Diagnosis not present

## 2017-09-21 DIAGNOSIS — I5032 Chronic diastolic (congestive) heart failure: Secondary | ICD-10-CM | POA: Diagnosis not present

## 2017-09-21 DIAGNOSIS — I2783 Eisenmenger's syndrome: Secondary | ICD-10-CM | POA: Diagnosis not present

## 2017-09-21 DIAGNOSIS — G479 Sleep disorder, unspecified: Secondary | ICD-10-CM | POA: Diagnosis not present

## 2017-09-23 NOTE — Telephone Encounter (Signed)
done

## 2017-09-25 ENCOUNTER — Other Ambulatory Visit: Payer: Self-pay | Admitting: Family Medicine

## 2017-10-04 ENCOUNTER — Ambulatory Visit: Payer: Medicare HMO | Admitting: Adult Health

## 2017-10-05 ENCOUNTER — Other Ambulatory Visit: Payer: Self-pay | Admitting: Family Medicine

## 2017-10-05 MED ORDER — FUROSEMIDE 40 MG PO TABS: 80.0000 mg | ORAL_TABLET | Freq: Two times a day (BID) | ORAL | 2 refills | Status: DC

## 2017-10-08 ENCOUNTER — Other Ambulatory Visit: Payer: Self-pay | Admitting: Family Medicine

## 2017-10-08 DIAGNOSIS — J449 Chronic obstructive pulmonary disease, unspecified: Secondary | ICD-10-CM | POA: Diagnosis not present

## 2017-10-08 DIAGNOSIS — I2783 Eisenmenger's syndrome: Secondary | ICD-10-CM | POA: Diagnosis not present

## 2017-10-08 DIAGNOSIS — G479 Sleep disorder, unspecified: Secondary | ICD-10-CM | POA: Diagnosis not present

## 2017-10-08 DIAGNOSIS — R0902 Hypoxemia: Secondary | ICD-10-CM | POA: Diagnosis not present

## 2017-10-21 ENCOUNTER — Telehealth: Payer: Self-pay | Admitting: Internal Medicine

## 2017-10-21 ENCOUNTER — Encounter: Payer: Medicare HMO | Admitting: Adult Health

## 2017-10-21 MED ORDER — AZITHROMYCIN 250 MG PO TABS: ORAL_TABLET | ORAL | 0 refills | Status: DC

## 2017-10-21 NOTE — Telephone Encounter (Signed)
Spoke with pt, he states his grandson has a viral infection and daughter has a viral infection. Symptoms includes sneezing, headache, low grade fever, and coughing. He would like to see if he can get an antibiotic to have on hand just in case he gets sick. I explained to pt that ABX do not help with viruses. Please advise MW  [facility] [address]

## 2017-10-21 NOTE — Telephone Encounter (Signed)
Spoke with pt, aware of recs.  rx sent to preferred pharmacy.  Nothing further needed.  

## 2017-10-21 NOTE — Telephone Encounter (Signed)
Can have a zpak prn purulent sputum

## 2017-10-22 DIAGNOSIS — G479 Sleep disorder, unspecified: Secondary | ICD-10-CM | POA: Diagnosis not present

## 2017-10-22 DIAGNOSIS — B370 Candidal stomatitis: Secondary | ICD-10-CM | POA: Diagnosis not present

## 2017-10-22 DIAGNOSIS — I2783 Eisenmenger's syndrome: Secondary | ICD-10-CM | POA: Diagnosis not present

## 2017-10-22 DIAGNOSIS — J449 Chronic obstructive pulmonary disease, unspecified: Secondary | ICD-10-CM | POA: Diagnosis not present

## 2017-10-22 DIAGNOSIS — J189 Pneumonia, unspecified organism: Secondary | ICD-10-CM | POA: Diagnosis not present

## 2017-10-22 DIAGNOSIS — J441 Chronic obstructive pulmonary disease with (acute) exacerbation: Secondary | ICD-10-CM | POA: Diagnosis not present

## 2017-10-22 DIAGNOSIS — R0902 Hypoxemia: Secondary | ICD-10-CM | POA: Diagnosis not present

## 2017-10-22 DIAGNOSIS — I5032 Chronic diastolic (congestive) heart failure: Secondary | ICD-10-CM | POA: Diagnosis not present

## 2017-10-31 ENCOUNTER — Ambulatory Visit: Payer: Medicare HMO | Admitting: Adult Health

## 2017-10-31 ENCOUNTER — Encounter: Payer: Self-pay | Admitting: Adult Health

## 2017-10-31 DIAGNOSIS — I5032 Chronic diastolic (congestive) heart failure: Secondary | ICD-10-CM | POA: Diagnosis not present

## 2017-10-31 DIAGNOSIS — J449 Chronic obstructive pulmonary disease, unspecified: Secondary | ICD-10-CM

## 2017-10-31 DIAGNOSIS — J9611 Chronic respiratory failure with hypoxia: Secondary | ICD-10-CM

## 2017-10-31 MED ORDER — ALBUTEROL SULFATE (2.5 MG/3ML) 0.083% IN NEBU: 2.5000 mg | INHALATION_SOLUTION | Freq: Four times a day (QID) | RESPIRATORY_TRACT | 5 refills | Status: DC | PRN

## 2017-10-31 MED ORDER — MONTELUKAST SODIUM 10 MG PO TABS: ORAL_TABLET | ORAL | 1 refills | Status: DC

## 2017-10-31 MED ORDER — ALBUTEROL SULFATE HFA 108 (90 BASE) MCG/ACT IN AERS: 1.0000 | INHALATION_SPRAY | RESPIRATORY_TRACT | 1 refills | Status: DC | PRN

## 2017-10-31 MED ORDER — BUDESONIDE-FORMOTEROL FUMARATE 160-4.5 MCG/ACT IN AERO: INHALATION_SPRAY | RESPIRATORY_TRACT | 1 refills | Status: DC

## 2017-10-31 MED ORDER — TIOTROPIUM BROMIDE MONOHYDRATE 2.5 MCG/ACT IN AERS: 2.0000 | INHALATION_SPRAY | Freq: Every day | RESPIRATORY_TRACT | 1 refills | Status: DC

## 2017-10-31 NOTE — Assessment & Plan Note (Signed)
Cont on O2 .  
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
## 2017-10-31 NOTE — Addendum Note (Signed)
Addended by: Boone MasterJONES, JESSICA E on: 10/31/2017 05:30 PM   Modules accepted: Orders

## 2017-10-31 NOTE — Assessment & Plan Note (Signed)
Mild flare , improving with steroid bump and mucinex   Plan  Patient Instructions  Continue on Symbicort and Spiriva . Rinse after use.  Taper prednisone as directed to 20mg  alternating with 10mg  daily .  Zpack to have on hold if symptoms worsen with discolored mucus .  Continue on Oxygen 4l/m .  Follow up with Dr. Sherene SiresWert  In 2 months and As needed   Please contact office for sooner follow up if symptoms do not improve or worsen or seek emergency care

## 2017-10-31 NOTE — Progress Notes (Signed)
Chart and office note reviewed in detail  > agree with a/p as outlined    

## 2017-10-31 NOTE — Patient Instructions (Signed)
Continue on Symbicort and Spiriva . Rinse after use.  Taper prednisone as directed to 20mg  alternating with 10mg  daily .  Zpack to have on hold if symptoms worsen with discolored mucus .  Continue on Oxygen 4l/m .  Follow up with Dr. Sherene SiresWert  In 2 months and As needed   Please contact office for sooner follow up if symptoms do not improve or worsen or seek emergency care

## 2017-10-31 NOTE — Assessment & Plan Note (Signed)
Appears compensated  Cont on current regimen .  

## 2017-11-07 ENCOUNTER — Other Ambulatory Visit: Payer: Self-pay | Admitting: Internal Medicine

## 2017-11-07 MED ORDER — PANTOPRAZOLE SODIUM 40 MG PO TBEC: 40.0000 mg | DELAYED_RELEASE_TABLET | Freq: Every day | ORAL | 3 refills | Status: AC

## 2017-11-08 DIAGNOSIS — I2783 Eisenmenger's syndrome: Secondary | ICD-10-CM | POA: Diagnosis not present

## 2017-11-08 DIAGNOSIS — G479 Sleep disorder, unspecified: Secondary | ICD-10-CM | POA: Diagnosis not present

## 2017-11-08 DIAGNOSIS — R0902 Hypoxemia: Secondary | ICD-10-CM | POA: Diagnosis not present

## 2017-11-08 DIAGNOSIS — J449 Chronic obstructive pulmonary disease, unspecified: Secondary | ICD-10-CM | POA: Diagnosis not present

## 2017-11-09 ENCOUNTER — Telehealth: Payer: Self-pay | Admitting: Adult Health

## 2017-11-09 MED ORDER — ALBUTEROL SULFATE HFA 108 (90 BASE) MCG/ACT IN AERS: 1.0000 | INHALATION_SPRAY | RESPIRATORY_TRACT | 3 refills | Status: DC | PRN

## 2017-11-09 MED ORDER — LORAZEPAM 0.5 MG PO TABS: 0.5000 mg | ORAL_TABLET | Freq: Three times a day (TID) | ORAL | 0 refills | Status: DC | PRN

## 2017-11-09 NOTE — Telephone Encounter (Signed)
Lorazepam refill  Last refill 07/08/2017 Last OV 08/18/2017

## 2017-11-09 NOTE — Telephone Encounter (Signed)
[facility]ed in [facility] to [facility] [address] and sent the [employer] to [facility]. Daughter notified and nothing further is needed.

## 2017-11-09 NOTE — Telephone Encounter (Signed)
Lorazepam 0.5mg   Refill. MW   Last RX  07/08/2017  Last OV 08/18/2017 See calendar for specific medication instructions and bring it back for each and every office visit for every healthcare provider you see.  Without it,  you may not receive the best quality medical care that we feel you deserve.  You will note that the calendar groups together  your maintenance  medications that are timed at particular times of the day.  Think of this as your checklist for what your doctor has instructed you to do until your next evaluation to see what benefit  there is  to staying on a consistent group of medications intended to keep you well.  The other group at the bottom is entirely up to you to use as you see fit  for specific symptoms that may arise between visits that require you to treat them on an as needed basis.  Think of this as your action plan or "what if" list.   Separating the top medications from the bottom group is fundamental to providing you adequate care going forward.    See Tammy NP 6 weeks with all your medications, even over the counter meds, separated in two separate bags, the ones you take no matter what vs the ones you stop once you feel better and take only as needed when you feel you need them.   Tammy  will generate for you a new user friendly medication calendar that will put us all on the same page re: your medication use.    Once you have seen Tammy and we are sure that we're all on the same page with your medication use she will arrange follow up with

## 2017-11-09 NOTE — Addendum Note (Signed)
Addended by: Cydney OkAUGUSTIN, Ivis Nicolson N on: 11/09/2017 05:00 PM   Modules accepted: Orders

## 2017-11-09 NOTE — Addendum Note (Signed)
Addended by: Cydney OkAUGUSTIN, Timber Marshman N on: 11/09/2017 05:14 PM   Modules accepted: Orders

## 2017-11-09 NOTE — Telephone Encounter (Signed)
Ok to refill 

## 2017-11-13 ENCOUNTER — Other Ambulatory Visit: Payer: Self-pay | Admitting: Internal Medicine

## 2017-11-21 DIAGNOSIS — B370 Candidal stomatitis: Secondary | ICD-10-CM | POA: Diagnosis not present

## 2017-11-21 DIAGNOSIS — J441 Chronic obstructive pulmonary disease with (acute) exacerbation: Secondary | ICD-10-CM | POA: Diagnosis not present

## 2017-11-21 DIAGNOSIS — I5032 Chronic diastolic (congestive) heart failure: Secondary | ICD-10-CM | POA: Diagnosis not present

## 2017-11-21 DIAGNOSIS — J189 Pneumonia, unspecified organism: Secondary | ICD-10-CM | POA: Diagnosis not present

## 2017-11-21 DIAGNOSIS — J449 Chronic obstructive pulmonary disease, unspecified: Secondary | ICD-10-CM | POA: Diagnosis not present

## 2017-11-21 DIAGNOSIS — R0902 Hypoxemia: Secondary | ICD-10-CM | POA: Diagnosis not present

## 2017-11-21 DIAGNOSIS — G479 Sleep disorder, unspecified: Secondary | ICD-10-CM | POA: Diagnosis not present

## 2017-11-21 DIAGNOSIS — I2783 Eisenmenger's syndrome: Secondary | ICD-10-CM | POA: Diagnosis not present

## 2017-12-02 ENCOUNTER — Telehealth: Payer: Self-pay | Admitting: Internal Medicine

## 2017-12-02 MED ORDER — AZITHROMYCIN 250 MG PO TABS: ORAL_TABLET | ORAL | 0 refills | Status: DC

## 2017-12-02 NOTE — Telephone Encounter (Signed)
Patient aware of zpac sent to pharmacy listed. OV schedule 01-06-18 with MW for f/u. Patient is aware to seek ER care over holiday if worsens. Patient stated he will call Monday on Christmas Eve if no better.

## 2017-12-02 NOTE — Telephone Encounter (Signed)
Spoke with pt, he states he is congested and wanted to see if he could get a ZPAK. He was exposed to cold from his children. He has yellow mucus. Runny nose, and feels hot/cold.   I advised pt to go to the hospital (he was panting on the phone)  He advised me that this is his normal because he just came from the bathroom. He states if this gets worse he will go to the emergency room. Please advise MW  [facility]

## 2017-12-02 NOTE — Telephone Encounter (Signed)
zpak - needs ov with me next available with all meds and med calendar to regroup

## 2017-12-02 NOTE — Telephone Encounter (Signed)
Pt calling back [phone]

## 2017-12-04 ENCOUNTER — Other Ambulatory Visit: Payer: Self-pay | Admitting: Internal Medicine

## 2017-12-08 DIAGNOSIS — R0902 Hypoxemia: Secondary | ICD-10-CM | POA: Diagnosis not present

## 2017-12-08 DIAGNOSIS — G479 Sleep disorder, unspecified: Secondary | ICD-10-CM | POA: Diagnosis not present

## 2017-12-08 DIAGNOSIS — I2783 Eisenmenger's syndrome: Secondary | ICD-10-CM | POA: Diagnosis not present

## 2017-12-08 DIAGNOSIS — J449 Chronic obstructive pulmonary disease, unspecified: Secondary | ICD-10-CM | POA: Diagnosis not present

## 2017-12-12 DIAGNOSIS — I5032 Chronic diastolic (congestive) heart failure: Secondary | ICD-10-CM | POA: Diagnosis not present

## 2017-12-12 DIAGNOSIS — J189 Pneumonia, unspecified organism: Secondary | ICD-10-CM | POA: Diagnosis not present

## 2017-12-12 DIAGNOSIS — J449 Chronic obstructive pulmonary disease, unspecified: Secondary | ICD-10-CM | POA: Diagnosis not present

## 2017-12-12 DIAGNOSIS — G479 Sleep disorder, unspecified: Secondary | ICD-10-CM | POA: Diagnosis not present

## 2017-12-12 DIAGNOSIS — I2783 Eisenmenger's syndrome: Secondary | ICD-10-CM | POA: Diagnosis not present

## 2017-12-12 DIAGNOSIS — R0902 Hypoxemia: Secondary | ICD-10-CM | POA: Diagnosis not present

## 2017-12-22 DIAGNOSIS — B370 Candidal stomatitis: Secondary | ICD-10-CM | POA: Diagnosis not present

## 2017-12-22 DIAGNOSIS — I5032 Chronic diastolic (congestive) heart failure: Secondary | ICD-10-CM | POA: Diagnosis not present

## 2017-12-22 DIAGNOSIS — J449 Chronic obstructive pulmonary disease, unspecified: Secondary | ICD-10-CM | POA: Diagnosis not present

## 2017-12-22 DIAGNOSIS — G479 Sleep disorder, unspecified: Secondary | ICD-10-CM | POA: Diagnosis not present

## 2017-12-22 DIAGNOSIS — I2783 Eisenmenger's syndrome: Secondary | ICD-10-CM | POA: Diagnosis not present

## 2017-12-22 DIAGNOSIS — J189 Pneumonia, unspecified organism: Secondary | ICD-10-CM | POA: Diagnosis not present

## 2017-12-22 DIAGNOSIS — Z79899 Other long term (current) drug therapy: Secondary | ICD-10-CM | POA: Diagnosis not present

## 2017-12-22 DIAGNOSIS — M25512 Pain in left shoulder: Secondary | ICD-10-CM | POA: Diagnosis not present

## 2017-12-22 DIAGNOSIS — J441 Chronic obstructive pulmonary disease with (acute) exacerbation: Secondary | ICD-10-CM | POA: Diagnosis not present

## 2017-12-22 DIAGNOSIS — G894 Chronic pain syndrome: Secondary | ICD-10-CM | POA: Diagnosis not present

## 2017-12-22 DIAGNOSIS — M25511 Pain in right shoulder: Secondary | ICD-10-CM | POA: Diagnosis not present

## 2017-12-22 DIAGNOSIS — R0902 Hypoxemia: Secondary | ICD-10-CM | POA: Diagnosis not present

## 2017-12-23 ENCOUNTER — Telehealth: Payer: Self-pay | Admitting: Internal Medicine

## 2017-12-23 NOTE — Telephone Encounter (Signed)
Spoke with patient's daughter. She stated that we needed to call him on his cell phone.   Left message for patient to call back.

## 2017-12-24 MED ORDER — AZITHROMYCIN 250 MG PO TABS: ORAL_TABLET | ORAL | 0 refills | Status: DC

## 2017-12-24 NOTE — Telephone Encounter (Signed)
Daughter called, no Rx sent to her father's pharmacy Per last note it looks like Dr. Sherene SiresWert wanted [name] to take a Zpack [facility] daughter request

## 2017-12-24 NOTE — Telephone Encounter (Signed)
Mucus turning yellow over several days/ already on pred 10 x 2  rec zpak/ f/u in office by 12/30/17 if not better, to er if worse

## 2018-01-03 ENCOUNTER — Other Ambulatory Visit: Payer: Self-pay | Admitting: Internal Medicine

## 2018-01-03 MED ORDER — GABAPENTIN 300 MG PO CAPS: 300.0000 mg | ORAL_CAPSULE | Freq: Three times a day (TID) | ORAL | 0 refills | Status: DC

## 2018-01-04 ENCOUNTER — Other Ambulatory Visit: Payer: Self-pay | Admitting: Internal Medicine

## 2018-01-06 ENCOUNTER — Ambulatory Visit: Payer: Medicare HMO | Admitting: Internal Medicine

## 2018-01-06 ENCOUNTER — Encounter: Payer: Self-pay | Admitting: Internal Medicine

## 2018-01-06 VITALS — BP 118/60 | HR 94 | Ht 63.0 in | Wt 238.0 lb

## 2018-01-06 DIAGNOSIS — J9612 Chronic respiratory failure with hypercapnia: Secondary | ICD-10-CM

## 2018-01-06 DIAGNOSIS — J449 Chronic obstructive pulmonary disease, unspecified: Secondary | ICD-10-CM

## 2018-01-06 DIAGNOSIS — J9611 Chronic respiratory failure with hypoxia: Secondary | ICD-10-CM

## 2018-01-06 MED ORDER — AZITHROMYCIN 250 MG PO TABS: ORAL_TABLET | ORAL | 11 refills | Status: DC

## 2018-01-06 NOTE — Patient Instructions (Signed)
For nasty mucus >  Z pak refillable/ add to action plan  Make sure if you turn down your 02 you are still at or above 90% at all times  See calendar for specific medication instructions and bring it back for each and every office visit for every healthcare provider you see.  Without it,  you may not receive the best quality medical care that we feel you deserve.  You will note that the calendar groups together  your maintenance  medications that are timed at particular times of the day.  Think of this as your checklist for what your doctor has instructed you to do until your next evaluation to see what benefit  there is  to staying on a consistent group of medications intended to keep you well.  The other group at the bottom is entirely up to you to use as you see fit  for specific symptoms that may arise between visits that require you to treat them on an as needed basis.  Think of this as your action plan or "what if" list.   Separating the top medications from the bottom group is fundamental to providing you adequate care going forward.    .Marland Kitchen

## 2018-01-06 NOTE — Progress Notes (Signed)
Subjective:    Patient ID: [name], male   DOB: [birth year],  MRN: [id number]      Brief patient profile:  67  yo quit smoking  2012   with GOLD 2 COPD/ morbid obesity > chronic resp failure  With hypercarbia/hypoxemia     09/01/2016  Acute extended ov/Wert re: onset of flare cough /sob  early to [facility] august 2017  Chief Complaint  Patient presents with  . Acute Visit    Pt c/o increase in SOB x 4 days. C/o prod cough with clear to white mucus and chest tightness. Pt states his O2 has been in the 80's. Pt denies f/c/s.   did improve transiently p last ov but never back to baseline then worse x 4 days with severe hacking cough/ min productive all white mucus and increase saba over baseline. Not using flutter valve or mucinex dm as instructed bu taking norc qid for back pain    >>rx Gabapentin for cough , pred taper , CT sinus >neg     09/20/2016 NP Follow up : GOLD II COPD /O2 3 L, AR w/ positive Rast /High IgE. ., UACS   Patient returns for a two-week follow-up for recent COPD flare. He was set up for a CT sinus that showed no evidence of acute sinusitis. He was given a prednisone taper and started Gabapentin 100mg  Three times a day   He is starting to feel some better. Cough is decreased some.  We reviewed all his med and organized them into a med calendar with pt education .  Talked about cost of inhaler . We discussed starting patient assistance papers.  rec Follow med calendar closely and bring to each visit.  Continue on current regimen   09/21/16 Kozlow eval > not tested as on antihistamines> consider low dose maint pred     04/12/2017  f/u ov/Wert re:  Copd II/ AB with MO and 02 dep 3lpm 24/7 maint rx symb/spirva with very good hfa/smi Chief Complaint  Patient presents with  . Follow-up    Pt feels like he is having increase sob with exertion, feels like he is getting sob faster, coughing but unable to produce anything, has some chest congestion, Last dose of prednisone was  friday  prednisone worked after only a few doses but finished last dose 04/08/17 and did not follow action plan re restart prn increase sob or need for saba rec Doxycycline 100 mg twice daily x 10 days Elevate the leg as much as you can above the level of your heart Follow instructions re use of prednisone on your med calendar  Take extra furosemide if leg swelling worse than usual       10/31/17 NP Continue on Symbicort and Spiriva . Rinse after use.  Taper prednisone as directed to 20mg  alternating with 10mg  daily .  Zpack to have on hold if symptoms worsen with discolored mucus .  Continue on Oxygen 4l/m .    12/23/17 Phone care for flare of purulent cough > rec zpak    01/06/2018  f/u ov/Wert re:   02 3-4lpm  But only gold II criteria with hypercarbia c/w ohs component  Chief Complaint  Patient presents with  . Follow-up    f/u COPD, feels well after the abx, still coughing up yellow mucus,    doe = MMRC3 = can't walk 100 yards even at a slow pace at a flat grade s stopping due to sob  Even on 4lpm with adeq sats  documented/ zmax helped a lot at last flare / much less saba need and sputum only slightly yellow using flutter   No obvious day to day or daytime variability or assoc   mucus plugs or hemoptysis or cp or chest tightness, subjective wheeze or overt sinus or hb symptoms. No unusual exposure hx or h/o childhood pna/ asthma or knowledge of premature birth.  Sleeping ok in recliner on 3lpm  without nocturnal  or early am exacerbation  of respiratory  c/o's or need for noct saba. Also denies any obvious fluctuation of symptoms with weather or environmental changes or other aggravating or alleviating factors except as outlined above   Current Allergies, Complete Past Medical History, Past Surgical History, Family History, and Social History were reviewed in [facility] electronic medical record.  ROS  The following are not active complaints unless bolded Hoarseness, sore  throat, dysphagia, dental problems, itching, sneezing,  nasal congestion or discharge of excess mucus or purulent secretions, ear ache,   fever, chills, sweats, unintended wt loss or wt gain, classically pleuritic or exertional cp,  orthopnea pnd or leg swelling, presyncope, palpitations, abdominal pain, anorexia, nausea, vomiting, diarrhea  or change in bowel habits or change in bladder habits, change in stools or change in urine, dysuria, hematuria,  rash, arthralgias, visual complaints, headache, numbness, weakness or ataxia or problems with walking or coordination,  change in mood/affect or memory.        Current Meds  Medication Sig  . albuterol (PROAIR HFA) 108 (90 Base) MCG/ACT inhaler Inhale 1-2 puffs into the lungs every 4 (four) hours as needed for wheezing or shortness of breath.  Marland Kitchen. albuterol (PROVENTIL) (2.5 MG/3ML) 0.083% nebulizer solution Take 3 mLs (2.5 mg total) every 6 (six) hours as needed by nebulization for wheezing or shortness of breath.  Marland Kitchen. atorvastatin (LIPITOR) 10 MG tablet TAKE 0.5 TABLETS (5 MG TOTAL) BY MOUTH DAILY.  Marland Kitchen. azithromycin (ZITHROMAX) 250 MG tablet Take 2 today, then 1 daily until gone.  . budesonide-formoterol (SYMBICORT) 160-4.5 MCG/ACT inhaler INHALE 2 PUFFS INTO LUNGS TWICE DAILY  . cetirizine (ZYRTEC) 10 MG tablet Take 10 mg by mouth daily as needed (drainage).   . clotrimazole (LOTRIMIN) 1 % cream Apply 1 application topically 2 (two) times daily as needed (rash).   . clotrimazole-betamethasone (LOTRISONE) cream APPLY TWICE A DAY FOR 10 TO 14 DAYS AS DIRECTED  . diclofenac (VOLTAREN) 75 MG EC tablet TAKE 1 TABLET (75 MG TOTAL) BY MOUTH 2 (TWO) TIMES DAILY.  . DULoxetine (CYMBALTA) 60 MG capsule Take 1 capsule by mouth daily before breakfast.   . fluticasone (FLONASE) 50 MCG/ACT nasal spray INHALE 2 SPRAYS INTO EACH NOSTIL EVERY DAY  . furosemide (LASIX) 40 MG tablet TAKE TWO TABLETS BY MOUTH TWICE DAILY  . gabapentin (NEURONTIN) 300 MG capsule Take 1  capsule (300 mg total) by mouth 3 (three) times daily.  Marland Kitchen. HYDROcodone-acetaminophen (NORCO) 10-325 MG tablet Take 1 tablet by mouth every 4 (four) hours as needed (cough not responding to mucinex dm). (Patient taking differently: Take 1 tablet by mouth every 6 (six) hours as needed (for pain). )  . KLOR-CON M20 20 MEQ tablet TAKE ONE TABLET BY MOUTH DAILY  . levothyroxine (SYNTHROID, LEVOTHROID) 200 MCG tablet TAKE 1 TABLET BY MOUTH EVERY DAY  . LORazepam (ATIVAN) 0.5 MG tablet Take 1 tablet (0.5 mg total) by mouth every 8 (eight) hours as needed.  Marland Kitchen. losartan (COZAAR) 100 MG tablet Take 1 tablet (100 mg total) by mouth daily.  .Marland Kitchen  Misc. Devices (ACAPELLA) MISC Dx 496  . montelukast (SINGULAIR) 10 MG tablet TAKE 1 TABLET (10 MG TOTAL) BY MOUTH AT BEDTIME.  Marland Kitchen nystatin (MYCOSTATIN) 100000 UNIT/ML suspension TAKE 5 MLS BY MOUTH 4 TIMES A DAY.  Marland Kitchen OXYGEN Inhale 4 L into the lungs continuous.   . pantoprazole (PROTONIX) 40 MG tablet Take 1 tablet (40 mg total) by mouth daily.  . predniSONE (DELTASONE) 10 MG tablet TAKE 2 TABLETS UNTIL BETTER,THEN 1 TAB DAILY-2 DAYS,THEN 1/2 TAB DAILY-2 DAYS,THEN STOP  . predniSONE (DELTASONE) 20 MG tablet Take 2 tablets (40mg ) once a day for 5 days, then reduce to 1 tablet (20mg ) a day thereafter until otherwise instructed by Dr. Sherene Sires  . ranitidine (ZANTAC) 150 MG tablet Take 150 mg by mouth at bedtime.    . sucralfate (CARAFATE) 1 g tablet TAKE 1 TABLET FOUR TIMES A DAY  . tamsulosin (FLOMAX) 0.4 MG CAPS capsule TAKE 1 CAPSULE (0.4 MG TOTAL) BY MOUTH DAILY.  Marland Kitchen Tiotropium Bromide Monohydrate (SPIRIVA RESPIMAT) 2.5 MCG/ACT AERS Inhale 2 puffs daily into the lungs.  Marland Kitchen tiZANidine (ZANAFLEX) 4 MG capsule Take 4 mg by mouth 2 (two) times daily.   . [DISCONTINUED] azithromycin (ZITHROMAX) 250 MG tablet Take 2 today, then 1 daily until gone.               Objective:   Physical Exam   obese wm nad    01/06/2018       238  09/09/2017       246  07/04/2017        253 04/12/2017         257  02/28/2017       256  12/27/2016       250  11/15/2016        250  10/15/16 248 lb 3.2 oz (112.6 kg)  09/21/16 251 lb 3.2 oz (113.9 kg)  09/20/16 248 lb (112.5 kg)    Vital signs reviewed - Note on arrival 02 sats  91% on 4lpm     Poor dentition L lower molar/ top dentures  Decreased bs in both bases with no wheeze with plm  no significant  pitting edema sym lower ext ABD: still  tensely  Obese    HEENT: nl   oropharynx. Nl external ear canals without cough reflex - moderate bilateral non-specific turbinate edema  s purulent secretions / poor lower dentition / top dentures   NECK :  without JVD/Nodes/TM/ nl carotid upstrokes bilaterally   LUNGS: no acc muscle use,  slt barrel chest with diminished bs bases, no wheeze with plm maneuver, some pseudowheeze on fvc s plm   CV:  RRR  no s3 or murmur or increase in P2, and no edema in lower ext today   ABD:  Tensely obese but  nontender with very limited  inspiratory excursion in the supine position. No bruits or organomegaly appreciated, bowel sounds nl  MS:  Nl gait/ ext warm without deformities, calf tenderness, cyanosis or clubbing No obvious joint restrictions   SKIN: warm and dry without lesions    NEURO:  alert, approp, nl sensorium with  no motor or cerebellar deficits apparent.

## 2018-01-07 ENCOUNTER — Encounter: Payer: Self-pay | Admitting: Internal Medicine

## 2018-01-07 NOTE — Assessment & Plan Note (Signed)
Spirometry 10/15/2016  FEV1 1.41 (52%)  Ratio 61 p am symb/spiriva/saba w/in 4h - added prednisone 20 max and taper off for flares as part of action plan 10/15/2016   - 07/04/2017 changed to pred 20 ceiling and 10 mg floor   - 01/06/2018  After extensive coaching inhaler device  effectiveness =    75% (short Ti)   Tendency to freq flares typical of  Group D in terms of symptom/risk and laba/lama/ICS  therefore appropriate rx at this point and will add short course zmak to action plan for flares of acute purulent bronchitis  I had an extended discussion with the patient reviewing all relevant studies completed to date and  lasting 15 to 20 minutes of a 25 minute visit    Each maintenance medication was reviewed in detail including most importantly the difference between maintenance and prns and under what circumstances the prns are to be triggered using an action plan format that is not reflected in the computer generated alphabetically organized AVS but trather by a customized med calendar that reflects the AVS meds with confirmed 100% correlation.   In addition, Please see AVS for unique instructions that I personally wrote and verbalized to the the pt in detail and then reviewed with pt  by my nurse highlighting any  changes in therapy recommended at today's visit to their plan of care.

## 2018-01-07 NOTE — Assessment & Plan Note (Signed)
HCO3  08/13/17  38  C/w hypercarbia   Ok to adjust down to 3lpm as long as sats are > 90% , critical that he check it walking to be be sure desats are not limiting him from doing more>> rec  "burning more calories" is the goal to get him healthier but can only do this with adequate sats

## 2018-01-08 DIAGNOSIS — J449 Chronic obstructive pulmonary disease, unspecified: Secondary | ICD-10-CM | POA: Diagnosis not present

## 2018-01-08 DIAGNOSIS — R0902 Hypoxemia: Secondary | ICD-10-CM | POA: Diagnosis not present

## 2018-01-08 DIAGNOSIS — I2783 Eisenmenger's syndrome: Secondary | ICD-10-CM | POA: Diagnosis not present

## 2018-01-08 DIAGNOSIS — G479 Sleep disorder, unspecified: Secondary | ICD-10-CM | POA: Diagnosis not present

## 2018-01-22 ENCOUNTER — Other Ambulatory Visit: Payer: Self-pay | Admitting: Family Medicine

## 2018-01-22 DIAGNOSIS — G479 Sleep disorder, unspecified: Secondary | ICD-10-CM | POA: Diagnosis not present

## 2018-01-22 DIAGNOSIS — J189 Pneumonia, unspecified organism: Secondary | ICD-10-CM | POA: Diagnosis not present

## 2018-01-22 DIAGNOSIS — J441 Chronic obstructive pulmonary disease with (acute) exacerbation: Secondary | ICD-10-CM | POA: Diagnosis not present

## 2018-01-22 DIAGNOSIS — I2783 Eisenmenger's syndrome: Secondary | ICD-10-CM | POA: Diagnosis not present

## 2018-01-22 DIAGNOSIS — J449 Chronic obstructive pulmonary disease, unspecified: Secondary | ICD-10-CM | POA: Diagnosis not present

## 2018-01-22 DIAGNOSIS — R0902 Hypoxemia: Secondary | ICD-10-CM | POA: Diagnosis not present

## 2018-01-22 DIAGNOSIS — B370 Candidal stomatitis: Secondary | ICD-10-CM | POA: Diagnosis not present

## 2018-01-22 DIAGNOSIS — I5032 Chronic diastolic (congestive) heart failure: Secondary | ICD-10-CM | POA: Diagnosis not present

## 2018-01-23 ENCOUNTER — Other Ambulatory Visit: Payer: Self-pay | Admitting: Internal Medicine

## 2018-01-23 MED ORDER — GABAPENTIN 300 MG PO CAPS: 300.0000 mg | ORAL_CAPSULE | Freq: Three times a day (TID) | ORAL | 0 refills | Status: DC

## 2018-01-24 ENCOUNTER — Other Ambulatory Visit: Payer: Self-pay | Admitting: Family Medicine

## 2018-01-24 MED ORDER — CLOTRIMAZOLE-BETAMETHASONE 1-0.05 % EX CREA: TOPICAL_CREAM | CUTANEOUS | 1 refills | Status: DC

## 2018-01-31 ENCOUNTER — Telehealth: Payer: Self-pay | Admitting: Adult Health

## 2018-01-31 MED ORDER — BUDESONIDE-FORMOTEROL FUMARATE 160-4.5 MCG/ACT IN AERO: INHALATION_SPRAY | RESPIRATORY_TRACT | 1 refills | Status: DC

## 2018-01-31 NOTE — Telephone Encounter (Signed)
[facility] 90 day supply of patient's Symbicort 160 Last ov with TP 11.19.18 Most recent ov 1.25.19 with MW  Refills sent as requested but will file under MW Nothing further needed; will sign off

## 2018-02-08 DIAGNOSIS — R0902 Hypoxemia: Secondary | ICD-10-CM | POA: Diagnosis not present

## 2018-02-08 DIAGNOSIS — J449 Chronic obstructive pulmonary disease, unspecified: Secondary | ICD-10-CM | POA: Diagnosis not present

## 2018-02-08 DIAGNOSIS — I2783 Eisenmenger's syndrome: Secondary | ICD-10-CM | POA: Diagnosis not present

## 2018-02-08 DIAGNOSIS — G479 Sleep disorder, unspecified: Secondary | ICD-10-CM | POA: Diagnosis not present

## 2018-02-15 ENCOUNTER — Other Ambulatory Visit: Payer: Self-pay | Admitting: Internal Medicine

## 2018-02-15 MED ORDER — ALBUTEROL SULFATE HFA 108 (90 BASE) MCG/ACT IN AERS: 1.0000 | INHALATION_SPRAY | RESPIRATORY_TRACT | 3 refills | Status: DC | PRN

## 2018-02-19 DIAGNOSIS — B370 Candidal stomatitis: Secondary | ICD-10-CM | POA: Diagnosis not present

## 2018-02-19 DIAGNOSIS — J189 Pneumonia, unspecified organism: Secondary | ICD-10-CM | POA: Diagnosis not present

## 2018-02-19 DIAGNOSIS — J449 Chronic obstructive pulmonary disease, unspecified: Secondary | ICD-10-CM | POA: Diagnosis not present

## 2018-02-19 DIAGNOSIS — I2783 Eisenmenger's syndrome: Secondary | ICD-10-CM | POA: Diagnosis not present

## 2018-02-19 DIAGNOSIS — J441 Chronic obstructive pulmonary disease with (acute) exacerbation: Secondary | ICD-10-CM | POA: Diagnosis not present

## 2018-02-19 DIAGNOSIS — R0902 Hypoxemia: Secondary | ICD-10-CM | POA: Diagnosis not present

## 2018-02-19 DIAGNOSIS — G479 Sleep disorder, unspecified: Secondary | ICD-10-CM | POA: Diagnosis not present

## 2018-02-19 DIAGNOSIS — I5032 Chronic diastolic (congestive) heart failure: Secondary | ICD-10-CM | POA: Diagnosis not present

## 2018-02-23 ENCOUNTER — Other Ambulatory Visit: Payer: Self-pay | Admitting: Internal Medicine

## 2018-02-23 NOTE — Telephone Encounter (Signed)
Pt is requesting a refill of Lorazepam 0.5mg . This was last refilled by MW on 11/09/17 #270. He was last seen by MW on 01/06/18.  MW - please advise. Thanks.

## 2018-02-23 NOTE — Telephone Encounter (Signed)
[facility] informing that pt that this was done

## 2018-03-01 ENCOUNTER — Other Ambulatory Visit: Payer: Self-pay | Admitting: Family Medicine

## 2018-03-01 MED ORDER — SUCRALFATE 1 G PO TABS: 1.0000 g | ORAL_TABLET | Freq: Four times a day (QID) | ORAL | 3 refills | Status: AC

## 2018-03-01 MED ORDER — TAMSULOSIN HCL 0.4 MG PO CAPS: ORAL_CAPSULE | ORAL | 3 refills | Status: AC

## 2018-03-08 DIAGNOSIS — R0902 Hypoxemia: Secondary | ICD-10-CM | POA: Diagnosis not present

## 2018-03-08 DIAGNOSIS — G479 Sleep disorder, unspecified: Secondary | ICD-10-CM | POA: Diagnosis not present

## 2018-03-08 DIAGNOSIS — I2783 Eisenmenger's syndrome: Secondary | ICD-10-CM | POA: Diagnosis not present

## 2018-03-08 DIAGNOSIS — J449 Chronic obstructive pulmonary disease, unspecified: Secondary | ICD-10-CM | POA: Diagnosis not present

## 2018-03-13 ENCOUNTER — Other Ambulatory Visit: Payer: Self-pay | Admitting: Family Medicine

## 2018-03-13 MED ORDER — FUROSEMIDE 40 MG PO TABS: ORAL_TABLET | ORAL | 3 refills | Status: AC

## 2018-03-22 DIAGNOSIS — B370 Candidal stomatitis: Secondary | ICD-10-CM | POA: Diagnosis not present

## 2018-03-22 DIAGNOSIS — J189 Pneumonia, unspecified organism: Secondary | ICD-10-CM | POA: Diagnosis not present

## 2018-03-22 DIAGNOSIS — R0902 Hypoxemia: Secondary | ICD-10-CM | POA: Diagnosis not present

## 2018-03-22 DIAGNOSIS — I2783 Eisenmenger's syndrome: Secondary | ICD-10-CM | POA: Diagnosis not present

## 2018-03-22 DIAGNOSIS — G479 Sleep disorder, unspecified: Secondary | ICD-10-CM | POA: Diagnosis not present

## 2018-03-22 DIAGNOSIS — J449 Chronic obstructive pulmonary disease, unspecified: Secondary | ICD-10-CM | POA: Diagnosis not present

## 2018-03-22 DIAGNOSIS — I5032 Chronic diastolic (congestive) heart failure: Secondary | ICD-10-CM | POA: Diagnosis not present

## 2018-03-22 DIAGNOSIS — J441 Chronic obstructive pulmonary disease with (acute) exacerbation: Secondary | ICD-10-CM | POA: Diagnosis not present

## 2018-03-27 ENCOUNTER — Telehealth: Payer: Self-pay | Admitting: Internal Medicine

## 2018-03-27 NOTE — Telephone Encounter (Signed)
Attempted to contact pt. I did not receive an answer. There was no option for me to leave a message. Will try back.  

## 2018-03-29 ENCOUNTER — Ambulatory Visit: Payer: Medicare HMO | Admitting: Internal Medicine

## 2018-04-03 ENCOUNTER — Ambulatory Visit: Payer: Medicare HMO | Admitting: Internal Medicine

## 2018-04-03 ENCOUNTER — Encounter: Payer: Self-pay | Admitting: Internal Medicine

## 2018-04-03 ENCOUNTER — Ambulatory Visit (INDEPENDENT_AMBULATORY_CARE_PROVIDER_SITE_OTHER)
Admission: RE | Admit: 2018-04-03 | Discharge: 2018-04-03 | Disposition: A | Discharge status: Home / Self Care | Payer: Medicare HMO | Source: Ambulatory Visit | Attending: Internal Medicine | Admitting: Internal Medicine

## 2018-04-03 VITALS — BP 120/66 | HR 106 | Ht 63.0 in | Wt 234.0 lb

## 2018-04-03 DIAGNOSIS — R05 Cough: Secondary | ICD-10-CM | POA: Diagnosis not present

## 2018-04-03 DIAGNOSIS — J9612 Chronic respiratory failure with hypercapnia: Secondary | ICD-10-CM | POA: Diagnosis not present

## 2018-04-03 DIAGNOSIS — J449 Chronic obstructive pulmonary disease, unspecified: Secondary | ICD-10-CM

## 2018-04-03 DIAGNOSIS — J9611 Chronic respiratory failure with hypoxia: Secondary | ICD-10-CM

## 2018-04-03 DIAGNOSIS — R0602 Shortness of breath: Secondary | ICD-10-CM | POA: Diagnosis not present

## 2018-04-03 NOTE — Patient Instructions (Signed)
When really flare, ok to abandon the proair and just using the nebulizer up to every 4 hours if needed but then as soon as better switch back to just the proair   Use flutter valve and purse lip as much as you can    Please remember to go to the  x-ray department downstairs in the basement  for your tests - we will call you with the results when they are available.      Please schedule a follow up office visit in 4 weeks - [facility]  sooner if needed  with all medications /inhalers/ solutions in hand so we can verify exactly what you are taking. This includes all medications from all doctors and over the counters

## 2018-04-03 NOTE — Progress Notes (Signed)
Subjective:    Patient ID: [name], male   DOB: [birth year],  MRN: [id number]      Brief patient profile:  67  yo quit smoking  2012   with GOLD 2 COPD/ morbid obesity > chronic resp failure  With hypercarbia/hypoxemia     09/01/2016  Acute extended ov/[name] re: onset of flare cough /sob  early to [facility] august 2017  Chief Complaint  Patient presents with  . Acute Visit    Pt c/o increase in SOB x 4 days. C/o prod cough with clear to white mucus and chest tightness. Pt states his O2 has been in the 80's. Pt denies f/c/s.   did improve transiently p last ov but never back to baseline then worse x 4 days with severe hacking cough/ min productive all white mucus and increase saba over baseline. Not using flutter valve or mucinex dm as instructed bu taking norc qid for back pain    >>rx Gabapentin for cough , pred taper , CT sinus >neg     09/20/2016 NP Follow up : GOLD II COPD /O2 3 L, AR w/ positive Rast /High IgE. ., UACS   Patient returns for a two-week follow-up for recent COPD flare. He was set up for a CT sinus that showed no evidence of acute sinusitis. He was given a prednisone taper and started Gabapentin 100mg  Three times a day   He is starting to feel some better. Cough is decreased some.  We reviewed all his med and organized them into a med calendar with pt education .  Talked about cost of inhaler . We discussed starting patient assistance papers.  rec Follow med calendar closely and bring to each visit.  Continue on current regimen   09/21/16 Kozlow eval > not tested as on antihistamines> consider low dose maint pred     04/12/2017  f/u ov/[name] re:  Copd II/ AB with MO and 02 dep 3lpm 24/7 maint rx symb/spirva with very good hfa/smi Chief Complaint  Patient presents with  . Follow-up    Pt feels like he is having increase sob with exertion, feels like he is getting sob faster, coughing but unable to produce anything, has some chest congestion, Last dose of prednisone was  friday  prednisone worked after only a few doses but finished last dose 04/08/17 and did not follow action plan re restart prn increase sob or need for saba rec Doxycycline 100 mg twice daily x 10 days Elevate the leg as much as you can above the level of your heart Follow instructions re use of prednisone on your med calendar  Take extra furosemide if leg swelling worse than usual       10/31/17 NP Continue on Symbicort and Spiriva . Rinse after use.  Taper prednisone as directed to 20mg  alternating with 10mg  daily .  Zpack to have on hold if symptoms worsen with discolored mucus .  Continue on Oxygen 4l/m .    12/23/17 Phone care for flare of purulent cough > rec zpak    01/06/2018  f/u ov/[name] re:   02 3-4lpm  But only gold II criteria with hypercarbia c/w ohs component  Chief Complaint  Patient presents with  . Follow-up    f/u COPD, feels well after the abx, still coughing up yellow mucus,    doe = MMRC3 = can't walk 100 yards even at a slow pace at a flat grade s stopping due to sob  Even on 4lpm with adeq sats  documented/ zmax helped a lot at last flare / much less saba need and sputum only slightly yellow using flutter  rec For nasty mucus >  Z pak refillable/ add to action plan Make sure if you turn down your 02 you are still at or above 90% at all times    04/03/2018  f/u ov/[name] re:  Copd II/ AB with ohs and baseline hypercarbia/ can't tol mask of any kind  Chief Complaint  Patient presents with  . Follow-up    He states started having increased congestion, cough and wheezing- started on zpack and will finish this tomorrow. He is coughing up thin mucus "light water"- unsure of color. He is using his albuterol inhaler and neb 2 x daily on average.    Dyspnea:  Worse since 03/28/18  Now across the room  Cough: watery mucus esp in am  Sleep: in recliner 30-45 degrees  SABA use:  Baseline twice daily proair / nebulizer around 5 pm but now that sick 3 x and neb x 2-3 per  day   No obvious patterns in day to day or daytime variability or assoc  purulent sputum or mucus plugs or hemoptysis or cp or chest tightness,   or overt sinus or hb symptoms. No unusual exposure hx or h/o childhood pna/ asthma or knowledge of premature birth.  Sleeping  In recliner at > 30 degrees  With   need for noct saba.   Denies any obvious fluctuation of symptoms with weather or environmental changes or other aggravating or alleviating factors except as outlined above   Current Allergies, Complete Past Medical History, Past Surgical History, Family History, and Social History were reviewed in [employer] record.  ROS  The following are not active complaints unless bolded Hoarseness, sore throat, dysphagia, dental problems, itching, sneezing,  nasal congestion or discharge of excess mucus or purulent secretions, ear ache,   fever, chills, sweats, unintended wt loss or wt gain, classically pleuritic or exertional cp,  orthopnea pnd or arm/hand swelling  or leg swelling, presyncope, palpitations, abdominal pain, anorexia, nausea, vomiting, diarrhea  or change in bowel habits or change in bladder habits, change in stools or change in urine, dysuria, hematuria,  rash, arthralgias, visual complaints, headache, numbness, weakness or ataxia or problems with walking or coordination,  change in mood or  memory.        Current Meds  Medication Sig  . albuterol (PROAIR HFA) 108 (90 Base) MCG/ACT inhaler Inhale 1-2 puffs into the lungs every 4 (four) hours as needed for wheezing or shortness of breath.  Marland Kitchen albuterol (PROVENTIL) (2.5 MG/3ML) 0.083% nebulizer solution Take 3 mLs (2.5 mg total) every 6 (six) hours as needed by nebulization for wheezing or shortness of breath.  Marland Kitchen atorvastatin (LIPITOR) 10 MG tablet TAKE 0.5 TABLETS (5 MG TOTAL) BY MOUTH DAILY.  Marland Kitchen azithromycin (ZITHROMAX) 250 MG tablet Take 2 today, then 1 daily until gone.  . budesonide-formoterol (SYMBICORT)  160-4.5 MCG/ACT inhaler INHALE 2 PUFFS INTO LUNGS TWICE DAILY  . cetirizine (ZYRTEC) 10 MG tablet Take 10 mg by mouth daily as needed (drainage).   . clotrimazole (LOTRIMIN) 1 % cream Apply 1 application topically 2 (two) times daily as needed (rash).   . clotrimazole-betamethasone (LOTRISONE) cream APPLY TWICE A DAY FOR 10 TO 14 DAYS AS DIRECTED  . diclofenac (VOLTAREN) 75 MG EC tablet TAKE 1 TABLET (75 MG TOTAL) BY MOUTH 2 (TWO) TIMES DAILY.  . DULoxetine (CYMBALTA) 60 MG capsule Take 1 capsule  by mouth daily before breakfast.   . fluticasone (FLONASE) 50 MCG/ACT nasal spray INHALE 2 SPRAYS INTO EACH NOSTIL EVERY DAY  . furosemide (LASIX) 40 MG tablet 3 tabs po qam - 2 tabs po pm  . gabapentin (NEURONTIN) 300 MG capsule Take 1 capsule (300 mg total) by mouth 3 (three) times daily.  Marland Kitchen. HYDROcodone-acetaminophen (NORCO) 10-325 MG tablet Take 1 tablet by mouth every 4 (four) hours as needed (cough not responding to mucinex dm). (Patient taking differently: Take 1 tablet by mouth every 6 (six) hours as needed (for pain). )  . KLOR-CON M20 20 MEQ tablet TAKE ONE TABLET BY MOUTH DAILY  . levothyroxine (SYNTHROID, LEVOTHROID) 200 MCG tablet TAKE 1 TABLET BY MOUTH EVERY DAY  . LORazepam (ATIVAN) 0.5 MG tablet TAKE 1 TABLET BY MOUTH EVERY 8 HOURS AS NEEDED  . losartan (COZAAR) 100 MG tablet Take 1 tablet (100 mg total) by mouth daily.  . Misc. Devices (ACAPELLA) MISC Dx 496  . montelukast (SINGULAIR) 10 MG tablet TAKE 1 TABLET (10 MG TOTAL) BY MOUTH AT BEDTIME.  Marland Kitchen. nystatin (MYCOSTATIN) 100000 UNIT/ML suspension TAKE 5 MLS BY MOUTH 4 TIMES A DAY.  Marland Kitchen. OXYGEN Inhale 4 L into the lungs continuous.   . pantoprazole (PROTONIX) 40 MG tablet Take 1 tablet (40 mg total) by mouth daily.  . predniSONE (DELTASONE) 10 MG tablet TAKE 2 TABLETS UNTIL BETTER,THEN 1 TAB DAILY-2 DAYS,THEN 1/2 TAB DAILY-2 DAYS,THEN STOP  . predniSONE (DELTASONE) 20 MG tablet Take 2 tablets (40mg ) once a day for 5 days, then reduce to 1  tablet (20mg ) a day thereafter until otherwise instructed by Dr. Sherene SiresWert  . ranitidine (ZANTAC) 150 MG tablet Take 150 mg by mouth at bedtime.    . sucralfate (CARAFATE) 1 g tablet Take 1 tablet (1 g total) by mouth 4 (four) times daily.  . tamsulosin (FLOMAX) 0.4 MG CAPS capsule TAKE 1 CAPSULE (0.4 MG TOTAL) BY MOUTH DAILY.  Marland Kitchen. Tiotropium Bromide Monohydrate (SPIRIVA RESPIMAT) 2.5 MCG/ACT AERS Inhale 2 puffs daily into the lungs.  Marland Kitchen. tiZANidine (ZANAFLEX) 4 MG capsule Take 4 mg by mouth 2 (two) times daily.                    Objective:   Physical Exam  Obese wm nad    04/03/2018       234  01/06/2018       238  09/09/2017       246  07/04/2017       253 04/12/2017         257  02/28/2017       256  12/27/2016       250  11/15/2016        250  10/15/16 248 lb 3.2 oz (112.6 kg)  09/21/16 251 lb 3.2 oz (113.9 kg)  09/20/16 248 lb (112.5 kg)      Vital signs reviewed - Note on arrival 02 sats  89% on 4lpm continuous     HEENT: nl  oropharynx. Nl external ear canals without cough reflex - moderate bilateral non-specific turbinate edema / poor lower dentition with top dentures in place     NECK :  without JVD/Nodes/TM/ nl carotid upstrokes bilaterally   LUNGS: no acc muscle use,  Mod barrel  contour chest wall with bilateral  Distant bs with wheezes improve p plb and  without cough on insp or exp maneuver        CV:  RRR  no s3  or murmur or increase in P2, and no edema   ABD:  tensley  obese   nontender with pos mid insp Hoover's  in the supine position. No bruits or organomegaly appreciated, bowel sounds nl  MS:   Nl gait/  ext warm without deformities, calf tenderness, cyanosis or clubbing No obvious joint restrictions   SKIN: warm and dry without lesions    NEURO:  alert, approp, nl sensorium with  no motor or cerebellar deficits apparent.            CXR PA and Lateral:   04/03/2018 :    I personally reviewed images and agree with radiology impression as follows:   1.  Changes of emphysema as well as prior granulomatous disease. 2. No pneumonia or effusion is seen. Probable linear scarring at the left lung base.

## 2018-04-04 ENCOUNTER — Encounter: Payer: Self-pay | Admitting: Internal Medicine

## 2018-04-05 ENCOUNTER — Encounter: Payer: Self-pay | Admitting: Internal Medicine

## 2018-04-05 NOTE — Assessment & Plan Note (Signed)
HCO3  08/13/17  38  C/w hypercarbia   Despite flare >  Adequate control on present rx, reviewed in detail with pt > no change in rx needed

## 2018-04-05 NOTE — Assessment & Plan Note (Signed)
Body mass index is 41.45 kg/m.  -  trending down slightly/ encouraged  Lab Results  Component Value Date   TSH 1.72 11/01/2016     Contributing to gerd risk/ doe/reviewed the need and the process to achieve and maintain neg calorie balance > defer f/u primary care including intermittently monitoring thyroid status

## 2018-04-05 NOTE — Assessment & Plan Note (Signed)
-   added prednisone 20 max and taper off for flares as part of action plan 10/15/2016   - 07/04/2017 changed to pred 20 ceiling and 10 mg floor  - 04/03/2018  After extensive coaching inhaler device  effectiveness =    75% from a baseline of 25% (short Ti)   Unfortunately having a flare which so far has not responded to the action plan which included a trial of zpak and increase pred to 20 mg per day  The only new addition I rec for now is to use more flutter/ plm to eliminate pseudowheeze/ improve cough mechanics and prevent cyclical coughing from the trauma of the coughing itself and the likely gerd that is occurying based on observation and reported "watery mucus"    I had an extended discussion with the patient and daughter reviewing all relevant studies completed to date and  lasting 15 to 20 minutes of a 25 minute visit    Each maintenance medication was reviewed in detail including most importantly the difference between maintenance and prns and under what circumstances the prns are to be triggered using an action plan format that is not reflected in the computer generated alphabetically organized AVS but trather by a customized med calendar that reflects the AVS meds with confirmed 100% correlation.   In addition, Please see AVS for unique instructions that I personally wrote and verbalized to the the pt in detail and then reviewed with pt  by my nurse highlighting any  changes in therapy recommended at today's visit to their plan of care.

## 2018-04-07 NOTE — Telephone Encounter (Signed)
Pt was seen by MW on 4.22.19  PA for ProAir not discussed per office visit  LMOM TCB x2  Called [employer], spoke with Rodney Boozeasha who reported that patient cash paid for a generic albuterol HFA at [employer]112.15 Called [employer] to initiate a PA for the ProAir - this was initiated over the phone Decision will be made within 72 hours and faxed to triage Ref # [id number] Call back # [phone]

## 2018-04-08 DIAGNOSIS — I2783 Eisenmenger's syndrome: Secondary | ICD-10-CM | POA: Diagnosis not present

## 2018-04-08 DIAGNOSIS — J449 Chronic obstructive pulmonary disease, unspecified: Secondary | ICD-10-CM | POA: Diagnosis not present

## 2018-04-08 DIAGNOSIS — G479 Sleep disorder, unspecified: Secondary | ICD-10-CM | POA: Diagnosis not present

## 2018-04-08 DIAGNOSIS — R0902 Hypoxemia: Secondary | ICD-10-CM | POA: Diagnosis not present

## 2018-04-10 NOTE — Telephone Encounter (Signed)
Called to check status of PA, coverage has been denied.  Denial is being faxed to our office.  Will await denial form to see how to appeal this decision.

## 2018-04-11 ENCOUNTER — Other Ambulatory Visit: Payer: Self-pay | Admitting: Family Medicine

## 2018-04-11 ENCOUNTER — Other Ambulatory Visit: Payer: Self-pay | Admitting: Adult Health

## 2018-04-11 DIAGNOSIS — I5032 Chronic diastolic (congestive) heart failure: Secondary | ICD-10-CM | POA: Diagnosis not present

## 2018-04-11 DIAGNOSIS — R0902 Hypoxemia: Secondary | ICD-10-CM | POA: Diagnosis not present

## 2018-04-11 DIAGNOSIS — J189 Pneumonia, unspecified organism: Secondary | ICD-10-CM | POA: Diagnosis not present

## 2018-04-11 DIAGNOSIS — G479 Sleep disorder, unspecified: Secondary | ICD-10-CM | POA: Diagnosis not present

## 2018-04-11 DIAGNOSIS — J449 Chronic obstructive pulmonary disease, unspecified: Secondary | ICD-10-CM | POA: Diagnosis not present

## 2018-04-11 DIAGNOSIS — I2783 Eisenmenger's syndrome: Secondary | ICD-10-CM | POA: Diagnosis not present

## 2018-04-12 NOTE — Telephone Encounter (Signed)
Per MW- call in ventolin or proventil since they are covered The PA for proair was denied  LMTCB before sending rx

## 2018-04-12 NOTE — Telephone Encounter (Signed)
Pt called back, aware that insurance will not cover proair.  Pt states that he just picked up a 3 month supply of his medication so he does not need a new rx at this time.  Will close encounter.

## 2018-04-12 NOTE — Telephone Encounter (Signed)
Still awaiting fax

## 2018-04-17 ENCOUNTER — Other Ambulatory Visit: Payer: Self-pay | Admitting: Family Medicine

## 2018-04-18 ENCOUNTER — Other Ambulatory Visit: Payer: Self-pay | Admitting: Internal Medicine

## 2018-04-20 DIAGNOSIS — G894 Chronic pain syndrome: Secondary | ICD-10-CM | POA: Diagnosis not present

## 2018-04-20 DIAGNOSIS — Z79899 Other long term (current) drug therapy: Secondary | ICD-10-CM | POA: Diagnosis not present

## 2018-04-21 DIAGNOSIS — I2783 Eisenmenger's syndrome: Secondary | ICD-10-CM | POA: Diagnosis not present

## 2018-04-21 DIAGNOSIS — J189 Pneumonia, unspecified organism: Secondary | ICD-10-CM | POA: Diagnosis not present

## 2018-04-21 DIAGNOSIS — B370 Candidal stomatitis: Secondary | ICD-10-CM | POA: Diagnosis not present

## 2018-04-21 DIAGNOSIS — J441 Chronic obstructive pulmonary disease with (acute) exacerbation: Secondary | ICD-10-CM | POA: Diagnosis not present

## 2018-04-21 DIAGNOSIS — I5032 Chronic diastolic (congestive) heart failure: Secondary | ICD-10-CM | POA: Diagnosis not present

## 2018-04-21 DIAGNOSIS — G479 Sleep disorder, unspecified: Secondary | ICD-10-CM | POA: Diagnosis not present

## 2018-04-21 DIAGNOSIS — J449 Chronic obstructive pulmonary disease, unspecified: Secondary | ICD-10-CM | POA: Diagnosis not present

## 2018-04-21 DIAGNOSIS — R0902 Hypoxemia: Secondary | ICD-10-CM | POA: Diagnosis not present

## 2018-05-01 ENCOUNTER — Ambulatory Visit (INDEPENDENT_AMBULATORY_CARE_PROVIDER_SITE_OTHER): Payer: Medicare HMO | Admitting: Adult Health

## 2018-05-01 ENCOUNTER — Encounter: Payer: Self-pay | Admitting: Adult Health

## 2018-05-01 DIAGNOSIS — J9611 Chronic respiratory failure with hypoxia: Secondary | ICD-10-CM

## 2018-05-01 DIAGNOSIS — J449 Chronic obstructive pulmonary disease, unspecified: Secondary | ICD-10-CM

## 2018-05-01 DIAGNOSIS — J9612 Chronic respiratory failure with hypercapnia: Secondary | ICD-10-CM

## 2018-05-01 DIAGNOSIS — I5032 Chronic diastolic (congestive) heart failure: Secondary | ICD-10-CM

## 2018-05-01 NOTE — Assessment & Plan Note (Addendum)
Stable on current regimen Try to discuss with patient going down on prednisone however he says he does not want to decrease the dose as he is currently doing well Patient's medications were reviewed today and patient education was given. Computerized medication calendar was adjusted/completed    Plan  Patient Instructions  Continue on Symbicort and Spiriva . Rinse after use.  Continue on Oxygen 4l/m .  Follow med calendar closely and bring to each visit.  Follow up with Dr. Sherene Sires  In 2 months and As needed   Please contact office for sooner follow up if symptoms do not improve or worsen or seek emergency care

## 2018-05-01 NOTE — Assessment & Plan Note (Signed)
Cont on O2 .  

## 2018-05-01 NOTE — Assessment & Plan Note (Signed)
Appears stable , cont on current regimen

## 2018-05-01 NOTE — Patient Instructions (Signed)
Continue on Symbicort and Spiriva . Rinse after use.  Continue on Oxygen 4l/m .  Follow med calendar closely and bring to each visit.  Follow up with Dr. Sherene Sires  In 2 months and As needed   Please contact office for sooner follow up if symptoms do not improve or worsen or seek emergency care
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
## 2018-05-02 NOTE — Progress Notes (Signed)
Chart and office note reviewed in detail  > agree with a/p as outlined    

## 2018-05-08 DIAGNOSIS — J449 Chronic obstructive pulmonary disease, unspecified: Secondary | ICD-10-CM | POA: Diagnosis not present

## 2018-05-08 DIAGNOSIS — R0902 Hypoxemia: Secondary | ICD-10-CM | POA: Diagnosis not present

## 2018-05-08 DIAGNOSIS — G479 Sleep disorder, unspecified: Secondary | ICD-10-CM | POA: Diagnosis not present

## 2018-05-08 DIAGNOSIS — I2783 Eisenmenger's syndrome: Secondary | ICD-10-CM | POA: Diagnosis not present

## 2018-05-17 NOTE — Addendum Note (Signed)
Addended by: Etheleen MayhewOX, Samatha Anspach C on: 05/17/2018 02:33 PM   Modules accepted: Orders

## 2018-05-22 DIAGNOSIS — J189 Pneumonia, unspecified organism: Secondary | ICD-10-CM | POA: Diagnosis not present

## 2018-05-22 DIAGNOSIS — B370 Candidal stomatitis: Secondary | ICD-10-CM | POA: Diagnosis not present

## 2018-05-22 DIAGNOSIS — J441 Chronic obstructive pulmonary disease with (acute) exacerbation: Secondary | ICD-10-CM | POA: Diagnosis not present

## 2018-05-22 DIAGNOSIS — J449 Chronic obstructive pulmonary disease, unspecified: Secondary | ICD-10-CM | POA: Diagnosis not present

## 2018-05-22 DIAGNOSIS — I2783 Eisenmenger's syndrome: Secondary | ICD-10-CM | POA: Diagnosis not present

## 2018-05-22 DIAGNOSIS — I5032 Chronic diastolic (congestive) heart failure: Secondary | ICD-10-CM | POA: Diagnosis not present

## 2018-05-22 DIAGNOSIS — R0902 Hypoxemia: Secondary | ICD-10-CM | POA: Diagnosis not present

## 2018-05-22 DIAGNOSIS — G479 Sleep disorder, unspecified: Secondary | ICD-10-CM | POA: Diagnosis not present

## 2018-06-08 DIAGNOSIS — J449 Chronic obstructive pulmonary disease, unspecified: Secondary | ICD-10-CM | POA: Diagnosis not present

## 2018-06-08 DIAGNOSIS — R0902 Hypoxemia: Secondary | ICD-10-CM | POA: Diagnosis not present

## 2018-06-08 DIAGNOSIS — I2783 Eisenmenger's syndrome: Secondary | ICD-10-CM | POA: Diagnosis not present

## 2018-06-08 DIAGNOSIS — G479 Sleep disorder, unspecified: Secondary | ICD-10-CM | POA: Diagnosis not present

## 2018-06-14 ENCOUNTER — Other Ambulatory Visit: Payer: Self-pay | Admitting: Family Medicine

## 2018-06-14 MED ORDER — CLOTRIMAZOLE-BETAMETHASONE 1-0.05 % EX CREA: TOPICAL_CREAM | CUTANEOUS | 1 refills | Status: DC

## 2018-06-19 ENCOUNTER — Telehealth: Payer: Self-pay | Admitting: Internal Medicine

## 2018-06-19 MED ORDER — TIOTROPIUM BROMIDE MONOHYDRATE 2.5 MCG/ACT IN AERS: 2.0000 | INHALATION_SPRAY | Freq: Every day | RESPIRATORY_TRACT | 3 refills | Status: DC

## 2018-06-19 MED ORDER — BUDESONIDE-FORMOTEROL FUMARATE 160-4.5 MCG/ACT IN AERO: INHALATION_SPRAY | RESPIRATORY_TRACT | 3 refills | Status: DC

## 2018-06-19 MED ORDER — ALBUTEROL SULFATE HFA 108 (90 BASE) MCG/ACT IN AERS: 2.0000 | INHALATION_SPRAY | Freq: Four times a day (QID) | RESPIRATORY_TRACT | 3 refills | Status: DC | PRN

## 2018-06-19 NOTE — Telephone Encounter (Signed)
Called pt to verify the pharmacies that all meds needed to be sent to and pt verified that the information stated was correct.  Sent refills of meds to pt's preferred pharmacies. Nothing further needed.

## 2018-06-21 DIAGNOSIS — I2783 Eisenmenger's syndrome: Secondary | ICD-10-CM | POA: Diagnosis not present

## 2018-06-21 DIAGNOSIS — J441 Chronic obstructive pulmonary disease with (acute) exacerbation: Secondary | ICD-10-CM | POA: Diagnosis not present

## 2018-06-21 DIAGNOSIS — I5032 Chronic diastolic (congestive) heart failure: Secondary | ICD-10-CM | POA: Diagnosis not present

## 2018-06-21 DIAGNOSIS — J189 Pneumonia, unspecified organism: Secondary | ICD-10-CM | POA: Diagnosis not present

## 2018-06-21 DIAGNOSIS — J449 Chronic obstructive pulmonary disease, unspecified: Secondary | ICD-10-CM | POA: Diagnosis not present

## 2018-06-21 DIAGNOSIS — G479 Sleep disorder, unspecified: Secondary | ICD-10-CM | POA: Diagnosis not present

## 2018-06-21 DIAGNOSIS — R0902 Hypoxemia: Secondary | ICD-10-CM | POA: Diagnosis not present

## 2018-06-21 DIAGNOSIS — B370 Candidal stomatitis: Secondary | ICD-10-CM | POA: Diagnosis not present

## 2018-07-03 ENCOUNTER — Encounter: Payer: Self-pay | Admitting: Internal Medicine

## 2018-07-03 ENCOUNTER — Ambulatory Visit: Payer: Medicare HMO | Admitting: Internal Medicine

## 2018-07-03 VITALS — BP 120/70 | HR 112 | Ht 64.0 in | Wt 226.0 lb

## 2018-07-03 DIAGNOSIS — R058 Other specified cough: Secondary | ICD-10-CM

## 2018-07-03 DIAGNOSIS — R05 Cough: Secondary | ICD-10-CM | POA: Diagnosis not present

## 2018-07-03 DIAGNOSIS — J449 Chronic obstructive pulmonary disease, unspecified: Secondary | ICD-10-CM

## 2018-07-03 DIAGNOSIS — J9612 Chronic respiratory failure with hypercapnia: Secondary | ICD-10-CM | POA: Diagnosis not present

## 2018-07-03 DIAGNOSIS — J9611 Chronic respiratory failure with hypoxia: Secondary | ICD-10-CM

## 2018-07-03 MED ORDER — MONTELUKAST SODIUM 10 MG PO TABS: ORAL_TABLET | ORAL | 1 refills | Status: AC

## 2018-07-03 NOTE — Progress Notes (Signed)
Subjective:    Patient ID: [name], male   DOB: [birth year],  MRN: [id number]      Brief patient profile:  67  yo quit smoking  2012   with GOLD 2 COPD/ morbid obesity > chronic resp failure  With hypercarbia/hypoxemia     09/01/2016  Acute extended ov/[name] re: onset of flare cough /sob  early to [facility] august 2017  Chief Complaint  Patient presents with  . Acute Visit    Pt c/o increase in SOB x 4 days. C/o prod cough with clear to white mucus and chest tightness. Pt states his O2 has been in the 80's. Pt denies f/c/s.   did improve transiently p last ov but never back to baseline then worse x 4 days with severe hacking cough/ min productive all white mucus and increase saba over baseline. Not using flutter valve or mucinex dm as instructed bu taking norc qid for back pain    >>rx Gabapentin for cough , pred taper , CT sinus >neg     09/20/2016 NP Follow up : GOLD II COPD /O2 3 L, AR w/ positive Rast /High IgE. ., UACS   Patient returns for a two-week follow-up for recent COPD flare. He was set up for a CT sinus that showed no evidence of acute sinusitis. He was given a prednisone taper and started Gabapentin 100mg  Three times a day   He is starting to feel some [name]. Cough is decreased some.  We reviewed all his med and organized them into a med calendar with pt education .  Talked about cost of inhaler . We discussed starting patient assistance papers.  rec Follow med calendar closely and bring to each visit.  Continue on current regimen   09/21/16 Kozlow eval > not tested as on antihistamines> consider low dose maint pred     04/12/2017  f/u ov/[name] re:  Copd II/ AB with MO and 02 dep 3lpm 24/7 maint rx symb/spirva with very good hfa/smi Chief Complaint  Patient presents with  . Follow-up    Pt feels like he is having increase sob with exertion, feels like he is getting sob faster, coughing but unable to produce anything, has some chest congestion, Last dose of prednisone was  friday  prednisone worked after only a few doses but finished last dose 04/08/17 and did not follow action plan re restart prn increase sob or need for saba rec Doxycycline 100 mg twice daily x 10 days Elevate the leg as much as you can above the level of your heart Follow instructions re use of prednisone on your med calendar  Take extra furosemide if leg swelling worse than usual       10/31/17 NP Continue on Symbicort and Spiriva . Rinse after use.  Taper prednisone as directed to 20mg  alternating with 10mg  daily .  Zpack to have on hold if symptoms worsen with discolored mucus .  Continue on Oxygen 4l/m .    12/23/17 Phone care for flare of purulent cough > rec zpak    01/06/2018  f/u ov/[name] re:   02 3-4lpm  But only gold II criteria with hypercarbia c/w ohs component  Chief Complaint  Patient presents with  . Follow-up    f/u COPD, feels well after the abx, still coughing up yellow mucus,    doe = MMRC3 = can't walk 100 yards even at a slow pace at a flat grade s stopping due to sob  Even on 4lpm with adeq sats  documented/ zmax helped a lot at last flare / much less saba need and sputum only slightly yellow using flutter  rec For nasty mucus >  Z pak refillable/ add to action plan Make sure if you turn down your 02 you are still at or above 90% at all times    04/03/2018  f/u ov/[name] re:  Copd II/ AB with ohs and baseline hypercarbia/ can't tol mask of any kind  Chief Complaint  Patient presents with  . Follow-up    He states started having increased congestion, cough and wheezing- started on zpack and will finish this tomorrow. He is coughing up thin mucus "light water"- unsure of color. He is using his albuterol inhaler and neb 2 x daily on average.    Dyspnea:  Worse since 03/28/18  Now across the room  Cough: watery mucus esp in am  Sleep: in recliner 30-45 degrees  SABA use:  Baseline twice daily proair / nebulizer around 5 pm but now that sick 3 x and neb x 2-3 per  day rec When really flare, ok to abandon the proair and just using the nebulizer up to every 4 hours if needed but then as soon as [name] switch back to just the proair  Use flutter valve and purse lip as much as you can    07/03/2018  f/u ov/[name] re: copd/ab steroid dep/ 02 dep   Now at 20 mg daily  Chief Complaint  Patient presents with  . Follow-up    Breathing is "not too bad"- worse with hot weather. He is using his albuterol inhaler and neb with albuterol both 2 x daily on average.    Dyspnea:  MMRC3 = can't walk 100 yards even at a slow pace at a flat grade s stopping due to sob  Even on 02 which he's not adjusting to keep > 90% as prev instructed  Cough: min dry  SABA use: as above, way too much neb with good hfa demonstrated today 02: 4lpm increase to 5 lpm walking    No obvious day to day or daytime variability or assoc excess/ purulent sputum or mucus plugs or hemoptysis or cp or chest tightness, subjective wheeze or overt sinus or hb symptoms.   Sleeping: [name] on 2 pillows/ 5lpm without nocturnal  or early am exacerbation  of respiratory  c/o's or need for noct saba. Also denies any obvious fluctuation of symptoms with weather or environmental changes or other aggravating or alleviating factors except as outlined above   No unusual exposure hx or h/o childhood pna/ asthma or knowledge of premature birth.  Current Allergies, Complete Past Medical History, Past Surgical History, Family History, and Social History were reviewed in [employer] record.  ROS  The following are not active complaints unless bolded Hoarseness, sore throat, dysphagia, dental problems, itching, sneezing,  nasal congestion or discharge of excess mucus or purulent secretions, ear ache,   fever, chills, sweats, unintended wt loss or wt gain, classically pleuritic or exertional cp,  orthopnea pnd or arm/hand swelling  or leg swelling, presyncope, palpitations, abdominal pain, anorexia,  nausea, vomiting, diarrhea  or change in bowel habits or change in bladder habits, change in stools or change in urine, dysuria, hematuria,  rash, arthralgias, visual complaints, headache, numbness, weakness or ataxia or problems with walking or coordination,  change in mood or  memory.        Current Meds  Medication Sig  . acetaminophen (TYLENOL) 325 MG tablet Take 650  mg by mouth every 6 (six) hours as needed.  Marland Kitchen. albuterol (PROVENTIL HFA;VENTOLIN HFA) 108 (90 Base) MCG/ACT inhaler Inhale 2 puffs into the lungs every 6 (six) hours as needed for wheezing or shortness of breath.  Marland Kitchen. albuterol (PROVENTIL) (2.5 MG/3ML) 0.083% nebulizer solution Take 3 mLs (2.5 mg total) every 6 (six) hours as needed by nebulization for wheezing or shortness of breath.  Marland Kitchen. atorvastatin (LIPITOR) 10 MG tablet TAKE 0.5 TABLETS (5 MG TOTAL) BY MOUTH DAILY.  . budesonide-formoterol (SYMBICORT) 160-4.5 MCG/ACT inhaler INHALE 2 PUFFS INTO LUNGS TWICE DAILY  . cetirizine (ZYRTEC) 10 MG tablet Take 10 mg by mouth daily as needed (drainage).   . clotrimazole-betamethasone (LOTRISONE) cream APPLY TWICE A DAY FOR 10 TO 14 DAYS AS DIRECTED  . dextromethorphan-guaiFENesin (MUCINEX DM) 30-600 MG 12hr tablet Take 1 tablet by mouth 2 (two) times daily.  . diclofenac (VOLTAREN) 75 MG EC tablet TAKE 1 TABLET (75 MG TOTAL) BY MOUTH 2 (TWO) TIMES DAILY.  . DULoxetine (CYMBALTA) 60 MG capsule Take 1 capsule by mouth daily before breakfast.   . fluticasone (FLONASE) 50 MCG/ACT nasal spray INHALE 2 SPRAYS INTO EACH NOSTIL EVERY DAY  . furosemide (LASIX) 40 MG tablet 3 tabs po qam - 2 tabs po pm  . gabapentin (NEURONTIN) 300 MG capsule TAKE 1 CAPSULE BY MOUTH THREE TIMES A DAY  . HYDROcodone-acetaminophen (NORCO) 10-325 MG tablet Take 1 tablet by mouth every 4 (four) hours as needed (cough not responding to mucinex dm). (Patient taking differently: Take 1 tablet by mouth every 6 (six) hours as needed (for pain). )  . KLOR-CON M20 20 MEQ  tablet TAKE ONE TABLET BY MOUTH DAILY  . levothyroxine (SYNTHROID, LEVOTHROID) 200 MCG tablet TAKE 1 TABLET BY MOUTH EVERY DAY  . LORazepam (ATIVAN) 0.5 MG tablet TAKE 1 TABLET BY MOUTH EVERY 8 HOURS AS NEEDED  . losartan (COZAAR) 100 MG tablet Take 1 tablet (100 mg total) by mouth daily.  . montelukast (SINGULAIR) 10 MG tablet TAKE 1 TABLET (10 MG TOTAL) BY MOUTH AT BEDTIME.  Marland Kitchen. OXYGEN Inhale 4 L into the lungs continuous.   . pantoprazole (PROTONIX) 40 MG tablet Take 1 tablet (40 mg total) by mouth daily.  . predniSONE (DELTASONE) 10 MG tablet TAKE 2 TABLETS UNTIL [name],THEN 1 TAB DAILY-2 DAYS,THEN 1/2 TAB DAILY-2 DAYS,THEN STOP (Patient taking differently: Take two tablets by mouth daily.)  . ranitidine (ZANTAC) 150 MG tablet Take 150 mg by mouth at bedtime.    . sucralfate (CARAFATE) 1 g tablet Take 1 tablet (1 g total) by mouth 4 (four) times daily.  . tamsulosin (FLOMAX) 0.4 MG CAPS capsule TAKE 1 CAPSULE (0.4 MG TOTAL) BY MOUTH DAILY.  Marland Kitchen. Tiotropium Bromide Monohydrate (SPIRIVA RESPIMAT) 2.5 MCG/ACT AERS Inhale 2 puffs into the lungs daily.  Marland Kitchen. tiZANidine (ZANAFLEX) 4 MG capsule Take 4 mg by mouth 2 (two) times daily.                Objective:   Physical Exam  Obese wm nad     07/03/2018         226  04/03/2018       234  01/06/2018       238  09/09/2017       246  07/04/2017       253 04/12/2017         257  02/28/2017       256  12/27/2016       250  11/15/2016  250  10/15/16 248 lb 3.2 oz (112.6 kg)  09/21/16 251 lb 3.2 oz (113.9 kg)  09/20/16 248 lb (112.5 kg)     Vital signs reviewed - Note on arrival 02 sats  91% on 4lpm continuous     HEENT: nl   turbinates bilaterally, and oropharynx. Nl external ear canals without cough reflex/ upper dentures/ poor lower dentition.   NECK :  without JVD/Nodes/TM/ nl carotid upstrokes bilaterally   LUNGS: no acc muscle use,  Nl contour chest with distant bs  bilaterally without cough on insp or exp maneuvers   CV:  RRR   no s3 or murmur or increase in P2, and trace pitting bil lower ext edema   ABD:  Tensely obese / nontender with  Limited inspiratory excursion in the supine position. No bruits or organomegaly appreciated, bowel sounds nl  MS:  Nl gait/ ext warm without deformities, calf tenderness, cyanosis or clubbing No obvious joint restrictions   SKIN: warm and dry without lesions    NEURO:  alert, approp, nl sensorium with  no motor or cerebellar deficits apparent.

## 2018-07-03 NOTE — Patient Instructions (Addendum)
Goal is to keep in low 90's  - home concentrator to 5 lpm walking room and when you walk outside to 6 lpm then back to 4lpm   Try to reduce prednisone to 10 mg one and a half each am   Always try your proair first before going to your nebulizer as your technique for using it as improved enough to where the inhaler should work better/ quicker as rescue when you can't catch your breath.   See calendar for specific medication instructions and bring it back for each and every office visit for every healthcare provider you see.  Without it,  you may not receive the best quality medical care that we feel you deserve.  You will note that the calendar groups together  your maintenance  medications that are timed at particular times of the day.  Think of this as your checklist for what your doctor has instructed you to do until your next evaluation to see what benefit  there is  to staying on a consistent group of medications intended to keep you well.  The other group at the bottom is entirely up to you to use as you see fit  for specific symptoms that may arise between visits that require you to treat them on an as needed basis.  Think of this as your action plan or "what if" list.   Separating the top medications from the bottom group is fundamental to providing you adequate care going forward.     Please schedule a follow up visit in 3 months but call sooner if needed to see Tammy NP next then me the time after

## 2018-07-04 ENCOUNTER — Encounter: Payer: Self-pay | Admitting: Internal Medicine

## 2018-07-04 NOTE — Assessment & Plan Note (Signed)
Body mass index is 38.79 kg/m.  -  trending down/ encouraged  Lab Results  Component Value Date   TSH 1.72 11/01/2016     Contributing to gerd risk/ doe/reviewed the need and the process to achieve and maintain neg calorie balance > defer f/u primary care including intermittently monitoring thyroid status

## 2018-07-04 NOTE — Assessment & Plan Note (Signed)
HCO3  08/13/17  38  C/w hypercarbia   Reviewed goal is to keep sats lower 90s at all times so ok to briefly turn higher during ex

## 2018-07-04 NOTE — Assessment & Plan Note (Signed)
-   Allergy profile 03/11/16  >  Eos 0.2 /  IgE  359 with pos RAST grass / trees  - flutter valve 08/11/16  Trial of neurontin 100 tid 09/01/2016    - Sinus CT 09/03/2016 >>> Visualized paranasal sinuses are clear. No air-fluid levels or mucosal thickening. Orbital soft tissues and visualized bony structures unremarkable. - Kozlow eval 09/21/16 rec consider immunotherapy - improved on gabapentin 300 tid as of ov 07/04/2017 > continue as of 07/03/2018    Adequate control on present rx, reviewed in detail with pt > no change in rx needed

## 2018-07-04 NOTE — Assessment & Plan Note (Signed)
Spirometry 10/15/2016  FEV1 1.41 (52%)  Ratio 61 p am symb/spiriva/saba w/in 4h - added prednisone 20 max and taper off for flares as part of action plan 10/15/2016   - 07/04/2017 changed to pred 20 ceiling and 10 mg floor  - 04/03/2018  After extensive coaching inhaler device  effectiveness =    75% from a baseline of 25% (short Ti)   -  07/03/2018  After extensive coaching inhaler device  effectiveness =    90%  So rec try reduce neb use if possible and pred taper to 15 mg daily    Group D in terms of symptom/risk and laba/lama/ICS  therefore appropriate rx at this point> continue triple rx but taper prednisone if possible. The goal with a chronic steroid dependent illness is always arriving at the lowest effective dose that controls the disease/symptoms and not accepting a set "formula" which is based on statistics or guidelines that don't always take into account patient  variability or the natural hx of the dz in every individual patient, which may well vary over time.  For now therefore I recommend the patient maintain  15 mg daily until next ov then drop to 10 if doing ok    I had an extended discussion with the patient reviewing all relevant studies completed to date and  lasting 15 to 20 minutes of a 25 minute visit    See device teaching which extended face to face time for this visit   Each maintenance medication was reviewed in detail including most importantly the difference between maintenance and prns and under what circumstances the prns are to be triggered using an action plan format that is not reflected in the computer generated alphabetically organized AVS but trather by a customized med calendar that reflects the AVS meds with confirmed 100% correlation.   In addition, Please see AVS for unique instructions that I personally wrote and verbalized to the the pt in detail and then reviewed with pt  by my nurse highlighting any  changes in therapy recommended at today's visit to their  plan of care.

## 2018-07-06 DIAGNOSIS — I2783 Eisenmenger's syndrome: Secondary | ICD-10-CM | POA: Diagnosis not present

## 2018-07-06 DIAGNOSIS — G479 Sleep disorder, unspecified: Secondary | ICD-10-CM | POA: Diagnosis not present

## 2018-07-06 DIAGNOSIS — R0902 Hypoxemia: Secondary | ICD-10-CM | POA: Diagnosis not present

## 2018-07-06 DIAGNOSIS — J449 Chronic obstructive pulmonary disease, unspecified: Secondary | ICD-10-CM | POA: Diagnosis not present

## 2018-07-08 DIAGNOSIS — I2783 Eisenmenger's syndrome: Secondary | ICD-10-CM | POA: Diagnosis not present

## 2018-07-08 DIAGNOSIS — R0902 Hypoxemia: Secondary | ICD-10-CM | POA: Diagnosis not present

## 2018-07-08 DIAGNOSIS — J449 Chronic obstructive pulmonary disease, unspecified: Secondary | ICD-10-CM | POA: Diagnosis not present

## 2018-07-08 DIAGNOSIS — G479 Sleep disorder, unspecified: Secondary | ICD-10-CM | POA: Diagnosis not present

## 2018-07-13 ENCOUNTER — Other Ambulatory Visit: Payer: Self-pay | Admitting: Family Medicine

## 2018-07-17 ENCOUNTER — Other Ambulatory Visit: Payer: Self-pay | Admitting: Internal Medicine

## 2018-07-22 DIAGNOSIS — G479 Sleep disorder, unspecified: Secondary | ICD-10-CM | POA: Diagnosis not present

## 2018-07-22 DIAGNOSIS — R0902 Hypoxemia: Secondary | ICD-10-CM | POA: Diagnosis not present

## 2018-07-22 DIAGNOSIS — I5032 Chronic diastolic (congestive) heart failure: Secondary | ICD-10-CM | POA: Diagnosis not present

## 2018-07-22 DIAGNOSIS — I2783 Eisenmenger's syndrome: Secondary | ICD-10-CM | POA: Diagnosis not present

## 2018-07-22 DIAGNOSIS — J449 Chronic obstructive pulmonary disease, unspecified: Secondary | ICD-10-CM | POA: Diagnosis not present

## 2018-07-22 DIAGNOSIS — J441 Chronic obstructive pulmonary disease with (acute) exacerbation: Secondary | ICD-10-CM | POA: Diagnosis not present

## 2018-07-22 DIAGNOSIS — B370 Candidal stomatitis: Secondary | ICD-10-CM | POA: Diagnosis not present

## 2018-07-22 DIAGNOSIS — J189 Pneumonia, unspecified organism: Secondary | ICD-10-CM | POA: Diagnosis not present

## 2018-07-24 ENCOUNTER — Other Ambulatory Visit: Payer: Self-pay | Admitting: Family Medicine

## 2018-07-30 ENCOUNTER — Other Ambulatory Visit: Payer: Self-pay | Admitting: Internal Medicine

## 2018-08-08 ENCOUNTER — Encounter: Payer: Self-pay | Admitting: Family Medicine

## 2018-08-08 ENCOUNTER — Ambulatory Visit (INDEPENDENT_AMBULATORY_CARE_PROVIDER_SITE_OTHER): Payer: Medicare HMO | Admitting: Family Medicine

## 2018-08-08 VITALS — BP 110/56 | HR 98 | Temp 98.2°F | Resp 26 | Ht 63.0 in | Wt 229.0 lb

## 2018-08-08 DIAGNOSIS — L97829 Non-pressure chronic ulcer of other part of left lower leg with unspecified severity: Secondary | ICD-10-CM

## 2018-08-08 DIAGNOSIS — I2783 Eisenmenger's syndrome: Secondary | ICD-10-CM | POA: Diagnosis not present

## 2018-08-08 DIAGNOSIS — I872 Venous insufficiency (chronic) (peripheral): Secondary | ICD-10-CM | POA: Diagnosis not present

## 2018-08-08 DIAGNOSIS — R0902 Hypoxemia: Secondary | ICD-10-CM | POA: Diagnosis not present

## 2018-08-08 DIAGNOSIS — G479 Sleep disorder, unspecified: Secondary | ICD-10-CM | POA: Diagnosis not present

## 2018-08-08 DIAGNOSIS — J449 Chronic obstructive pulmonary disease, unspecified: Secondary | ICD-10-CM | POA: Diagnosis not present
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
## 2018-08-11 ENCOUNTER — Ambulatory Visit: Payer: Medicare HMO | Admitting: Family Medicine

## 2018-08-17 DIAGNOSIS — Z79899 Other long term (current) drug therapy: Secondary | ICD-10-CM | POA: Diagnosis not present

## 2018-08-17 DIAGNOSIS — M25512 Pain in left shoulder: Secondary | ICD-10-CM | POA: Diagnosis not present

## 2018-08-18 ENCOUNTER — Encounter: Payer: Self-pay | Admitting: Family Medicine

## 2018-08-18 ENCOUNTER — Ambulatory Visit (INDEPENDENT_AMBULATORY_CARE_PROVIDER_SITE_OTHER): Payer: Medicare HMO | Admitting: Family Medicine

## 2018-08-18 VITALS — BP 130/74 | HR 86 | Temp 98.5°F | Resp 16 | Ht 63.0 in | Wt 223.0 lb

## 2018-08-18 DIAGNOSIS — I872 Venous insufficiency (chronic) (peripheral): Secondary | ICD-10-CM | POA: Diagnosis not present

## 2018-08-18 DIAGNOSIS — L97829 Non-pressure chronic ulcer of other part of left lower leg with unspecified severity: Secondary | ICD-10-CM
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
## 2018-08-22 ENCOUNTER — Encounter: Payer: Self-pay | Admitting: Family Medicine

## 2018-08-22 DIAGNOSIS — R0902 Hypoxemia: Secondary | ICD-10-CM | POA: Diagnosis not present

## 2018-08-22 DIAGNOSIS — J449 Chronic obstructive pulmonary disease, unspecified: Secondary | ICD-10-CM | POA: Diagnosis not present

## 2018-08-22 DIAGNOSIS — I5032 Chronic diastolic (congestive) heart failure: Secondary | ICD-10-CM | POA: Diagnosis not present

## 2018-08-22 DIAGNOSIS — I2783 Eisenmenger's syndrome: Secondary | ICD-10-CM | POA: Diagnosis not present

## 2018-08-22 DIAGNOSIS — J189 Pneumonia, unspecified organism: Secondary | ICD-10-CM | POA: Diagnosis not present

## 2018-08-22 DIAGNOSIS — G479 Sleep disorder, unspecified: Secondary | ICD-10-CM | POA: Diagnosis not present

## 2018-08-22 DIAGNOSIS — J441 Chronic obstructive pulmonary disease with (acute) exacerbation: Secondary | ICD-10-CM | POA: Diagnosis not present

## 2018-08-22 DIAGNOSIS — B370 Candidal stomatitis: Secondary | ICD-10-CM | POA: Diagnosis not present

## 2018-09-05 ENCOUNTER — Other Ambulatory Visit: Payer: Self-pay | Admitting: Family Medicine

## 2018-09-07 DIAGNOSIS — G479 Sleep disorder, unspecified: Secondary | ICD-10-CM | POA: Diagnosis not present

## 2018-09-07 DIAGNOSIS — J449 Chronic obstructive pulmonary disease, unspecified: Secondary | ICD-10-CM | POA: Diagnosis not present

## 2018-09-07 DIAGNOSIS — R0902 Hypoxemia: Secondary | ICD-10-CM | POA: Diagnosis not present

## 2018-09-07 DIAGNOSIS — I2783 Eisenmenger's syndrome: Secondary | ICD-10-CM | POA: Diagnosis not present

## 2018-09-08 DIAGNOSIS — J449 Chronic obstructive pulmonary disease, unspecified: Secondary | ICD-10-CM | POA: Diagnosis not present

## 2018-09-08 DIAGNOSIS — R0902 Hypoxemia: Secondary | ICD-10-CM | POA: Diagnosis not present

## 2018-09-08 DIAGNOSIS — I2783 Eisenmenger's syndrome: Secondary | ICD-10-CM | POA: Diagnosis not present

## 2018-09-08 DIAGNOSIS — G479 Sleep disorder, unspecified: Secondary | ICD-10-CM | POA: Diagnosis not present

## 2018-09-14 ENCOUNTER — Telehealth: Payer: Self-pay | Admitting: Internal Medicine

## 2018-09-14 MED ORDER — TIOTROPIUM BROMIDE MONOHYDRATE 2.5 MCG/ACT IN AERS: 2.0000 | INHALATION_SPRAY | Freq: Every day | RESPIRATORY_TRACT | 3 refills | Status: DC

## 2018-09-14 MED ORDER — BUDESONIDE-FORMOTEROL FUMARATE 160-4.5 MCG/ACT IN AERO: INHALATION_SPRAY | RESPIRATORY_TRACT | 3 refills | Status: DC

## 2018-09-14 NOTE — Telephone Encounter (Signed)
Refills of both spiriva and symbicort sent to pt's preferred pharmacy.  Called pt to let him know this had been done. Pt expressed understanding. Nothing further needed.

## 2018-10-04 ENCOUNTER — Encounter: Payer: Self-pay | Admitting: Internal Medicine

## 2018-10-04 ENCOUNTER — Ambulatory Visit: Payer: Medicare HMO | Admitting: Internal Medicine

## 2018-10-04 VITALS — BP 116/70 | HR 103 | Ht 63.0 in | Wt 228.0 lb

## 2018-10-04 DIAGNOSIS — J9612 Chronic respiratory failure with hypercapnia: Secondary | ICD-10-CM | POA: Diagnosis not present

## 2018-10-04 DIAGNOSIS — J9611 Chronic respiratory failure with hypoxia: Secondary | ICD-10-CM | POA: Diagnosis not present

## 2018-10-04 DIAGNOSIS — J449 Chronic obstructive pulmonary disease, unspecified: Secondary | ICD-10-CM | POA: Diagnosis not present

## 2018-10-04 DIAGNOSIS — Z23 Encounter for immunization: Secondary | ICD-10-CM

## 2018-10-04 NOTE — Patient Instructions (Signed)
Prednisone 10 mg 2 daily until better then 1 and a half daily    See Tammy NP w/in 4 weeks with all your medications, even over the counter meds, separated in two separate bags, the ones you take no matter what vs the ones you stop once you feel better and take only as needed when you feel you need them.   Tammy  will generate for you a new user friendly medication calendar that will put [place] all on the same page re: your medication use.

## 2018-10-04 NOTE — Progress Notes (Signed)
Subjective:    Patient ID: [name], male   DOB: Sep 03, 1951,  MRN: [id number]      Brief patient profile:  67  Year old morbidly obese wm who  quit smoking  2012   with GOLD 2 COPD y > chronic resp failure  With hypercarbia/hypoxemia c/w component of ohs.   History of Present Illness  09/01/2016  Acute extended ov/Kamya Watling re: onset of flare cough /sob  early to [facility] august 2017  Chief Complaint  Patient presents with  . Acute Visit    Pt c/o increase in SOB x 4 days. C/o prod cough with clear to white mucus and chest tightness. Pt states his O2 has been in the 80's. Pt denies f/c/s.   did improve transiently p last ov but never back to baseline then worse x 4 days with severe hacking cough/ min productive all white mucus and increase saba over baseline. Not using flutter valve or mucinex dm as instructed bu taking norc qid for back pain    >>rx Gabapentin for cough , pred taper , CT sinus >neg    09/21/16 Kozlow eval > not tested as on antihistamines> consider low dose maint pred     10/31/17 NP Continue on Symbicort and Spiriva . Rinse after use.  Taper prednisone as directed to 20mg  alternating with 10mg  daily .  Zpack to have on hold if symptoms worsen with discolored mucus .  Continue on Oxygen 4l/m .    12/23/17 Phone care for flare of purulent cough > rec zpak > improve    01/06/2018  f/u ov/Valeta Paz re:   02 3-4lpm  But only gold II criteria with hypercarbia c/w ohs component  Chief Complaint  Patient presents with  . Follow-up    f/u COPD, feels well after the abx, still coughing up yellow mucus,    doe = MMRC3 = can't walk 100 yards even at a slow pace at a flat grade s stopping due to sob  Even on 4lpm with adeq sats documented/ zmax helped a lot at last flare / much less saba need and sputum only slightly yellow using flutter  rec For nasty mucus >  Z pak refillable/ add to action plan Make sure if you turn down your 02 you are still at or above 90% at all  times   07/03/2018  f/u ov/Roda Lauture re: copd/ab steroid dep/ 02 dep   Now at prednisone = 20 mg daily  Chief Complaint  Patient presents with  . Follow-up    Breathing is "not too bad"- worse with hot weather. He is using his albuterol inhaler and neb with albuterol both 2 x daily on average.    Dyspnea:  MMRC3 = can't walk 100 yards even at a slow pace at a flat grade s stopping due to sob  Even on 02 which he's not adjusting to keep > 90% as prev instructed  Cough: min dry SABA use: as above, way too much neb with good hfa demonstrated today 02: 4lpm increase to 5 lpm walking  rec Goal is to keep in low 90's  - home concentrator to 5 lpm walking room and when you walk outside to 6 lpm then back to 4lpm  Try to reduce prednisone to 10 mg one and a half each am  Always try your proair first before going to your nebulizer as your technique for using it as improved enough to where the inhaler should work better/ quicker as rescue when you can't catch  your breath. See calendar for specific medication instructions   Separating the top medications from the bottom group is fundamental to providing you adequate care going forward.       10/04/2018  f/u ov/Leiann Sporer re:  Copd / ohs  Chief Complaint  Patient presents with  . Follow-up    Pt c/o chest tightness x 2 wks- took zpack without much relief.   Dyspnea:  MMRC3 = can't walk 100 yards even at a slow pace at a flat grade s stopping due to sob  On 5lpm  Cough: minimal clear  Sleeping: recliner 30-45 degrees SABA use: still over using daily  02:  4lpm  X  5lpm walking      No obvious day to day or daytime variability or assoc excess/ purulent sputum or mucus plugs or hemoptysis or   subjective wheeze or overt sinus or hb symptoms.   Sleeping as above  without nocturnal  or early am exacerbation  of respiratory  c/o's or need for noct saba. Also denies any obvious fluctuation of symptoms with weather or environmental changes or other aggravating  or alleviating factors except as outlined above   No unusual exposure hx or h/o childhood pna/ asthma or knowledge of premature birth.  Current Allergies, Complete Past Medical History, Past Surgical History, Family History, and Social History were reviewed in [employer] record.  ROS  The following are not active complaints unless bolded Hoarseness, sore throat, dysphagia, dental problems, itching, sneezing,  nasal congestion or discharge of excess mucus or purulent secretions, ear ache,   fever, chills, sweats, unintended wt loss or wt gain, classically pleuritic or exertional cp,  orthopnea pnd or arm/hand swelling  or leg swelling, presyncope, palpitations, abdominal pain, anorexia, nausea, vomiting, diarrhea  or change in bowel habits or change in bladder habits, change in stools or change in urine, dysuria, hematuria,  rash, arthralgias, visual complaints, headache, numbness, weakness or ataxia or problems with walking or coordination,  change in mood or  memory.        Current Meds - using med cal and brought in meds which correlate nicely  Medication Sig  . acetaminophen (TYLENOL) 325 MG tablet Take 650 mg by mouth every 6 (six) hours as needed.  Marland Kitchen albuterol (PROVENTIL HFA;VENTOLIN HFA) 108 (90 Base) MCG/ACT inhaler Inhale 2 puffs into the lungs every 6 (six) hours as needed for wheezing or shortness of breath.  Marland Kitchen albuterol (PROVENTIL) (2.5 MG/3ML) 0.083% nebulizer solution Take 3 mLs (2.5 mg total) every 6 (six) hours as needed by nebulization for wheezing or shortness of breath.  Marland Kitchen atorvastatin (LIPITOR) 10 MG tablet TAKE 0.5 TABLETS (5 MG TOTAL) BY MOUTH DAILY.  . budesonide-formoterol (SYMBICORT) 160-4.5 MCG/ACT inhaler INHALE 2 PUFFS INTO LUNGS TWICE DAILY  . cetirizine (ZYRTEC) 10 MG tablet Take 10 mg by mouth daily as needed (drainage).   . clotrimazole-betamethasone (LOTRISONE) cream APPLY TWICE A DAY FOR 10 TO 14 DAYS AS DIRECTED  .  dextromethorphan-guaiFENesin (MUCINEX DM) 30-600 MG 12hr tablet Take 1 tablet by mouth 2 (two) times daily.  . diclofenac (VOLTAREN) 75 MG EC tablet TAKE 1 TABLET (75 MG TOTAL) BY MOUTH 2 (TWO) TIMES DAILY.  . DULoxetine (CYMBALTA) 60 MG capsule Take 1 capsule by mouth daily before breakfast.   . fluticasone (FLONASE) 50 MCG/ACT nasal spray INHALE 2 SPRAYS INTO EACH NOSTIL EVERY DAY  . furosemide (LASIX) 40 MG tablet 3 tabs po qam - 2 tabs po pm  . gabapentin (NEURONTIN) 300  MG capsule TAKE 1 CAPSULE BY MOUTH THREE TIMES A DAY  . HYDROcodone-acetaminophen (NORCO) 10-325 MG tablet Take 1 tablet by mouth every 4 (four) hours as needed (cough not responding to mucinex dm). (Patient taking differently: Take 1 tablet by mouth every 6 (six) hours as needed (for pain). )  . KLOR-CON M20 20 MEQ tablet TAKE ONE TABLET BY MOUTH DAILY  . levothyroxine (SYNTHROID, LEVOTHROID) 200 MCG tablet TAKE 1 TABLET BY MOUTH EVERY DAY  . LORazepam (ATIVAN) 0.5 MG tablet TAKE 1 TABLET BY MOUTH EVERY 8 HOURS AS NEEDED  . losartan (COZAAR) 100 MG tablet Take 1 tablet (100 mg total) by mouth daily.  . montelukast (SINGULAIR) 10 MG tablet TAKE 1 TABLET (10 MG TOTAL) BY MOUTH AT BEDTIME.  Marland Kitchen OXYGEN Inhale 4 L into the lungs continuous.   . pantoprazole (PROTONIX) 40 MG tablet Take 1 tablet (40 mg total) by mouth daily.  . predniSONE (DELTASONE) 10 MG tablet Take 2 tablets (20 mg total) by mouth daily. (Patient taking differently: 1 1.2 tablets daily)  . ranitidine (ZANTAC) 150 MG tablet Take 150 mg by mouth at bedtime.    . sucralfate (CARAFATE) 1 g tablet Take 1 tablet (1 g total) by mouth 4 (four) times daily.  . tamsulosin (FLOMAX) 0.4 MG CAPS capsule TAKE 1 CAPSULE (0.4 MG TOTAL) BY MOUTH DAILY.  Marland Kitchen Tiotropium Bromide Monohydrate (SPIRIVA RESPIMAT) 2.5 MCG/ACT AERS Inhale 2 puffs into the lungs daily.  Marland Kitchen tiZANidine (ZANAFLEX) 4 MG capsule Take 4 mg by mouth 2 (two) times daily.             Objective:   Physical  Exam  Obese wm nad    10/04/2018       228  07/03/2018         226  04/03/2018       234  01/06/2018       238  09/09/2017       246   11/15/2016        250  10/15/16 248 lb 3.2 oz (112.6 kg)  09/21/16 251 lb 3.2 oz (113.9 kg)  09/20/16 248 lb (112.5 kg)     Vital signs reviewed - Note on arrival 02 sats  93% on 4lpm cont         HEENT: upper dentures/ poor lower dentition/ nl turbinates bilaterally, and oropharynx. Nl external ear canals without cough reflex   NECK :  without JVD/Nodes/TM/ nl carotid upstrokes bilaterally   LUNGS: no acc muscle use,  Nl contour chest which is clear to A and P bilaterally without cough on insp or exp maneuvers   CV:  RRR  no s3 or murmur or increase in P2, and 1+ pitting both legs sym   ABD:  Tensely obese but  nontender with nl inspiratory excursion in the supine position. No bruits or organomegaly appreciated, bowel sounds nl  MS:  Nl gait/ ext warm without deformities, calf tenderness, cyanosis or clubbing No obvious joint restrictions   SKIN: warm and dry without lesions    NEURO:  alert, approp, nl sensorium with  no motor or cerebellar deficits apparent.

## 2018-10-06 ENCOUNTER — Encounter: Payer: Self-pay | Admitting: Internal Medicine

## 2018-10-06 NOTE — Assessment & Plan Note (Signed)
Body mass index is 40.39 kg/m.  -  trending up slightly  Lab Results  Component Value Date   TSH 1.72 11/01/2016     Contributing to gerd risk/ doe/reviewed the need and the process to achieve and maintain neg calorie balance > defer f/u primary care including intermittently monitoring thyroid status       I had an extended discussion with the patient reviewing all relevant studies completed to date and  lasting 15 to 20 minutes of a 25 minute visit    Each maintenance medication was reviewed in detail including most importantly the difference between maintenance and prns and under what circumstances the prns are to be triggered using an action plan format that is not reflected in the computer generated alphabetically organized AVS but trather by a customized med calendar that reflects the AVS meds with confirmed 100% correlation.   In addition, Please see AVS for unique instructions that I personally wrote and verbalized to the the pt in detail and then reviewed with pt  by my nurse highlighting any  changes in therapy recommended at today's visit to their plan of care.

## 2018-10-06 NOTE — Assessment & Plan Note (Signed)
HCO3  08/13/17  = 38  C/w moderate  hypercarbia   Could not tolerate any form of face mask so limited to correcting his 02 to low 90s as target >  Adequate control on present rx, reviewed in detail with pt > no change in rx needed

## 2018-10-06 NOTE — Assessment & Plan Note (Signed)
Spirometry 10/15/2016  FEV1 1.41 (52%)  Ratio 61 p am symb/spiriva/saba w/in 4h - added prednisone 20 max and taper off for flares as part of action plan 10/15/2016   - 07/04/2017 changed to pred 20 ceiling and 10 mg floor  - 04/03/2018  After extensive coaching inhaler device  effectiveness =    75% from a baseline of 25% (short Ti)  -  07/03/2018  After extensive coaching inhaler device  effectiveness =    90%  So rec try reduce neb use if possible and pred taper to 15 mg daily   The goal with a chronic steroid dependent illness is always arriving at the lowest effective dose that controls the disease/symptoms and not accepting a set "formula" which is based on statistics or guidelines that don't always take into account patient  variability or the natural hx of the dz in every individual patient, which may well vary over time.  For now therefore I recommend the patient maintain  20 mg daily as ceiling then 15 mg daily as floor

## 2018-10-08 DIAGNOSIS — I2783 Eisenmenger's syndrome: Secondary | ICD-10-CM | POA: Diagnosis not present

## 2018-10-08 DIAGNOSIS — R0902 Hypoxemia: Secondary | ICD-10-CM | POA: Diagnosis not present

## 2018-10-08 DIAGNOSIS — G479 Sleep disorder, unspecified: Secondary | ICD-10-CM | POA: Diagnosis not present

## 2018-10-08 DIAGNOSIS — J449 Chronic obstructive pulmonary disease, unspecified: Secondary | ICD-10-CM | POA: Diagnosis not present

## 2018-10-11 ENCOUNTER — Other Ambulatory Visit: Payer: Self-pay | Admitting: Family Medicine

## 2018-10-11 DIAGNOSIS — M79642 Pain in left hand: Secondary | ICD-10-CM | POA: Diagnosis not present

## 2018-10-19 ENCOUNTER — Other Ambulatory Visit: Payer: Self-pay

## 2018-10-19 ENCOUNTER — Encounter: Payer: Self-pay | Admitting: Family Medicine

## 2018-10-19 ENCOUNTER — Ambulatory Visit (INDEPENDENT_AMBULATORY_CARE_PROVIDER_SITE_OTHER): Payer: Medicare HMO | Admitting: Family Medicine

## 2018-10-19 VITALS — BP 128/70 | HR 80 | Temp 98.5°F | Resp 20 | Ht 63.0 in | Wt 227.0 lb

## 2018-10-19 DIAGNOSIS — I5032 Chronic diastolic (congestive) heart failure: Secondary | ICD-10-CM

## 2018-10-19 DIAGNOSIS — N189 Chronic kidney disease, unspecified: Secondary | ICD-10-CM

## 2018-10-19 DIAGNOSIS — R233 Spontaneous ecchymoses: Secondary | ICD-10-CM | POA: Diagnosis not present

## 2018-10-19 DIAGNOSIS — R600 Localized edema: Secondary | ICD-10-CM | POA: Diagnosis not present

## 2018-10-19 DIAGNOSIS — I872 Venous insufficiency (chronic) (peripheral): Secondary | ICD-10-CM | POA: Diagnosis not present

## 2018-10-19 DIAGNOSIS — L97919 Non-pressure chronic ulcer of unspecified part of right lower leg with unspecified severity: Secondary | ICD-10-CM

## 2018-10-19 DIAGNOSIS — L97929 Non-pressure chronic ulcer of unspecified part of left lower leg with unspecified severity: Secondary | ICD-10-CM

## 2018-10-19 LAB — COMPLETE METABOLIC PANEL WITH GFR
AG RATIO: 1.7 (calc) (ref 1.0–2.5)
ALBUMIN MSPROF: 3.7 g/dL (ref 3.6–5.1)
ALT: 14 U/L (ref 9–46)
AST: 15 U/L (ref 10–35)
Alkaline phosphatase (APISO): 91 U/L (ref 40–115)
BILIRUBIN TOTAL: 0.4 mg/dL (ref 0.2–1.2)
BUN: 16 mg/dL (ref 7–25)
CALCIUM: 9.2 mg/dL (ref 8.6–10.3)
CHLORIDE: 92 mmol/L — AB (ref 98–110)
CO2: 40 mmol/L — ABNORMAL HIGH (ref 20–32)
Creat: 0.73 mg/dL (ref 0.70–1.25)
GFR, Est African American: 112 mL/min/{1.73_m2} (ref >=60)
GFR, Est Non African American: 97 mL/min/{1.73_m2} (ref >=60)
GLUCOSE: 120 mg/dL — AB (ref 65–99)
Globulin: 2.2 g/dL (calc) (ref 1.9–3.7)
POTASSIUM: 4.5 mmol/L (ref 3.5–5.3)
Sodium: 143 mmol/L (ref 135–146)
TOTAL PROTEIN: 5.9 g/dL — AB (ref 6.1–8.1)

## 2018-10-19 LAB — CBC WITH DIFFERENTIAL/PLATELET
BASOS ABS: 35 {cells}/uL (ref 0–200)
BASOS PCT: 0.3 %
EOS PCT: 0.4 %
Eosinophils Absolute: 47 cells/uL (ref 15–500)
HCT: 35.2 % — ABNORMAL LOW (ref 38.5–50.0)
Hemoglobin: 11 g/dL — ABNORMAL LOW (ref 13.2–17.1)
Lymphs Abs: 597 cells/uL — ABNORMAL LOW (ref 850–3900)
MCH: 26.6 pg — ABNORMAL LOW (ref 27.0–33.0)
MCHC: 31.3 g/dL — AB (ref 32.0–36.0)
MCV: 85 fL (ref 80.0–100.0)
MONOS PCT: 6.3 %
MPV: 11.6 fL (ref 7.5–12.5)
NEUTROS ABS: 10284 {cells}/uL — AB (ref 1500–7800)
Neutrophils Relative %: 87.9 %
Platelets: 221 10*3/uL (ref 140–400)
RBC: 4.14 10*6/uL — ABNORMAL LOW (ref 4.20–5.80)
RDW: 14.1 % (ref 11.0–15.0)
Total Lymphocyte: 5.1 %
WBC mixed population: 737 cells/uL (ref 200–950)
WBC: 11.7 10*3/uL — ABNORMAL HIGH (ref 3.8–10.8)

## 2018-10-19 NOTE — Progress Notes (Signed)
Patient ID: [name], male    DOB: May 20, 1951, 67 y.o.   MRN: [id number]  PCP: Donita Brooks, MD  Chief Complaint  Patient presents with  . Edema    RLE- redness and swelling to foot    Subjective:   [name] is a 67 y.o. male, presents to clinic with CC of 3 days of worsening of chronic bilateral LE edema with hx of and dx of venous stasis.  His sx are worse in R>L, with tightness and some redness to right foot and ankle.  He describes the pain as tightness, some burning.  Redness from his foot to mid shin is new.  There are no ulcers and he denies any drainage from the skin but endorses color change.  He denies any numbness, tingling, pallor.  I cannot see any recent labs - most recent available to me in chart 08/19/17 On 120 mg lasix q am and 80 mg lasix q pm - states he is compliant  He denies being on a blood thinner, but he does have bruising scattered to his left leg which appears slightly older, this is nearby some of old hyperpigmentation and scarring of his left leg where he previously had venous stasis ulcers.  He has bruising to bilateral arms.    Does have a history of having ulcers and needing to have Unna boots put on in clinic.  History of COPD and is on oxygen.  He is on a statin, Cymbalta, levothyroxine.  Otherwise he is on some allergy acid reflux medicine.  He denies any history of diabetes.  He has not been to vascular surgeon before.  Pt's PCP is unable to see him due to full schedule - todays problem doesn't not seem new, patient has tried some compression stockings that he bought himself, they seem to worsen some of the swelling especially around his feet.    Patient denies any fevers, chills, sweats, shortness of breath, chest pain, orthopnea, PND, weight gain.  He denies any warmth or pallor, cyanosis or coolness to lower extremities.  No new numbness.  Most recent pertinent HPI's done by PCP Dr. Tanya Nones copied below: 08/08/18 Patient has not been  seen since last year.  Yesterday, the patient noticed a weeping ulcer forming on the anterior surface of his left leg similar to what he went through last year.  There is weeping yellow serous drainage coming from a small 4 mm to 5 mm ulcer on the anterior surface of his left shin.  He has +1 pitting edema up to the level of his knee.  There is no erythema or warmth or evidence of cellulitis.  He does state that his legs been aching more recently.  He denies any increasing dyspnea.  He denies any chest pain.  He is compliant with his Lasix.  At that time, my plan was: Leg swelling with venous stasis ulcer forming on his left shin.  Patient was placed in an Radio broadcast assistant today.  I will recheck the patient on Friday.  Consider increasing diuretic if wound is not improving by Friday however there is no evidence of fluid overload on today's exam. 08/18/18 Patient missed his appointment last Friday.  His family was instructed to remove the Unna boot as he was unable to keep his appointment.  He is here today for recheck    Wt Readings from Last 3 Encounters:  08/08/18 229 lb (103.9 kg)  07/03/18 226 lb (102.5 kg)  05/01/18 227 lb 12.8  oz (103.3 kg)   Since his last visit, he has lost 6 pounds.  The wound on his lower left shin has completely scabbed over and is no longer draining fluid.  There is no evidence of cellulitis.  There is no warmth or pain.  He has +1 pitting edema in his legs but this is his baseline.  His lungs are clear to auscultation bilaterally with no crackles or wheezes.  He is accompanied today by his son who is also my patient.  His son seems to be slightly jaundiced.  He also seems possibly overmedicated.  He is currently taking oxycodone, gabapentin, Tylenol 3, Klonopin under the care of different physicians.  I have recommended that he discuss with him decreasing his medications as I believe they are too strong.  I would also check a CMP today to monitor for any evidence of liver dysfunction  given his slightly jaundiced appearance.   Wt Readings from Last 5 Encounters:  10/19/18 227 lb (103 kg)  10/04/18 228 lb (103.4 kg)  08/18/18 223 lb (101.2 kg)  08/08/18 229 lb (103.9 kg)  07/03/18 226 lb (102.5 kg)      Patient Active Problem List   Diagnosis Date Noted  . Sepsis (HCC) 08/08/2017  . Venous stasis ulcer of both lower extremities without varicose veins (HCC) 06/10/2017  . Cellulitis of leg, left 04/13/2017  . COPD GOLD II 10/16/2016  . Upper airway cough syndrome 09/02/2016  . COPD exacerbation (HCC) 03/15/2016  . Chronic respiratory failure with hypoxia and hypercapnia (HCC) 03/15/2016  . Morbid obesity due to excess calories (HCC) complicated by OHS/ hbp/ hyperlipidemia 03/15/2016  . Dyspnea 03/11/2016  . Chronic diastolic heart failure (HCC) 01/09/2016  . Pneumonia 12/28/2015  . Acute on chronic respiratory failure with hypoxia (HCC)   . Anxiety state 01/11/2011  . Oral candidiasis 05/04/2010  . PALPITATIONS 12/31/2009  . OSTEOARTHRITIS 12/29/2009  . Chronic respiratory failure with hypoxia (HCC) 09/10/2009  . Hypothyroidism 01/26/2008  . Essential hypertension 01/26/2008  . Chronic rhinitis 01/26/2008  . COPD with asthma (HCC) 01/26/2008  . G E R D 01/26/2008     Prior to Admission medications   Medication Sig Start Date End Date Taking? Authorizing Provider  acetaminophen (TYLENOL) 325 MG tablet Take 650 mg by mouth every 6 (six) hours as needed.   Yes [provider]  albuterol (PROVENTIL HFA;VENTOLIN HFA) 108 (90 Base) MCG/ACT inhaler Inhale 2 puffs into the lungs every 6 (six) hours as needed for wheezing or shortness of breath. 06/19/18  Yes Nyoka Cowden, MD  albuterol (PROVENTIL) (2.5 MG/3ML) 0.083% nebulizer solution Take 3 mLs (2.5 mg total) every 6 (six) hours as needed by nebulization for wheezing or shortness of breath. 10/31/17  Yes Parrett, Tammy S, NP  atorvastatin (LIPITOR) 10 MG tablet TAKE 0.5 TABLETS (5 MG TOTAL) BY MOUTH  DAILY. 07/24/18  Yes Donita Brooks, MD  budesonide-formoterol [facility]) 160-4.5 MCG/ACT inhaler INHALE 2 PUFFS INTO LUNGS TWICE DAILY 09/14/18  Yes Nyoka Cowden, MD  cetirizine (ZYRTEC) 10 MG tablet Take 10 mg by mouth daily as needed (drainage).    Yes [provider]  clotrimazole-betamethasone (LOTRISONE) cream APPLY TWICE A DAY FOR 10 TO 14 DAYS AS DIRECTED 10/11/18  Yes Donita Brooks, MD  dextromethorphan-guaiFENesin [facility]) 30-600 MG 12hr tablet Take 1 tablet by mouth 2 (two) times daily.   Yes [provider]  diclofenac (VOLTAREN) 75 MG EC tablet TAKE 1 TABLET (75 MG TOTAL) BY MOUTH 2 (  TWO) TIMES DAILY. 07/13/18  Yes Donita Brooks, MD  DULoxetine (CYMBALTA) 60 MG capsule Take 1 capsule by mouth daily before breakfast.  05/14/15  Yes [provider]  fluticasone (FLONASE) 50 MCG/ACT nasal spray INHALE 2 SPRAYS INTO EACH NOSTIL EVERY DAY 11/14/17  Yes Nyoka Cowden, MD  furosemide (LASIX) 40 MG tablet 3 tabs po qam - 2 tabs po pm 03/13/18  Yes Donita Brooks, MD  gabapentin (NEURONTIN) 300 MG capsule TAKE 1 CAPSULE BY MOUTH THREE TIMES A DAY 08/01/18  Yes Nyoka Cowden, MD  HYDROcodone-acetaminophen (NORCO) 10-325 MG tablet Take 1 tablet by mouth every 4 (four) hours as needed (cough not responding to mucinex dm). Patient taking differently: Take 1 tablet by mouth every 6 (six) hours as needed (for pain).  04/28/16  Yes Nyoka Cowden, MD  KLOR-CON M20 20 MEQ tablet TAKE ONE TABLET BY MOUTH DAILY 04/17/18  Yes Donita Brooks, MD  levothyroxine (SYNTHROID, LEVOTHROID) 200 MCG tablet TAKE 1 TABLET BY MOUTH EVERY DAY 01/23/18  Yes Donita Brooks, MD  LORazepam (ATIVAN) 0.5 MG tablet TAKE 1 TABLET BY MOUTH EVERY 8 HOURS AS NEEDED 02/23/18  Yes Nyoka Cowden, MD  losartan (COZAAR) 100 MG tablet Take 1 tablet (100 mg total) by mouth daily. 08/19/17  Yes Donita Brooks, MD  montelukast (SINGULAIR) 10 MG tablet TAKE 1 TABLET (10 MG TOTAL) BY MOUTH AT  BEDTIME. 07/03/18  Yes Nyoka Cowden, MD  OXYGEN Inhale 4 L into the lungs continuous.    Yes [provider]  pantoprazole (PROTONIX) 40 MG tablet Take 1 tablet (40 mg total) by mouth daily. 11/07/17  Yes Nyoka Cowden, MD  predniSONE (DELTASONE) 10 MG tablet Take 2 tablets (20 mg total) by mouth daily. Patient taking differently: 1 1.2 tablets daily 07/17/18  Yes Nyoka Cowden, MD  ranitidine (ZANTAC) 150 MG tablet Take 150 mg by mouth at bedtime.     Yes [provider]  sucralfate (CARAFATE) 1 g tablet Take 1 tablet (1 g total) by mouth 4 (four) times daily. 03/01/18  Yes Donita Brooks, MD  tamsulosin (FLOMAX) 0.4 MG CAPS capsule TAKE 1 CAPSULE (0.4 MG TOTAL) BY MOUTH DAILY. 03/01/18  Yes Donita Brooks, MD  Tiotropium Bromide Monohydrate (SPIRIVA RESPIMAT) 2.5 MCG/ACT AERS Inhale 2 puffs into the lungs daily. 09/14/18  Yes Nyoka Cowden, MD  tiZANidine (ZANAFLEX) 4 MG capsule Take 4 mg by mouth 2 (two) times daily.    Yes [provider]     Allergies  Allergen Reactions  . Penicillins Swelling and Rash    Has patient had a PCN reaction causing immediate rash, facial/tongue/throat swelling, SOB or lightheadedness with hypotension: Yes Has patient had a PCN reaction causing severe rash involving mucus membranes or skin necrosis: Yes Has patient had a PCN reaction that required hospitalization: No Has patient had a PCN reaction occurring within the last 10 years: No If all of the above answers are "NO", then may proceed with Cephalosporin use.        Review of Systems  Constitutional: Negative.   HENT: Negative.   Eyes: Negative.   Respiratory: Negative.   Cardiovascular: Negative.   Gastrointestinal: Negative.   Endocrine: Negative.   Genitourinary: Negative.   Musculoskeletal: Negative.   Skin: Negative.   Allergic/Immunologic: Negative.   Neurological: Negative.   Hematological: Negative.   Psychiatric/Behavioral: Negative.   All  other systems reviewed and are negative.  Objective:    Vitals:   10/19/18 1209  BP: 128/70  Pulse: 80  Resp: 20  Temp: 98.5 F (36.9 C)  TempSrc: Oral  SpO2: 92%  Weight: 227 lb (103 kg)  Height: 5\' 3"  (1.6 m)      Physical Exam  Constitutional: He appears well-developed. No distress.  Chronically ill-appearing male, no acute distress  HENT:  Head: Normocephalic and atraumatic.  Nose: Nose normal.  Eyes: Conjunctivae are normal. Right eye exhibits no discharge. Left eye exhibits no discharge.  Neck: No tracheal deviation present.  Cardiovascular: Normal rate and regular rhythm. Exam reveals no gallop.  Bilateral 2+ pitting edema - to foot ankle and shin, ttp  Pulmonary/Chest: Effort normal. No stridor. No respiratory distress.  Musculoskeletal: Normal range of motion.  Neurological: He is alert. He exhibits normal muscle tone. Coordination normal.  Skin: Skin is warm and dry. Bruising and ecchymosis noted. He is not diaphoretic. There is erythema. No cyanosis. No pallor. Nails show no clubbing.  Psychiatric: He has a normal mood and affect. His behavior is normal.  Nursing note and vitals reviewed.   Right LE:  Left LE:  Left UE:     Wt Readings from Last 5 Encounters:  10/19/18 227 lb (103 kg)  10/04/18 228 lb (103.4 kg)  08/18/18 223 lb (101.2 kg)  08/08/18 229 lb (103.9 kg)         Assessment & Plan:      ICD-10-CM   1. Venous stasis ulcer of both lower extremities without varicose veins (HCC) I87.2 COMPLETE METABOLIC PANEL WITH GFR   L97.919 Urinalysis, Routine w reflex microscopic   L97.929 CBC with Differential    Ambulatory referral to Vascular Surgery  2. Chronic diastolic heart failure (HCC) I50.32   3. Chronic renal failure, unspecified CKD stage N18.9 COMPLETE METABOLIC PANEL WITH GFR  4. Lower extremity edema R60.0 COMPLETE METABOLIC PANEL WITH GFR    Urinalysis, Routine w reflex microscopic    Brain natriuretic peptide    CBC  with Differential    Ambulatory referral to Vascular Surgery  5. Chronic stasis dermatitis of right lower extremity I87.2 Ambulatory referral to Vascular Surgery  6. Spontaneous bruising R23.3 Protime-INR    Per the chart there is a hx of chronic diastolic HF - he sees cardiology, he denies any change to his breathing.  Still using same amount of O2.    Few concerns today -  He is on a very large amount of Lasix, his states that his still making good amount of urine, clinically does not seem to have any CHF exacerbation, but I cannot see recent labs so would like to check renal function, electrolytes, liver function, BNP -spontaneous bruising is also concerning with increased fluid would like to make sure there is no coagulopathy or liver failure, although I do think this is less likely and he does not appear jaundice.    Do think the erythema to his lower extremities is secondary to stasis dermatitis and am less concerned for cellulitis at this time as there is no increased warmth, no induration, currently no ulcers of his skin, his lower extremity pulses are very difficult to palpate with the amount of swelling but the temperature of both his extremities feels the same, they are not cool, there is no rubor, pallor, cyanosis and he does have good capillary refill in the lower extremities.  Unna boots today, labs, will see if renal function will tolerate any increase in diuretics, return in ~5 days  for recheck.  Feel pt would benefit from vascular referral for PVD.   Danelle Berry, PA-C 10/19/18 12:18 PM

## 2018-10-19 NOTE — Patient Instructions (Signed)
I did a referral to vascular - you will be called to arrange that, expect to hear something in the next 1-2 weeks.  Return to clinic in 4-5 days to have unna boot removed and recheck leg.  I will call you with labs.

## 2018-10-20 ENCOUNTER — Other Ambulatory Visit: Payer: Self-pay | Admitting: Family Medicine

## 2018-10-20 LAB — URINALYSIS, ROUTINE W REFLEX MICROSCOPIC
Bacteria, UA: NONE SEEN /HPF
Bilirubin Urine: NEGATIVE
Glucose, UA: NEGATIVE
HYALINE CAST: NONE SEEN /LPF
Hgb urine dipstick: NEGATIVE
Nitrite: NEGATIVE
RBC / HPF: NONE SEEN /HPF (ref 0–2)
SPECIFIC GRAVITY, URINE: 1.025 (ref 1.001–1.03)
SQUAMOUS EPITHELIAL / LPF: NONE SEEN /HPF (ref <=5)
WBC, UA: NONE SEEN /HPF (ref 0–5)
pH: <=5 (ref 5.0–8.0)

## 2018-10-20 LAB — PROTIME-INR
INR: 0.9
PROTHROMBIN TIME: 9.9 s (ref 9.0–11.5)

## 2018-10-20 LAB — BRAIN NATRIURETIC PEPTIDE: BRAIN NATRIURETIC PEPTIDE: 31 pg/mL (ref <100)

## 2018-10-23 ENCOUNTER — Other Ambulatory Visit: Payer: Self-pay

## 2018-10-23 DIAGNOSIS — L97909 Non-pressure chronic ulcer of unspecified part of unspecified lower leg with unspecified severity: Secondary | ICD-10-CM

## 2018-10-23 DIAGNOSIS — I872 Venous insufficiency (chronic) (peripheral): Secondary | ICD-10-CM

## 2018-10-23 DIAGNOSIS — L03116 Cellulitis of left lower limb: Secondary | ICD-10-CM

## 2018-10-24 ENCOUNTER — Ambulatory Visit (INDEPENDENT_AMBULATORY_CARE_PROVIDER_SITE_OTHER): Payer: Medicare HMO | Admitting: Family Medicine

## 2018-10-24 ENCOUNTER — Other Ambulatory Visit: Payer: Self-pay | Admitting: Internal Medicine

## 2018-10-24 ENCOUNTER — Other Ambulatory Visit: Payer: Self-pay | Admitting: Family Medicine

## 2018-10-24 ENCOUNTER — Other Ambulatory Visit: Payer: Self-pay

## 2018-10-24 ENCOUNTER — Encounter: Payer: Self-pay | Admitting: Family Medicine

## 2018-10-24 VITALS — BP 130/72 | HR 104 | Temp 98.7°F | Resp 18 | Ht 63.0 in | Wt 229.0 lb

## 2018-10-24 DIAGNOSIS — I872 Venous insufficiency (chronic) (peripheral): Secondary | ICD-10-CM

## 2018-10-24 DIAGNOSIS — D649 Anemia, unspecified: Secondary | ICD-10-CM

## 2018-10-24 MED ORDER — SULFAMETHOXAZOLE-TRIMETHOPRIM 800-160 MG PO TABS: 1.0000 | ORAL_TABLET | Freq: Two times a day (BID) | ORAL | 0 refills | Status: AC

## 2018-10-24 NOTE — Patient Instructions (Addendum)
I will contact you about imaging - I want to check with Dr. Tanya Nones first about it.  It will take me a day or two to complete all the paper work for prescription of compression stockings - I will call you with that once I have completed  Do stool card and return to [place].  I would get a routine follow up visit scheduled out a few weeks with your PCP.   Chronic Venous Insufficiency Chronic venous insufficiency, also called venous stasis, is a condition that prevents blood from being pumped effectively through the veins in your legs. Blood may no longer be pumped effectively from the legs back to the heart. This condition can range from mild to severe. With proper treatment, you should be able to continue with an active life. What are the causes? Chronic venous insufficiency occurs when the vein walls become stretched, weakened, or damaged, or when valves within the vein are damaged. Some common causes of this include:  High blood pressure inside the veins (venous hypertension).  Increased blood pressure in the leg veins from long periods of sitting or standing.  A blood clot that blocks blood flow in a vein (deep vein thrombosis, DVT).  Inflammation of a vein (phlebitis) that causes a blood clot to form.  Tumors in the pelvis that cause blood to back up.  What increases the risk? The following factors may make you more likely to develop this condition:  Having a family history of this condition.  Obesity.  Pregnancy.  Living without enough physical activity or exercise (sedentary lifestyle).  Smoking.  Having a job that requires long periods of standing or sitting in one place.  Being a certain age. Women in their 35s and 3s and men in their 20s are more likely to develop this condition.  What are the signs or symptoms? Symptoms of this condition include:  Veins that are enlarged, bulging, or twisted (varicose veins).  Skin breakdown or ulcers.  Reddened or discolored  skin on the front of the leg.  Brown, smooth, tight, and painful skin just above the ankle, usually on the inside of the leg (lipodermatosclerosis).  Swelling.  How is this diagnosed? This condition may be diagnosed based on:  Your medical history.  A physical exam.  Tests, such as: ? A procedure that creates an image of a blood vessel and nearby organs and provides information about blood flow through the blood vessel (duplex ultrasound). ? A procedure that tests blood flow (plethysmography). ? A procedure to look at the veins using X-ray and dye (venogram).  How is this treated? The goals of treatment are to help you return to an active life and to minimize pain or disability. Treatment depends on the severity of your condition, and it may include:  Wearing compression stockings. These can help relieve symptoms and help prevent your condition from getting worse. However, they do not cure the condition.  Sclerotherapy. This is a procedure involving an injection of a material that "dissolves" damaged veins.  Surgery. This may involve: ? Removing a diseased vein (vein stripping). ? Cutting off blood flow through the vein (laser ablation surgery). ? Repairing a valve.  Follow these instructions at home:  Wear compression stockings as told by your health care provider. These stockings help to prevent blood clots and reduce swelling in your legs.  Take over-the-counter and prescription medicines only as told by your health care provider.  Stay active by exercising, walking, or doing different activities. Ask your health  care provider what activities are safe for you and how much exercise you need.  Drink enough fluid to keep your urine clear or pale yellow.  Do not use any products that contain nicotine or tobacco, such as cigarettes and e-cigarettes. If you need help quitting, ask your health care provider.  Keep all follow-up visits as told by your health care provider. This  is important. Contact a health care provider if:  You have redness, swelling, or more pain in the affected area.  You see a red streak or line that extends up or down from the affected area.  You have skin breakdown or a loss of skin in the affected area, even if the breakdown is small.  You get an injury in the affected area. Get help right away if:  You get an injury and an open wound in the affected area.  You have severe pain that does not get better with medicine.  You have sudden numbness or weakness in the foot or ankle below the affected area, or you have trouble moving your foot or ankle.  You have a fever and you have worse or persistent symptoms.  You have chest pain.  You have shortness of breath. Summary  Chronic venous insufficiency, also called venous stasis, is a condition that prevents blood from being pumped effectively through the veins in your legs.  Chronic venous insufficiency occurs when the vein walls become stretched, weakened, or damaged, or when valves within the vein are damaged.  Treatment for this condition depends on how severe your condition is, and it may involve wearing compression stockings or having a procedure.  Make sure you stay active by exercising, walking, or doing different activities. Ask your health care provider what activities are safe for you and how much exercise you need. This information is not intended to replace advice given to you by your health care provider. Make sure you discuss any questions you have with your health care provider. Document Released: 04/04/2007 Document Revised: 10/18/2016 Document Reviewed: 10/18/2016 Elsevier Interactive Patient Education  2017 [employer].

## 2018-10-24 NOTE — Progress Notes (Signed)
Patient ID: [name], male    DOB: [birth year], 67 y.o.   MRN: [id number]  PCP: Donita BrooksPickard, Warren T, MD  Chief Complaint  Patient presents with  . Follow-up    venous stasis    Subjective:   Samuel DakinMichael [name] is a 67 y.o. male, presents to clinic with CC right LE swelling and redness with hx of venous stasis, he had unna boots placed last week (Thursday here in office 5 days ago), his son cut them off this am and was concerned with worsening redness to right foot ankle and shin.  He took more lasix than normal over the weekend after we notified him of good renal function and ability to increase the dose for a few days.  He states he peed all weekend.  He has not lost any weight weighing in today.  In looking at both of his legs he believes that the swelling on the right leg has gone down somewhat but he continues to have some tightness in the skin in the foot ankle and shin and some pain with palpation and with ambulation.  There has been no drainage or weeping from his skin in his right leg ankle or foot, there are no wounds.  He denies any fever, chills, sweats.  He did start Z-Pak 4 days ago for COPD exacerbation.    Patient Active Problem List   Diagnosis Date Noted  . Sepsis (HCC) 08/08/2017  . Venous stasis ulcer of both lower extremities without varicose veins (HCC) 06/10/2017  . Cellulitis of leg, left 04/13/2017  . COPD GOLD II 10/16/2016  . Upper airway cough syndrome 09/02/2016  . COPD exacerbation (HCC) 03/15/2016  . Chronic respiratory failure with hypoxia and hypercapnia (HCC) 03/15/2016  . Morbid obesity due to excess calories (HCC) complicated by OHS/ hbp/ hyperlipidemia 03/15/2016  . Dyspnea 03/11/2016  . Chronic diastolic heart failure (HCC) 01/09/2016  . Pneumonia 12/28/2015  . Acute on chronic respiratory failure with hypoxia (HCC)   . Anxiety state 01/11/2011  . Oral candidiasis 05/04/2010  . PALPITATIONS 12/31/2009  . OSTEOARTHRITIS 12/29/2009  . Chronic  respiratory failure with hypoxia (HCC) 09/10/2009  . Hypothyroidism 01/26/2008  . Essential hypertension 01/26/2008  . Chronic rhinitis 01/26/2008  . COPD with asthma (HCC) 01/26/2008  . G E R D 01/26/2008     Prior to Admission medications   Medication Sig Start Date End Date Taking? Authorizing Provider  acetaminophen (TYLENOL) 325 MG tablet Take 650 mg by mouth every 6 (six) hours as needed.   Yes [provider]  albuterol (PROVENTIL HFA;VENTOLIN HFA) 108 (90 Base) MCG/ACT inhaler Inhale 2 puffs into the lungs every 6 (six) hours as needed for wheezing or shortness of breath. 06/19/18  Yes Nyoka CowdenWert, Elson B, MD  albuterol (PROVENTIL) (2.5 MG/3ML) 0.083% nebulizer solution Take 3 mLs (2.5 mg total) every 6 (six) hours as needed by nebulization for wheezing or shortness of breath. 10/31/17  Yes Parrett, Tammy S, NP  atorvastatin (LIPITOR) 10 MG tablet TAKE 0.5 TABLETS (5 MG TOTAL) BY MOUTH DAILY. 07/24/18  Yes Donita BrooksPickard, Warren T, MD  azithromycin (ZITHROMAX) 250 MG tablet Take 250 mg by mouth daily. Has 2x days left   Yes [provider]  budesonide-formoterol (SYMBICORT) 160-4.5 MCG/ACT inhaler INHALE 2 PUFFS INTO LUNGS TWICE DAILY 09/14/18  Yes Nyoka CowdenWert, Shahiem B, MD  cetirizine (ZYRTEC) 10 MG tablet Take 10 mg by mouth daily as needed (drainage).    Yes [provider]  clotrimazole-betamethasone (LOTRISONE) cream APPLY TWICE  A DAY FOR 10 TO 14 DAYS AS DIRECTED 10/11/18  Yes Donita Brooks, MD  dextromethorphan-guaiFENesin [facility] DM) 30-600 MG 12hr tablet Take 1 tablet by mouth 2 (two) times daily.   Yes [provider]  diclofenac (VOLTAREN) 75 MG EC tablet TAKE 1 TABLET (75 MG TOTAL) BY MOUTH 2 (TWO) TIMES DAILY. 10/20/18  Yes Donita Brooks, MD  DULoxetine (CYMBALTA) 60 MG capsule Take 1 capsule by mouth daily before breakfast.  05/14/15  Yes [provider]  fluticasone (FLONASE) 50 MCG/ACT nasal spray INHALE 2 SPRAYS INTO EACH NOSTIL EVERY DAY  11/14/17  Yes Nyoka Cowden, MD  furosemide (LASIX) 40 MG tablet 3 tabs po qam - 2 tabs po pm 03/13/18  Yes Donita Brooks, MD  gabapentin (NEURONTIN) 300 MG capsule TAKE 1 CAPSULE BY MOUTH THREE TIMES A DAY 08/01/18  Yes Nyoka Cowden, MD  HYDROcodone-acetaminophen (NORCO) 10-325 MG tablet Take 1 tablet by mouth every 4 (four) hours as needed (cough not responding to mucinex dm). Patient taking differently: Take 1 tablet by mouth every 6 (six) hours as needed (for pain).  04/28/16  Yes Nyoka Cowden, MD  KLOR-CON M20 20 MEQ tablet TAKE ONE TABLET BY MOUTH DAILY 04/17/18  Yes Donita Brooks, MD  levothyroxine (SYNTHROID, LEVOTHROID) 200 MCG tablet TAKE 1 TABLET BY MOUTH EVERY DAY 01/23/18  Yes Donita Brooks, MD  LORazepam (ATIVAN) 0.5 MG tablet TAKE 1 TABLET EVERY 8 HOURS AS NEEDED 10/24/18  Yes Nyoka Cowden, MD  losartan (COZAAR) 100 MG tablet TAKE 1 TABLET BY MOUTH EVERY DAY 10/24/18  Yes Donita Brooks, MD  montelukast (SINGULAIR) 10 MG tablet TAKE 1 TABLET (10 MG TOTAL) BY MOUTH AT BEDTIME. 07/03/18  Yes Nyoka Cowden, MD  OXYGEN Inhale 4 L into the lungs continuous.    Yes [provider]  pantoprazole (PROTONIX) 40 MG tablet Take 1 tablet (40 mg total) by mouth daily. 11/07/17  Yes Nyoka Cowden, MD  predniSONE (DELTASONE) 10 MG tablet Take 2 tablets (20 mg total) by mouth daily. Patient taking differently: 1 1.2 tablets daily 07/17/18  Yes Nyoka Cowden, MD  ranitidine (ZANTAC) 150 MG tablet Take 150 mg by mouth at bedtime.     Yes [provider]  sucralfate (CARAFATE) 1 g tablet Take 1 tablet (1 g total) by mouth 4 (four) times daily. 03/01/18  Yes Donita Brooks, MD  tamsulosin (FLOMAX) 0.4 MG CAPS capsule TAKE 1 CAPSULE (0.4 MG TOTAL) BY MOUTH DAILY. 03/01/18  Yes Donita Brooks, MD  Tiotropium Bromide Monohydrate (SPIRIVA RESPIMAT) 2.5 MCG/ACT AERS Inhale 2 puffs into the lungs daily. 09/14/18  Yes Nyoka Cowden, MD  tiZANidine (ZANAFLEX) 4 MG  capsule Take 4 mg by mouth 2 (two) times daily.    Yes [provider]     Allergies  Allergen Reactions  . Penicillins Swelling and Rash    Has patient had a PCN reaction causing immediate rash, facial/tongue/throat swelling, SOB or lightheadedness with hypotension: Yes Has patient had a PCN reaction causing severe rash involving mucus membranes or skin necrosis: Yes Has patient had a PCN reaction that required hospitalization: No Has patient had a PCN reaction occurring within the last 10 years: No If all of the above answers are "NO", then may proceed with Cephalosporin use.      Family History  Problem Relation Age of Onset  . Pancreatic cancer Mother   . Diabetes Mother   . Pancreatic  cancer Brother   . Throat cancer Brother   . Diabetes Brother   . Heart attack Brother      Social History   Socioeconomic History  . Marital status: Married    Spouse name: Not on file  . Number of children: Not on file  . Years of education: Not on file  . Highest education level: Not on file  Occupational History  . Not on file  Social Needs  . Financial resource strain: Not on file  . Food insecurity:    Worry: Not on file    Inability: Not on file  . Transportation needs:    Medical: Not on file    Non-medical: Not on file  Tobacco Use  . Smoking status: Former Smoker    Years: 40.00    Types: Cigarettes    Last attempt to quit: 12/13/2010    Years since quitting: 7.8  . Smokeless tobacco: Never Used  Substance and Sexual Activity  . Alcohol use: No    Alcohol/week: 0.0 standard drinks  . Drug use: No  . Sexual activity: Not on file  Lifestyle  . Physical activity:    Days per week: Not on file    Minutes per session: Not on file  . Stress: Not on file  Relationships  . Social connections:    Talks on phone: Not on file    Gets together: Not on file    Attends religious service: Not on file    Active member of club or organization: Not on file     Attends meetings of clubs or organizations: Not on file    Relationship status: Not on file  . Intimate partner violence:    Fear of current or ex partner: Not on file    Emotionally abused: Not on file    Physically abused: Not on file    Forced sexual activity: Not on file  Other Topics Concern  . Not on file  Social History Narrative  . Not on file     Review of Systems  Constitutional: Negative.   HENT: Negative.   Eyes: Negative.   Respiratory: Negative.   Cardiovascular: Negative.   Gastrointestinal: Negative.   Endocrine: Negative.   Genitourinary: Negative.   Musculoskeletal: Negative.   Skin: Negative.   Allergic/Immunologic: Negative.   Neurological: Negative.   Hematological: Negative.   Psychiatric/Behavioral: Negative.   All other systems reviewed and are negative.      Objective:    Vitals:   10/24/18 1408  BP: 130/72  Pulse: (!) 104  Resp: 18  Temp: 98.7 F (37.1 C)  TempSrc: Oral  SpO2: 94%  Weight: 229 lb (103.9 kg)  Height: 5\' 3"  (1.6 m)      Physical Exam  Constitutional: He appears well-developed and well-nourished. No distress.  HENT:  Head: Normocephalic and atraumatic.  Nose: Nose normal.  Eyes: Conjunctivae are normal. Right eye exhibits no discharge. Left eye exhibits no discharge.  Neck: No tracheal deviation present.  Cardiovascular: Normal rate and regular rhythm.  Pulmonary/Chest: Effort normal. No stridor. No respiratory distress.  Musculoskeletal: Normal range of motion.  Neurological: He is alert. He exhibits normal muscle tone. Coordination normal.  Skin: Skin is warm and dry. No rash noted. He is not diaphoretic. There is erythema.  Worsening erythema to RLE with improved edema 1+ pedal and pretibial edema No warmth Erythema blanching  Skin intact, no drainage  Psychiatric: He has a normal mood and affect. His behavior is normal.  Nursing note and vitals reviewed.    10/19/18 right LE:       10/24/18 right  LE:     Wt Readings from Last 5 Encounters:  10/24/18 229 lb (103.9 kg)  10/19/18 227 lb (103 kg)  10/04/18 228 lb (103.4 kg)  08/18/18 223 lb (101.2 kg)  08/08/18 229 lb (103.9 kg)            Assessment & Plan:      ICD-10-CM   1. Chronic stasis dermatitis of right lower extremity I87.2 AMB referral to rehabilitation    AMB referral to wound care center  2. Anemia, unspecified type D64.9 Fecal Globin By Immunochemistry    Redness worse in skin to right leg and foot Swelling  A little bit better Tenderness still persists with pressing on skin or with walking No warmth, no decreased ROM to toes, foot, ankle  I suspect erythema is from chronic venous stasis, stasis dermatitis and/or venous eczema (or consider lipodermatosclerosis?) Dr. Tanya Nones was unavailable to personally examine pt today but was able to review pictures, he advises coverage with abx for staph/strep - he advised keflex, however pt has allergy so have added bactrim and notified pt.  Did not want to worsen again with unna boot - so pt is going to put on compression stockings, elevate, instructed to hydrate skin with vaseline.    Order for compression stocking 20-30 mmHg given to pt  He has follow up with Dr. Tanya Nones next week. Wound clinic to eval - may have minimal that they can do without current wounds Vascular referral pending    Anemia - last CBC from several years ago, may be from CKD?  Normocytic.  Pt checking hemoccult with test he will return to clinic.     Danelle Berry, PA-C 10/24/18 2:43 PM

## 2018-10-25 ENCOUNTER — Other Ambulatory Visit: Payer: Self-pay

## 2018-10-25 ENCOUNTER — Ambulatory Visit (HOSPITAL_COMMUNITY): Payer: Medicare HMO | Attending: Family Medicine | Admitting: Physical Therapy

## 2018-10-25 DIAGNOSIS — M79661 Pain in right lower leg: Secondary | ICD-10-CM | POA: Diagnosis not present
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
## 2018-10-27 ENCOUNTER — Encounter: Payer: Self-pay | Admitting: *Deleted

## 2018-10-30 ENCOUNTER — Ambulatory Visit: Payer: Medicare HMO | Admitting: Family Medicine

## 2018-10-31 ENCOUNTER — Telehealth (HOSPITAL_COMMUNITY): Payer: Self-pay | Admitting: Physical Therapy

## 2018-10-31 ENCOUNTER — Ambulatory Visit (HOSPITAL_COMMUNITY): Payer: Medicare HMO | Admitting: Physical Therapy

## 2018-10-31 DIAGNOSIS — M79661 Pain in right lower leg: Secondary | ICD-10-CM | POA: Diagnosis not present

## 2018-10-31 NOTE — Telephone Encounter (Signed)
Pt's son called states they are leaving the house now his Dad had to take a breathing treatment (COPD) before he could come. They will be here at 4:30pm N F 10/31/18
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
## 2018-11-07 ENCOUNTER — Encounter: Payer: Self-pay | Admitting: Adult Health

## 2018-11-07 ENCOUNTER — Ambulatory Visit (INDEPENDENT_AMBULATORY_CARE_PROVIDER_SITE_OTHER): Payer: Medicare HMO | Admitting: Adult Health

## 2018-11-07 DIAGNOSIS — J9611 Chronic respiratory failure with hypoxia: Secondary | ICD-10-CM

## 2018-11-07 DIAGNOSIS — J449 Chronic obstructive pulmonary disease, unspecified: Secondary | ICD-10-CM

## 2018-11-07 DIAGNOSIS — I5032 Chronic diastolic (congestive) heart failure: Secondary | ICD-10-CM

## 2018-11-07 MED ORDER — BUDESONIDE-FORMOTEROL FUMARATE 160-4.5 MCG/ACT IN AERO: 2.0000 | INHALATION_SPRAY | Freq: Two times a day (BID) | RESPIRATORY_TRACT | 0 refills | Status: DC

## 2018-11-07 MED ORDER — TIOTROPIUM BROMIDE MONOHYDRATE 2.5 MCG/ACT IN AERS: 2.0000 | INHALATION_SPRAY | Freq: Every day | RESPIRATORY_TRACT | 0 refills | Status: DC

## 2018-11-07 NOTE — Patient Instructions (Addendum)
Continue on Symbicort and Spiriva . Rinse after use.  Continue on Oxygen 4l/m .  Activity as tolerated.  Work on healthy weight loss  Decrease Prednisone 20mg   alternate 15mg  (1.5 tabs ) daily for 2 weeks then 15mg  daily (1.5 tabs )  Follow med calendar closely and bring to each visit.  Follow up with Dr. Sherene SiresWert  In 3 months and As needed   Please contact office for sooner follow up if symptoms do not improve or worsen or seek emergency care

## 2018-11-07 NOTE — Assessment & Plan Note (Signed)
Continue on O2 

## 2018-11-07 NOTE — Addendum Note (Signed)
Addended by: Boone MasterJONES, JESSICA E on: 11/07/2018 05:03 PM   Modules accepted: Orders

## 2018-11-07 NOTE — Assessment & Plan Note (Signed)
Improved control  Will try to cut back on steroid dose   Plan  Patient Instructions  Continue on Symbicort and Spiriva . Rinse after use.  Continue on Oxygen 4l/m .  Activity as tolerated.  Decrease Prednisone 20mg   alternate 15mg  (1.5 tabs ) daily for 2 weeks then 15mg  daily (1.5 tabs )  Follow med calendar closely and bring to each visit.  Follow up with Dr. Sherene SiresWert  In 3 months and As needed   Please contact office for sooner follow up if symptoms do not improve or worsen or seek emergency care

## 2018-11-07 NOTE — Assessment & Plan Note (Signed)
Wt loss  
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
## 2018-11-07 NOTE — Assessment & Plan Note (Signed)
Appears controlled  Cont on current regimen

## 2018-11-08 DIAGNOSIS — I2783 Eisenmenger's syndrome: Secondary | ICD-10-CM | POA: Diagnosis not present

## 2018-11-08 DIAGNOSIS — R0902 Hypoxemia: Secondary | ICD-10-CM | POA: Diagnosis not present

## 2018-11-08 DIAGNOSIS — J449 Chronic obstructive pulmonary disease, unspecified: Secondary | ICD-10-CM | POA: Diagnosis not present

## 2018-11-08 DIAGNOSIS — G479 Sleep disorder, unspecified: Secondary | ICD-10-CM | POA: Diagnosis not present

## 2018-11-08 NOTE — Progress Notes (Signed)
Chart and office note reviewed in detail  > agree with a/p as outlined    

## 2018-11-10 ENCOUNTER — Other Ambulatory Visit: Payer: Self-pay | Admitting: Family Medicine

## 2018-11-16 ENCOUNTER — Telehealth: Payer: Self-pay | Admitting: Internal Medicine

## 2018-11-16 DIAGNOSIS — J449 Chronic obstructive pulmonary disease, unspecified: Secondary | ICD-10-CM

## 2018-11-16 NOTE — Telephone Encounter (Signed)
Spoke with the pt  He is requesting new neb machine  [facility] Nothing further needed

## 2018-11-20 DIAGNOSIS — I5032 Chronic diastolic (congestive) heart failure: Secondary | ICD-10-CM | POA: Diagnosis not present

## 2018-11-20 DIAGNOSIS — I2783 Eisenmenger's syndrome: Secondary | ICD-10-CM | POA: Diagnosis not present

## 2018-11-20 DIAGNOSIS — G479 Sleep disorder, unspecified: Secondary | ICD-10-CM | POA: Diagnosis not present

## 2018-11-20 DIAGNOSIS — R0902 Hypoxemia: Secondary | ICD-10-CM | POA: Diagnosis not present

## 2018-11-20 DIAGNOSIS — J449 Chronic obstructive pulmonary disease, unspecified: Secondary | ICD-10-CM | POA: Diagnosis not present

## 2018-11-20 DIAGNOSIS — J189 Pneumonia, unspecified organism: Secondary | ICD-10-CM | POA: Diagnosis not present

## 2018-11-21 ENCOUNTER — Telehealth: Payer: Self-pay | Admitting: Internal Medicine

## 2018-11-21 MED ORDER — TIOTROPIUM BROMIDE MONOHYDRATE 2.5 MCG/ACT IN AERS: 2.0000 | INHALATION_SPRAY | Freq: Every day | RESPIRATORY_TRACT | 3 refills | Status: AC

## 2018-11-21 MED ORDER — ALBUTEROL SULFATE HFA 108 (90 BASE) MCG/ACT IN AERS: 2.0000 | INHALATION_SPRAY | Freq: Four times a day (QID) | RESPIRATORY_TRACT | 3 refills | Status: AC | PRN

## 2018-11-21 MED ORDER — ALBUTEROL SULFATE (2.5 MG/3ML) 0.083% IN NEBU: 2.5000 mg | INHALATION_SOLUTION | Freq: Four times a day (QID) | RESPIRATORY_TRACT | 5 refills | Status: AC | PRN

## 2018-11-21 MED ORDER — BUDESONIDE-FORMOTEROL FUMARATE 160-4.5 MCG/ACT IN AERO: INHALATION_SPRAY | RESPIRATORY_TRACT | 3 refills | Status: AC

## 2018-11-21 NOTE — Telephone Encounter (Signed)
Refill of all listed meds by pt has been sent to pt's preferred pharmacy. Called and spoke with pt letting him know this had been done. Pt expressed understanding. Nothing further needed.

## 2018-11-27 ENCOUNTER — Inpatient Hospital Stay (HOSPITAL_COMMUNITY): Payer: Medicare HMO

## 2018-11-27 ENCOUNTER — Encounter (HOSPITAL_COMMUNITY): Payer: Self-pay | Admitting: *Deleted

## 2018-11-27 ENCOUNTER — Emergency Department (HOSPITAL_COMMUNITY): Payer: Medicare HMO

## 2018-11-27 ENCOUNTER — Other Ambulatory Visit: Payer: Self-pay

## 2018-11-27 ENCOUNTER — Inpatient Hospital Stay (HOSPITAL_COMMUNITY)
Admission: EM | Admit: 2018-11-27 | Discharge: 2018-12-13 | DRG: 871 | Disposition: E | Payer: Medicare HMO | Attending: Pulmonary Disease | Admitting: Pulmonary Disease

## 2018-11-27 DIAGNOSIS — R4182 Altered mental status, unspecified: Secondary | ICD-10-CM | POA: Diagnosis present

## 2018-11-27 DIAGNOSIS — Z6841 Body Mass Index (BMI) 40.0 and over, adult: Secondary | ICD-10-CM

## 2018-11-27 DIAGNOSIS — N400 Enlarged prostate without lower urinary tract symptoms: Secondary | ICD-10-CM | POA: Diagnosis present

## 2018-11-27 DIAGNOSIS — I361 Nonrheumatic tricuspid (valve) insufficiency: Secondary | ICD-10-CM

## 2018-11-27 DIAGNOSIS — J9690 Respiratory failure, unspecified, unspecified whether with hypoxia or hypercapnia: Secondary | ICD-10-CM | POA: Diagnosis present

## 2018-11-27 DIAGNOSIS — E785 Hyperlipidemia, unspecified: Secondary | ICD-10-CM | POA: Diagnosis present

## 2018-11-27 DIAGNOSIS — I872 Venous insufficiency (chronic) (peripheral): Secondary | ICD-10-CM | POA: Diagnosis present

## 2018-11-27 DIAGNOSIS — R579 Shock, unspecified: Secondary | ICD-10-CM | POA: Diagnosis not present

## 2018-11-27 DIAGNOSIS — N19 Unspecified kidney failure: Secondary | ICD-10-CM

## 2018-11-27 DIAGNOSIS — Z79891 Long term (current) use of opiate analgesic: Secondary | ICD-10-CM

## 2018-11-27 DIAGNOSIS — R6521 Severe sepsis with septic shock: Secondary | ICD-10-CM | POA: Diagnosis present

## 2018-11-27 DIAGNOSIS — K436 Other and unspecified ventral hernia with obstruction, without gangrene: Secondary | ICD-10-CM | POA: Diagnosis present

## 2018-11-27 DIAGNOSIS — D649 Anemia, unspecified: Secondary | ICD-10-CM | POA: Diagnosis not present

## 2018-11-27 DIAGNOSIS — J151 Pneumonia due to Pseudomonas: Secondary | ICD-10-CM | POA: Diagnosis not present

## 2018-11-27 DIAGNOSIS — I429 Cardiomyopathy, unspecified: Secondary | ICD-10-CM | POA: Diagnosis present

## 2018-11-27 DIAGNOSIS — A419 Sepsis, unspecified organism: Secondary | ICD-10-CM | POA: Diagnosis not present

## 2018-11-27 DIAGNOSIS — I480 Paroxysmal atrial fibrillation: Secondary | ICD-10-CM

## 2018-11-27 DIAGNOSIS — K56609 Unspecified intestinal obstruction, unspecified as to partial versus complete obstruction: Secondary | ICD-10-CM

## 2018-11-27 DIAGNOSIS — R680 Hypothermia, not associated with low environmental temperature: Secondary | ICD-10-CM | POA: Diagnosis present

## 2018-11-27 DIAGNOSIS — I5043 Acute on chronic combined systolic (congestive) and diastolic (congestive) heart failure: Secondary | ICD-10-CM | POA: Diagnosis present

## 2018-11-27 DIAGNOSIS — J9601 Acute respiratory failure with hypoxia: Secondary | ICD-10-CM | POA: Diagnosis not present

## 2018-11-27 DIAGNOSIS — E875 Hyperkalemia: Secondary | ICD-10-CM | POA: Diagnosis present

## 2018-11-27 DIAGNOSIS — G9340 Encephalopathy, unspecified: Secondary | ICD-10-CM | POA: Diagnosis present

## 2018-11-27 DIAGNOSIS — Z880 Allergy status to penicillin: Secondary | ICD-10-CM

## 2018-11-27 DIAGNOSIS — R069 Unspecified abnormalities of breathing: Secondary | ICD-10-CM

## 2018-11-27 DIAGNOSIS — R109 Unspecified abdominal pain: Secondary | ICD-10-CM

## 2018-11-27 DIAGNOSIS — E874 Mixed disorder of acid-base balance: Secondary | ICD-10-CM | POA: Diagnosis present

## 2018-11-27 DIAGNOSIS — Z7951 Long term (current) use of inhaled steroids: Secondary | ICD-10-CM

## 2018-11-27 DIAGNOSIS — N170 Acute kidney failure with tubular necrosis: Secondary | ICD-10-CM | POA: Diagnosis present

## 2018-11-27 DIAGNOSIS — R9431 Abnormal electrocardiogram [ECG] [EKG]: Secondary | ICD-10-CM | POA: Diagnosis not present

## 2018-11-27 DIAGNOSIS — Z808 Family history of malignant neoplasm of other organs or systems: Secondary | ICD-10-CM

## 2018-11-27 DIAGNOSIS — Z833 Family history of diabetes mellitus: Secondary | ICD-10-CM

## 2018-11-27 DIAGNOSIS — E161 Other hypoglycemia: Secondary | ICD-10-CM | POA: Diagnosis not present

## 2018-11-27 DIAGNOSIS — J9811 Atelectasis: Secondary | ICD-10-CM | POA: Diagnosis present

## 2018-11-27 DIAGNOSIS — I4892 Unspecified atrial flutter: Secondary | ICD-10-CM | POA: Diagnosis present

## 2018-11-27 DIAGNOSIS — R1010 Upper abdominal pain, unspecified: Secondary | ICD-10-CM | POA: Diagnosis not present

## 2018-11-27 DIAGNOSIS — R404 Transient alteration of awareness: Secondary | ICD-10-CM | POA: Diagnosis not present

## 2018-11-27 DIAGNOSIS — K7200 Acute and subacute hepatic failure without coma: Secondary | ICD-10-CM | POA: Diagnosis not present

## 2018-11-27 DIAGNOSIS — I110 Hypertensive heart disease with heart failure: Secondary | ICD-10-CM | POA: Diagnosis present

## 2018-11-27 DIAGNOSIS — I10 Essential (primary) hypertension: Secondary | ICD-10-CM | POA: Diagnosis not present

## 2018-11-27 DIAGNOSIS — J9622 Acute and chronic respiratory failure with hypercapnia: Secondary | ICD-10-CM | POA: Diagnosis present

## 2018-11-27 DIAGNOSIS — E162 Hypoglycemia, unspecified: Secondary | ICD-10-CM | POA: Diagnosis present

## 2018-11-27 DIAGNOSIS — N179 Acute kidney failure, unspecified: Secondary | ICD-10-CM

## 2018-11-27 DIAGNOSIS — J9602 Acute respiratory failure with hypercapnia: Secondary | ICD-10-CM | POA: Diagnosis not present

## 2018-11-27 DIAGNOSIS — Z4682 Encounter for fitting and adjustment of non-vascular catheter: Secondary | ICD-10-CM

## 2018-11-27 DIAGNOSIS — E039 Hypothyroidism, unspecified: Secondary | ICD-10-CM | POA: Diagnosis present

## 2018-11-27 DIAGNOSIS — Z96651 Presence of right artificial knee joint: Secondary | ICD-10-CM | POA: Diagnosis present

## 2018-11-27 DIAGNOSIS — I5041 Acute combined systolic (congestive) and diastolic (congestive) heart failure: Secondary | ICD-10-CM

## 2018-11-27 DIAGNOSIS — Z800 Family history of malignant neoplasm of digestive organs: Secondary | ICD-10-CM

## 2018-11-27 DIAGNOSIS — J440 Chronic obstructive pulmonary disease with acute lower respiratory infection: Secondary | ICD-10-CM | POA: Diagnosis present

## 2018-11-27 DIAGNOSIS — Z9981 Dependence on supplemental oxygen: Secondary | ICD-10-CM

## 2018-11-27 DIAGNOSIS — E440 Moderate protein-calorie malnutrition: Secondary | ICD-10-CM | POA: Diagnosis present

## 2018-11-27 DIAGNOSIS — J8410 Pulmonary fibrosis, unspecified: Secondary | ICD-10-CM | POA: Diagnosis not present

## 2018-11-27 DIAGNOSIS — Z79899 Other long term (current) drug therapy: Secondary | ICD-10-CM

## 2018-11-27 DIAGNOSIS — G4733 Obstructive sleep apnea (adult) (pediatric): Secondary | ICD-10-CM | POA: Diagnosis present

## 2018-11-27 DIAGNOSIS — Z7952 Long term (current) use of systemic steroids: Secondary | ICD-10-CM

## 2018-11-27 DIAGNOSIS — F419 Anxiety disorder, unspecified: Secondary | ICD-10-CM | POA: Diagnosis present

## 2018-11-27 DIAGNOSIS — R4020 Unspecified coma: Secondary | ICD-10-CM | POA: Diagnosis not present

## 2018-11-27 DIAGNOSIS — Z8249 Family history of ischemic heart disease and other diseases of the circulatory system: Secondary | ICD-10-CM

## 2018-11-27 DIAGNOSIS — J9809 Other diseases of bronchus, not elsewhere classified: Secondary | ICD-10-CM | POA: Diagnosis not present

## 2018-11-27 DIAGNOSIS — M1990 Unspecified osteoarthritis, unspecified site: Secondary | ICD-10-CM | POA: Diagnosis present

## 2018-11-27 DIAGNOSIS — R0902 Hypoxemia: Secondary | ICD-10-CM | POA: Diagnosis not present

## 2018-11-27 DIAGNOSIS — Z79890 Hormone replacement therapy (postmenopausal): Secondary | ICD-10-CM

## 2018-11-27 DIAGNOSIS — R918 Other nonspecific abnormal finding of lung field: Secondary | ICD-10-CM | POA: Diagnosis not present

## 2018-11-27 DIAGNOSIS — Z515 Encounter for palliative care: Secondary | ICD-10-CM | POA: Diagnosis not present

## 2018-11-27 DIAGNOSIS — Z87891 Personal history of nicotine dependence: Secondary | ICD-10-CM

## 2018-11-27 DIAGNOSIS — Z0189 Encounter for other specified special examinations: Secondary | ICD-10-CM

## 2018-11-27 DIAGNOSIS — E861 Hypovolemia: Secondary | ICD-10-CM | POA: Diagnosis present

## 2018-11-27 DIAGNOSIS — K5669 Other partial intestinal obstruction: Secondary | ICD-10-CM | POA: Diagnosis not present

## 2018-11-27 DIAGNOSIS — J9600 Acute respiratory failure, unspecified whether with hypoxia or hypercapnia: Secondary | ICD-10-CM

## 2018-11-27 DIAGNOSIS — J9621 Acute and chronic respiratory failure with hypoxia: Secondary | ICD-10-CM | POA: Diagnosis present

## 2018-11-27 LAB — POCT I-STAT 3, ART BLOOD GAS (G3+)
Acid-Base Excess: 4 mmol/L — ABNORMAL HIGH (ref 0.0–2.0)
Bicarbonate: 34.2 mmol/L — ABNORMAL HIGH (ref 20.0–28.0)
Bicarbonate: 28.9 mmol/L — ABNORMAL HIGH (ref 20.0–28.0)
O2 Saturation: 100 %
O2 Saturation: 89 %
PCO2 ART: 76.1 mmHg — AB (ref 32.0–48.0)
Patient temperature: 96.2
Patient temperature: 97.2
TCO2: 37 mmol/L — ABNORMAL HIGH (ref 22–32)
TCO2: 31 mmol/L (ref 22–32)
pCO2 arterial: 62.8 mmHg — ABNORMAL HIGH (ref 32.0–48.0)
pH, Arterial: 7.253 — ABNORMAL LOW (ref 7.350–7.450)
pH, Arterial: 7.267 — ABNORMAL LOW (ref 7.350–7.450)
pO2, Arterial: 286 mmHg — ABNORMAL HIGH (ref 83.0–108.0)
pO2, Arterial: 63 mmHg — ABNORMAL LOW (ref 83.0–108.0)

## 2018-11-27 LAB — GLUCOSE, CAPILLARY
Glucose-Capillary: 165 mg/dL — ABNORMAL HIGH (ref 70–99)
Glucose-Capillary: 83 mg/dL (ref 70–99)
Glucose-Capillary: 127 mg/dL — ABNORMAL HIGH (ref 70–99)
Glucose-Capillary: 152 mg/dL — ABNORMAL HIGH (ref 70–99)
Glucose-Capillary: 208 mg/dL — ABNORMAL HIGH (ref 70–99)
Glucose-Capillary: 180 mg/dL — ABNORMAL HIGH (ref 70–99)

## 2018-11-27 LAB — URINALYSIS, ROUTINE W REFLEX MICROSCOPIC
Bilirubin Urine: NEGATIVE
Glucose, UA: NEGATIVE mg/dL
Ketones, ur: 5 mg/dL — AB
Leukocytes, UA: NEGATIVE
Nitrite: NEGATIVE
Protein, ur: 100 mg/dL — AB
Specific Gravity, Urine: 1.023 (ref 1.005–1.030)
pH: 5 (ref 5.0–8.0)

## 2018-11-27 LAB — I-STAT ARTERIAL BLOOD GAS, ED
Acid-base deficit: 9 mmol/L — ABNORMAL HIGH (ref 0.0–2.0)
Bicarbonate: 22.9 mmol/L (ref 20.0–28.0)
O2 Saturation: 99 %
Patient temperature: 98.6
TCO2: 25 mmol/L (ref 22–32)
pCO2 arterial: 81.6 mmHg (ref 32.0–48.0)
pH, Arterial: 7.056 — CL (ref 7.350–7.450)
pO2, Arterial: 198 mmHg — ABNORMAL HIGH (ref 83.0–108.0)

## 2018-11-27 LAB — CBC WITH DIFFERENTIAL/PLATELET
Abs Immature Granulocytes: 0.45 10*3/uL — ABNORMAL HIGH (ref 0.00–0.07)
Basophils Absolute: 0.1 10*3/uL (ref 0.0–0.1)
Basophils Relative: 0 %
Eosinophils Absolute: 0 10*3/uL (ref 0.0–0.5)
Eosinophils Relative: 0 %
HCT: 38.4 % — ABNORMAL LOW (ref 39.0–52.0)
Hemoglobin: 10.4 g/dL — ABNORMAL LOW (ref 13.0–17.0)
Immature Granulocytes: 3 %
Lymphocytes Relative: 16 %
Lymphs Abs: 2.1 10*3/uL (ref 0.7–4.0)
MCH: 25.7 pg — AB (ref 26.0–34.0)
MCHC: 27.1 g/dL — ABNORMAL LOW (ref 30.0–36.0)
MCV: 94.8 fL (ref 80.0–100.0)
Monocytes Absolute: 0.8 10*3/uL (ref 0.1–1.0)
Monocytes Relative: 6 %
NEUTROS PCT: 75 %
Neutro Abs: 10.2 10*3/uL — ABNORMAL HIGH (ref 1.7–7.7)
Platelets: 245 10*3/uL (ref 150–400)
RBC: 4.05 MIL/uL — AB (ref 4.22–5.81)
RDW: 14.9 % (ref 11.5–15.5)
WBC: 13.7 10*3/uL — AB (ref 4.0–10.5)
nRBC: 1.9 % — ABNORMAL HIGH (ref 0.0–0.2)

## 2018-11-27 LAB — COOXEMETRY PANEL
Carboxyhemoglobin: 1.1 % (ref 0.5–1.5)
Carboxyhemoglobin: 1 % (ref 0.5–1.5)
Methemoglobin: 1 % (ref 0.0–1.5)
Methemoglobin: 1.8 % — ABNORMAL HIGH (ref 0.0–1.5)
O2 Saturation: 66.4 %
O2 Saturation: 62.2 %
Total hemoglobin: 14.7 g/dL (ref 12.0–16.0)
Total hemoglobin: 10.9 g/dL — ABNORMAL LOW (ref 12.0–16.0)

## 2018-11-27 LAB — COMPREHENSIVE METABOLIC PANEL
ALK PHOS: 90 U/L (ref 38–126)
ALT: 360 U/L — ABNORMAL HIGH (ref 0–44)
ALT: 1837 U/L — ABNORMAL HIGH (ref 0–44)
ANION GAP: 29 — AB (ref 5–15)
AST: 304 U/L — ABNORMAL HIGH (ref 15–41)
AST: 1606 U/L — ABNORMAL HIGH (ref 15–41)
Albumin: 2.1 g/dL — ABNORMAL LOW (ref 3.5–5.0)
Albumin: 2 g/dL — ABNORMAL LOW (ref 3.5–5.0)
Alkaline Phosphatase: 73 U/L (ref 38–126)
Anion gap: 17 — ABNORMAL HIGH (ref 5–15)
BUN: 57 mg/dL — ABNORMAL HIGH (ref 8–23)
BUN: 56 mg/dL — AB (ref 8–23)
CO2: 19 mmol/L — ABNORMAL LOW (ref 22–32)
CO2: 25 mmol/L (ref 22–32)
Calcium: 7.3 mg/dL — ABNORMAL LOW (ref 8.9–10.3)
Calcium: 7.1 mg/dL — ABNORMAL LOW (ref 8.9–10.3)
Chloride: 90 mmol/L — ABNORMAL LOW (ref 98–111)
Chloride: 96 mmol/L — ABNORMAL LOW (ref 98–111)
Creatinine, Ser: 5.53 mg/dL — ABNORMAL HIGH (ref 0.61–1.24)
Creatinine, Ser: 4.3 mg/dL — ABNORMAL HIGH (ref 0.61–1.24)
GFR calc Af Amer: 11 mL/min — ABNORMAL LOW (ref >60)
GFR calc Af Amer: 16 mL/min — ABNORMAL LOW (ref >60)
GFR calc non Af Amer: 13 mL/min — ABNORMAL LOW (ref >60)
GFR, EST NON AFRICAN AMERICAN: 10 mL/min — AB (ref >60)
Glucose, Bld: 201 mg/dL — ABNORMAL HIGH (ref 70–99)
Glucose, Bld: 239 mg/dL — ABNORMAL HIGH (ref 70–99)
POTASSIUM: 4.3 mmol/L (ref 3.5–5.1)
Potassium: 4.7 mmol/L (ref 3.5–5.1)
Sodium: 138 mmol/L (ref 135–145)
Sodium: 138 mmol/L (ref 135–145)
Total Bilirubin: 0.8 mg/dL (ref 0.3–1.2)
Total Bilirubin: 1.1 mg/dL (ref 0.3–1.2)
Total Protein: 5.1 g/dL — ABNORMAL LOW (ref 6.5–8.1)
Total Protein: 4.8 g/dL — ABNORMAL LOW (ref 6.5–8.1)

## 2018-11-27 LAB — APTT: APTT: 65 s — AB (ref 24–36)

## 2018-11-27 LAB — I-STAT CHEM 8, ED
BUN: 72 mg/dL — ABNORMAL HIGH (ref 8–23)
Calcium, Ion: 0.91 mmol/L — ABNORMAL LOW (ref 1.15–1.40)
Chloride: 94 mmol/L — ABNORMAL LOW (ref 98–111)
Creatinine, Ser: 5.6 mg/dL — ABNORMAL HIGH (ref 0.61–1.24)
Glucose, Bld: 44 mg/dL — CL (ref 70–99)
HCT: 46 % (ref 39.0–52.0)
Hemoglobin: 15.6 g/dL (ref 13.0–17.0)
Potassium: 6 mmol/L — ABNORMAL HIGH (ref 3.5–5.1)
SODIUM: 131 mmol/L — AB (ref 135–145)
TCO2: 23 mmol/L (ref 22–32)

## 2018-11-27 LAB — I-STAT CG4 LACTIC ACID, ED: Lactic Acid, Venous: 16.12 mmol/L (ref 0.5–1.9)

## 2018-11-27 LAB — PHOSPHORUS
Phosphorus: 12 mg/dL — ABNORMAL HIGH (ref 2.5–4.6)
Phosphorus: 6.8 mg/dL — ABNORMAL HIGH (ref 2.5–4.6)

## 2018-11-27 LAB — TROPONIN I
Troponin I: 0.05 ng/mL (ref <0.03)
Troponin I: 0.05 ng/mL (ref <0.03)
Troponin I: 0.11 ng/mL (ref <0.03)

## 2018-11-27 LAB — PROTIME-INR
INR: 1.86
Prothrombin Time: 21.2 seconds — ABNORMAL HIGH (ref 11.4–15.2)

## 2018-11-27 LAB — MRSA PCR SCREENING: MRSA by PCR: NEGATIVE

## 2018-11-27 LAB — LACTIC ACID, PLASMA: Lactic Acid, Venous: 4.5 mmol/L (ref 0.5–1.9)

## 2018-11-27 LAB — LIPASE, BLOOD: Lipase: 25 U/L (ref 11–51)

## 2018-11-27 LAB — PROCALCITONIN: Procalcitonin: 23.13 ng/mL

## 2018-11-27 LAB — BRAIN NATRIURETIC PEPTIDE: B Natriuretic Peptide: 25.5 pg/mL (ref 0.0–100.0)

## 2018-11-27 LAB — MAGNESIUM: Magnesium: 2.5 mg/dL — ABNORMAL HIGH (ref 1.7–2.4)

## 2018-11-27 LAB — CK: Total CK: 990 U/L — ABNORMAL HIGH (ref 49–397)

## 2018-11-27 LAB — ECHOCARDIOGRAM COMPLETE: Weight: 4232.83 oz

## 2018-11-27 LAB — INFLUENZA PANEL BY PCR (TYPE A & B)
INFLAPCR: NEGATIVE
Influenza B By PCR: NEGATIVE

## 2018-11-27 LAB — TSH: TSH: 0.581 u[IU]/mL (ref 0.350–4.500)

## 2018-11-27 LAB — CORTISOL: Cortisol, Plasma: 24.1 ug/dL

## 2018-11-27 MED ORDER — SODIUM BICARBONATE 8.4 % IV SOLN
50.0000 meq | Freq: Once | INTRAVENOUS | Status: AC
  Administered 2018-11-27: 50 meq via INTRAVENOUS

## 2018-11-27 MED ORDER — IPRATROPIUM-ALBUTEROL 0.5-2.5 (3) MG/3ML IN SOLN: 3.0000 mL | RESPIRATORY_TRACT | Status: DC | PRN

## 2018-11-27 MED ORDER — SODIUM BICARBONATE 8.4 % IV SOLN
INTRAVENOUS | Status: DC | PRN
  Administered 2018-11-27: 100 meq via INTRAVENOUS

## 2018-11-27 MED ORDER — MIDAZOLAM HCL 2 MG/2ML IJ SOLN
1.0000 mg | INTRAMUSCULAR | Status: DC | PRN
  Administered 2018-11-27 – 2018-11-30 (×7): 1 mg via INTRAVENOUS
  Filled 2018-11-27 (×9): qty 2

## 2018-11-27 MED ORDER — METHYLPREDNISOLONE SODIUM SUCC 40 MG IJ SOLR: 40.0000 mg | Freq: Every day | INTRAMUSCULAR | Status: DC

## 2018-11-27 MED ORDER — NOREPINEPHRINE 4 MG/250ML-% IV SOLN
INTRAVENOUS | Status: AC
  Filled 2018-11-27: qty 250

## 2018-11-27 MED ORDER — PHENYLEPHRINE 40 MCG/ML (10ML) SYRINGE FOR IV PUSH (FOR BLOOD PRESSURE SUPPORT)
PREFILLED_SYRINGE | INTRAVENOUS | Status: AC
  Filled 2018-11-27: qty 10

## 2018-11-27 MED ORDER — SODIUM CHLORIDE 0.9 % IV SOLN
1.0000 g | Freq: Three times a day (TID) | INTRAVENOUS | Status: DC
  Administered 2018-11-27 – 2018-11-28 (×4): 1 g via INTRAVENOUS
  Filled 2018-11-27 (×6): qty 1

## 2018-11-27 MED ORDER — VANCOMYCIN HCL 10 G IV SOLR
2000.0000 mg | Freq: Once | INTRAVENOUS | Status: AC
  Administered 2018-11-27: 2000 mg via INTRAVENOUS
  Filled 2018-11-27: qty 2000

## 2018-11-27 MED ORDER — METRONIDAZOLE IN NACL 5-0.79 MG/ML-% IV SOLN
500.0000 mg | Freq: Three times a day (TID) | INTRAVENOUS | Status: DC
  Administered 2018-11-27 – 2018-11-30 (×10): 500 mg via INTRAVENOUS
  Filled 2018-11-27 (×10): qty 100

## 2018-11-27 MED ORDER — HEPARIN SODIUM (PORCINE) 5000 UNIT/ML IJ SOLN: 5000.0000 [IU] | Freq: Three times a day (TID) | INTRAMUSCULAR | Status: DC

## 2018-11-27 MED ORDER — ROCURONIUM BROMIDE 50 MG/5ML IV SOLN
INTRAVENOUS | Status: AC | PRN
  Administered 2018-11-27: 100 mg via INTRAVENOUS

## 2018-11-27 MED ORDER — LACTATED RINGERS IV BOLUS (SEPSIS): 500.0000 mL | Freq: Once | INTRAVENOUS | Status: DC

## 2018-11-27 MED ORDER — ORAL CARE MOUTH RINSE
15.0000 mL | OROMUCOSAL | Status: DC
  Administered 2018-11-27 – 2018-11-30 (×33): 15 mL via OROMUCOSAL

## 2018-11-27 MED ORDER — EPINEPHRINE PF 1 MG/ML IJ SOLN
3.0000 ug/min | INTRAVENOUS | Status: DC
  Administered 2018-11-27: 3 ug/min via INTRAVENOUS
  Filled 2018-11-27: qty 4

## 2018-11-27 MED ORDER — FENTANYL BOLUS VIA INFUSION
25.0000 ug | INTRAVENOUS | Status: DC | PRN
  Administered 2018-11-30 (×4): 25 ug via INTRAVENOUS
  Filled 2018-11-27: qty 25

## 2018-11-27 MED ORDER — PHENYLEPHRINE HCL 10 MG/ML IJ SOLN
INTRAMUSCULAR | Status: AC | PRN
  Administered 2018-11-27: 100 ug

## 2018-11-27 MED ORDER — PANTOPRAZOLE SODIUM 40 MG IV SOLR
40.0000 mg | Freq: Two times a day (BID) | INTRAVENOUS | Status: DC
  Administered 2018-11-27 – 2018-11-30 (×6): 40 mg via INTRAVENOUS
  Filled 2018-11-27 (×6): qty 40

## 2018-11-27 MED ORDER — PERFLUTREN LIPID MICROSPHERE
1.0000 mL | INTRAVENOUS | Status: AC | PRN
  Administered 2018-11-27: 3 mL via INTRAVENOUS
  Filled 2018-11-27: qty 10

## 2018-11-27 MED ORDER — KETAMINE HCL 50 MG/5ML IJ SOSY
PREFILLED_SYRINGE | INTRAMUSCULAR | Status: AC
  Filled 2018-11-27: qty 5

## 2018-11-27 MED ORDER — FENTANYL CITRATE (PF) 100 MCG/2ML IJ SOLN
50.0000 ug | Freq: Once | INTRAMUSCULAR | Status: AC
  Administered 2018-11-27: 50 ug via INTRAVENOUS

## 2018-11-27 MED ORDER — ALBUTEROL SULFATE (2.5 MG/3ML) 0.083% IN NEBU: 2.5000 mg | INHALATION_SOLUTION | RESPIRATORY_TRACT | Status: DC | PRN

## 2018-11-27 MED ORDER — SODIUM CHLORIDE 0.9 % IV SOLN
Freq: Once | INTRAVENOUS | Status: AC
  Administered 2018-11-27: 07:00:00 via INTRAVENOUS

## 2018-11-27 MED ORDER — DOCUSATE SODIUM 50 MG/5ML PO LIQD
100.0000 mg | Freq: Two times a day (BID) | ORAL | Status: DC | PRN
  Administered 2018-11-30: 100 mg
  Filled 2018-11-27 (×2): qty 10

## 2018-11-27 MED ORDER — METHYLPREDNISOLONE SODIUM SUCC 40 MG IJ SOLR: 40.0000 mg | Freq: Two times a day (BID) | INTRAMUSCULAR | Status: DC

## 2018-11-27 MED ORDER — FAMOTIDINE IN NACL 20-0.9 MG/50ML-% IV SOLN
20.0000 mg | INTRAVENOUS | Status: DC
  Administered 2018-11-28 – 2018-11-30 (×3): 20 mg via INTRAVENOUS
  Filled 2018-11-27 (×3): qty 50

## 2018-11-27 MED ORDER — ALBUTEROL (5 MG/ML) CONTINUOUS INHALATION SOLN
10.0000 mg/h | INHALATION_SOLUTION | Freq: Once | RESPIRATORY_TRACT | Status: AC
  Administered 2018-11-27: 10 mg/h via RESPIRATORY_TRACT
  Filled 2018-11-27: qty 20

## 2018-11-27 MED ORDER — ARFORMOTEROL TARTRATE 15 MCG/2ML IN NEBU
15.0000 ug | INHALATION_SOLUTION | Freq: Two times a day (BID) | RESPIRATORY_TRACT | Status: DC
  Administered 2018-11-27 – 2018-11-30 (×7): 15 ug via RESPIRATORY_TRACT
  Filled 2018-11-27 (×8): qty 2

## 2018-11-27 MED ORDER — HYDROCORTISONE NA SUCCINATE PF 100 MG IJ SOLR
50.0000 mg | Freq: Four times a day (QID) | INTRAMUSCULAR | Status: DC
  Administered 2018-11-27 – 2018-11-30 (×14): 50 mg via INTRAVENOUS
  Filled 2018-11-27 (×14): qty 2

## 2018-11-27 MED ORDER — KETAMINE HCL 10 MG/ML IJ SOLN
INTRAMUSCULAR | Status: AC | PRN
  Administered 2018-11-27: 125 mg via INTRAVENOUS

## 2018-11-27 MED ORDER — PHENYLEPHRINE HCL-NACL 10-0.9 MG/250ML-% IV SOLN
0.0000 ug/min | INTRAVENOUS | Status: DC
  Administered 2018-11-27: 200 ug/min via INTRAVENOUS
  Administered 2018-11-27: 150 ug/min via INTRAVENOUS
  Administered 2018-11-27: 20 ug/min via INTRAVENOUS
  Administered 2018-11-27: 200 ug/min via INTRAVENOUS
  Filled 2018-11-27 (×5): qty 250

## 2018-11-27 MED ORDER — DEXTROSE 50 % IV SOLN
1.0000 | Freq: Once | INTRAVENOUS | Status: AC
  Administered 2018-11-27: 50 mL via INTRAVENOUS

## 2018-11-27 MED ORDER — FENTANYL 2500MCG IN NS 250ML (10MCG/ML) PREMIX INFUSION
25.0000 ug/h | INTRAVENOUS | Status: DC
  Administered 2018-11-27: 25 ug/h via INTRAVENOUS
  Administered 2018-11-28: 50 ug/h via INTRAVENOUS
  Administered 2018-11-29 – 2018-11-30 (×2): 150 ug/h via INTRAVENOUS
  Administered 2018-11-30: 200 ug/h via INTRAVENOUS
  Filled 2018-11-27 (×6): qty 250

## 2018-11-27 MED ORDER — SODIUM BICARBONATE 8.4 % IV SOLN
INTRAVENOUS | Status: AC
  Filled 2018-11-27: qty 50

## 2018-11-27 MED ORDER — VASOPRESSIN 20 UNIT/ML IV SOLN
0.0300 [IU]/min | INTRAVENOUS | Status: DC
  Administered 2018-11-27 – 2018-11-30 (×4): 0.03 [IU]/min via INTRAVENOUS
  Filled 2018-11-27 (×4): qty 2

## 2018-11-27 MED ORDER — PHENYLEPHRINE HCL-NACL 40-0.9 MG/250ML-% IV SOLN
0.0000 ug/min | INTRAVENOUS | Status: DC
  Administered 2018-11-27: 200 ug/min via INTRAVENOUS
  Administered 2018-11-27 (×2): 150 ug/min via INTRAVENOUS
  Administered 2018-11-28: 220 ug/min via INTRAVENOUS
  Administered 2018-11-28: 200 ug/min via INTRAVENOUS
  Administered 2018-11-28 (×2): 220 ug/min via INTRAVENOUS
  Administered 2018-11-30: 80 ug/min via INTRAVENOUS
  Administered 2018-11-30: 230 ug/min via INTRAVENOUS
  Administered 2018-11-30: 250 ug/min via INTRAVENOUS
  Filled 2018-11-27 (×4): qty 250
  Filled 2018-11-27: qty 40
  Filled 2018-11-27 (×11): qty 250

## 2018-11-27 MED ORDER — CHLORHEXIDINE GLUCONATE 0.12% ORAL RINSE (MEDLINE KIT)
15.0000 mL | Freq: Two times a day (BID) | OROMUCOSAL | Status: DC
  Administered 2018-11-27 – 2018-11-30 (×6): 15 mL via OROMUCOSAL

## 2018-11-27 MED ORDER — LACTATED RINGERS IV BOLUS (SEPSIS)
1000.0000 mL | Freq: Once | INTRAVENOUS | Status: DC
  Administered 2018-11-27: 1000 mL via INTRAVENOUS

## 2018-11-27 MED ORDER — BISACODYL 10 MG RE SUPP: 10.0000 mg | Freq: Every day | RECTAL | Status: DC | PRN

## 2018-11-27 MED ORDER — CALCIUM CHLORIDE 10 % IV SOLN
INTRAVENOUS | Status: AC | PRN
  Administered 2018-11-27: 1 g via INTRAVENOUS

## 2018-11-27 MED ORDER — LACTATED RINGERS IV BOLUS (SEPSIS)
1000.0000 mL | Freq: Once | INTRAVENOUS | Status: AC
  Administered 2018-11-27: 1000 mL via INTRAVENOUS

## 2018-11-27 MED ORDER — SODIUM BICARBONATE 8.4 % IV SOLN
INTRAVENOUS | Status: AC | PRN
  Administered 2018-11-27: 50 meq via INTRAVENOUS

## 2018-11-27 MED ORDER — METHYLPREDNISOLONE SODIUM SUCC 125 MG IJ SOLR
125.0000 mg | Freq: Once | INTRAMUSCULAR | Status: AC
  Administered 2018-11-27: 125 mg via INTRAVENOUS
  Filled 2018-11-27: qty 2

## 2018-11-27 MED ORDER — VANCOMYCIN HCL 10 G IV SOLR: 1500.0000 mg | INTRAVENOUS | Status: DC

## 2018-11-27 MED ORDER — IPRATROPIUM BROMIDE 0.02 % IN SOLN
0.5000 mg | Freq: Once | RESPIRATORY_TRACT | Status: AC
  Administered 2018-11-27: 0.5 mg via RESPIRATORY_TRACT
  Filled 2018-11-27: qty 2.5

## 2018-11-27 MED ORDER — BUDESONIDE 0.5 MG/2ML IN SUSP
0.5000 mg | Freq: Two times a day (BID) | RESPIRATORY_TRACT | Status: DC
  Administered 2018-11-27 – 2018-11-30 (×7): 0.5 mg via RESPIRATORY_TRACT
  Filled 2018-11-27 (×8): qty 2

## 2018-11-27 MED ORDER — NOREPINEPHRINE 4 MG/250ML-% IV SOLN
0.0000 ug/min | INTRAVENOUS | Status: DC
  Administered 2018-11-27: 40 ug/min via INTRAVENOUS
  Administered 2018-11-27: 5.333 ug/min via INTRAVENOUS
  Administered 2018-11-27: 40 ug/min via INTRAVENOUS
  Filled 2018-11-27 (×3): qty 250

## 2018-11-27 MED ORDER — MIDAZOLAM HCL 2 MG/2ML IJ SOLN
1.0000 mg | INTRAMUSCULAR | Status: DC | PRN
  Administered 2018-11-30: 1 mg via INTRAVENOUS

## 2018-11-27 MED ORDER — NOREPINEPHRINE 16 MG/250ML-% IV SOLN
0.0000 ug/min | INTRAVENOUS | Status: DC
  Administered 2018-11-27: 40 ug/min via INTRAVENOUS
  Administered 2018-11-28 (×2): 30 ug/min via INTRAVENOUS
  Administered 2018-11-28 – 2018-11-29 (×2): 35 ug/min via INTRAVENOUS
  Administered 2018-11-29: 32 ug/min via INTRAVENOUS
  Administered 2018-11-30: 30 ug/min via INTRAVENOUS
  Filled 2018-11-27 (×7): qty 250

## 2018-11-27 MED ORDER — LACTATED RINGERS IV BOLUS
1000.0000 mL | Freq: Once | INTRAVENOUS | Status: AC
  Administered 2018-11-27: 1000 mL via INTRAVENOUS

## 2018-11-27 MED FILL — Medication: Qty: 1 | Status: AC

## 2018-11-27 NOTE — Progress Notes (Signed)
   12/06/2018 1300  Clinical Encounter Type  Visited With Patient and family together  Visit Type Initial  Referral From Nurse  Consult/Referral To Chaplain  Spiritual Encounters  Spiritual Needs Prayer;Emotional  Stress Factors  Patient Stress Factors None identified  Family Stress Factors Health changes;Major life changes   Responded to page from Nurse. She stated that family was in need of spiritual support from the recent news received about  PT. Upon entering, PT was unresponsive and family was at bedside. I offered spiritual support with words of encouragement, ministry of presence, a listening ear, and prayer. Family was very thankful for the Chaplain visit. Chaplain available as needed.  Chaplain Orest DikesAbel Jelisha Weed [phone]

## 2018-11-27 NOTE — ED Notes (Signed)
Family at bedside. 

## 2018-11-27 NOTE — Progress Notes (Signed)
Pharmacy Antibiotic Note  Samuel CaoMichael J Rottinghaus Sr. is a 67 y.o. male admitted on Feb 13, 2018 unresponsive. Has been nauseous and vomiting. Required intubation in the ED. AKI on admit, Baseline SCr 0.7 > now up to 5.5, Phos 12, Mg 2.5, LA 16. Abx for sepsis.   Plan: -Vancomycin 2 g IV x1 then 1500 mg IV q48h -Aztreonam 1 g IV q8h -Flagyl 500 mg IV q8h -Monitor renal fx, culturs, VR as needed   Weight: 264 lb 8.8 oz (120 kg)  Temp (24hrs), Avg:95.2 F (35.1 C), Min:95.2 F (35.1 C), Max:95.2 F (35.1 C)  Recent Labs  Lab Jun 22, 2018 0549 Jun 22, 2018 0615  WBC  --  PENDING  CREATININE 5.60* 5.53*  LATICACIDVEN 16.12*  --     Estimated Creatinine Clearance: 15.3 mL/min (A) (by C-G formula based on SCr of 5.53 mg/dL (H)).    Allergies  Allergen Reactions  . Penicillins Swelling and Rash    Has patient had a PCN reaction causing immediate rash, facial/tongue/throat swelling, SOB or lightheadedness with hypotension: Yes Has patient had a PCN reaction causing severe rash involving mucus membranes or skin necrosis: Yes Has patient had a PCN reaction that required hospitalization: No Has patient had a PCN reaction occurring within the last 10 years: No If all of the above answers are "NO", then may proceed with Cephalosporin use.     Antimicrobials this admission: 12/16 vancomycin > 12/16 aztrenam > 12/16 flagyl >  Dose adjustments this admission: N/A   Microbiology results: 12/16 blood cx: 12/16 urine cx:   Baldemar FridayMasters, Teonia Yager M Feb 13, 2018 8:36 AM

## 2018-11-27 NOTE — CV Procedure (Signed)
Echocardiogram not completed because the patient is moving from the ED to [facility].   Leta Junglingiffany Ayanna Gheen RDCS
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
## 2018-11-27 NOTE — ED Notes (Signed)
MD at bedside preparing for intubation; pt now speaking in full sentences, able to recall some events from events tonight. RT setting up bipap
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
## 2018-11-27 NOTE — Procedures (Signed)
Central Venous Catheter Insertion Procedure Note Samuel CaoMichael J Tiede Sr. [id number] 20-Sep-1951  Procedure: Insertion of Central Venous Catheter Indications: Assessment of intravascular volume, Drug and/or fluid administration and Frequent blood sampling  Procedure Details Consent: Risks of procedure as well as the alternatives and risks of each were explained to the (patient/caregiver).  Consent for procedure obtained. Time Out: Verified patient identification, verified procedure, site/side was marked, verified correct patient position, special equipment/implants available, medications/allergies/relevent history reviewed, required imaging and test results available.  Performed  Maximum sterile technique was used including antiseptics, cap, gloves, gown, hand hygiene, mask and sheet. Skin prep: Chlorhexidine; local anesthetic administered A antimicrobial bonded/coated triple lumen catheter was placed in the left internal jugular vein using the Seldinger technique.  Evaluation Blood flow good Complications: No apparent complications Patient did tolerate procedure well. Chest X-ray ordered to verify placement.  CXR: pending.  Procedure performed with ultrasound guidance for real time vessel cannulation.     Posey BoyerBrooke Simpson, AGACNP-BC [facility] Pgr: [phone] or if no answer [phone] 11/22/2018, 8:24 AM

## 2018-11-27 NOTE — ED Notes (Signed)
CCM at bedside placing central line. 

## 2018-11-27 NOTE — Progress Notes (Signed)
  Echocardiogram 2D Echocardiogram with definity has been performed.  Leta JunglingCooper, Maleya Leever M November 02, 2018, 10:23 AM

## 2018-11-27 NOTE — ED Notes (Signed)
Dr Rhunette CroftNanavati informed of lactic acid 16.12 and CBG 44

## 2018-11-27 NOTE — ED Triage Notes (Signed)
Wife heard pt collapse in the bathroom, with full body jerking. Pt was unresponsive on EMS arrival, initial sats 68% on 4L [place] home oxygen. Hx of COPD. Pt noted to be cyanotic and mottled. IO placed in L tibia by EMS, Unable to obtain bp in the field, no palpable radial pulses, capnography 20.   Per wife, pt seen PCP on Thursday or Friday for NV and was given antiemetics. Yesterday, pt was lethargic and laid around most of the day. On arrival pt responsive at times, very lethargic, on NRB, unable to obtain accurate pulse ox rhythm

## 2018-11-28 ENCOUNTER — Inpatient Hospital Stay (HOSPITAL_COMMUNITY): Payer: Medicare HMO

## 2018-11-28 DIAGNOSIS — G9340 Encephalopathy, unspecified: Secondary | ICD-10-CM

## 2018-11-28 DIAGNOSIS — N179 Acute kidney failure, unspecified: Secondary | ICD-10-CM

## 2018-11-28 DIAGNOSIS — A419 Sepsis, unspecified organism: Principal | ICD-10-CM

## 2018-11-28 DIAGNOSIS — R6521 Severe sepsis with septic shock: Secondary | ICD-10-CM

## 2018-11-28 LAB — BASIC METABOLIC PANEL
ANION GAP: 18 — AB (ref 5–15)
Anion gap: 16 — ABNORMAL HIGH (ref 5–15)
BUN: 69 mg/dL — ABNORMAL HIGH (ref 8–23)
BUN: 81 mg/dL — ABNORMAL HIGH (ref 8–23)
CALCIUM: 7.1 mg/dL — AB (ref 8.9–10.3)
CALCIUM: 7.4 mg/dL — AB (ref 8.9–10.3)
CO2: 26 mmol/L (ref 22–32)
CO2: 23 mmol/L (ref 22–32)
Chloride: 96 mmol/L — ABNORMAL LOW (ref 98–111)
Chloride: 96 mmol/L — ABNORMAL LOW (ref 98–111)
Creatinine, Ser: 5.24 mg/dL — ABNORMAL HIGH (ref 0.61–1.24)
Creatinine, Ser: 5.57 mg/dL — ABNORMAL HIGH (ref 0.61–1.24)
GFR calc Af Amer: 12 mL/min — ABNORMAL LOW (ref >60)
GFR calc Af Amer: 11 mL/min — ABNORMAL LOW (ref >60)
GFR calc non Af Amer: 11 mL/min — ABNORMAL LOW (ref >60)
GFR calc non Af Amer: 10 mL/min — ABNORMAL LOW (ref >60)
Glucose, Bld: 143 mg/dL — ABNORMAL HIGH (ref 70–99)
Glucose, Bld: 159 mg/dL — ABNORMAL HIGH (ref 70–99)
Potassium: 5.7 mmol/L — ABNORMAL HIGH (ref 3.5–5.1)
Potassium: 6 mmol/L — ABNORMAL HIGH (ref 3.5–5.1)
Sodium: 138 mmol/L (ref 135–145)
Sodium: 137 mmol/L (ref 135–145)

## 2018-11-28 LAB — GLUCOSE, CAPILLARY
GLUCOSE-CAPILLARY: 143 mg/dL — AB (ref 70–99)
Glucose-Capillary: 133 mg/dL — ABNORMAL HIGH (ref 70–99)
Glucose-Capillary: 140 mg/dL — ABNORMAL HIGH (ref 70–99)
Glucose-Capillary: 142 mg/dL — ABNORMAL HIGH (ref 70–99)
Glucose-Capillary: 151 mg/dL — ABNORMAL HIGH (ref 70–99)
Glucose-Capillary: 163 mg/dL — ABNORMAL HIGH (ref 70–99)

## 2018-11-28 LAB — RESPIRATORY PANEL BY PCR
Adenovirus: NOT DETECTED
Bordetella pertussis: NOT DETECTED
CORONAVIRUS 229E-RVPPCR: NOT DETECTED
CORONAVIRUS HKU1-RVPPCR: NOT DETECTED
Chlamydophila pneumoniae: NOT DETECTED
Coronavirus NL63: NOT DETECTED
Coronavirus OC43: NOT DETECTED
Influenza A: NOT DETECTED
Influenza B: NOT DETECTED
METAPNEUMOVIRUS-RVPPCR: NOT DETECTED
Mycoplasma pneumoniae: NOT DETECTED
Parainfluenza Virus 1: NOT DETECTED
Parainfluenza Virus 2: NOT DETECTED
Parainfluenza Virus 3: NOT DETECTED
Parainfluenza Virus 4: NOT DETECTED
Respiratory Syncytial Virus: NOT DETECTED
Rhinovirus / Enterovirus: NOT DETECTED

## 2018-11-28 LAB — CBC WITH DIFFERENTIAL/PLATELET
Abs Immature Granulocytes: 0.11 10*3/uL — ABNORMAL HIGH (ref 0.00–0.07)
Basophils Absolute: 0.1 10*3/uL (ref 0.0–0.1)
Basophils Relative: 1 %
Eosinophils Absolute: 0.1 10*3/uL (ref 0.0–0.5)
Eosinophils Relative: 1 %
HCT: 39.1 % (ref 39.0–52.0)
Hemoglobin: 11.3 g/dL — ABNORMAL LOW (ref 13.0–17.0)
IMMATURE GRANULOCYTES: 2 %
Lymphocytes Relative: 3 %
Lymphs Abs: 0.2 10*3/uL — ABNORMAL LOW (ref 0.7–4.0)
MCH: 25.6 pg — AB (ref 26.0–34.0)
MCHC: 28.9 g/dL — ABNORMAL LOW (ref 30.0–36.0)
MCV: 88.7 fL (ref 80.0–100.0)
Monocytes Absolute: 0.7 10*3/uL (ref 0.1–1.0)
Monocytes Relative: 9 %
NRBC: 0.7 % — AB (ref 0.0–0.2)
Neutro Abs: 6.2 10*3/uL (ref 1.7–7.7)
Neutrophils Relative %: 84 %
Platelets: 225 10*3/uL (ref 150–400)
RBC: 4.41 MIL/uL (ref 4.22–5.81)
RDW: 14.9 % (ref 11.5–15.5)
WBC: 7.3 10*3/uL (ref 4.0–10.5)

## 2018-11-28 LAB — MAGNESIUM: Magnesium: 1.8 mg/dL (ref 1.7–2.4)

## 2018-11-28 LAB — TROPONIN I: TROPONIN I: 0.45 ng/mL — AB (ref <0.03)

## 2018-11-28 LAB — POCT I-STAT 3, ART BLOOD GAS (G3+)
Acid-base deficit: 2 mmol/L (ref 0.0–2.0)
Bicarbonate: 25.8 mmol/L (ref 20.0–28.0)
O2 Saturation: 93 %
Patient temperature: 99.6
TCO2: 28 mmol/L (ref 22–32)
pCO2 arterial: 61.6 mmHg — ABNORMAL HIGH (ref 32.0–48.0)
pH, Arterial: 7.234 — ABNORMAL LOW (ref 7.350–7.450)
pO2, Arterial: 82 mmHg — ABNORMAL LOW (ref 83.0–108.0)

## 2018-11-28 LAB — POCT ACTIVATED CLOTTING TIME: Activated Clotting Time: 186 seconds

## 2018-11-28 LAB — URINE CULTURE: Culture: NO GROWTH

## 2018-11-28 LAB — PATHOLOGIST SMEAR REVIEW

## 2018-11-28 LAB — HIV ANTIBODY (ROUTINE TESTING W REFLEX): HIV Screen 4th Generation wRfx: NONREACTIVE

## 2018-11-28 MED ORDER — ETOMIDATE 2 MG/ML IV SOLN
20.0000 mg | Freq: Once | INTRAVENOUS | Status: AC
  Administered 2018-11-28: 20 mg via INTRAVENOUS

## 2018-11-28 MED ORDER — PRISMASOL BGK 4/2.5 32-4-2.5 MEQ/L REPLACEMENT SOLN
Status: DC
  Administered 2018-11-28: 23:00:00 via INTRAVENOUS_CENTRAL
  Filled 2018-11-28 (×2): qty 5000

## 2018-11-28 MED ORDER — SODIUM CHLORIDE 0.9 % IV SOLN
250.0000 [IU]/h | INTRAVENOUS | Status: DC
  Administered 2018-11-28: 250 [IU]/h via INTRAVENOUS_CENTRAL
  Administered 2018-11-29: 900 [IU]/h via INTRAVENOUS_CENTRAL
  Administered 2018-11-29: 1300 [IU]/h via INTRAVENOUS_CENTRAL
  Administered 2018-11-30 (×2): 1350 [IU]/h via INTRAVENOUS_CENTRAL
  Filled 2018-11-28 (×6): qty 2

## 2018-11-28 MED ORDER — HEPARIN SODIUM (PORCINE) 1000 UNIT/ML DIALYSIS
1000.0000 [IU] | INTRAMUSCULAR | Status: DC | PRN
  Filled 2018-11-28: qty 6

## 2018-11-28 MED ORDER — SODIUM ZIRCONIUM CYCLOSILICATE 5 G PO PACK
5.0000 g | PACK | Freq: Three times a day (TID) | ORAL | Status: DC
  Administered 2018-11-28 (×2): 5 g via ORAL
  Filled 2018-11-28 (×3): qty 1

## 2018-11-28 MED ORDER — PRISMASOL BGK 4/2.5 32-4-2.5 MEQ/L IV SOLN
INTRAVENOUS | Status: DC
  Administered 2018-11-28 – 2018-11-30 (×13): via INTRAVENOUS_CENTRAL
  Filled 2018-11-28 (×20): qty 5000

## 2018-11-28 MED ORDER — HEPARIN (PORCINE) 2000 UNITS/L FOR CRRT
INTRAVENOUS_CENTRAL | Status: DC | PRN
  Administered 2018-11-28: 23:00:00 via INTRAVENOUS_CENTRAL
  Filled 2018-11-28: qty 1000

## 2018-11-28 MED ORDER — SODIUM BICARBONATE 8.4 % IV SOLN
50.0000 meq | Freq: Once | INTRAVENOUS | Status: AC
  Administered 2018-11-28: 50 meq via INTRAVENOUS
  Filled 2018-11-28: qty 50

## 2018-11-28 MED ORDER — VANCOMYCIN HCL IN DEXTROSE 1-5 GM/200ML-% IV SOLN
1000.0000 mg | INTRAVENOUS | Status: DC
  Administered 2018-11-29 – 2018-11-30 (×2): 1000 mg via INTRAVENOUS
  Filled 2018-11-28 (×2): qty 200

## 2018-11-28 MED ORDER — ETOMIDATE 2 MG/ML IV SOLN
INTRAVENOUS | Status: AC
  Filled 2018-11-28: qty 10

## 2018-11-28 MED ORDER — MAGNESIUM SULFATE 2 GM/50ML IV SOLN
2.0000 g | Freq: Once | INTRAVENOUS | Status: AC
  Administered 2018-11-28: 2 g via INTRAVENOUS
  Filled 2018-11-28: qty 50

## 2018-11-28 MED ORDER — SODIUM CHLORIDE 0.9 % IV SOLN
2.0000 g | Freq: Two times a day (BID) | INTRAVENOUS | Status: DC
  Administered 2018-11-28 – 2018-11-30 (×4): 2 g via INTRAVENOUS
  Filled 2018-11-28 (×5): qty 2

## 2018-11-28 MED ORDER — PRISMASOL BGK 4/2.5 32-4-2.5 MEQ/L REPLACEMENT SOLN
Status: DC
  Administered 2018-11-28 – 2018-11-30 (×3): via INTRAVENOUS_CENTRAL
  Filled 2018-11-28 (×4): qty 5000

## 2018-11-28 MED ORDER — HEPARIN BOLUS VIA INFUSION (CRRT)
1000.0000 [IU] | INTRAVENOUS | Status: DC | PRN
  Filled 2018-11-28: qty 1000

## 2018-11-28 MED ORDER — HEPARIN SODIUM (PORCINE) 1000 UNIT/ML IJ SOLN
4000.0000 [IU] | Freq: Once | INTRAMUSCULAR | Status: AC
  Administered 2018-11-28: 3200 [IU] via INTRAVENOUS
  Filled 2018-11-28: qty 4

## 2018-11-28 MED ORDER — CALCIUM GLUCONATE-NACL 1-0.675 GM/50ML-% IV SOLN
1.0000 g | Freq: Once | INTRAVENOUS | Status: AC
  Administered 2018-11-28: 1000 mg via INTRAVENOUS
  Filled 2018-11-28: qty 50

## 2018-11-28 MED ORDER — SODIUM POLYSTYRENE SULFONATE 15 GM/60ML PO SUSP
30.0000 g | Freq: Once | ORAL | Status: AC
  Administered 2018-11-28: 30 g
  Filled 2018-11-28: qty 120

## 2018-11-28 MED ORDER — LEVOTHYROXINE SODIUM 100 MCG IV SOLR
100.0000 ug | Freq: Every day | INTRAVENOUS | Status: DC
  Filled 2018-11-28: qty 5

## 2018-11-28 NOTE — Consult Note (Signed)
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 [address] [place], [place] [address]   Troponin I - Now Then Q6H     Status: Abnormal   Collection Time: 11/22/2018  2:22 PM  Result Value Ref Range   Troponin I 0.11 (HH) <0.03 ng/mL    Comment: CRITICAL VALUE NOTED.  VALUE IS CONSISTENT WITH PREVIOUSLY REPORTED AND CALLED VALUE. Performed at [facility], [place] [address], [place], [place] [address]   Comprehensive metabolic panel     Status: Abnormal   Collection Time: 12/11/2018  2:22 PM  Result Value Ref  Range   Sodium 138 135 - 145 mmol/L   Potassium 4.3 3.5 - 5.1 mmol/L   Chloride 96 (L) 98 - 111 mmol/L   CO2 25 22 - 32 mmol/L   Glucose, Bld 239 (H) 70 - 99 mg/dL   BUN 56 (H) 8 - 23 mg/dL   Creatinine, Ser 4.30 (H) 0.61 - 1.24 mg/dL   Calcium 7.1 (L) 8.9 - 10.3 mg/dL   Total Protein 4.8 (L) 6.5 - 8.1 g/dL   Albumin 2.0 (L) 3.5 - 5.0 g/dL   AST 1,606 (H) 15 - 41 U/L   ALT 1,837 (H) 0 - 44 U/L   Alkaline Phosphatase 73 38 - 126 U/L   Total Bilirubin 1.1 0.3 - 1.2 mg/dL   GFR calc non Af Amer 13 (L) >60 mL/min   GFR calc Af Amer 16 (L) >60 mL/min   Anion gap 17 (H) 5 - 15    Comment: Performed at [facility], [place] [address] [place], [place] [address]  Glucose, capillary     Status: Abnormal   Collection Time: 11/16/2018  3:08 PM  Result Value Ref Range   Glucose-Capillary 208 (H) 70 - 99 mg/dL  Glucose, capillary     Status: Abnormal   Collection Time: 11/29/2018  8:29 PM  Result Value Ref Range   Glucose-Capillary 180 (H) 70 - 99 mg/dL  Glucose, capillary     Status: Abnormal   Collection Time: 11/28/18 12:31 AM  Result Value Ref Range   Glucose-Capillary 142 (H) 70 - 99 mg/dL  CBC with Differential/Platelet     Status: Abnormal   Collection Time: 11/28/18  4:05 AM  Result Value Ref Range   WBC 7.3 4.0 - 10.5 K/uL    Comment: WHITE COUNT CONFIRMED ON SMEAR   RBC 4.41 4.22 - 5.81 MIL/uL   Hemoglobin 11.3 (L) 13.0 - 17.0 g/dL   HCT 39.1 39.0 - 52.0 %   MCV 88.7 80.0 - 100.0 fL   MCH 25.6 (L) 26.0 - 34.0 pg   MCHC 28.9 (L) 30.0 - 36.0 g/dL   RDW 14.9 11.5 - 15.5 %   Platelets 225 150 - 400 K/uL   nRBC 0.7 (H) 0.0 - 0.2 %   Neutrophils Relative % 84 %   Neutro Abs 6.2 1.7 - 7.7 K/uL   Lymphocytes Relative 3 %   Lymphs Abs 0.2 (L) 0.7 - 4.0 K/uL   Monocytes Relative 9 %  Monocytes Absolute 0.7 0.1 - 1.0 K/uL   Eosinophils Relative 1 %   Eosinophils Absolute 0.1 0.0 - 0.5 K/uL   Basophils Relative 1 %   Basophils Absolute 0.1 0.0 - 0.1 K/uL   WBC Morphology DOHLE  BODIES     Comment: MILD LEFT SHIFT (1-5% METAS, OCC MYELO, OCC BANDS)   RBC Morphology POLYCHROMASIA PRESENT     Comment: BURR CELLS   Immature Granulocytes 2 %   Abs Immature Granulocytes 0.11 (H) 0.00 - 0.07 K/uL    Comment: Performed at [facility] [address] [place], [place] [address]  Magnesium     Status: None   Collection Time: 11/28/18  4:05 AM  Result Value Ref Range   Magnesium 1.8 1.7 - 2.4 mg/dL    Comment: Performed at [facility] [address], [place], [place] [address]  Basic metabolic panel     Status: Abnormal   Collection Time: 11/28/18  4:05 AM  Result Value Ref Range   Sodium 138 135 - 145 mmol/L   Potassium 5.7 (H) 3.5 - 5.1 mmol/L    Comment: NO VISIBLE HEMOLYSIS   Chloride 96 (L) 98 - 111 mmol/L   CO2 26 22 - 32 mmol/L   Glucose, Bld 143 (H) 70 - 99 mg/dL   BUN 69 (H) 8 - 23 mg/dL   Creatinine, Ser 5.24 (H) 0.61 - 1.24 mg/dL   Calcium 7.1 (L) 8.9 - 10.3 mg/dL   GFR calc non Af Amer 11 (L) >60 mL/min   GFR calc Af Amer 12 (L) >60 mL/min   Anion gap 16 (H) 5 - 15    Comment: Performed at [facility], [place] [address] [place], [place] [address]  Glucose, capillary     Status: Abnormal   Collection Time: 11/28/18  4:35 AM  Result Value Ref Range   Glucose-Capillary 133 (H) 70 - 99 mg/dL  Glucose, capillary     Status: Abnormal   Collection Time: 11/28/18  8:15 AM  Result Value Ref Range   Glucose-Capillary 143 (H) 70 - 99 mg/dL  Glucose, capillary     Status: Abnormal   Collection Time: 11/28/18 12:13 PM  Result Value Ref Range   Glucose-Capillary 140 (H) 70 - 99 mg/dL     ROS:  Review of systems not obtained due to patient factors.  Physical Exam: Vitals:   11/28/18 1200 11/28/18 1215  BP:  (!) 99/51  Pulse: 92 92  Resp: (!) 24 (!) 24  Temp:    SpO2: 92% 94%     General: intubated , sedated  HEENT: PERRLA, mucous membranes moist Neck: No JVD Heart: RRR Lungs: dec BS Abdomen: obese, ventral hernia- somewhat  distended but no guarding or rebound Extremities: trace to 1 + edema Skin: warm and dry Neuro: sedated  Assessment/Plan: 67 year old WM with advanced COPD presenting with hypotension/hypothermia on ARB as OP with AKI 1.Renal- reported creatinine at baseline normal.  AKI in the setting of shock.  Urinalysis fairly bland and U/S unremarkable so likely ATN.  Crt initially improved, probably with hydration - now worsening and essentially no UOP.  Suspect will get to point where will need CRRT support.  Potassium is going to be the key.  I am checking one later this afternoon- if much higher will plan on starting CRRT tonight, if better, will reassess in the AM.   2. Hypertension/volume  - 9 liters positive but still hypotensive on pressors.  Not  significant peripheral edema yet but O2 req not minimal  3. Hyperkalemia- given kaexylate- I have now ordered scheduled scheduled lokelma 4. Anemia  - not a major issue yet    Kellie A Goldsborough 11/28/2018, 2:09 PM     

## 2018-11-28 NOTE — Progress Notes (Signed)
eLink Physician-Brief Progress Note Patient Name: Samuel CaoMichael J Fossum Sr. DOB: 1951-07-03 MRN: [id number]   Date of Service  11/28/2018  HPI/Events of Note  Nurse reports that ETT slipped out while putting patient on cooling blanket. Tube replaced. Sats = 88% to 92%  eICU Interventions  Will order: 1. Portable CXR STAT.      Intervention Category Major Interventions: Hypoxemia - evaluation and management  [name] 11/28/2018, 3:23 AM

## 2018-11-28 NOTE — Progress Notes (Signed)
eLink Physician-Brief Progress Note Patient Name: Clovis CaoMichael J Wickwire Sr. DOB: 05-11-1951 MRN: [id number]   Date of Service  11/28/2018  HPI/Events of Note  Fever to 101.4 - No antipyretic ordered. AST and ALT elevated and Creatinine = 4.30. Therefore, can't use Tylenol or Motrin. Presently on Vancomycin and Aztreonam.   eICU Interventions  Will order: 1. Cooling blanket PRN.      Intervention Category Major Interventions: Other:  Lenell AntuSommer,Janyia Guion Eugene 11/28/2018, 12:47 AM

## 2018-11-28 NOTE — Progress Notes (Signed)
Pt transported from 28M 06 to CT and back without incident.

## 2018-11-28 NOTE — Procedures (Signed)
Arterial Catheter Insertion Procedure Note Samuel CaoMichael J Roughton Sr. [id number] Apr 20, 1951  Procedure: Insertion of Arterial Catheter  Indications: Blood pressure monitoring and Frequent blood sampling  Procedure Details Consent: Risks of procedure as well as the alternatives and risks of each were explained to the (patient/caregiver).  Consent for procedure obtained. Time Out: Verified patient identification, verified procedure, site/side was marked, verified correct patient position, special equipment/implants available, medications/allergies/relevent history reviewed, required imaging and test results available.  Performed  Maximum sterile technique was used including antiseptics, cap, gloves, gown, hand hygiene, mask and sheet. Skin prep: Chlorhexidine; local anesthetic administered 20 gauge catheter was inserted into left femoral artery using the Seldinger technique. ULTRASOUND GUIDANCE USED: YES Evaluation Blood flow good; BP tracing good. Complications: No apparent complications.   [name] 11/28/2018
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
## 2018-11-28 NOTE — Progress Notes (Signed)
Pharmacy Antibiotic Note  Samuel CaoMichael J Abelson Sr. is a 67 y.o. male admitted on 11/20/2018 unresponsive. Has been nauseous and vomiting. Required intubation in the ED. AKI on admit, Baseline SCr 0.7 > now up to 5.5, Phos 12, Mg 2.5, LA 16. Abx for sepsis.   12/17- CRRT initiating tonight.  Plan: -Adjust vancomycin to 1000 mg q 24 hrs while on CRRT -Adjust aztreonam to 2g q 12 hrs while on CRRT. -Continue flagyl 500 mg IV q8h -Monitor renal fx, culturs, VR as needed   Weight: 244 lb 4.3 oz (110.8 kg)  Temp (24hrs), Avg:100.2 F (37.9 C), Min:97.7 F (36.5 C), Max:103.7 F (39.8 C)  Recent Labs  Lab 12/04/2018 0549 11/15/2018 0615 11/24/2018 0924 12/04/2018 1422 11/28/18 0405 11/28/18 1659  WBC  --  13.7*  --   --  7.3  --   CREATININE 5.60* 5.53*  --  4.30* 5.24* 5.57*  LATICACIDVEN 16.12*  --  4.5*  --   --   --     Estimated Creatinine Clearance: 14.5 mL/min (A) (by C-G formula based on SCr of 5.57 mg/dL (H)).    Allergies  Allergen Reactions  . Penicillins Swelling and Rash    Has patient had a PCN reaction causing immediate rash, facial/tongue/throat swelling, SOB or lightheadedness with hypotension: Yes Has patient had a PCN reaction causing severe rash involving mucus membranes or skin necrosis: Yes Has patient had a PCN reaction that required hospitalization: No Has patient had a PCN reaction occurring within the last 10 years: No If all of the above answers are "NO", then may proceed with Cephalosporin use.     Antimicrobials this admission: 12/16 vancomycin > 12/16 aztrenam > 12/16 flagyl >  Dose adjustments this admission: N/A   Microbiology results: 12/16 blood cx: 12/16 urine cx:  Jenetta DownerJessica Lisabeth Mian, Pharm D, BCPS, [facility] Clinical Pharmacist Phone [phone]  11/28/2018 6:43 PM

## 2018-11-28 NOTE — Procedures (Signed)
Central Venous Trialysis Catheter Insertion Procedure Note Clovis CaoMichael J Onder Sr. [id number] [birth year]  Procedure: Insertion of Central Venous Catheter Indications: Dialysis  Procedure Details Consent: Risks of procedure as well as the alternatives and risks of each were explained to the (patient/caregiver).  Consent for procedure obtained. Time Out: Verified patient identification, verified procedure, site/side was marked, verified correct patient position, special equipment/implants available, medications/allergies/relevent history reviewed, required imaging and test results available.  Performed  Maximum sterile technique was used including antiseptics, cap, gloves, gown, hand hygiene, mask and sheet. Skin prep: Chlorhexidine; local anesthetic administered A antimicrobial bonded/coated triple lumen catheter was placed in the left femoral vein due to patient being a dialysis patient using the Seldinger technique.  Evaluation Blood flow good Complications: No apparent complications Patient did tolerate procedure well. Chest X-ray ordered to verify placement.  CXR: Not necessary.  YACOUB,WESAM 11/28/2018, 4:17 PM

## 2018-11-29 ENCOUNTER — Inpatient Hospital Stay (HOSPITAL_COMMUNITY): Payer: Medicare HMO

## 2018-11-29 LAB — COMPREHENSIVE METABOLIC PANEL
ALBUMIN: 1.8 g/dL — AB (ref 3.5–5.0)
ALT: 1661 U/L — ABNORMAL HIGH (ref 0–44)
ANION GAP: 17 — AB (ref 5–15)
AST: 526 U/L — ABNORMAL HIGH (ref 15–41)
Alkaline Phosphatase: 101 U/L (ref 38–126)
BUN: 66 mg/dL — ABNORMAL HIGH (ref 8–23)
CO2: 22 mmol/L (ref 22–32)
Calcium: 7.6 mg/dL — ABNORMAL LOW (ref 8.9–10.3)
Chloride: 98 mmol/L (ref 98–111)
Creatinine, Ser: 4.5 mg/dL — ABNORMAL HIGH (ref 0.61–1.24)
GFR calc Af Amer: 15 mL/min — ABNORMAL LOW (ref >60)
GFR calc non Af Amer: 13 mL/min — ABNORMAL LOW (ref >60)
GLUCOSE: 157 mg/dL — AB (ref 70–99)
Potassium: 5.8 mmol/L — ABNORMAL HIGH (ref 3.5–5.1)
Sodium: 137 mmol/L (ref 135–145)
Total Bilirubin: 0.9 mg/dL (ref 0.3–1.2)
Total Protein: 5.3 g/dL — ABNORMAL LOW (ref 6.5–8.1)

## 2018-11-29 LAB — CBC
HCT: 34.4 % — ABNORMAL LOW (ref 39.0–52.0)
Hemoglobin: 10.2 g/dL — ABNORMAL LOW (ref 13.0–17.0)
MCH: 26 pg (ref 26.0–34.0)
MCHC: 29.7 g/dL — AB (ref 30.0–36.0)
MCV: 87.8 fL (ref 80.0–100.0)
Platelets: DECREASED 10*3/uL (ref 150–400)
RBC: 3.92 MIL/uL — ABNORMAL LOW (ref 4.22–5.81)
RDW: 15.3 % (ref 11.5–15.5)
WBC: 8.2 10*3/uL (ref 4.0–10.5)
nRBC: 1 % — ABNORMAL HIGH (ref 0.0–0.2)

## 2018-11-29 LAB — POCT ACTIVATED CLOTTING TIME
ACTIVATED CLOTTING TIME: 191 s
ACTIVATED CLOTTING TIME: 180 s
ACTIVATED CLOTTING TIME: 186 s
Activated Clotting Time: 169 seconds
Activated Clotting Time: 175 seconds
Activated Clotting Time: 175 seconds
Activated Clotting Time: 180 seconds
Activated Clotting Time: 180 seconds
Activated Clotting Time: 180 seconds
Activated Clotting Time: 180 seconds
Activated Clotting Time: 180 seconds
Activated Clotting Time: 186 seconds
Activated Clotting Time: 186 seconds
Activated Clotting Time: 186 seconds
Activated Clotting Time: 186 seconds
Activated Clotting Time: 186 seconds
Activated Clotting Time: 186 seconds
Activated Clotting Time: 186 seconds
Activated Clotting Time: 186 seconds
Activated Clotting Time: 186 seconds
Activated Clotting Time: 191 seconds
Activated Clotting Time: 191 seconds
Activated Clotting Time: 191 seconds

## 2018-11-29 LAB — BASIC METABOLIC PANEL
Anion gap: 12 (ref 5–15)
BUN: 44 mg/dL — ABNORMAL HIGH (ref 8–23)
CO2: 24 mmol/L (ref 22–32)
Calcium: 8.3 mg/dL — ABNORMAL LOW (ref 8.9–10.3)
Chloride: 100 mmol/L (ref 98–111)
Creatinine, Ser: 2.82 mg/dL — ABNORMAL HIGH (ref 0.61–1.24)
GFR calc non Af Amer: 22 mL/min — ABNORMAL LOW (ref >60)
GFR, EST AFRICAN AMERICAN: 26 mL/min — AB (ref >60)
Glucose, Bld: 147 mg/dL — ABNORMAL HIGH (ref 70–99)
Potassium: 5.2 mmol/L — ABNORMAL HIGH (ref 3.5–5.1)
Sodium: 136 mmol/L (ref 135–145)

## 2018-11-29 LAB — LACTIC ACID, PLASMA: Lactic Acid, Venous: 2.2 mmol/L (ref 0.5–1.9)

## 2018-11-29 LAB — RENAL FUNCTION PANEL
Albumin: 1.5 g/dL — ABNORMAL LOW (ref 3.5–5.0)
Anion gap: 12 (ref 5–15)
BUN: 46 mg/dL — ABNORMAL HIGH (ref 8–23)
CO2: 24 mmol/L (ref 22–32)
Calcium: 8.1 mg/dL — ABNORMAL LOW (ref 8.9–10.3)
Chloride: 101 mmol/L (ref 98–111)
Creatinine, Ser: 3.02 mg/dL — ABNORMAL HIGH (ref 0.61–1.24)
GFR calc Af Amer: 24 mL/min — ABNORMAL LOW (ref >60)
GFR calc non Af Amer: 21 mL/min — ABNORMAL LOW (ref >60)
Glucose, Bld: 136 mg/dL — ABNORMAL HIGH (ref 70–99)
Phosphorus: 4.6 mg/dL (ref 2.5–4.6)
Potassium: 5.4 mmol/L — ABNORMAL HIGH (ref 3.5–5.1)
Sodium: 137 mmol/L (ref 135–145)

## 2018-11-29 LAB — GLUCOSE, CAPILLARY
Glucose-Capillary: 119 mg/dL — ABNORMAL HIGH (ref 70–99)
Glucose-Capillary: 125 mg/dL — ABNORMAL HIGH (ref 70–99)
Glucose-Capillary: 125 mg/dL — ABNORMAL HIGH (ref 70–99)
Glucose-Capillary: 134 mg/dL — ABNORMAL HIGH (ref 70–99)
Glucose-Capillary: 137 mg/dL — ABNORMAL HIGH (ref 70–99)
Glucose-Capillary: 141 mg/dL — ABNORMAL HIGH (ref 70–99)

## 2018-11-29 LAB — PHOSPHORUS: PHOSPHORUS: 6.5 mg/dL — AB (ref 2.5–4.6)

## 2018-11-29 LAB — LIPASE, BLOOD: Lipase: 13 U/L (ref 11–51)

## 2018-11-29 LAB — PROCALCITONIN: Procalcitonin: >150 ng/mL

## 2018-11-29 LAB — TROPONIN I: Troponin I: 0.22 ng/mL (ref <0.03)

## 2018-11-29 LAB — MAGNESIUM: Magnesium: 2.4 mg/dL (ref 1.7–2.4)

## 2018-11-29 LAB — APTT: aPTT: 64 seconds — ABNORMAL HIGH (ref 24–36)

## 2018-11-29 MED ORDER — LEVOTHYROXINE SODIUM 100 MCG/5ML IV SOLN
100.0000 ug | Freq: Every day | INTRAVENOUS | Status: DC
  Administered 2018-11-29 – 2018-11-30 (×2): 100 ug via INTRAVENOUS
  Filled 2018-11-29 (×2): qty 5

## 2018-11-29 MED ORDER — SODIUM CHLORIDE 0.9 % IV SOLN
INTRAVENOUS | Status: DC | PRN
  Administered 2018-11-29: 250 mL via INTRAVENOUS

## 2018-11-29 MED ORDER — METOPROLOL TARTRATE 5 MG/5ML IV SOLN
INTRAVENOUS | Status: AC
  Filled 2018-11-29: qty 5

## 2018-11-29 MED ORDER — METOPROLOL TARTRATE 5 MG/5ML IV SOLN
2.5000 mg | INTRAVENOUS | Status: AC | PRN
  Administered 2018-11-29 (×3): 5 mg via INTRAVENOUS
  Filled 2018-11-29 (×2): qty 5

## 2018-11-29 MED ORDER — DIGOXIN 0.25 MG/ML IJ SOLN
0.2500 mg | Freq: Once | INTRAMUSCULAR | Status: AC
  Administered 2018-11-29: 0.25 mg via INTRAVENOUS
  Filled 2018-11-29: qty 2

## 2018-11-29 MED ORDER — PRISMASOL BGK 0/2.5 32-2.5 MEQ/L REPLACEMENT SOLN
Status: DC
  Administered 2018-11-29 – 2018-11-30 (×2): via INTRAVENOUS_CENTRAL
  Filled 2018-11-29 (×3): qty 5000

## 2018-11-29 NOTE — Progress Notes (Signed)
Initial Nutrition Assessment  DOCUMENTATION CODES:   Obesity unspecified  INTERVENTION:   Recommend consideration of TPN   NUTRITION DIAGNOSIS:   Inadequate oral intake related to acute illness as evidenced by NPO status.  GOAL:   Patient will meet greater than or equal to 90% of their needs  MONITOR:   Vent status, Labs, Weight trends  REASON FOR ASSESSMENT:   Ventilator    ASSESSMENT:    67 yo male presenting with N/V and AMS admitted with shock, acute on chronic respiratory failure and AKI requiring CRRT; CT abdomen indicating SBO and surgery consulted. PMH includes COPD on 4L oxygen at home, HTN, HLD, CHF   12/16 Intubated 12/17 CRRT initiated, CT abdomen with SBO, chronic ventral hernia containing transverse colon)  Patient is currently intubated on ventilator support, fentanyl for sedation, vasopressin, levophed for pressor support MV: 14.6 L/min Temp (24hrs), Avg:97.9 F (36.6 C), Min:97.3 F (36.3 C), Max:98.3 F (36.8 C)  Wife and daughter at bedside. Per family, pt last ate a good meal on Friday. Pt only eating applesauce and jello on Saturday and Sunday but was throwing up during this time frame. NPO since Monday. Inadequate nutrition x 5 days, now with SBO. Recommend considering TPN as pt at high nutritional risk. Discussed with CCM  Family at bedside and indicate that pt weighed 221-223 pounds at [facility] a few weeks ago. They deny any recent unitentional wt loss, although pt has lost 40 pounds over the past year by eating smaller portions. Current wt 242 pounds. Plan to utilize wt of 222 (101 kg) pounds as dry wt as pt with significant edema on exam  Labs: potassium 5.8, phosphorus 6.5, corrected calcium Meds: reviewed  NUTRITION - FOCUSED PHYSICAL EXAM:    Most Recent Value  Orbital Region  Mild depletion  Upper Arm Region  Unable to assess  Thoracic and Lumbar Region  No depletion  Buccal Region  Unable to assess  Temple Region  Mild depletion   Clavicle Bone Region  No depletion  Clavicle and Acromion Bone Region  No depletion  Scapular Bone Region  No depletion  Dorsal Hand  Unable to assess  Patellar Region  Unable to assess  Anterior Thigh Region  Unable to assess  Posterior Calf Region  Unable to assess  Edema (RD Assessment)  Moderate       Diet Order:   Diet Order            Diet NPO time specified  Diet effective now              EDUCATION NEEDS:   Not appropriate for education at this time  Skin:  Skin Assessment: Skin Integrity Issues: Skin Integrity Issues:: Other (Comment) Other: non-pressure wound on thigh  Last BM:  no BM  Height:   Ht Readings from Last 1 Encounters:  11/29/18 5\' 5"  (1.651 m)    Weight:   Wt Readings from Last 1 Encounters:  11/29/18 109.6 kg    Ideal Body Weight:  61.8 kg  BMI:  Body mass index is 40.21 kg/m.  Estimated Nutritional Needs:   Kcal:  1400 kcals   Protein:  124-154 g  Fluid:  >/= 1.8 L   Romelle Starcherate Keiyana Stehr MS, RD, LDN, CNSC [phone] Pager  [phone] Weekend/On-Call Pager

## 2018-11-29 NOTE — H&P (Signed)
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 There is fecalization of the small bowel contents proximal to the caliber transition. Findings are compatible with a moderate mechanical mid small bowel obstruction, probably due to adhesions. No small bowel wall thickening. Normal appendix. There is a moderate right ventral abdominal hernia containing a portion of the proximal transverse colon. There is a mild large bowel caliber transition at the level of the ventral hernia. No definite large bowel wall thickening or significant pericolonic fat stranding. Vascular/Lymphatic: Atherosclerotic nonaneurysmal abdominal aorta. Left common femoral  central venous catheter terminates in the left common iliac vein. No pathologically enlarged lymph nodes in the abdomen. Other: No pneumoperitoneum, ascites or focal fluid collection. Musculoskeletal: No aggressive appearing focal osseous lesions. Mild thoracolumbar spondylosis. IMPRESSION: 1. CT findings are compatible with a moderate mechanical mid small bowel obstruction with focal caliber transition in the anterior abdomen near the umbilicus. Small bowel obstruction probably due to adhesions. 2. Moderate right ventral abdominal hernia containing a portion of the proximal transverse colon, with mild caliber transition in the colon at the level of the hernia, cannot exclude a separate colonic obstruction at this level. 3. No free air or abscess. 4. Patchy consolidation and ground-glass opacity at both lung bases, most prominent at the dependent right lung base with air bronchograms, suspicious for multilobar pneumonia or aspiration. 5.  Aortic Atherosclerosis (ICD10-I70.0). Electronically Signed   By: Delbert Phenix M.D.   On: 11/28/2018 21:05   US Renal  Result Date: 12-12-18 CLINICAL DATA:  Acute renal failure. EXAM: RENAL / URINARY TRACT ULTRASOUND COMPLETE COMPARISON:  None. FINDINGS: Right Kidney: Renal measurements: 11.8 x 6.2 x 5 1 cm = volume: 195 mL. Minimally echogenic. No mass or hydronephrosis visualized. Left Kidney: Renal measurements: 12.8 x 5.9 x 5.9 cm = volume: 232 mL. Minimally echogenic. No mass or hydronephrosis visualized. Bladder: Not visualized with a Foley catheter in place. IMPRESSION: 1. Minimally echogenic kidneys compatible with minimal medical renal disease. 2. No hydronephrosis. Electronically Signed   By: Beckie Salts M.D.   On: December 12, 2018 21:15   Dg Chest Port 1 View  Result Date: 11/29/2018 CLINICAL DATA:  Check endotracheal tube placement EXAM: PORTABLE CHEST 1 VIEW COMPARISON:  11/29/2018 FINDINGS: Endotracheal tube is been advanced and now lies at the level of the  aortic knob. Left jugular central line is noted in the mid superior vena cava. Nasogastric catheter extends into the stomach. The lungs are well aerated bilaterally with bibasilar opacities consistent with infiltrate. Calcified granulomas are again noted bilaterally. No bony abnormality is seen. IMPRESSION: Tubes and lines as described. Bibasilar infiltrative density. Electronically Signed   By: Alcide Clever M.D.   On: 11/29/2018 11:38   Dg Chest Port 1 View  Result Date: 11/29/2018 CLINICAL DATA:  Chest tube placement. EXAM: PORTABLE CHEST 1 VIEW COMPARISON:  Radiograph of November 28, 2018. FINDINGS: Stable cardiomediastinal silhouette. Endotracheal and nasogastric tubes are unchanged in position. Left internal jugular catheter is noted with tip in expected position of the SVC. No pneumothorax is noted. Bibasilar atelectasis or infiltrates are noted with associated pleural effusions. Bony thorax is unremarkable. IMPRESSION: Stable support apparatus. Stable bibasilar atelectasis or infiltrates are noted with associated pleural effusions. Electronically Signed   By: Lupita Raider,  M.D.   On: 11/29/2018 08:47   Dg Chest Port 1 View  Result Date: 11/28/2018 CLINICAL DATA:  Respiratory failure. EXAM: PORTABLE CHEST 1 VIEW COMPARISON:  Radiograph yesterday at 0811 hour FINDINGS: Endotracheal tube tip 6.3 cm from the carina. Enteric tube in place tip below the diaphragm not included in the field of view. Tip of the left internal jugular central is catheter in the SVC. Worsening bibasilar aeration with increasing atelectasis. Unchanged heart size and mediastinal contours. Mild vascular congestion without pulmonary edema. No pneumothorax. IMPRESSION: 1. Worsening bibasilar aeration with increasing atelectasis. 2. Mild vascular congestion. Electronically Signed   By: Narda Rutherford M.D.   On: 11/28/2018 04:06   US Abdomen Limited Ruq  Result Date: 01-Dec-2018 CLINICAL DATA:  Upper abdominal pain. EXAM:  ULTRASOUND ABDOMEN LIMITED RIGHT UPPER QUADRANT COMPARISON:  None. FINDINGS: Gallbladder: The gallbladder is mildly dilated, measuring 10.7 cm in length. No gallstones, wall thickening or pericholecystic fluid seen. No sonographic Murphy sign. Common bile duct: Diameter: 3.3 mm Liver: No focal lesion identified. Within normal limits in parenchymal echogenicity. Portal vein is patent on color Doppler imaging with normal direction of blood flow towards the liver. IMPRESSION: 1. Mildly dilated gallbladder. This can be seen with lack of oral intake. 2. No cholelithiasis or evidence of cholecystitis. Electronically Signed   By: Beckie Salts M.D.   On: 2018/12/01 21:18   Assessment/Plan Shock leukocytosis Acute on chronic respiratory failure  COPD on home O2 and chronic prednisone AKI - on CRRT HTN HLD Diastolic heart failure Hypothyroidism  Chronic ventral hernia containing transverse colon SBO - PMH ex lap, hernia repair, plan to discuss surgical hx further with family  - no emergent surgical needs, low suspicion for bowel ischemia  - continue OG to LIWS - resuscitation per CCM and CCS will follow    Adam Phenix, [name] 11/29/2018, 1:53 PM Pager: [phone] Consults: [phone]

## 2018-11-29 NOTE — Progress Notes (Signed)
Subjective:  Started on CRRT last evening- K is still high - seems to be improving hemodynamically some  Objective Vital signs in last 24 hours: Vitals:   11/29/18 0730 11/29/18 0748 11/29/18 0751 11/29/18 0755  BP:  (!) 97/43    Pulse: 70 74    Resp: (!) 28 (!) 28    Temp:      TempSrc:      SpO2: 97% 97% 96% 95%  Weight:       Weight change: -1.2 kg  Intake/Output Summary (Last 24 hours) at 11/29/2018 0804 Last data filed at 11/29/2018 0700 Gross per 24 hour  Intake 2997.92 ml  Output 3140 ml  Net -142.08 ml    Assessment/Plan: 67 year old WM with advanced COPD presenting with hypotension/hypothermia on ARB as OP with AKI 1.Renal- reported creatinine at baseline normal.  AKI in the setting of shock.  Urinalysis fairly bland and U/S unremarkable so likely ATN.  Crt initially improved, probably with hydration - then worsening and essentially no UOP.  Initiated CRRT 12/17 via vascath 12/16.  Potassium remains high. I will change post filter fluid to zero K and reassess 2. Hypertension/volume  - 9 liters positive but still hypotensive on pressors.  Not significant peripheral edema yet but O2 req not minimal - attempting fluid removal at 50 per hour  3. Hyperkalemia- given kaexylate yest- ordered scheduled lokelma- now post filter change to zero K and stop lokelma 4. Anemia  - not a major issue yet    Louis Meckel    Labs: Basic Metabolic Panel: Recent Labs  Lab 11/29/2018 [phone] 11/12/2018 0924  11/28/18 0405 11/28/18 1659 11/29/18 0425  NA  --   --    < > 138 137 137  K  --   --    < > 5.7* 6.0* 5.8*  CL  --   --    < > 96* 96* 98  CO2  --   --    < > '26 23 22  '$ GLUCOSE  --   --    < > 143* 159* 157*  BUN  --   --    < > 69* 81* 66*  CREATININE  --   --    < > 5.24* 5.57* 4.50*  CALCIUM  --   --    < > 7.1* 7.4* 7.6*  PHOS 12.0* 6.8*  --   --   --  6.5*   < > = values in this interval not displayed.   Liver Function Tests: Recent Labs  Lab 12/09/2018 0615  12/04/2018 1422 11/29/18 0425  AST 304* 1,606* 526*  ALT 360* 1,837* 1,661*  ALKPHOS 90 73 101  BILITOT 0.8 1.1 0.9  PROT 5.1* 4.8* 5.3*  ALBUMIN 2.1* 2.0* 1.8*   Recent Labs  Lab 12/07/2018 0924 11/29/18 0425  LIPASE 25 13   No results for input(s): AMMONIA in the last 168 hours. CBC: Recent Labs  Lab 11/17/2018 0615 11/28/18 0405 11/29/18 0425  WBC 13.7* 7.3 8.2  NEUTROABS 10.2* 6.2  --   HGB 10.4* 11.3* 10.2*  HCT 38.4* 39.1 34.4*  MCV 94.8 88.7 87.8  PLT 245 225 PLATELET CLUMPS NOTED ON SMEAR, COUNT APPEARS DECREASED   Cardiac Enzymes: Recent Labs  Lab 11/17/2018 0638 11/25/2018 0924 11/13/2018 1422 11/28/18 1659 11/29/18 0425  CKTOTAL  --  990*  --   --   --   TROPONINI 0.05* 0.05* 0.11* 0.45* 0.22*   CBG: Recent Labs  Lab 11/28/18  1643 11/28/18 2012 11/29/18 0015 11/29/18 0424 11/29/18 0759  GLUCAP 151* 163* 137* 141* 134*    Iron Studies: No results for input(s): IRON, TIBC, TRANSFERRIN, FERRITIN in the last 72 hours. Studies/Results: Ct Abdomen Wo Contrast  Result Date: 11/28/2018 CLINICAL DATA:  Inpatient. Abdominal distension. Nausea and vomiting. Intubated. Acute kidney injury. EXAM: CT ABDOMEN WITHOUT CONTRAST TECHNIQUE: Multidetector CT imaging of the abdomen was performed following the standard protocol without IV contrast. COMPARISON:  12/02/2018 right upper quadrant abdominal and renal sonogram. FINDINGS: Lower chest: There is patchy consolidation and ground-glass opacity throughout both lung bases, most prominent in the dependent right lower lobe with air bronchograms. Hepatobiliary: Normal liver with no liver mass. Mildly dilated gallbladder. No radiopaque cholelithiasis. No gallbladder wall thickening or pericholecystic fluid. No biliary ductal dilatation. Pancreas: Normal, with no mass or duct dilation. Spleen: Normal size. No mass. Adrenals/Urinary Tract: Normal adrenals. No renal stones. No hydronephrosis. No contour deforming renal masses.  Stomach/Bowel: Enteric tube terminates in the distal stomach in the antrum. Stomach is mildly distended and fluid-filled with no definite gastric wall thickening. There is diffuse dilatation of the duodenum and jejunum up to 4.7 cm diameter. The distal small bowel is collapsed. There is a focal small bowel caliber transition in the anterior abdomen near the umbilicus (series 3/image 57). There is fecalization of the small bowel contents proximal to the caliber transition. Findings are compatible with a moderate mechanical mid small bowel obstruction, probably due to adhesions. No small bowel wall thickening. Normal appendix. There is a moderate right ventral abdominal hernia containing a portion of the proximal transverse colon. There is a mild large bowel caliber transition at the level of the ventral hernia. No definite large bowel wall thickening or significant pericolonic fat stranding. Vascular/Lymphatic: Atherosclerotic nonaneurysmal abdominal aorta. Left common femoral central venous catheter terminates in the left common iliac vein. No pathologically enlarged lymph nodes in the abdomen. Other: No pneumoperitoneum, ascites or focal fluid collection. Musculoskeletal: No aggressive appearing focal osseous lesions. Mild thoracolumbar spondylosis. IMPRESSION: 1. CT findings are compatible with a moderate mechanical mid small bowel obstruction with focal caliber transition in the anterior abdomen near the umbilicus. Small bowel obstruction probably due to adhesions. 2. Moderate right ventral abdominal hernia containing a portion of the proximal transverse colon, with mild caliber transition in the colon at the level of the hernia, cannot exclude a separate colonic obstruction at this level. 3. No free air or abscess. 4. Patchy consolidation and ground-glass opacity at both lung bases, most prominent at the dependent right lung base with air bronchograms, suspicious for multilobar pneumonia or aspiration. 5.   Aortic Atherosclerosis (ICD10-I70.0). Electronically Signed   By: Ilona Sorrel M.D.   On: 11/28/2018 21:05   US Renal  Result Date: 11/13/2018 CLINICAL DATA:  Acute renal failure. EXAM: RENAL / URINARY TRACT ULTRASOUND COMPLETE COMPARISON:  None. FINDINGS: Right Kidney: Renal measurements: 11.8 x 6.2 x 5 1 cm = volume: 195 mL. Minimally echogenic. No mass or hydronephrosis visualized. Left Kidney: Renal measurements: 12.8 x 5.9 x 5.9 cm = volume: 232 mL. Minimally echogenic. No mass or hydronephrosis visualized. Bladder: Not visualized with a Foley catheter in place. IMPRESSION: 1. Minimally echogenic kidneys compatible with minimal medical renal disease. 2. No hydronephrosis. Electronically Signed   By: Claudie Revering M.D.   On: 11/17/2018 21:15   Dg Chest Port 1 View  Result Date: 11/28/2018 CLINICAL DATA:  Respiratory failure. EXAM: PORTABLE CHEST 1 VIEW COMPARISON:  Radiograph yesterday at 0811 hour  FINDINGS: Endotracheal tube tip 6.3 cm from the carina. Enteric tube in place tip below the diaphragm not included in the field of view. Tip of the left internal jugular central is catheter in the SVC. Worsening bibasilar aeration with increasing atelectasis. Unchanged heart size and mediastinal contours. Mild vascular congestion without pulmonary edema. No pneumothorax. IMPRESSION: 1. Worsening bibasilar aeration with increasing atelectasis. 2. Mild vascular congestion. Electronically Signed   By: Keith Rake M.D.   On: 11/28/2018 04:06   Dg Chest Portable 1 View  Result Date: 11/24/2018 CLINICAL DATA:  Hypoxia EXAM: PORTABLE CHEST 1 VIEW COMPARISON:  November 27, 2018 FINDINGS: Endotracheal tube tip is 4.4 cm above the carina. Central catheter tip is in the superior vena cava. Nasogastric tube tip and side port are below the diaphragm. No pneumothorax. There is atelectatic change in the lung bases. There is a small calcified granuloma in the left upper lobe. The lungs elsewhere are clear. Heart  size and pulmonary vascularity are normal. There is aortic atherosclerosis. No bone lesions. IMPRESSION: Tube and catheter positions as described without pneumothorax. Bibasilar atelectasis. Small calcified granuloma left upper lobe. Stable cardiac silhouette. Electronically Signed   By: Lowella Grip III M.D.   On: 12/07/2018 08:27   US Abdomen Limited Ruq  Result Date: 11/20/2018 CLINICAL DATA:  Upper abdominal pain. EXAM: ULTRASOUND ABDOMEN LIMITED RIGHT UPPER QUADRANT COMPARISON:  None. FINDINGS: Gallbladder: The gallbladder is mildly dilated, measuring 10.7 cm in length. No gallstones, wall thickening or pericholecystic fluid seen. No sonographic Murphy sign. Common bile duct: Diameter: 3.3 mm Liver: No focal lesion identified. Within normal limits in parenchymal echogenicity. Portal vein is patent on color Doppler imaging with normal direction of blood flow towards the liver. IMPRESSION: 1. Mildly dilated gallbladder. This can be seen with lack of oral intake. 2. No cholelithiasis or evidence of cholecystitis. Electronically Signed   By: Claudie Revering M.D.   On: 11/17/2018 21:18   Medications: Infusions: .  prismasol BGK 4/2.5 300 mL/hr at 11/28/18 2234  .  prismasol BGK 4/2.5 300 mL/hr at 11/28/18 2234  . aztreonam Stopped (11/28/18 2056)  . epinephrine Stopped (12/02/2018 2028)  . famotidine (PEPCID) IV Stopped (11/28/18 0850)  . fentaNYL infusion INTRAVENOUS 150 mcg/hr (11/29/18 0700)  . heparin 10,000 units/ 20 mL infusion syringe 500 Units/hr (11/29/18 0710)  . heparin 999 mL/hr at 11/28/18 2233  . metronidazole Stopped (11/29/18 7494)  . norepinephrine (LEVOPHED) Adult infusion 35 mcg/min (11/29/18 0700)  . phenylephrine (NEO-SYNEPHRINE) Adult infusion Stopped (11/29/18 0306)  . prismasol BGK 4/2.5 1,500 mL/hr at 11/29/18 0512  . vancomycin Stopped (11/29/18 4967)  . vasopressin (PITRESSIN) infusion - *FOR SHOCK* 0.03 Units/min (11/29/18 0700)    Scheduled Medications: .  arformoterol  15 mcg Nebulization BID  . budesonide (PULMICORT) nebulizer solution  0.5 mg Nebulization BID  . chlorhexidine gluconate (MEDLINE KIT)  15 mL Mouth Rinse BID  . hydrocortisone sod succinate (SOLU-CORTEF) inj  50 mg Intravenous Q6H  . levothyroxine  100 mcg Intravenous Daily  . mouth rinse  15 mL Mouth Rinse 10 times per day  . pantoprazole (PROTONIX) IV  40 mg Intravenous Q12H  . sodium zirconium cyclosilicate  5 g Oral TID    have reviewed scheduled and prn medications.  Physical Exam: General: sedated on vent  Heart: RRR Lungs: poor air movement- CBS bilat  Abdomen: hernia- distended Extremities: some pitting edema Dialysis Access: left femoral vascath placed 12/17     11/29/2018,8:04 AM  LOS: 2 days

## 2018-11-29 NOTE — Progress Notes (Signed)
CRITICAL VALUE ALERT  Critical Value:  Troponin 0.22 lactic acid 2.2  Date & Time Notied: 11/29/18 0640  Provider Notified: Dr. Valora PiccoloYap  Orders Received/Actions taken: no new orders at this time

## 2018-11-29 NOTE — Progress Notes (Signed)
This note also relates to the following rows which could not be included: SpO2 - Cannot attach notes to unvalidated device data  RT NOTE: ETT advanced 4cm to 28 at the lips per MD order. Vitals are stable. RT will continue to monitor.

## 2018-11-29 NOTE — Progress Notes (Signed)
eLink Physician-Brief Progress Note Patient Name: Samuel CaoMichael J Banfield [name]. DOB: 01/20/1951 MRN: [id number]   Date of Service  11/29/2018  HPI/Events of Note  AM labs reviewed. Pt is on CRRT.   eICU Interventions  Continue to monitor.     Intervention Category Minor Interventions: Electrolytes abnormality - evaluation and management  [name] 11/29/2018, 6:40 AM

## 2018-11-29 NOTE — Progress Notes (Addendum)
NAME:  [name]., MRN:  [id number], DOB:  21-Jun-1951, LOS: 2 ADMISSION DATE:  12-22-2018, CONSULTATION DATE:  12/22/2018 REFERRING MD:  Dr. Rhunette Croft, CHIEF COMPLAINT:  N/V, AMS  Brief History   49 yoM presenting with N/V and altered mental status, found to be acutely acidotic, lactic 16, acute renal failure, and refractory hypotension despite 5L NS.  Intubated and currently maxed on 2 pressors.     History of present illness   HPI obtained from medical chart review as patient is encephalopathic and intubated.  67 year old male with history of former smoker, COPD on chronic prednisone, chronic respiratory failure on 4L, OSH, chronic diastolic HF, HLD, HTN, hypothyroidism, and obesity presenting from home.  Wife reports onset of abd pain on Friday followed severe nausea and vomiting on Saturday with poor PO intake.  His PCP called in phenergan for him.  Family reports he was more lethargic since yesterday and had normal BM yesterday.  No reports of fever, cough, or SOB. since last Thursday/ Friday.  Was given antiemetics by PCP.  Wife heard him collapse in bathroom and found minimally responsive with EMS.   Presented to ER unresponsive, severely hypotensive, and temp 95.2.  Labs noted for Lactic acid of 16, CBG 44, K 6, BUN 72, sCr 5.6, Hgb 10.4, WBC 13.7, other labs still pending.  CXR noted for bronchitic changes and pulm edema. EKG non acute, trop 0.05.  He was on BiPAP with some improvement in mental status.  ABG 7.056/ 81.6/ 198/ 22.  He was treated with 5L NS, several doses of bicarb, solumedrol, continuous neb, solucortef, and intubated. Currently on max levophed and vasopressin with ongoing hypotension.  PCCM to admit.  Past Medical History  former smoker, COPD on chronic prednisone, chronic respiratory failure on 4L, OSH, chronic diastolic HF, HLD, HTN, hypothyroidism, obesity  Significant Hospital Events   12/16 Admitted  Consults:  12/17:Nephrology  Procedures:  12/16 ETT  >> 12/16 L IJ CVC >>  Significant Diagnostic Tests:  12/16 Renal US Minimally echogenic kidneys compatible with minimal medical renal disease. No hydronephrosis 11/29/2018 CT of the abdomen shows small bowel obstruction and ventral hernia with capsulated bowel Micro Data:  12/16 BCx 2 >> 12/16 trach asp >> GS few yeast, abundant gram + rods>> 12/16 UC >> No growth, negative  Antimicrobials:  12/16 vanc >> 12/16 flagly >> 12/16 aztreonam >>  Interim history/subjective:  Currently on CRRT Sedated on vent FiO2 needs 60% preclude any type of extubation  Objective   Blood pressure (!) 70/43, pulse (!) 110, temperature 97.6 F (36.4 C), temperature source Axillary, resp. rate (!) 28, weight 109.6 kg, SpO2 93 %. CVP:  [5 mmHg-16 mmHg] 8 mmHg  Vent Mode: PRVC FiO2 (%):  [60 %] 60 % Set Rate:  [24 bmp-28 bmp] 28 bmp Vt Set:  [550 mL] 550 mL PEEP:  [8 cmH20] 8 cmH20 Plateau Pressure:  [22 cmH20-26 cmH20] 23 cmH20   Intake/Output Summary (Last 24 hours) at 11/29/2018 0944 Last data filed at 11/29/2018 0900 Gross per 24 hour  Intake 2980.99 ml  Output 3528 ml  Net -547.01 ml   Filed Weights   12-22-2018 0556 11/28/18 0408 11/29/18 0433  Weight: 120 kg 110.8 kg 109.6 kg    Examination: General: Morbidly obese male currently on sedation grimaces and withdraws to pain HEENT: Tracheal tube in place with cuff leak despite multiple attempts to increase cuff size, tube pushed down 4 cm with resolution of cuff leak Neuro: Withdraws  to noxious stimuli CV: Sounds are distant PULM: Creased breath sounds in bases coarse rhonchi GI: Tender to touch, right ventral hernia with ecchymosis surrounding and blanching.  Old midline abdominal scar is noted. Extremities: Lower extremities are cool with mottling Skin: Bilateral thigh ulcers are noted posteriorly      Resolved Hospital Problem list    Assessment & Plan:  Shock- unclear ethology at this time hypovolemic given hx N/V vs  septic vs cardiogenic Note has ulcers on both legs Strangulated ventral hernia - s/p 5L  Remains on Norepi at 30 mcg.min, 220 mcg min phenylepherine, vasopressin at 0.03 Lactate 4.5 on 12/16 Pro calcitonin 23.3 on 12/16 CVP 14 Echo>> EF 30-35%, diffuse hypokinesis, abnormal septal motion, Wall thickness was increased in a pattern of mild LVH. Systolic function was moderately to severely reduced. Diffuse hypokinesis. Left ventricular diastolic function parameters were normal. PA peak pressure: 59 mm Hg (S). P:  Pressor support as needed Wean as tolerated Questionable surgical intervention will be needed    Leukocytosis/ hypothermic PCT 23.13 T max 103.7 Procalcitonin greater than 150 Negative respiratory virus panel, Negative flu P:  Continue antibiotics Monitor culture data Narrow antibiotics when able   Acute on chronic hypoxic/ hypercarbic respiratory failure Hx COPD on home O2 4L/ OSA CXR 12/17>> Worsening bibasilar aeration with increasing atelectasis. Mild vascular congestion P:  Vent bundle Per protocol Bronchodilators Fluid removal with CRRT     AKI/ AGMA/ lactic acidosis - Hyperkalemia 5.8 - previous normal sCr 0.73 10/2018 - Serum Creatinine 4.50 P:  CRRT per renal Monitor electrolytes   Anemia- no clear source of bleeding Coffee ground per OG Small bowel obstruction  P:  Follow CBC Avoid anticoagulation Consider GI and surgical consult  Transaminitis  -Continue to monitor  P:   CT of the abdomen with small bowel obstruction noted N.p.o.  Hypoglycemia P:  Sliding scale insulin protocol  Hx HTN/ HLD P:  Holding home Lasix or statin Lipitor  Hypothyroidism P:  Synthroid 100 mcg IV daily   Best practice:  Diet: NPO in the setting of CT findings compatible with a moderate mechanical mid small bowel obstruction and focal caliber transition anterior abdomen.  Is also moderate right ventral abdominal hernia containing a portion of the  proximal transverse colon. Pain/Anxiety/Delirium protocol (if indicated): fentanyl gtt/ versed prn  VAP protocol (if indicated): yes DVT prophylaxis: heparin sq/ SCDs GI prophylaxis: pepcid Glucose control: CBG q 4 and prn  Mobility: BR Code Status: full Family Communication: 11/29/2018 no family at bedside  Labs   CBC: Recent Labs  Lab 12/05/2018 0549 11/25/2018 0615 11/28/18 0405 11/29/18 0425  WBC  --  13.7* 7.3 8.2  NEUTROABS  --  10.2* 6.2  --   HGB 15.6 10.4* 11.3* 10.2*  HCT 46.0 38.4* 39.1 34.4*  MCV  --  94.8 88.7 87.8  PLT  --  245 225 PLATELET CLUMPS NOTED ON SMEAR, COUNT APPEARS DECREASED    Basic Metabolic Panel: Recent Labs  Lab 11/29/2018 0615 11/16/2018 0638 11/14/2018 0924 12/08/2018 1422 11/28/18 0405 11/28/18 1659 11/29/18 0425  NA 138  --   --  138 138 137 137  K 4.7  --   --  4.3 5.7* 6.0* 5.8*  CL 90*  --   --  96* 96* 96* 98  CO2 19*  --   --  25 26 23 22   GLUCOSE 201*  --   --  239* 143* 159* 157*  BUN 57*  --   --  56* 69* 81* 66*  CREATININE 5.53*  --   --  4.30* 5.24* 5.57* 4.50*  CALCIUM 7.3*  --   --  7.1* 7.1* 7.4* 7.6*  MG  --  2.5*  --   --  1.8  --  2.4  PHOS  --  12.0* 6.8*  --   --   --  6.5*   GFR: Estimated Creatinine Clearance: 17.8 mL/min (A) (by C-G formula based on SCr of 4.5 mg/dL (H)). Recent Labs  Lab 12-06-18 0549 Dec 06, 2018 0615 2018-12-06 0924 11/28/18 0405 11/29/18 0425  PROCALCITON  --   --  23.13  --  >150.00  WBC  --  13.7*  --  7.3 8.2  LATICACIDVEN 16.12*  --  4.5*  --  2.2*    Liver Function Tests: Recent Labs  Lab 12/06/18 0615 2018-12-06 1422 11/29/18 0425  AST 304* 1,606* 526*  ALT 360* 1,837* 1,661*  ALKPHOS 90 73 101  BILITOT 0.8 1.1 0.9  PROT 5.1* 4.8* 5.3*  ALBUMIN 2.1* 2.0* 1.8*   Recent Labs  Lab 12/06/18 0924 11/29/18 0425  LIPASE 25 13   No results for input(s): AMMONIA in the last 168 hours.  ABG    Component Value Date/Time   PHART 7.234 (L) 11/28/2018 1624   PCO2ART 61.6 (H)  11/28/2018 1624   PO2ART 82.0 (L) 11/28/2018 1624   HCO3 25.8 11/28/2018 1624   TCO2 28 11/28/2018 1624   ACIDBASEDEF 2.0 11/28/2018 1624   O2SAT 93.0 11/28/2018 1624     Coagulation Profile: Recent Labs  Lab 12-06-18 0638  INR 1.86    Cardiac Enzymes: Recent Labs  Lab 2018-12-06 1610 2018-12-06 0924 Dec 06, 2018 1422 11/28/18 1659 11/29/18 0425  CKTOTAL  --  990*  --   --   --   TROPONINI 0.05* 0.05* 0.11* 0.45* 0.22*    HbA1C: Hgb A1c MFr Bld  Date/Time Value Ref Range Status  08/08/2017 09:58 AM 6.2 (H) 4.8 - 5.6 % Final    Comment:    (NOTE) Pre diabetes:          5.7%-6.4% Diabetes:              >6.4% Glycemic control for   <7.0% adults with diabetes   11/01/2016 12:43 PM 5.9 (H) <5.7 % Final    Comment:      For someone without known diabetes, a hemoglobin A1c value between 5.7% and 6.4% is consistent with prediabetes and should be confirmed with a follow-up test.   For someone with known diabetes, a value <7% indicates that their diabetes is well controlled. A1c targets should be individualized based on duration of diabetes, age, co-morbid conditions and other considerations.   This assay result is consistent with an increased risk of diabetes.   Currently, no consensus exists regarding use of hemoglobin A1c for diagnosis of diabetes in children.       CBG: Recent Labs  Lab 11/28/18 1643 11/28/18 2012 11/29/18 0015 11/29/18 0424 11/29/18 0759  GLUCAP 151* 163* 137* 141* 134*     Critical care time: 30 min   Brett Canales Minor ACNP Adolph Pollack PCCM Pager [phone] till 1 pm If no answer page [phone] 11/29/2018, 9:44 AM  Attending Note:  66 year old male with septic shock with respiratory failure and renal failure with metabolic acidosis.  On exam, diffuse crackles noted.  I reviewed CXR myself, ETT is in a good position.  Discussed with PCCM-NP.  Will maintain on pressor for BP support.  CRRT  per renal.  Will call surgery to evaluate the CT of  the abdomen for concern for bowel obstruction.  Continue support for now.  The patient is critically ill with multiple organ systems failure and requires high complexity decision making for assessment and support, frequent evaluation and titration of therapies, application of advanced monitoring technologies and extensive interpretation of multiple databases.   Critical Care Time devoted to patient care services described in this note is  33  Minutes. This time reflects time of care of this signee Dr Koren Bound. This critical care time does not reflect procedure time, or teaching time or supervisory time of PA/NP/Med student/Med Resident etc but could involve care discussion time.  Alyson Reedy, M.D. [facility] Pulmonary/Critical Care Medicine. Pager: [phone]. After hours pager: [phone].

## 2018-11-29 NOTE — Progress Notes (Signed)
eLink Physician-Brief Progress Note Patient Name: Samuel CaoMichael J Perrot Sr. DOB: [birth year] MRN: [id number]   Date of Service  11/29/2018  HPI/Events of Note  Transient decrease in HR with metoprolol but back to 140s. K 5.2  eICU Interventions  titrating down norepinephrine Will give one time dose of digoxin 0.25 and not load given renal failure Also avoiding amiodarone due to liver failure     Intervention Category Major Interventions: Arrhythmia - evaluation and management  Darl Pikesmily T Barnabas Henriques 11/29/2018, 9:23 PM

## 2018-11-29 NOTE — Consult Note (Signed)
WOC Nurse wound consult note Patient sedated, intubated on vasopressors and [facility]  No family present. Reason for Consult: Wounds on perineum Wound type: The area is very moist and purple/marron in color consistent with Chronic Tissue Injury from being in a sitting position long term, possibly evolving into a DTPI. Pressure Injury POA: Yes Measurement: Right perineal wound measures 5 cm x 4 cm and has approximately 50% yellow slough.  Left perineal wound measures 5 cm x 3.5 cm and also has significant slough in the wound bed.  Both of these wounds are deep in the fold between the legs and perineum and adjacent to the scrotum. Periwound: Discolored as indicated above, moist skin Dressing procedure/placement/frequency: Cleanse wounds perineal area to the left and right of the scrotum with saline.  Pat dry.  Apply skin prep to the area around the wounds Hart [place]son # [phone]).  Allow to dry.  Place a hydrocolloid Hart [place](Lawson # [id number]) on each wound.  Change every 5 days and prn. Monitor the wound area(s) for worsening of condition such as: Signs/symptoms of infection,  Increase in size,  Development of or worsening of odor, Development of pain, or increased pain at the affected locations.  Notify the medical team if any of these develop.  Thank you for the consult.  Discussed plan of care with the patient and bedside nurse.  WOC nurse will not follow at this time.  Please re-consult the WOC team if needed.  Helmut MusterSherry Bladimir Auman, RN, MSN, CWOCN, CNS-BC, pager [phone]

## 2018-11-29 NOTE — Progress Notes (Signed)
eLink Physician-Brief Progress Note Patient Name: Clovis CaoMichael J Slane Sr. DOB: 12-04-1951 MRN: [id number]   Date of Service  11/29/2018  HPI/Events of Note  Notified of tachycardia 170s. BP 104/47 titrating down pressors. Ongoing CRRT.  eICU Interventions  Ordered metoprolol 5 mg IV q min x3 for HR >140.  If persistent may need to start amiodarone. Repeat BMP stat     Intervention Category Major Interventions: Arrhythmia - evaluation and management  Darl Pikesmily T Lettie Czarnecki 11/29/2018, 8:34 PM

## 2018-11-30 ENCOUNTER — Inpatient Hospital Stay (HOSPITAL_COMMUNITY): Payer: Medicare HMO

## 2018-11-30 ENCOUNTER — Other Ambulatory Visit: Payer: Self-pay

## 2018-11-30 DIAGNOSIS — I5041 Acute combined systolic (congestive) and diastolic (congestive) heart failure: Secondary | ICD-10-CM

## 2018-11-30 DIAGNOSIS — J9600 Acute respiratory failure, unspecified whether with hypoxia or hypercapnia: Secondary | ICD-10-CM

## 2018-11-30 DIAGNOSIS — I480 Paroxysmal atrial fibrillation: Secondary | ICD-10-CM

## 2018-11-30 LAB — GLUCOSE, CAPILLARY
Glucose-Capillary: 122 mg/dL — ABNORMAL HIGH (ref 70–99)
Glucose-Capillary: 131 mg/dL — ABNORMAL HIGH (ref 70–99)
Glucose-Capillary: 134 mg/dL — ABNORMAL HIGH (ref 70–99)
Glucose-Capillary: 139 mg/dL — ABNORMAL HIGH (ref 70–99)
Glucose-Capillary: 144 mg/dL — ABNORMAL HIGH (ref 70–99)

## 2018-11-30 LAB — RENAL FUNCTION PANEL
Albumin: 1.5 g/dL — ABNORMAL LOW (ref 3.5–5.0)
Albumin: 1.6 g/dL — ABNORMAL LOW (ref 3.5–5.0)
Anion gap: 14 (ref 5–15)
Anion gap: 12 (ref 5–15)
BUN: 39 mg/dL — ABNORMAL HIGH (ref 8–23)
BUN: 35 mg/dL — ABNORMAL HIGH (ref 8–23)
CO2: 22 mmol/L (ref 22–32)
CO2: 23 mmol/L (ref 22–32)
Calcium: 8.4 mg/dL — ABNORMAL LOW (ref 8.9–10.3)
Calcium: 8.2 mg/dL — ABNORMAL LOW (ref 8.9–10.3)
Chloride: 100 mmol/L (ref 98–111)
Chloride: 99 mmol/L (ref 98–111)
Creatinine, Ser: 2.31 mg/dL — ABNORMAL HIGH (ref 0.61–1.24)
Creatinine, Ser: 2 mg/dL — ABNORMAL HIGH (ref 0.61–1.24)
GFR calc Af Amer: 33 mL/min — ABNORMAL LOW (ref >60)
GFR calc non Af Amer: 34 mL/min — ABNORMAL LOW (ref >60)
GFR, EST AFRICAN AMERICAN: 39 mL/min — AB (ref >60)
GFR, EST NON AFRICAN AMERICAN: 28 mL/min — AB (ref >60)
Glucose, Bld: 153 mg/dL — ABNORMAL HIGH (ref 70–99)
Glucose, Bld: 146 mg/dL — ABNORMAL HIGH (ref 70–99)
Phosphorus: 4.5 mg/dL (ref 2.5–4.6)
Phosphorus: 3.9 mg/dL (ref 2.5–4.6)
Potassium: 5.2 mmol/L — ABNORMAL HIGH (ref 3.5–5.1)
Potassium: 4.5 mmol/L (ref 3.5–5.1)
Sodium: 136 mmol/L (ref 135–145)
Sodium: 134 mmol/L — ABNORMAL LOW (ref 135–145)

## 2018-11-30 LAB — POCT ACTIVATED CLOTTING TIME
ACTIVATED CLOTTING TIME: 197 s
Activated Clotting Time: 186 seconds
Activated Clotting Time: 197 seconds
Activated Clotting Time: 197 seconds
Activated Clotting Time: 191 seconds

## 2018-11-30 LAB — HEPATIC FUNCTION PANEL
ALK PHOS: 131 U/L — AB (ref 38–126)
ALT: 1121 U/L — ABNORMAL HIGH (ref 0–44)
AST: 155 U/L — AB (ref 15–41)
Albumin: 1.6 g/dL — ABNORMAL LOW (ref 3.5–5.0)
Bilirubin, Direct: 0.2 mg/dL (ref 0.0–0.2)
Indirect Bilirubin: 0.6 mg/dL (ref 0.3–0.9)
Total Bilirubin: 0.8 mg/dL (ref 0.3–1.2)
Total Protein: 5.4 g/dL — ABNORMAL LOW (ref 6.5–8.1)

## 2018-11-30 LAB — POCT I-STAT 3, ART BLOOD GAS (G3+)
Acid-base deficit: 7 mmol/L — ABNORMAL HIGH (ref 0.0–2.0)
Bicarbonate: 20.2 mmol/L (ref 20.0–28.0)
O2 Saturation: 96 %
PCO2 ART: 45.5 mmHg (ref 32.0–48.0)
TCO2: 22 mmol/L (ref 22–32)
pH, Arterial: 7.256 — ABNORMAL LOW (ref 7.350–7.450)
pO2, Arterial: 97 mmHg (ref 83.0–108.0)

## 2018-11-30 LAB — CBC
HCT: 32.5 % — ABNORMAL LOW (ref 39.0–52.0)
Hemoglobin: 9.4 g/dL — ABNORMAL LOW (ref 13.0–17.0)
MCH: 25.5 pg — ABNORMAL LOW (ref 26.0–34.0)
MCHC: 28.9 g/dL — ABNORMAL LOW (ref 30.0–36.0)
MCV: 88.3 fL (ref 80.0–100.0)
PLATELETS: DECREASED 10*3/uL (ref 150–400)
RBC: 3.68 MIL/uL — AB (ref 4.22–5.81)
RDW: 15.8 % — ABNORMAL HIGH (ref 11.5–15.5)
WBC: 10.8 10*3/uL — ABNORMAL HIGH (ref 4.0–10.5)
nRBC: 1 % — ABNORMAL HIGH (ref 0.0–0.2)

## 2018-11-30 LAB — CULTURE, RESPIRATORY
GRAM STAIN: NONE SEEN
SPECIAL REQUESTS: NORMAL

## 2018-11-30 LAB — MAGNESIUM: MAGNESIUM: 2.5 mg/dL — AB (ref 1.7–2.4)

## 2018-11-30 LAB — CULTURE, RESPIRATORY W GRAM STAIN

## 2018-11-30 LAB — LACTIC ACID, PLASMA: Lactic Acid, Venous: 1.7 mmol/L (ref 0.5–1.9)

## 2018-11-30 LAB — APTT: APTT: 63 s — AB (ref 24–36)

## 2018-11-30 MED ORDER — GLYCOPYRROLATE 0.2 MG/ML IJ SOLN: 0.2000 mg | INTRAMUSCULAR | Status: DC | PRN

## 2018-11-30 MED ORDER — AMIODARONE HCL IN DEXTROSE 360-4.14 MG/200ML-% IV SOLN: 30.0000 mg/h | INTRAVENOUS | Status: DC

## 2018-11-30 MED ORDER — DEXTROSE 5 % IV SOLN: INTRAVENOUS | Status: DC

## 2018-11-30 MED ORDER — MORPHINE SULFATE (PF) 2 MG/ML IV SOLN: 2.0000 mg | INTRAVENOUS | Status: DC | PRN

## 2018-11-30 MED ORDER — MORPHINE BOLUS VIA INFUSION
5.0000 mg | INTRAVENOUS | Status: DC | PRN
  Filled 2018-11-30: qty 5

## 2018-11-30 MED ORDER — METOPROLOL TARTRATE 5 MG/5ML IV SOLN
INTRAVENOUS | Status: AC
  Filled 2018-11-30: qty 5

## 2018-11-30 MED ORDER — GLYCOPYRROLATE 1 MG PO TABS: 1.0000 mg | ORAL_TABLET | ORAL | Status: DC | PRN

## 2018-11-30 MED ORDER — TRACE MINERALS CR-CU-MN-SE-ZN 10-1000-500-60 MCG/ML IV SOLN
INTRAVENOUS | Status: DC
  Filled 2018-11-30: qty 460.8

## 2018-11-30 MED ORDER — ACETAMINOPHEN 325 MG PO TABS: 650.0000 mg | ORAL_TABLET | Freq: Four times a day (QID) | ORAL | Status: DC | PRN

## 2018-11-30 MED ORDER — AMIODARONE LOAD VIA INFUSION
150.0000 mg | Freq: Once | INTRAVENOUS | Status: AC
  Administered 2018-11-30: 150 mg via INTRAVENOUS
  Filled 2018-11-30: qty 83.34

## 2018-11-30 MED ORDER — SODIUM CHLORIDE 0.9 % IV SOLN
Freq: Once | INTRAVENOUS | Status: AC
  Administered 2018-11-30: 10:00:00 via INTRAVENOUS

## 2018-11-30 MED ORDER — POLYVINYL ALCOHOL 1.4 % OP SOLN
1.0000 [drp] | Freq: Four times a day (QID) | OPHTHALMIC | Status: DC | PRN
  Filled 2018-11-30: qty 15

## 2018-11-30 MED ORDER — MORPHINE 100MG IN NS 100ML (1MG/ML) PREMIX INFUSION
0.0000 mg/h | INTRAVENOUS | Status: DC
  Administered 2018-11-30: 5 mg/h via INTRAVENOUS
  Filled 2018-11-30: qty 100

## 2018-11-30 MED ORDER — METOPROLOL TARTRATE 5 MG/5ML IV SOLN
2.5000 mg | Freq: Once | INTRAVENOUS | Status: AC
  Administered 2018-11-30: 2.5 mg via INTRAVENOUS

## 2018-11-30 MED ORDER — STERILE WATER FOR INJECTION IV SOLN
INTRAVENOUS | Status: DC
  Administered 2018-11-30 (×2): via INTRAVENOUS_CENTRAL
  Filled 2018-11-30 (×7): qty 150

## 2018-11-30 MED ORDER — DIPHENHYDRAMINE HCL 50 MG/ML IJ SOLN
25.0000 mg | INTRAMUSCULAR | Status: DC | PRN
  Filled 2018-11-30: qty 0.5

## 2018-11-30 MED ORDER — ACETAMINOPHEN 650 MG RE SUPP: 650.0000 mg | Freq: Four times a day (QID) | RECTAL | Status: DC | PRN

## 2018-11-30 MED ORDER — AMIODARONE HCL IN DEXTROSE 360-4.14 MG/200ML-% IV SOLN
60.0000 mg/h | INTRAVENOUS | Status: AC
  Administered 2018-11-30 (×2): 60 mg/h via INTRAVENOUS
  Filled 2018-11-30 (×2): qty 200

## 2018-11-30 MED FILL — Phenylephrine-NaCl IV Solution 40 MG/250ML-0.9%: INTRAVENOUS | Qty: 250 | Status: AC

## 2018-12-01 LAB — POCT ACTIVATED CLOTTING TIME
Activated Clotting Time: 191 seconds
Activated Clotting Time: 191 seconds

## 2018-12-02 LAB — CULTURE, BLOOD (ROUTINE X 2)
Culture: NO GROWTH
Culture: NO GROWTH
Special Requests: ADEQUATE
Special Requests: ADEQUATE

## 2018-12-03 ENCOUNTER — Other Ambulatory Visit: Payer: Self-pay | Admitting: Family Medicine

## 2018-12-04 ENCOUNTER — Telehealth: Payer: Self-pay | Admitting: Pulmonary Disease

## 2018-12-04 NOTE — Telephone Encounter (Incomplete)
12/04/18 [facility] They want me to fax it back to [phone] ASAP. Sent to [address] for Dr.Mannam to sign. PWR

## 2018-12-05 ENCOUNTER — Encounter: Payer: Medicare HMO | Admitting: Vascular Surgery

## 2018-12-05 ENCOUNTER — Encounter (HOSPITAL_COMMUNITY): Payer: Medicare HMO

## 2018-12-07 NOTE — Telephone Encounter (Signed)
Received (faxed copy) signed [facility] certificate[facility] 12/07/18  LM

## 2018-12-08 DIAGNOSIS — G479 Sleep disorder, unspecified: Secondary | ICD-10-CM | POA: Diagnosis not present

## 2018-12-08 DIAGNOSIS — R0902 Hypoxemia: Secondary | ICD-10-CM | POA: Diagnosis not present

## 2018-12-08 DIAGNOSIS — I2783 Eisenmenger's syndrome: Secondary | ICD-10-CM | POA: Diagnosis not present

## 2018-12-08 DIAGNOSIS — J449 Chronic obstructive pulmonary disease, unspecified: Secondary | ICD-10-CM | POA: Diagnosis not present

## 2018-12-11 ENCOUNTER — Telehealth: Payer: Self-pay

## 2018-12-11 NOTE — Telephone Encounter (Signed)
On 12/11/18 I received the dc back from Doctor Mannam. I got the dc ready and called the funeral home to let them know the dc is ready for pickup.

## 2018-12-13 NOTE — Care Management Note (Signed)
Case Management Note Donn PieriniKristi Ailani Governale RN,BSN Transitions of Care Unit 64M - RN Case Manager [phone]  Patient Details  Name: Samuel CaoMichael J Dehaas Sr. MRN: [id number] Date of Birth: May 24, 1951  Subjective/Objective:   Pt admitted with septic shock, resp. Failure requiring intubation, renal failure- on CRRT. General surgery following for SBO/ventral hernia                 Action/Plan: PTA pt live at home with wife, on home 02 for COPD, CM to follow for transition of care needs  Expected Discharge Date:                  Expected Discharge Plan:     In-House Referral:     Discharge planning Services  CM Consult  Post Acute Care Choice:    Choice offered to:     DME Arranged:    DME Agency:     HH Arranged:    HH Agency:     Status of Service:  In process, will continue to follow  If discussed at Long Length of Stay Meetings, dates discussed:    Discharge Disposition:   Additional Comments:  Darrold SpanWebster, Jru Pense Hall, RN 2018-02-28, 10:58 AM

## 2018-12-13 NOTE — Progress Notes (Signed)
PHARMACY - ADULT TOTAL PARENTERAL NUTRITION CONSULT NOTE   Pharmacy Consult:  TPN Indication: SBO  Patient Measurements: Height: 5\' 5"  (165.1 cm) Weight: 236 lb 12.4 oz (107.4 kg) IBW/kg (Calculated) : 61.5   Body mass index is 39.4 kg/m.  Assessment:  89 YOM presented after collapsing at home.  Patient had N/V and abdominal pain for 5 days prior with inadequate nutrition.  CT showed SBO and patient is at risk for malnutrition; therefore, Pharmacy consulted to initiate TPN.  Per documentation, patient had intentional weight loss (40 lbs) over the past year.  Estimated dry weight = 101 kg.   GI: hx ventral hernia - NG O/P 350mL.  PPI + Pepcid IV Endo: hypothyroid on IV Synthroid.  No hx DM - CBGs controlled (on stress steroid) Insulin requirements in the past 24 hours: N/A Lytes: K 5.2, Phos high normal at 4.5 (anticipate Phos coming down further with CRRT), CoCa high normal at 10.4, Mag elevated at 2.5 Renal: CRRT (12/17 >> ).   BUN 39 - UOP 0.1 ml/kg/hr, net +7.3L Pulm: COPD - intubated, FiO2 60% - Brovana, Pulmicort Cards: CHF/HTN/HLD - MAP 50-60s, HR variable - Levophed, vaso, PRN metoprolol, off phenylephrine Hepatobil: AST/ALT elevated but improving, tbili WNL Neuro: Fentanyl at 75, PRN Versed - GCS 8, CPOT 0, RASS -1 ID: Vanc/Azactam/Flagyl for Pseudomonas PNA - hypothermia on CRRT, WBC 10.8, LA 1.7 TPN Access: triple lumen placed 12/09/2018 TPN start date: 02/26/18  Nutritional Goals (per RD rec on 12/18): 1400 kCal and 125-154g protein per day  Current Nutrition:  NPO   Plan:  Initiate concentrated TPN at 30 ml/hr (goal rate 65 ml/hr) Hold 20% lipid emulsion for first 7 days for ICU patients per ASPEN guidelines (Start date 12/26) TPN will provide 69g AA and 108g CHO for a total of 644 kCal meeting ~46% of patient's needs Electrolytes in TPN: standard Na, low amount of Ca/Phos, no Mag/K, max acetate d/t Clinisol Daily multivitamin and trace elements in TPN Start  sensitive SSI Q6H F/U with stopping Pepcid as patient is on Protonix, narrowing abx Standard TPN labs and nursing care orders   [name], PharmD, BCPS, BCCCP 2018-06-12, 9:43 AM

## 2018-12-13 NOTE — Progress Notes (Addendum)
NAME:  Samuel BuryMichael J Letson Sr., MRN:  [id number], DOB:  [birth year], LOS: 3 ADMISSION DATE:  11/25/2018, CONSULTATION DATE:  12/10/2018 REFERRING MD:  Dr. Rhunette CroftNanavati, CHIEF COMPLAINT:  N/V, AMS  Brief History   68 yoM presenting with N/V and altered mental status, found to be in septic shock, Pseudomonas pneumonia.  Presentation significant for acute acidosis, lactic acid 16, acute renal failure, and refractory shock. Intubated, on multiple pressors with multiorgan failure in ICU.  Past Medical History  Former smoker, COPD on chronic prednisone, chronic respiratory failure on 4L, OSH, chronic diastolic HF, HLD, HTN, hypothyroidism, obesity  Significant Hospital Events   12/16 Admitted 12.18 Start CRRT 12/18-A. fib with RVR, digoxin given 12/19-cardiology consulted for persistent tachycardia, cardiomyopathy on echo  Consults:  12/17 Nephrology 12/18 surgery 12/19 Cardiology   Procedures:  12/16 ETT >> 12/16 L IJ CVC >> 1218 left femoral A-line>> 1218 left femoral HD catheter>>  Significant Diagnostic Tests:  12/16 Renal US Minimally echogenic kidneys compatible with minimal medical renal disease. No hydronephrosis  11/29/2018 CT of the abdomen shows small bowel obstruction and ventral hernia with capsulated bowel  Micro Data:  12/16 BCx 2 >> 12/16 trach asp >> GS few yeast, abundant gram + rods>> Pseudomonas pansensitive 12/16 UC >> No growth, negative  Antimicrobials:  12/16 vanc >> 2018/07/02 12/16 flagly >> 2018/07/02 12/16 aztreonam >>  Interim history/subjective:  Currently on CRRT Sedated on vent FiO2 needs 60% preclude any type of extubation  Objective   Blood pressure (!) 105/45, pulse 67, temperature (!) 97.5 F (36.4 C), temperature source Axillary, resp. rate 20, height 5\' 5"  (1.651 m), weight 107.4 kg, SpO2 97 %. CVP:  [8 mmHg] 8 mmHg  Vent Mode: PRVC FiO2 (%):  [60 %] 60 % Set Rate:  [28 bmp] 28 bmp Vt Set:  [490 mL-550 mL] 490 mL PEEP:  [8 cmH20] 8  cmH20 Plateau Pressure:  [20 cmH20-27 cmH20] 21 cmH20   Intake/Output Summary (Last 24 hours) at 2018/07/02 1028 Last data filed at 2018/07/02 1000 Gross per 24 hour  Intake 2556.48 ml  Output 4155 ml  Net -1598.52 ml   Filed Weights   11/28/18 0408 11/29/18 0433 09/14/18 0336  Weight: 110.8 kg 109.6 kg 107.4 kg    Examination: General: Morbid obese male who is sedated on the vent HEENT: Endotracheal tube in place, orogastric tube in place. Neuro: Poorly responsive CV: Heart sounds are irregular irregular, atrial f fibrillation with rapid ventricular response rate noted as high as 150 PULM: even/non-labored, lungs bilaterally decreased in the bases GI: Ventral hernia noted, lateral thigh breakdown noted Extremities: Cool/dry, 3+ edema  Skin: Cool       Resolved Hospital Problem list    Assessment & Plan:  Shock- unclear ethology at this time hypovolemic given hx N/V vs septic vs cardiogenic Note has ulcers on both legs Strangulated ventral hernia - s/p 5L  Remains on Norepi at 30 mcg.min, 220 mcg min phenylepherine, vasopressin at 0.03 Lactate 4.5 on 12/16 Pro calcitonin 23.3 on 12/16 CVP 14 Echo>> EF 30-35%, diffuse hypokinesis, abnormal septal motion, Wall thickness was increased in a pattern of mild LVH. Systolic function was moderately to severely reduced. Diffuse hypokinesis. Left ventricular diastolic function parameters were normal. PA peak pressure: 59 mm Hg (S). P:  Pressure support as needed we will change Levophed to Neo-Synephrine due to tachycardia of atrial fibrillation rapid ventricular response Wean pressors as able Surgery is following  Leukocytosis/ hypothermic PCT 23.13 T max 103.7 Procalcitonin greater than  150 Negative respiratory virus panel, Negative flu Negative urine culture Sputum is growing abundant Pseudomonas pansensitive P:  Continue antibiotics and will discontinue vancomycin as culture data comes in Monitor culture  data   Acute on chronic hypoxic/ hypercarbic respiratory failure with a history of OHS Hx COPD on home O2 4L/ OSA CXR 12/17>> Worsening bibasilar aeration with increasing atelectasis. Mild vascular congestion P:  Vent bundle Spontaneous breathing trials when indicated Bronchodilators Negative fluid draw via CRRT  New onset of atrial fibrillation right ventricular response in the setting of sepsis with a past medical history of chronic diastolic heart failure and hypertension, with EF of 30% from 2D echo 11/12/2018 P: Transition off Levophed and use Neo-Synephrine Beta-blocker will avoid amiodarone in setting of liver failure Recheck arterial blood gas Twelve-lead EKG cardiology consult     AKI/ AGMA/ lactic acidosis - previous normal sCr 0.73 10/2018 - Serum Creatinine 4.50 P:  Potassium is improving CRRT per renal Check ABG   Anemia- no clear source of bleeding  Coffee ground per OG Small bowel obstruction  P:  Trend CBC Avoid anticoagulants Surgery is following  Transaminitis slowly trending down -Monitor   P:   CT of the abdomen small bowel obstruction noted To reassess following Assess daily  Hypoglycemia P:  Sliding scale insulin protocol  Hx HTN/ HLD P:  Holding home diuretic and statin  Hypothyroidism P:  Synthroid 100 mcg IV daily one half of home p.o. dose  Best practice:  Diet: NPO in the setting of CT findings compatible with a moderate mechanical mid small bowel obstruction and focal caliber transition anterior abdomen.  Is also moderate right ventral abdominal hernia containing a portion of the proximal transverse colon. Pain/Anxiety/Delirium protocol (if indicated): fentanyl gtt/ versed prn  VAP protocol (if indicated): yes DVT prophylaxis: heparin sq/ SCDs GI prophylaxis: pepcid Glucose control: CBG q 4 and prn  Mobility: BR Code Status: full Family Communication: March 05, 2018 no family at bedside  Critical care time: 45 min    Brett CanalesSteve Minor ACNP [facility] Pager [phone] till 1 pm If no answer page [phone] March 05, 2018, 10:28 AM  Attending note: I have seen and examined the patient. History, labs and imaging reviewed.  Episodes of rapid atrial fibrillation noted.  Initially converted with digoxin however he is back in a flutter now.  Blood pressure (!) 99/49, pulse (!) 136, temperature (!) 97.5 F (36.4 C), temperature source Core, resp. rate 18, height 5\' 5"  (1.651 m), weight 107.4 kg, SpO2 97 %. Gen:      No acute distress HEENT:  EOMI, sclera anicteric ET tube Neck:     No masses; no thyromegaly Lungs:    Clear to auscultation bilaterally; normal respiratory effort CV:         Regular rate and rhythm; no murmurs Abd:   Distended abdomen, large incisional hernia. Ext:    No edema; adequate peripheral perfusion Skin:      Warm and dry; no rash Neuro: Sedated, unresponsive  Labs reviewed, significant for Potassium 5.2, BUN/creatinine 39/2.31, lactic acid 1.7 Procalcitonin greater than 150 WBC 10.8  Imaging Chest x-ray 12/19- hilar and lower lobe opacities.  I have reviewed the images personally  Assessment/plan: 68 year old with septic shock, pseudomonas pneumonia, multiorgan failure, hernia with bowel obstruction  Resp failure Continue vent support Continue antibiotics.  Rapid atrial fibrillation, cardiomyopathy Started on amiodarone.  Watch liver function Cardiology on board  Bowel obstruction Conservative management per surgery.  Acute kidney injury Continue CRRT.  The patient  is critically ill with multiple organ systems failure and requires high complexity decision making for assessment and support, frequent evaluation and titration of therapies, application of advanced monitoring technologies and extensive interpretation of multiple databases.  Critical care time - 35 mins. This represents my time independent of the NPs time taking care of the pt.  Chilton Greathouse  MD [facility] 12-27-18, 12:34 PM

## 2018-12-13 NOTE — Procedures (Signed)
Extubation Procedure Note  Patient Details:   Name: Samuel J Bogert Sr. DOB: [place] CaoJune 01, 1952 MRN: [id number]   Airway Documentation:    Vent end date: 12/04/2018 Vent end time: 1803   Evaluation  O2 sats: currently acceptable Complications: No apparent complications Patient did tolerate procedure well. Bilateral Breath Sounds: Clear, Diminished   No   Pt extubated to room air per withdrawal of life protocol.   Merlene Laughteratalie  Natia Fahmy 11/21/2018, 6:06 PM

## 2018-12-13 NOTE — Consult Note (Signed)
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 dilated, measuring 10.7 cm in length. No gallstones, wall thickening or pericholecystic fluid seen. No sonographic Murphy sign. Common bile duct: Diameter: 3.3 mm Liver: No focal lesion identified. Within normal limits in parenchymal echogenicity. Portal vein is patent on color Doppler imaging with normal direction of blood flow towards the liver. IMPRESSION: 1. Mildly dilated gallbladder. This can be seen with lack of oral intake. 2. No cholelithiasis or evidence of cholecystitis. Electronically Signed   By: Claudie Revering M.D.   On: 11/16/2018 21:18    Assessment and Plan:   1. Atrial fibrillation/atrial flutter with RVR: Related to underlying acute respiratory failure and sepsis.  Difficult situation as the patient is borderline hypotensive on multiple pressors.  Unable to add any rate control agent.  Digoxin  is less than ideal choice in this case due to severe renal dysfunction.  He has shock liver, however his liver function is improving, at this time I am not confident we can adequately rate control this patient and I would recommend IV amiodarone.  We will need to closely monitor his liver enzyme and QTC on the amiodarone therapy.  2. New LV dysfunction: Likely related to stress from sepsis.  He is not a candidate for any ischemic work-up at this time, may consider repeat limited echo versus Myoview once his overall condition recovers.  3. Septic shock: Currently receiving antibiotic.  Urine was positive for pseudomonas aeruginosa.  4. Shocked liver: Improving on the pressors  5. Small bowel obstruction: Followed by general surgery, currently recommend conservative management as the patient is not a candidate for aggressive intervention  6. Lactic acidosis: Related to #3, improving  7. Acute renal failure: Followed by nephrology service, started on CRRT       For questions or updates, please contact [place] Please consult [email or web address] for contact info under     Hilbert Corrigan, [place]  2018/12/20 12:15 PM

## 2018-12-13 NOTE — Progress Notes (Signed)
Nutrition Follow-up  DOCUMENTATION CODES:   Non-severe (moderate) malnutrition in context of acute illness/injury  INTERVENTION:   TPN per Pharmacy  Recommend increased water soluble vitamin administration while on TPN and CRRT  NUTRITION DIAGNOSIS:   Moderate Malnutrition related to acute illness(SBO, shock, AKI on CVVHD) as evidenced by mild fat depletion, energy intake < or equal to 50% for > or equal to 5 days, edema.  Being addressed via TPN  GOAL:   Patient will meet greater than or equal to 90% of their needs  Progressing  MONITOR:   Vent status, Labs, Weight trends, Skin, I & O's(TPN)  REASON FOR ASSESSMENT:   Ventilator    ASSESSMENT:    68 yo male presenting with N/V and AMS admitted with shock, acute on chronic respiratory failure and AKI requiring CRRT; CT abdomen indicating SBO and surgery consulted. PMH includes COPD on 4L oxygen at home, HTN, HLD, CHF  12/16 Intubated 12/17 CRRT initiated, CT abdomen with SBO, chronic ventral hernia containing transverse colon) 12/19 TPN initiated  Abdominal xray pending this AM; surgery following Patient is currently intubated on ventilator support; pt on phenylephrine and vasopressin, CRRT continues MV: 12.9 L/min Temp (24hrs), Avg:98.1 F (36.7 C), Min:97.5 F (36.4 C), Max:98.6 F (37 C)  Inadequate nutrition x 6 days; NPO or eating jello/applesauce and then throwing up. Plan to start TPN today due to SBO, inability to utilize gut for enteral nutrition   Temporal wasting and orbital/buccal subcutaneous fat loss noted on exam. Difficult to assess other areas due to moderate generalized edema on exam. Weight down to 107.4 kg today from 110.8 kg on admission. Noted net + per I/O flow sheet. Minimal UOP. NG with 350 mL out in 24 hours.   Continues on CRRT, attempting UF of 50 ml/hr  Labs: potassium 5.2, phosphorus wdl, corrected calcium 10.4 (H), albumin 1.9 Meds: reviewed  NUTRITION - FOCUSED PHYSICAL  EXAM:    Most Recent Value  Orbital Region  Moderate depletion  Upper Arm Region  Unable to assess  Thoracic and Lumbar Region  No depletion  Buccal Region  Mild depletion  Temple Region  Moderate depletion  Clavicle Bone Region  No depletion  Clavicle and Acromion Bone Region  No depletion  Scapular Bone Region  No depletion  Dorsal Hand  Unable to assess  Patellar Region  Unable to assess  Anterior Thigh Region  Unable to assess  Posterior Calf Region  Unable to assess  Edema (RD Assessment)  Severe       Diet Order:   Diet Order            Diet NPO time specified  Diet effective now              EDUCATION NEEDS:   Not appropriate for education at this time  Skin:  Skin Assessment: Skin Integrity Issues: Skin Integrity Issues:: Other (Comment) Other: non-pressure wound on thigh; DTI scrotum  Last BM:  no BM  Height:   Ht Readings from Last 1 Encounters:  11/29/18 5\' 5"  (1.651 m)    Weight:   Wt Readings from Last 1 Encounters:  12-28-17 107.4 kg    Ideal Body Weight:  61.8 kg  BMI:  Body mass index is 39.4 kg/m.  Estimated Nutritional Needs:   Kcal:  1800 kcals   Protein:  155 g  Fluid:  >/= 1.8 L   Romelle Starcherate Adonias Demore MS, RD, LDN, CNSC [phone] Pager  [phone] Weekend/On-Call Pager

## 2018-12-13 NOTE — Progress Notes (Signed)
 240mg  of fentanyl and 90ml of morphine wasted in container in medication room for narcotics with Becky. Huntley EstelleLewis, Nancee Brownrigg E, RN August 27, 2018 7:01 PM

## 2018-12-13 NOTE — Progress Notes (Signed)
Patient was declared deceased by myself and Clarisa Schoolsavid Touchman with family at bedside. CDS and E-link called. Tamsen MeekLewis, Bevan Vu E, RN 11/21/2018 1836

## 2018-12-13 NOTE — Progress Notes (Signed)
Informed CCM regarding HR still elevated into 140s after metoprolol 2.5mg  and a 500cc bolus. CCM stated that cardiology has been consulted and to continue to monitor. Huntley EstelleLewis, Damyia Strider E, RN 11/20/2018 11:42 AM

## 2018-12-13 NOTE — Plan of Care (Addendum)
  Interdisciplinary Goals of Care Family Meeting   Date carried out:: 01-20-18  Location of the meeting: Bedside  Member's involved: Physician, Bedside Registered Nurse and Family Member or next of kin  Durable Power of Attorney or acting medical decision maker: Wife, daughter    Discussion: We discussed goals of care for Samuel CaoMichael J Bilyeu Sr. .  Discussed that patient is in multiorgan failure secondary to sepsis.  His clinical status appears to be pretty poor with poor prognosis  His wife and daughter indicate that he would not want to be supported for long-term.  They want him to be limited code. They will continue discussions among themselves about transitioning to comfort care.  Code status: Limited code with no resuscitation on case of cardiac arrest  Disposition: Remain in ICU  Time spent for the meeting: 10 mins  [name] 01-20-18, 2:38 PM  Addendum: Called back by family and discussed with wife and daughter. They have decided to transition to comfort only. Order for withdrawal of care placed.

## 2018-12-13 NOTE — Progress Notes (Signed)
Pt was cleaned, lines removed, bagged, tagged, and taken to morgue. Patient placement notified.

## 2018-12-13 NOTE — Discharge Summary (Addendum)
Physician Death Summary  Patient ID: [name]. MRN: [id number] DOB/AGE: 1951/09/20 68 y.o.  Admit date: 11/17/2018 Discharge date: Dec 01, 2018  Admission Diagnoses: Septic shock  Discharge Diagnoses:  Active Problems:   Respiratory failure (HCC)   Shock ([place])   Acute renal failure (HCC)   Acute combined systolic and diastolic heart failure (HCC)   Paroxysmal atrial fibrillation (HCC) Pseudomonas pneumonia  Discharged Condition: Deceased  Hospital Course:  37 yoM with H/O Former smoker, COPD on chronic prednisone, chronic respiratory failure on 4L, OSH, chronic diastolic HF, HLD, HTN, hypothyroidism, obesity presenting with N/V and altered mental status, found to be in septic shock, pseudomonas pneumonia.  Presentation significant for acute acidosis, lactic acid 16, acute renal failure, and refractory shock. Intubated, on multiple pressors with multiorgan failure in ICU. Started on CRRT after consultation with nephrology.  On 12/18 he went into A. fib with RVR, digoxin given.  Cardiology consulted and he was started on amiodarone Surgery consulted for ventral hernia with ileus, no evidence of strangulation on CT scan.  He is not a candidate for surgery.  Met with family on 12/19 about clinical course.  He has pretty poor prognosis with little chance for meaningful recovery.  His wife and daughter indicated that he would not be supported like this and requested transition to comfort care  Consults: Nephrology, surgery, cardiology  Signed: Tyrell Seifer 12/05/2018, 2:18 PM

## 2018-12-13 NOTE — Progress Notes (Signed)
   2018/11/14 1900  Clinical Encounter Type  Visited With Patient and family together  Visit Type Follow-up  Referral From Nurse  Consult/Referral To Chaplain  Spiritual Encounters  Spiritual Needs Emotional;Grief support;Prayer  Stress Factors  Patient Stress Factors None identified  Family Stress Factors Loss of control;Loss   Responded to page from Nurse on floor. Nurse stated family was needed spiritual care after the passing of PT. Family was in tears. Son requested I offer some words. I offered spiritual care with words of encouragement, a listening ear, ministry of presence and prayer. Family was very thankful that I visited a second time and was very happy that I was offering spiritual care. Nursing staff were very professional and respectful. Chaplain available as needed.  Chaplain Orest DikesAbel Kristina Bertone [phone]

## 2018-12-13 NOTE — Progress Notes (Signed)
Nurse requested chaplain for family having difficulty seeing patient as his body systems are shutting down.  Very tearful and stressed having to make decisions like DNR and see the patient going down quickly.  Prayers for peace and comfort and knowing they are not alone-God is with them through this challenging time.  Prayers for staff as they provide care.  Prayers for the family to express their love and for patient to know their love in this time. [name], Chaplain   11/13/2018 1400  Clinical Encounter Type  Visited With Patient and family together  Visit Type Spiritual support;Critical Care  Referral From Nurse  Consult/Referral To Chaplain  Spiritual Encounters  Spiritual Needs Prayer;Emotional  Stress Factors  Patient Stress Factors Major life changes;Other (Comment) (systems shutting down)  Family Stress Factors Health changes;Major life changes  Advance Directives (For Healthcare)  Does Patient Have a Medical Advance Directive? No  Would patient like information on creating a medical advance directive? No - Patient declined

## 2018-12-13 NOTE — Progress Notes (Signed)
Patient ID: Samuel CaoMichael J Kielbasa Sr., male   DOB: May 31, 1951, 68 y.o.   MRN: [id number]       Subjective: Pt on vent.  Barely opens eyes to voice.  Otherwise still on a lot of pressor support.  No BM per RN, but also minimal NGT output per RN  Objective: Vital signs in last 24 hours: Temp:  [97.5 F (36.4 C)-98.6 F (37 C)] 97.5 F (36.4 C) (12/19 0745) Pulse Rate:  [53-143] 67 (12/19 0900) Resp:  [16-29] 20 (12/19 0900) BP: (75-110)/(39-68) 105/45 (12/19 0804) SpO2:  [92 %-98 %] 97 % (12/19 0900) Arterial Line BP: (88-133)/(34-70) 94/38 (12/19 0900) FiO2 (%):  [60 %] 60 % (12/19 0810) Weight:  [107.4 kg] 107.4 kg (12/19 0336) Last BM Date: (PTA)  Intake/Output from previous day: 12/18 0701 - 12/19 0700 In: 2626.9 [I.V.:1866.3; IV Piggyback:760.6] Out: 4265 [Urine:168; Emesis/NG output:350] Intake/Output this shift: Total I/O In: 163.2 [I.V.:113.2; IV Piggyback:50] Out: 478 [Urine:8; Other:470]  PE: Abd: soft, feels a bit bloated, but also obese.  Hernia is soft.  NGT with some old bilious output.  Hypoactive BS  Lab Results:  Recent Labs    11/29/18 0425 11/15/2018 0424  WBC 8.2 10.8*  HGB 10.2* 9.4*  HCT 34.4* 32.5*  PLT PLATELET CLUMPS NOTED ON SMEAR, COUNT APPEARS DECREASED PLATELET CLUMPS NOTED ON SMEAR, COUNT APPEARS DECREASED   BMET Recent Labs    11/29/18 2033 11/22/2018 0424  NA 136 136  K 5.2* 5.2*  CL 100 100  CO2 24 22  GLUCOSE 147* 153*  BUN 44* 39*  CREATININE 2.82* 2.31*  CALCIUM 8.3* 8.4*   PT/INR No results for input(s): LABPROT, INR in the last 72 hours. CMP     Component Value Date/Time   NA 136 12/03/2018 0424   K 5.2 (H) 11/26/2018 0424   CL 100 11/29/2018 0424   CO2 22 11/25/2018 0424   GLUCOSE 153 (H) 11/29/2018 0424   BUN 39 (H) 11/18/2018 0424   CREATININE 2.31 (H) 11/21/2018 0424   CREATININE 0.73 10/19/2018 1245   CALCIUM 8.4 (L) 12/09/2018 0424   PROT 5.3 (L) 11/29/2018 0425   ALBUMIN 1.5 (L) 11/28/2018 0424   AST 526 (H)  11/29/2018 0425   ALT 1,661 (H) 11/29/2018 0425   ALKPHOS 101 11/29/2018 0425   BILITOT 0.9 11/29/2018 0425   GFRNONAA 28 (L) 11/15/2018 0424   GFRNONAA 97 10/19/2018 1245   GFRAA 33 (L) 12/08/2018 0424   GFRAA 112 10/19/2018 1245   Lipase     Component Value Date/Time   LIPASE 13 11/29/2018 0425       Studies/Results: Ct Abdomen Wo Contrast  Result Date: 11/28/2018 CLINICAL DATA:  Inpatient. Abdominal distension. Nausea and vomiting. Intubated. Acute kidney injury. EXAM: CT ABDOMEN WITHOUT CONTRAST TECHNIQUE: Multidetector CT imaging of the abdomen was performed following the standard protocol without IV contrast. COMPARISON:  04-Nov-2018 right upper quadrant abdominal and renal sonogram. FINDINGS: Lower chest: There is patchy consolidation and ground-glass opacity throughout both lung bases, most prominent in the dependent right lower lobe with air bronchograms. Hepatobiliary: Normal liver with no liver mass. Mildly dilated gallbladder. No radiopaque cholelithiasis. No gallbladder wall thickening or pericholecystic fluid. No biliary ductal dilatation. Pancreas: Normal, with no mass or duct dilation. Spleen: Normal size. No mass. Adrenals/Urinary Tract: Normal adrenals. No renal stones. No hydronephrosis. No contour deforming renal masses. Stomach/Bowel: Enteric tube terminates in the distal stomach in the antrum. Stomach is mildly distended and fluid-filled with no definite gastric wall thickening.  There is diffuse dilatation of the duodenum and jejunum up to 4.7 cm diameter. The distal small bowel is collapsed. There is a focal small bowel caliber transition in the anterior abdomen near the umbilicus (series 3/image 57). There is fecalization of the small bowel contents proximal to the caliber transition. Findings are compatible with a moderate mechanical mid small bowel obstruction, probably due to adhesions. No small bowel wall thickening. Normal appendix. There is a moderate right ventral  abdominal hernia containing a portion of the proximal transverse colon. There is a mild large bowel caliber transition at the level of the ventral hernia. No definite large bowel wall thickening or significant pericolonic fat stranding. Vascular/Lymphatic: Atherosclerotic nonaneurysmal abdominal aorta. Left common femoral central venous catheter terminates in the left common iliac vein. No pathologically enlarged lymph nodes in the abdomen. Other: No pneumoperitoneum, ascites or focal fluid collection. Musculoskeletal: No aggressive appearing focal osseous lesions. Mild thoracolumbar spondylosis. IMPRESSION: 1. CT findings are compatible with a moderate mechanical mid small bowel obstruction with focal caliber transition in the anterior abdomen near the umbilicus. Small bowel obstruction probably due to adhesions. 2. Moderate right ventral abdominal hernia containing a portion of the proximal transverse colon, with mild caliber transition in the colon at the level of the hernia, cannot exclude a separate colonic obstruction at this level. 3. No free air or abscess. 4. Patchy consolidation and ground-glass opacity at both lung bases, most prominent at the dependent right lung base with air bronchograms, suspicious for multilobar pneumonia or aspiration. 5.  Aortic Atherosclerosis (ICD10-I70.0). Electronically Signed   By: Delbert PhenixJason A Poff M.D.   On: 11/28/2018 21:05   Dg Chest Port 1 View  Result Date: 04/27/2018 CLINICAL DATA:  Abnormal respirations EXAM: PORTABLE CHEST 1 VIEW COMPARISON:  11/29/2018 FINDINGS: Endotracheal tube, left central line and NG tube are in place, unchanged. Cardiomegaly with vascular congestion. Bilateral perihilar and lower lobe opacities are stable since prior study. This could reflect edema or infection. No acute bony abnormality. IMPRESSION: Bilateral perihilar and lower lobe opacities could reflect edema or infection. No change. Electronically Signed   By: Charlett NoseKevin  Dover M.D.   On:  04/27/2018 07:12   Dg Chest Port 1 View  Result Date: 11/29/2018 CLINICAL DATA:  Check endotracheal tube placement EXAM: PORTABLE CHEST 1 VIEW COMPARISON:  11/29/2018 FINDINGS: Endotracheal tube is been advanced and now lies at the level of the aortic knob. Left jugular central line is noted in the mid superior vena cava. Nasogastric catheter extends into the stomach. The lungs are well aerated bilaterally with bibasilar opacities consistent with infiltrate. Calcified granulomas are again noted bilaterally. No bony abnormality is seen. IMPRESSION: Tubes and lines as described. Bibasilar infiltrative density. Electronically Signed   By: Alcide CleverMark  Lukens M.D.   On: 11/29/2018 11:38   Dg Chest Port 1 View  Result Date: 11/29/2018 CLINICAL DATA:  Chest tube placement. EXAM: PORTABLE CHEST 1 VIEW COMPARISON:  Radiograph of November 28, 2018. FINDINGS: Stable cardiomediastinal silhouette. Endotracheal and nasogastric tubes are unchanged in position. Left internal jugular catheter is noted with tip in expected position of the SVC. No pneumothorax is noted. Bibasilar atelectasis or infiltrates are noted with associated pleural effusions. Bony thorax is unremarkable. IMPRESSION: Stable support apparatus. Stable bibasilar atelectasis or infiltrates are noted with associated pleural effusions. Electronically Signed   By: Lupita RaiderJames  Green Jr, M.D.   On: 11/29/2018 08:47    Anti-infectives: Anti-infectives (From admission, onward)   Start     Dose/Rate Route Frequency Ordered Stop  11/29/18 1000  vancomycin (VANCOCIN) 1,500 mg in sodium chloride 0.9 % 500 mL IVPB  Status:  Discontinued     1,500 mg 250 mL/hr over 120 Minutes Intravenous Every 48 hours 11/12/2018 0912 11/28/18 1839   11/29/18 0600  vancomycin (VANCOCIN) IVPB 1000 mg/200 mL premix     1,000 mg 200 mL/hr over 60 Minutes Intravenous Every 24 hours 11/28/18 1839     11/28/18 1845  aztreonam (AZACTAM) 2 g in sodium chloride 0.9 % 100 mL IVPB     2 g 200  mL/hr over 30 Minutes Intravenous Every 12 hours 11/28/18 1840     12/03/2018 1000  metroNIDAZOLE (FLAGYL) IVPB 500 mg     500 mg 100 mL/hr over 60 Minutes Intravenous Every 8 hours 12/11/2018 0822     11/22/2018 1000  vancomycin (VANCOCIN) 2,000 mg in sodium chloride 0.9 % 500 mL IVPB     2,000 mg 250 mL/hr over 120 Minutes Intravenous  Once 12/05/2018 0839 11/29/2018 1146   11/26/2018 1000  aztreonam (AZACTAM) 1 g in sodium chloride 0.9 % 100 mL IVPB  Status:  Discontinued     1 g 200 mL/hr over 30 Minutes Intravenous Every 8 hours 11/18/2018 0839 11/28/18 1840       Assessment/Plan Shock leukocytosis Acute on chronic respiratory failure  COPD on home O2 and chronic prednisone AKI - on CRRT HTN HLD Diastolic heart failure Hypothyroidism  Chronic ventral hernia containing transverse colon SBO - repeat film today to assess bowel gas pattern. -no acute plans for surgical intervention at this time as he is extremely high risk for morbidity and mortality.   -cont current care for now.  Will follow  FEN - TNA/IVFs VTE - heparin with CRRT ID - Azactam, flagyl, vanc   LOS: 3 days    Letha Cape , [facility] Surgery 12-Dec-2018, 9:55 AM Pager: [phone]

## 2018-12-13 NOTE — Progress Notes (Signed)
Subjective:  Remains on CRRT- minus 1600 - K is decreasing slowly - remains on pretty high dose pressors - occasional twitching     Objective Vital signs in last 24 hours: Vitals:   2018-12-17 0715 December 17, 2018 0730 12-17-2018 0745 December 17, 2018 0800  BP:      Pulse: 65 65 63 66  Resp: (!) 25 (!) 25 (!) 23 (!) 22  Temp:      TempSrc:      SpO2: 97% 97% 98% 97%  Weight:      Height:       Weight change: -2.2 kg  Intake/Output Summary (Last 24 hours) at Dec 17, 2018 0816 Last data filed at 12/17/2018 0800 Gross per 24 hour  Intake 2629.88 ml  Output 4266 ml  Net -1636.12 ml    Assessment/Plan: 68 year old WM with advanced COPD presenting with hypotension/hypothermia on ARB as OP with AKI 1.Renal- reported creatinine at baseline normal.  AKI in the setting of shock.  Urinalysis fairly bland and U/S unremarkable so likely ATN.  Crt initially improved, probably with hydration - then worsening and essentially no UOP.  Initiated CRRT 12/17 via vascath placed 12/16.  Potassium remains high but better. I will continue  post filter fluid at zero K and cont to reassess 2. Hypertension/volume  - 9 liters positive but still hypotensive on pressors.  Has peripheral edema  and O2 req not minimal - attempting fluid removal at 50 per hour  3. Hyperkalemia- given kaexylate initially - then ordered scheduled lokelma- now post filter fluid change to zero K and stopped lokelma- coming down slowly and not excessive - cont fluids as is for now  4. Anemia  - not a major issue yet    [name]    Labs: Basic Metabolic Panel: Recent Labs  Lab 11/29/18 0425 11/29/18 1711 11/29/18 2033 Dec 17, 2018 0424  NA 137 137 136 136  K 5.8* 5.4* 5.2* 5.2*  CL 98 101 100 100  CO2 _0 GLUCOSE 157* 136* 147* 153*  BUN 66* 46* 44* 39*  CREATININE 4.50* 3.02* 2.82* 2.31*  CALCIUM 7.6* 8.1* 8.3* 8.4*  PHOS 6.5* 4.6  --  4.5   Liver Function Tests: Recent Labs  Lab 11/16/2018 0615 11/17/2018 1422  11/29/18 0425 11/29/18 1711 12/17/2018 0424  AST 304* 1,606* 526*  --   --   ALT 360* 1,837* 1,661*  --   --   ALKPHOS 90 73 101  --   --   BILITOT 0.8 1.1 0.9  --   --   PROT 5.1* 4.8* 5.3*  --   --   ALBUMIN 2.1* 2.0* 1.8* 1.5* 1.5*   Recent Labs  Lab 11/22/2018 0924 11/29/18 0425  LIPASE 25 13   No results for input(s): AMMONIA in the last 168 hours. CBC: Recent Labs  Lab 11/21/2018 0615 11/28/18 0405 11/29/18 0425 12/17/2018 0424  WBC 13.7* 7.3 8.2 10.8*  NEUTROABS 10.2* 6.2  --   --   HGB 10.4* 11.3* 10.2* 9.4*  HCT 38.4* 39.1 34.4* 32.5*  MCV 94.8 88.7 87.8 88.3  PLT 245 225 PLATELET CLUMPS NOTED ON SMEAR, COUNT APPEARS DECREASED PLATELET CLUMPS NOTED ON SMEAR, COUNT APPEARS DECREASED   Cardiac Enzymes: Recent Labs  Lab 12/09/2018 0638 11/12/2018 0924 12/01/2018 1422 11/28/18 1659 11/29/18 0425  CKTOTAL  --  990*  --   --   --   TROPONINI 0.05* 0.05* 0.11* 0.45* 0.22*   CBG: Recent Labs  Lab 11/29/18 1507 11/29/18 2005  Dec 13, 2018 0034 12-13-18 0424 December 13, 2018 0745  GLUCAP 119* 125* 134* 139* 144*    Iron Studies: No results for input(s): IRON, TIBC, TRANSFERRIN, FERRITIN in the last 72 hours. Studies/Results: Ct Abdomen Wo Contrast  Result Date: 11/28/2018 CLINICAL DATA:  Inpatient. Abdominal distension. Nausea and vomiting. Intubated. Acute kidney injury. EXAM: CT ABDOMEN WITHOUT CONTRAST TECHNIQUE: Multidetector CT imaging of the abdomen was performed following the standard protocol without IV contrast. COMPARISON:  11/18/2018 right upper quadrant abdominal and renal sonogram. FINDINGS: Lower chest: There is patchy consolidation and ground-glass opacity throughout both lung bases, most prominent in the dependent right lower lobe with air bronchograms. Hepatobiliary: Normal liver with no liver mass. Mildly dilated gallbladder. No radiopaque cholelithiasis. No gallbladder wall thickening or pericholecystic fluid. No biliary ductal dilatation. Pancreas: Normal, with no  mass or duct dilation. Spleen: Normal size. No mass. Adrenals/Urinary Tract: Normal adrenals. No renal stones. No hydronephrosis. No contour deforming renal masses. Stomach/Bowel: Enteric tube terminates in the distal stomach in the antrum. Stomach is mildly distended and fluid-filled with no definite gastric wall thickening. There is diffuse dilatation of the duodenum and jejunum up to 4.7 cm diameter. The distal small bowel is collapsed. There is a focal small bowel caliber transition in the anterior abdomen near the umbilicus (series 3/image 57). There is fecalization of the small bowel contents proximal to the caliber transition. Findings are compatible with a moderate mechanical mid small bowel obstruction, probably due to adhesions. No small bowel wall thickening. Normal appendix. There is a moderate right ventral abdominal hernia containing a portion of the proximal transverse colon. There is a mild large bowel caliber transition at the level of the ventral hernia. No definite large bowel wall thickening or significant pericolonic fat stranding. Vascular/Lymphatic: Atherosclerotic nonaneurysmal abdominal aorta. Left common femoral central venous catheter terminates in the left common iliac vein. No pathologically enlarged lymph nodes in the abdomen. Other: No pneumoperitoneum, ascites or focal fluid collection. Musculoskeletal: No aggressive appearing focal osseous lesions. Mild thoracolumbar spondylosis. IMPRESSION: 1. CT findings are compatible with a moderate mechanical mid small bowel obstruction with focal caliber transition in the anterior abdomen near the umbilicus. Small bowel obstruction probably due to adhesions. 2. Moderate right ventral abdominal hernia containing a portion of the proximal transverse colon, with mild caliber transition in the colon at the level of the hernia, cannot exclude a separate colonic obstruction at this level. 3. No free air or abscess. 4. Patchy consolidation and  ground-glass opacity at both lung bases, most prominent at the dependent right lung base with air bronchograms, suspicious for multilobar pneumonia or aspiration. 5.  Aortic Atherosclerosis (ICD10-I70.0). Electronically Signed   By: Ilona Sorrel M.D.   On: 11/28/2018 21:05   Dg Chest Port 1 View  Result Date: 12/13/18 CLINICAL DATA:  Abnormal respirations EXAM: PORTABLE CHEST 1 VIEW COMPARISON:  11/29/2018 FINDINGS: Endotracheal tube, left central line and NG tube are in place, unchanged. Cardiomegaly with vascular congestion. Bilateral perihilar and lower lobe opacities are stable since prior study. This could reflect edema or infection. No acute bony abnormality. IMPRESSION: Bilateral perihilar and lower lobe opacities could reflect edema or infection. No change. Electronically Signed   By: Rolm Baptise M.D.   On: December 13, 2018 07:12   Dg Chest Port 1 View  Result Date: 11/29/2018 CLINICAL DATA:  Check endotracheal tube placement EXAM: PORTABLE CHEST 1 VIEW COMPARISON:  11/29/2018 FINDINGS: Endotracheal tube is been advanced and now lies at the level of the aortic knob. Left jugular central line  is noted in the mid superior vena cava. Nasogastric catheter extends into the stomach. The lungs are well aerated bilaterally with bibasilar opacities consistent with infiltrate. Calcified granulomas are again noted bilaterally. No bony abnormality is seen. IMPRESSION: Tubes and lines as described. Bibasilar infiltrative density. Electronically Signed   By: Inez Catalina M.D.   On: 11/29/2018 11:38   Dg Chest Port 1 View  Result Date: 11/29/2018 CLINICAL DATA:  Chest tube placement. EXAM: PORTABLE CHEST 1 VIEW COMPARISON:  Radiograph of November 28, 2018. FINDINGS: Stable cardiomediastinal silhouette. Endotracheal and nasogastric tubes are unchanged in position. Left internal jugular catheter is noted with tip in expected position of the SVC. No pneumothorax is noted. Bibasilar atelectasis or infiltrates are  noted with associated pleural effusions. Bony thorax is unremarkable. IMPRESSION: Stable support apparatus. Stable bibasilar atelectasis or infiltrates are noted with associated pleural effusions. Electronically Signed   By: Marijo Conception, M.D.   On: 11/29/2018 08:47   Medications: Infusions: .  prismasol BGK 4/2.5 300 mL/hr at 12-16-2018 0801  . sodium chloride 10 mL/hr at Dec 16, 2018 0800  . aztreonam Stopped (11/29/18 2144)  . epinephrine Stopped (11/28/18 2340)  . famotidine (PEPCID) IV 20 mg (12/16/18 0800)  . fentaNYL infusion INTRAVENOUS 100 mcg/hr (Dec 16, 2018 0800)  . heparin 10,000 units/ 20 mL infusion syringe 1,350 Units/hr (2018-12-16 0605)  . heparin 999 mL/hr at 11/28/18 2233  . metronidazole Stopped (December 16, 2018 0327)  . norepinephrine (LEVOPHED) Adult infusion 22 mcg/min (Dec 16, 2018 0800)  . phenylephrine (NEO-SYNEPHRINE) Adult infusion Stopped (12-16-18 0447)  . prismasol BGK 0/2.5 300 mL/hr at 16-Dec-2018 0329  . prismasol BGK 4/2.5 1,500 mL/hr at 16-Dec-2018 0747  . vancomycin Stopped (12-16-18 6720)  . vasopressin (PITRESSIN) infusion - *FOR SHOCK* 0.03 Units/min (12/16/2018 0800)    Scheduled Medications: . arformoterol  15 mcg Nebulization BID  . budesonide (PULMICORT) nebulizer solution  0.5 mg Nebulization BID  . chlorhexidine gluconate (MEDLINE KIT)  15 mL Mouth Rinse BID  . hydrocortisone sod succinate (SOLU-CORTEF) inj  50 mg Intravenous Q6H  . levothyroxine  100 mcg Intravenous Daily  . mouth rinse  15 mL Mouth Rinse 10 times per day  . pantoprazole (PROTONIX) IV  40 mg Intravenous Q12H    have reviewed scheduled and prn medications.  Physical Exam: General: sedated on vent  Heart: RRR Lungs: poor air movement- CBS bilat  Abdomen: hernia- distended Extremities: some pitting edema Dialysis Access: left femoral vascath placed 12/17     Dec 16, 2018,8:16 AM  LOS: 3 days

## 2018-12-13 DEATH — deceased

## 2019-01-16 IMAGING — DX DG CHEST 1V PORT
1 series · 2 of 2 positions shown · non-contrast
Comparison: November 27, 2018

CLINICAL DATA: Hypoxia

EXAM:
PORTABLE CHEST 1 VIEW

[Series 1: chest · 0.14mm/px · 2 of 2 slices shown]
[im 1/2]
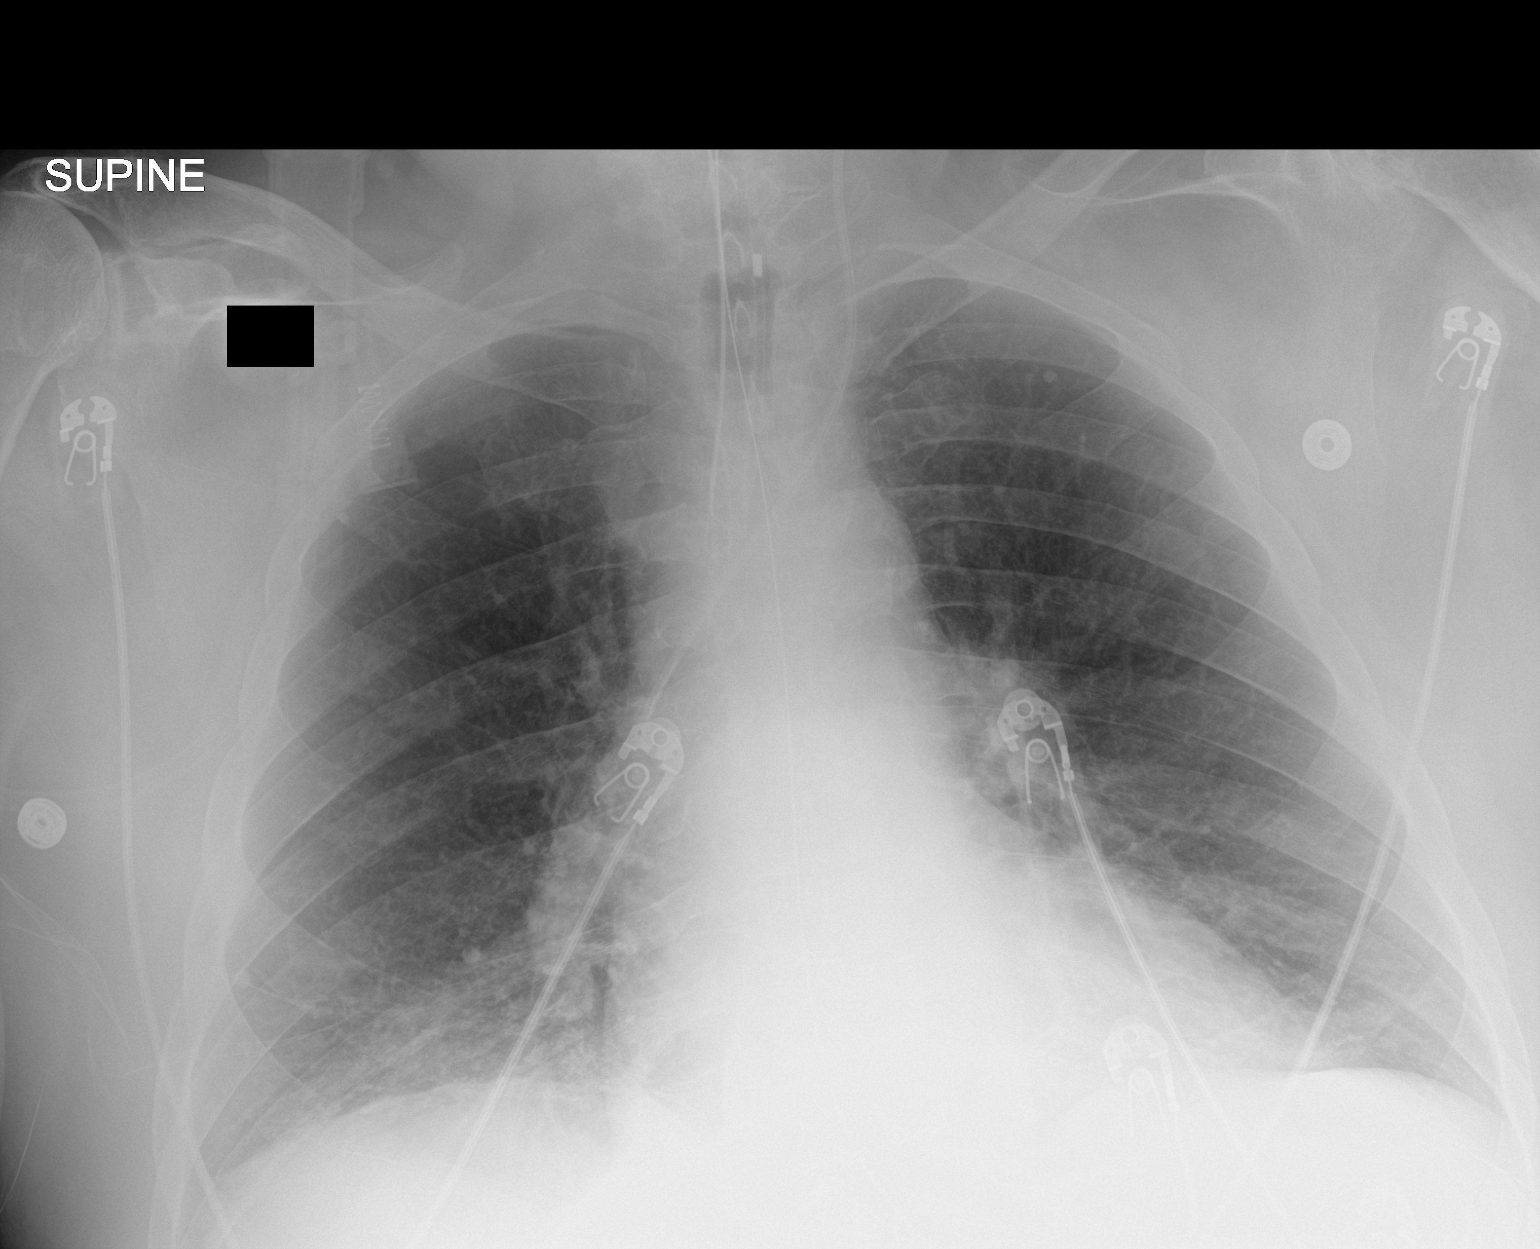
[im 2/2]
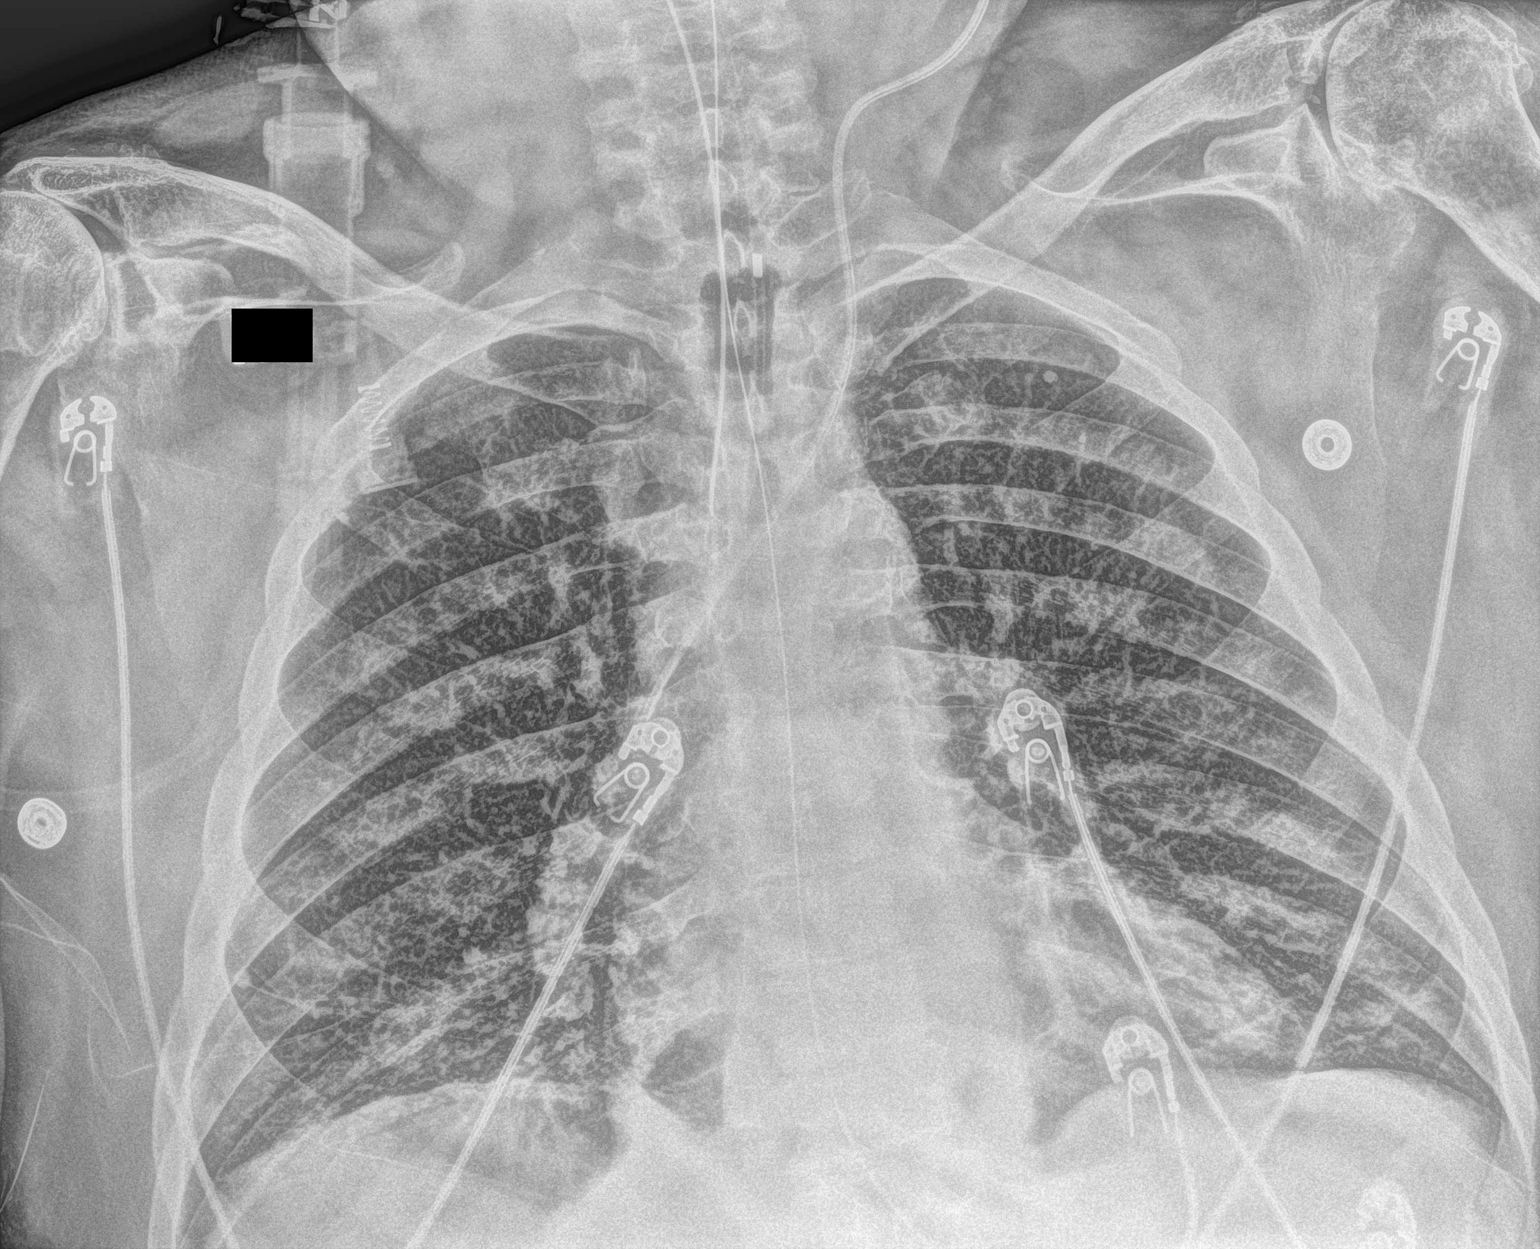

[2 of 2 positions shown; findings below may reference images not displayed]

FINDINGS: Endotracheal tube tip is 4.4 cm above the carina. Central catheter
tip is in the superior vena cava. Nasogastric tube tip and side port
are below the diaphragm. No pneumothorax. There is atelectatic
change in the lung bases. There is a small calcified granuloma in
the left upper lobe. The lungs elsewhere are clear. Heart size and
pulmonary vascularity are normal. There is aortic atherosclerosis.
No bone lesions.
IMPRESSION: Tube and catheter positions as described without pneumothorax.
Bibasilar atelectasis. Small calcified granuloma left upper lobe.
Stable cardiac silhouette.

## 2019-01-16 IMAGING — DX DG CHEST 1V PORT
1 series · 1 of 1 positions shown · non-contrast
Comparison: Radiograph 04/03/2018

CLINICAL DATA: Respiratory distress. Shortness of breath.

EXAM:
PORTABLE CHEST 1 VIEW

[chest ap]
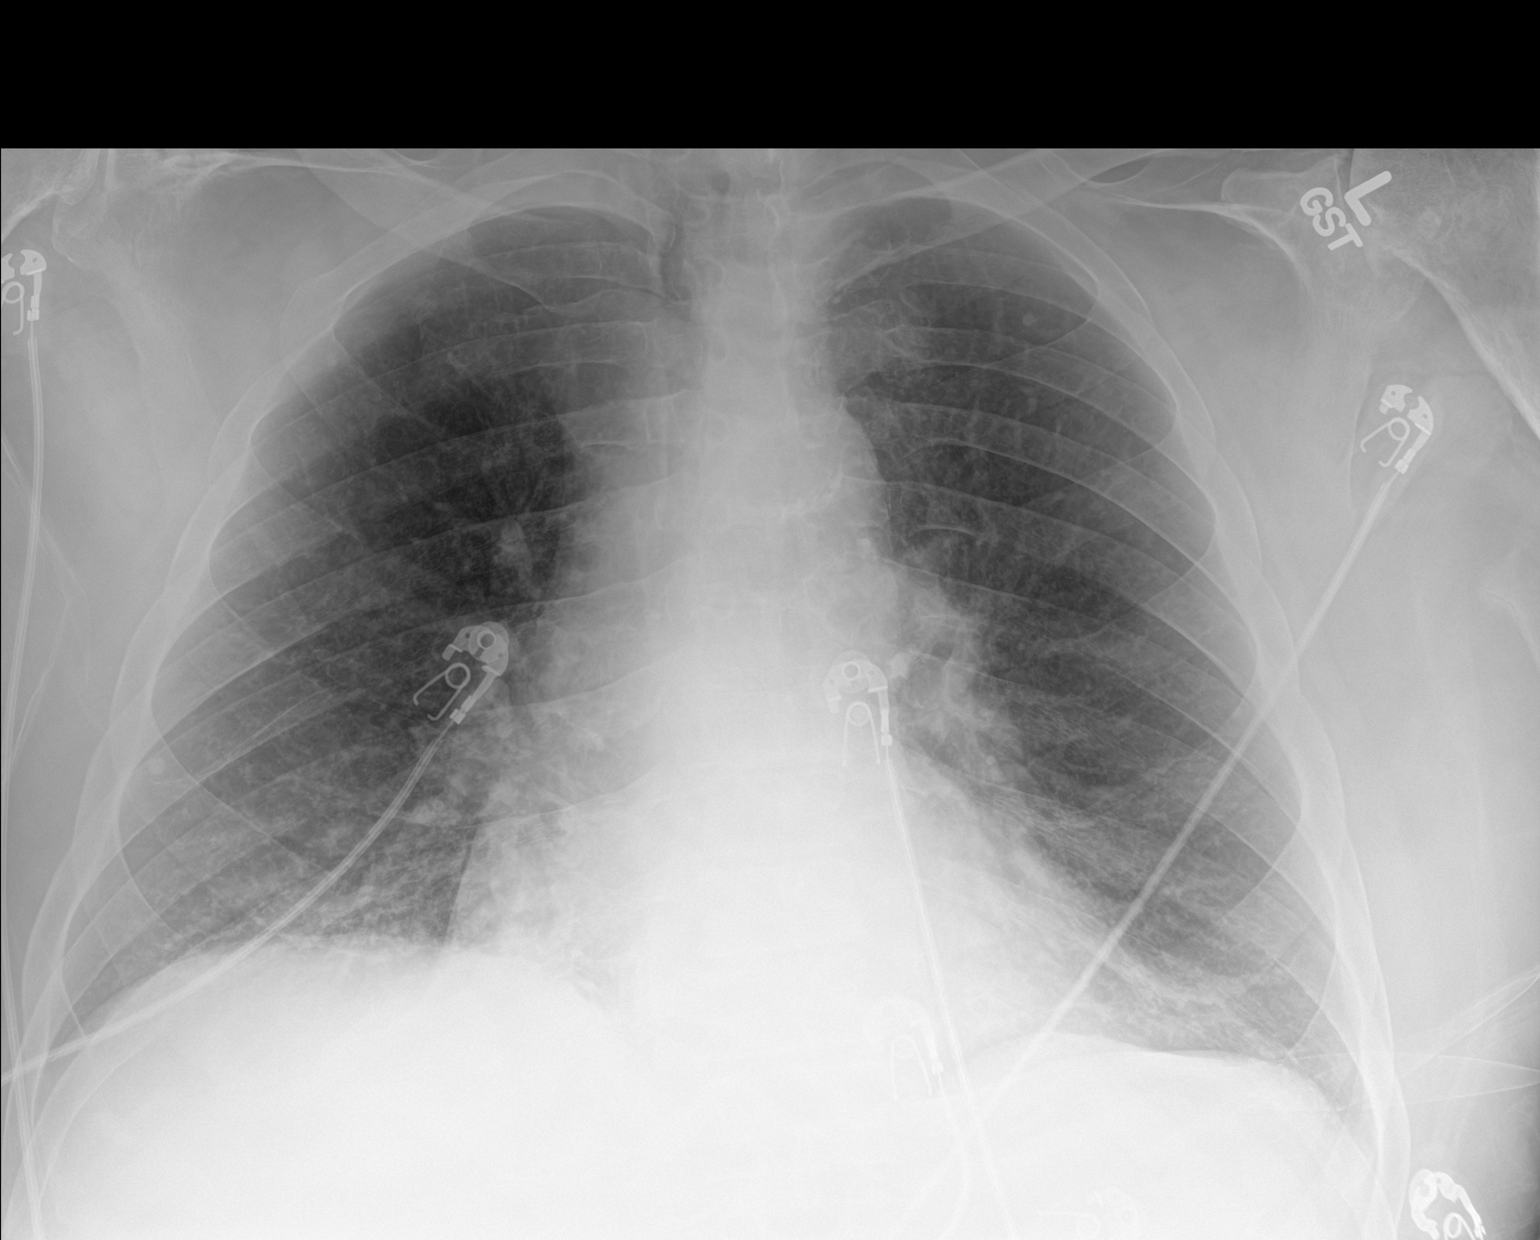

[1 of 1 positions shown; findings below may reference images not displayed]

FINDINGS: Unchanged cardiomegaly and aortic atherosclerosis. Emphysema.
Increased peribronchial thickening from prior exam. Left lung base
scarring. Mild right infrahilar atelectasis without confluent
airspace disease. Scattered calcified granuloma. No large pleural
effusion or pneumothorax. Chronic change of both shoulders, worse on
the left.
IMPRESSION: 1. Increased peribronchial thickening from prior exam may be
bronchitis or pulmonary edema.
2. Unchanged cardiomegaly and aortic atherosclerosis.

## 2019-01-19 IMAGING — DX DG ABD PORTABLE 1V
2 series · 2 of 2 positions shown · non-contrast
Comparison: CT abdomen and pelvis November 28, 2018

CLINICAL DATA: Abdominal pain with recent bowel obstruction.

EXAM:
PORTABLE ABDOMEN - 1 VIEW

[abdomen kub (1 of 2)]
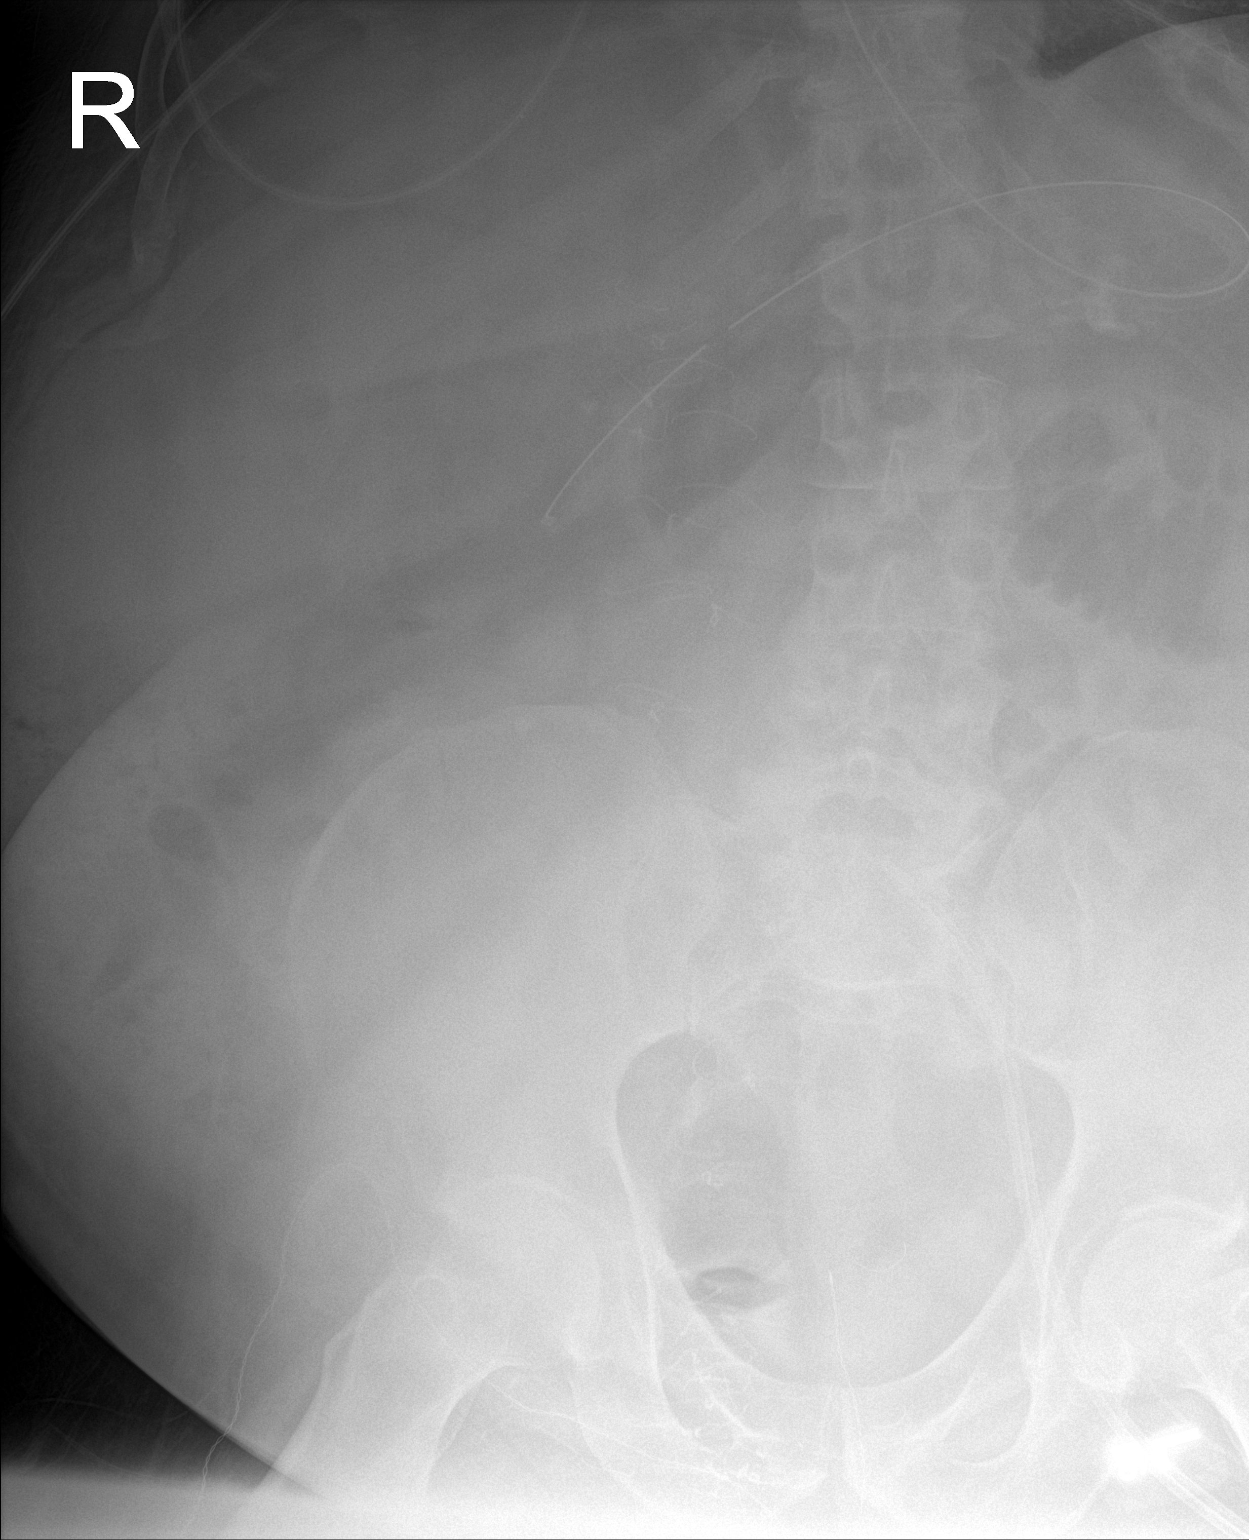

[abdomen kub (2 of 2)]
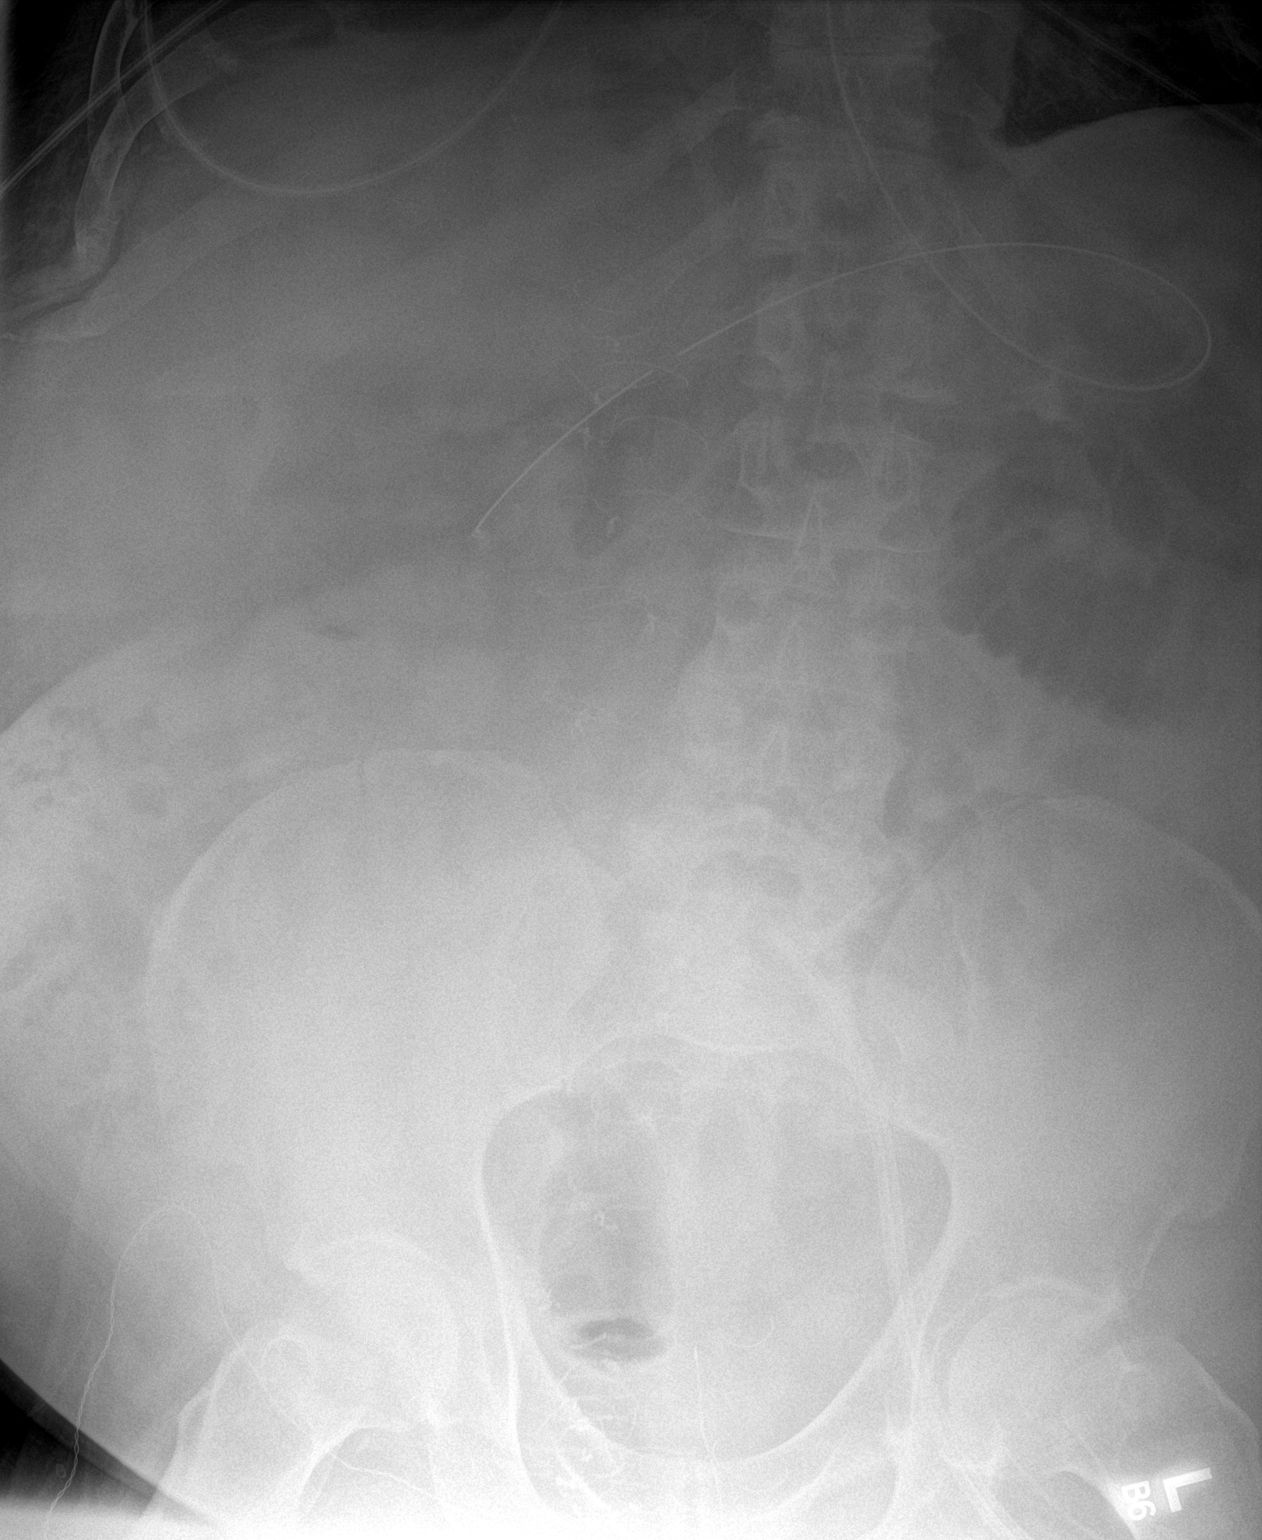

[2 of 2 positions shown; findings below may reference images not displayed]

FINDINGS: Nasogastric tube tip and side port are in the distal stomach. There
is a single loop of dilated small bowel in the left mid abdomen. No
other bowel dilatation. No evident free air. There is moderate stool
in the colon. There are foci of previous surgery on the right.
IMPRESSION: Nasogastric tube tip and side port in distal stomach. Single loop of
mildly dilated small bowel. Bowel loops elsewhere appear
unremarkable. Suspect resolving bowel obstruction. No evident free
air.

## 2019-02-21 ENCOUNTER — Ambulatory Visit: Payer: Medicare HMO | Admitting: Internal Medicine

## 2020-05-13 IMAGING — US US RENAL
1 series · 14 of 23 positions shown · non-contrast
Comparison: None.

CLINICAL DATA: Acute renal failure.

EXAM:
RENAL / URINARY TRACT ULTRASOUND COMPLETE

[Series 1: us renal · 0.31mm/px · 14 of 23 slices shown]
[im 1/23]
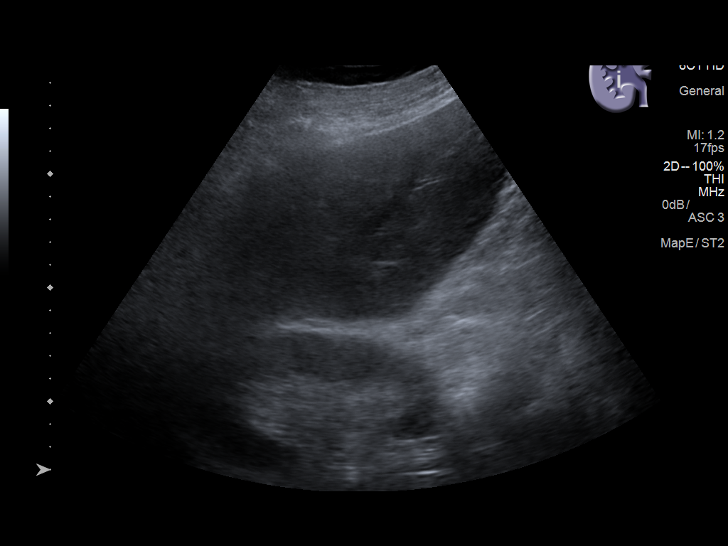
[im 3/23]
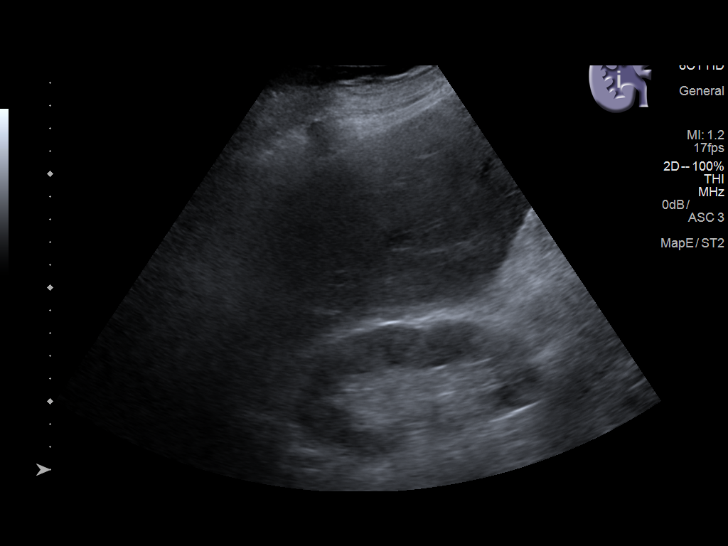
[im 5/23]
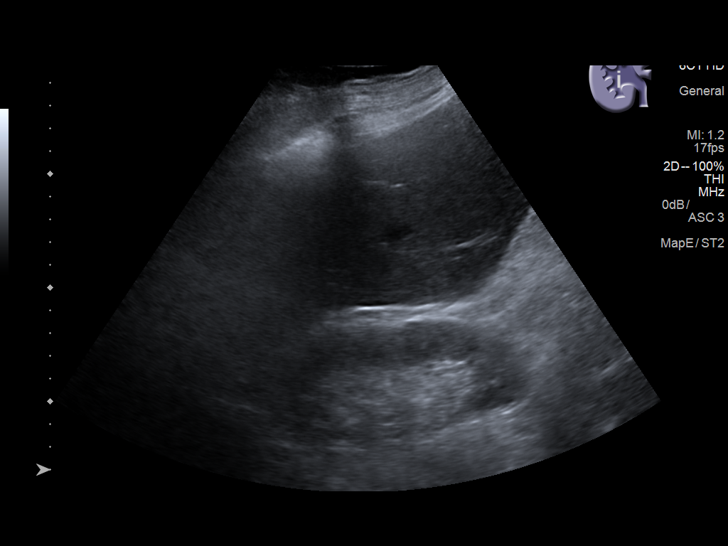
[im 6/23]
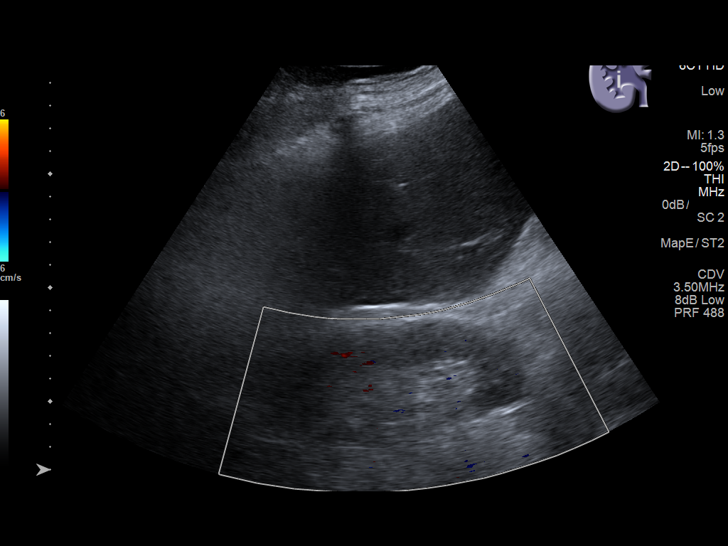
[im 8/23]
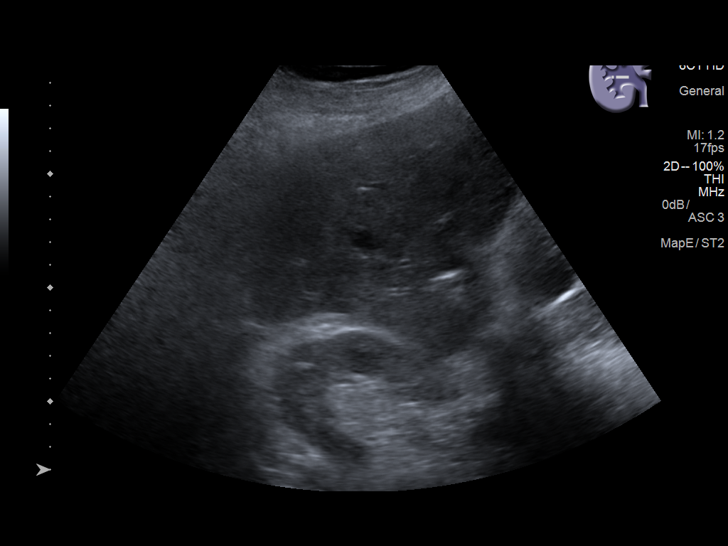
[im 10/23]
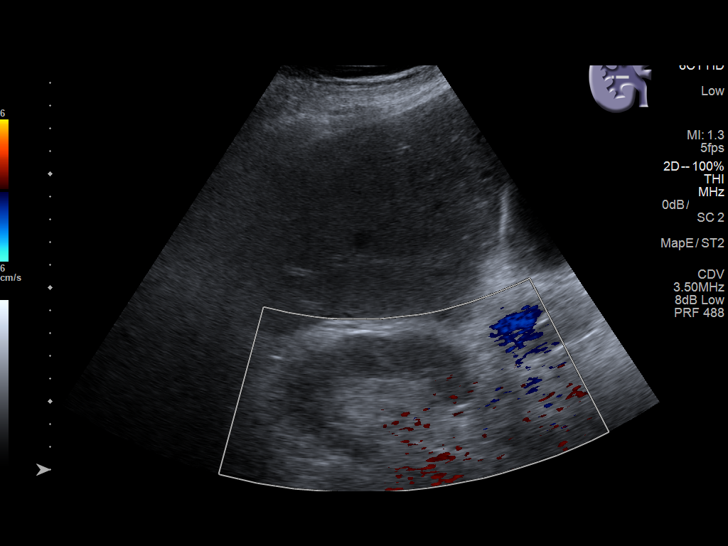
[im 11/23]
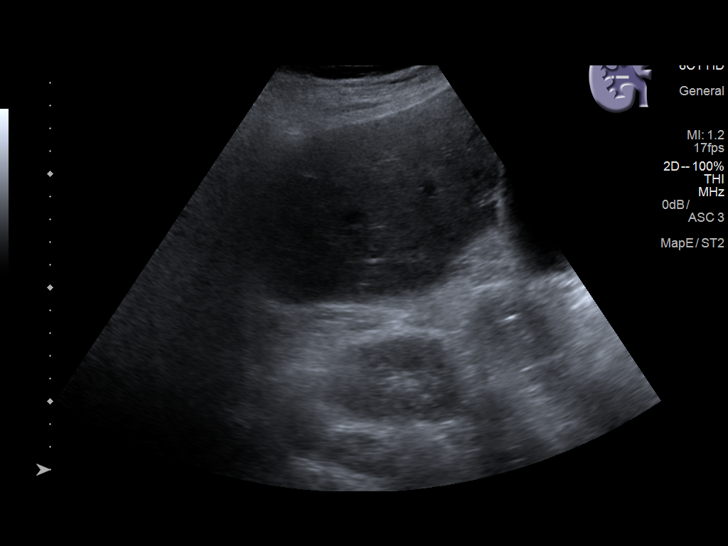
[im 13/23]
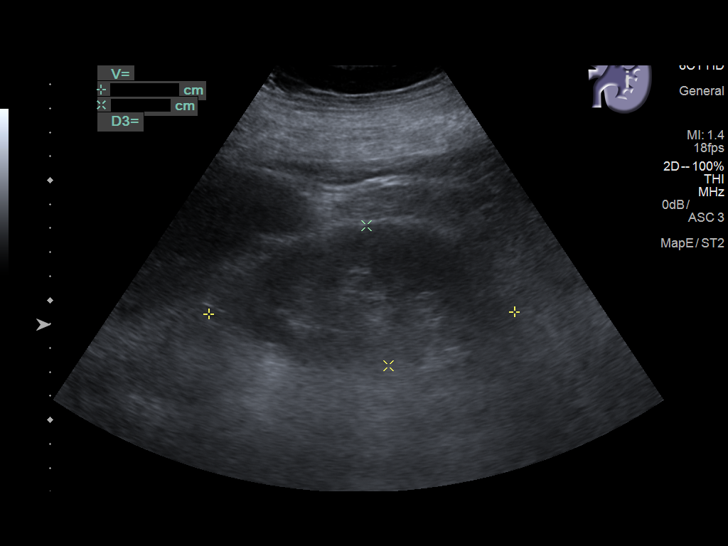
[im 14/23]
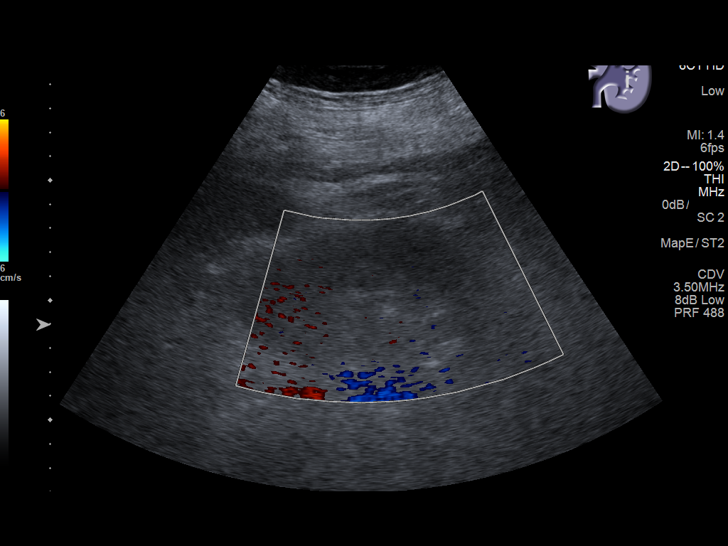
[im 16/23]
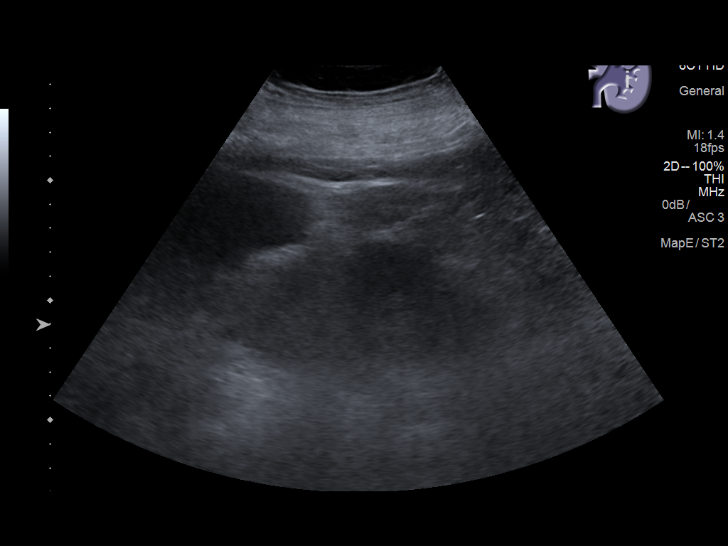
[im 18/23]
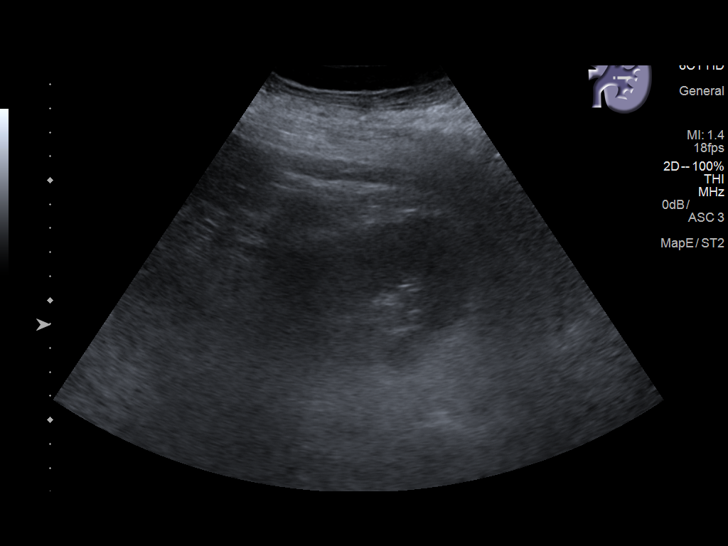
[im 19/23]
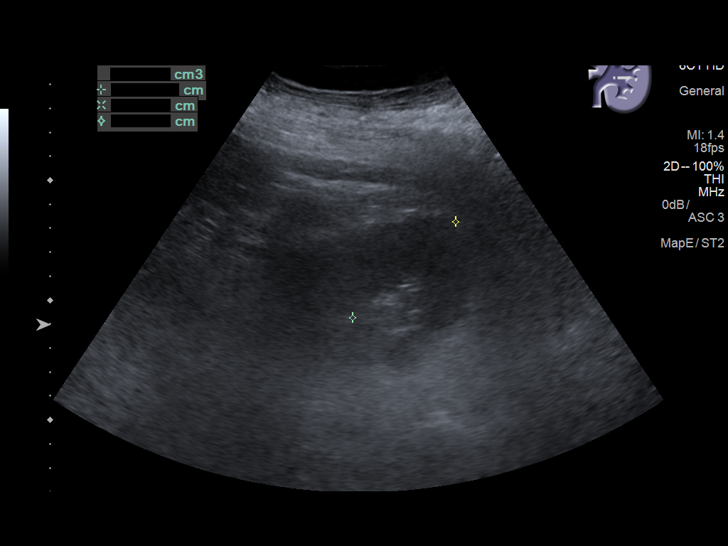
[im 21/23]
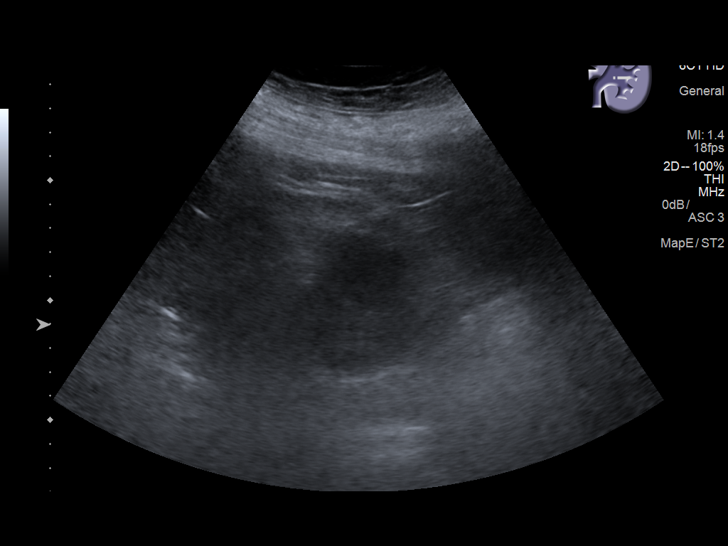
[im 23/23]
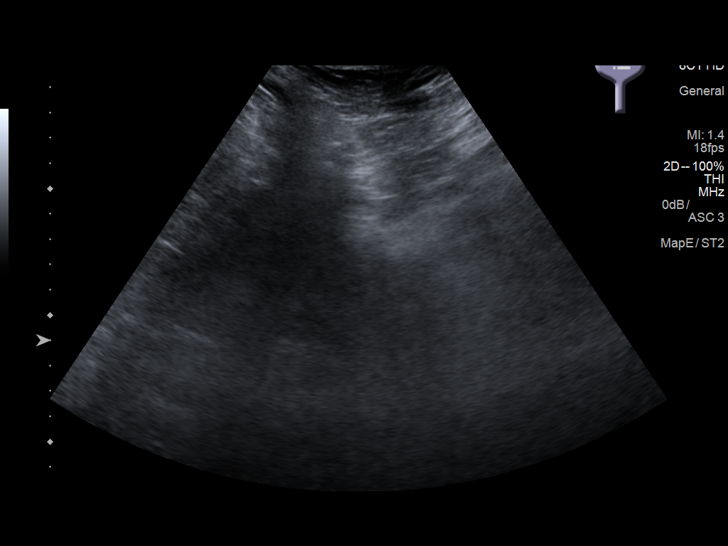

[14 of 23 positions shown; findings below may reference images not displayed]

FINDINGS: Right Kidney:

Renal measurements: 11.8 x 6.2 x 5 1 cm = volume: 195 mL. Minimally
echogenic. No mass or hydronephrosis visualized.

Left Kidney:

Renal measurements: 12.8 x 5.9 x 5.9 cm = volume: 232 mL. Minimally
echogenic. No mass or hydronephrosis visualized.

Bladder:

Not visualized with a Foley catheter in place.
IMPRESSION: 1. Minimally echogenic kidneys compatible with minimal medical renal
disease.
2. No hydronephrosis.
